# Patient Record
Sex: Male | Born: 1960
Health system: Southern US, Community
[De-identification: ages and names within clinical notes are randomized; demographics above are authoritative.]

## PROBLEM LIST (undated history)

## (undated) DIAGNOSIS — C801 Malignant (primary) neoplasm, unspecified: Secondary | ICD-10-CM

## (undated) DIAGNOSIS — I82409 Acute embolism and thrombosis of unspecified deep veins of unspecified lower extremity: Secondary | ICD-10-CM

## (undated) DIAGNOSIS — Z86718 Personal history of other venous thrombosis and embolism: Secondary | ICD-10-CM

## (undated) DIAGNOSIS — I10 Essential (primary) hypertension: Secondary | ICD-10-CM

## (undated) DIAGNOSIS — K921 Melena: Secondary | ICD-10-CM

## (undated) DIAGNOSIS — K259 Gastric ulcer, unspecified as acute or chronic, without hemorrhage or perforation: Secondary | ICD-10-CM

## (undated) DIAGNOSIS — K922 Gastrointestinal hemorrhage, unspecified: Secondary | ICD-10-CM

## (undated) HISTORY — DX: Personal history of other venous thrombosis and embolism: Z86.718

## (undated) HISTORY — DX: Gastrointestinal hemorrhage, unspecified: K92.2

---

## 1999-05-15 DIAGNOSIS — K922 Gastrointestinal hemorrhage, unspecified: Secondary | ICD-10-CM

## 1999-05-15 HISTORY — DX: Gastrointestinal hemorrhage, unspecified: K92.2

## 2002-08-08 ENCOUNTER — Encounter: Payer: Self-pay | Admitting: Emergency Medicine

## 2002-08-09 ENCOUNTER — Inpatient Hospital Stay (HOSPITAL_COMMUNITY): Admission: EM | Admit: 2002-08-09 | Discharge: 2002-08-12 | Payer: Self-pay | Admitting: Emergency Medicine

## 2002-08-10 ENCOUNTER — Encounter: Payer: Self-pay | Admitting: Internal Medicine

## 2002-08-11 ENCOUNTER — Encounter: Payer: Self-pay | Admitting: Family Medicine

## 2002-08-12 ENCOUNTER — Encounter: Payer: Self-pay | Admitting: Family Medicine

## 2002-08-27 ENCOUNTER — Encounter: Admission: RE | Admit: 2002-08-27 | Discharge: 2002-08-27 | Payer: Self-pay | Admitting: Neurology

## 2002-08-27 ENCOUNTER — Encounter: Payer: Self-pay | Admitting: Neurology

## 2009-05-14 DIAGNOSIS — I82409 Acute embolism and thrombosis of unspecified deep veins of unspecified lower extremity: Secondary | ICD-10-CM

## 2009-05-14 HISTORY — DX: Acute embolism and thrombosis of unspecified deep veins of unspecified lower extremity: I82.409

## 2009-11-18 ENCOUNTER — Ambulatory Visit: Payer: Self-pay | Admitting: Surgery

## 2009-11-18 ENCOUNTER — Ambulatory Visit: Payer: Self-pay | Admitting: Family Medicine

## 2009-11-18 ENCOUNTER — Inpatient Hospital Stay (HOSPITAL_COMMUNITY): Admission: EM | Admit: 2009-11-18 | Discharge: 2009-11-19 | Payer: Self-pay | Admitting: Emergency Medicine

## 2009-11-18 ENCOUNTER — Encounter (INDEPENDENT_AMBULATORY_CARE_PROVIDER_SITE_OTHER): Payer: Self-pay | Admitting: Family Medicine

## 2009-11-25 ENCOUNTER — Ambulatory Visit: Payer: Self-pay | Admitting: Family Medicine

## 2009-11-28 ENCOUNTER — Ambulatory Visit: Payer: Self-pay | Admitting: Family Medicine

## 2009-12-01 ENCOUNTER — Ambulatory Visit: Payer: Self-pay | Admitting: Family Medicine

## 2009-12-05 ENCOUNTER — Ambulatory Visit: Payer: Self-pay | Admitting: Family Medicine

## 2009-12-08 ENCOUNTER — Ambulatory Visit: Payer: Self-pay | Admitting: Internal Medicine

## 2009-12-12 ENCOUNTER — Ambulatory Visit: Payer: Self-pay | Admitting: Family Medicine

## 2009-12-19 ENCOUNTER — Ambulatory Visit: Payer: Self-pay | Admitting: Family Medicine

## 2009-12-26 ENCOUNTER — Ambulatory Visit: Payer: Self-pay | Admitting: Family Medicine

## 2010-01-05 ENCOUNTER — Ambulatory Visit: Payer: Self-pay | Admitting: Family Medicine

## 2010-01-19 ENCOUNTER — Ambulatory Visit: Payer: Self-pay | Admitting: Family Medicine

## 2010-01-19 LAB — CONVERTED CEMR LAB
INR: 2.49 — ABNORMAL HIGH (ref <1.50)
Prothrombin Time: 27 s — ABNORMAL HIGH (ref 11.6–15.2)

## 2010-02-09 ENCOUNTER — Encounter (INDEPENDENT_AMBULATORY_CARE_PROVIDER_SITE_OTHER): Payer: Self-pay | Admitting: Family Medicine

## 2010-02-09 LAB — CONVERTED CEMR LAB
INR: 2.81 — ABNORMAL HIGH (ref <1.50)
Prothrombin Time: 29.7 s — ABNORMAL HIGH (ref 11.6–15.2)

## 2010-03-09 ENCOUNTER — Encounter (INDEPENDENT_AMBULATORY_CARE_PROVIDER_SITE_OTHER): Payer: Self-pay | Admitting: Family Medicine

## 2010-03-09 LAB — CONVERTED CEMR LAB
INR: 2.57 — ABNORMAL HIGH (ref <1.50)
Prothrombin Time: 27.7 s — ABNORMAL HIGH (ref 11.6–15.2)

## 2010-04-12 ENCOUNTER — Inpatient Hospital Stay (HOSPITAL_COMMUNITY)
Admission: EM | Admit: 2010-04-12 | Discharge: 2010-04-14 | Payer: Self-pay | Source: Home / Self Care | Admitting: Emergency Medicine

## 2010-04-12 ENCOUNTER — Emergency Department (HOSPITAL_COMMUNITY)
Admission: EM | Admit: 2010-04-12 | Discharge: 2010-04-12 | Disposition: A | Discharge status: Other Acute Inpatient Hospital | Payer: Self-pay | Source: Home / Self Care | Admitting: Family Medicine

## 2010-07-11 ENCOUNTER — Encounter (INDEPENDENT_AMBULATORY_CARE_PROVIDER_SITE_OTHER): Payer: Self-pay | Admitting: Family Medicine

## 2010-07-11 LAB — CONVERTED CEMR LAB
ALT: 11 units/L (ref 0–53)
Albumin: 4.2 g/dL (ref 3.5–5.2)
Basophils Absolute: 0 10*3/uL (ref 0.0–0.1)
CO2: 26 meq/L (ref 19–32)
Chloride: 100 meq/L (ref 96–112)
Cholesterol: 207 mg/dL — ABNORMAL HIGH (ref 0–200)
Eosinophils Relative: 2 % (ref 0–5)
Lymphocytes Relative: 34 % (ref 12–46)
Lymphs Abs: 3.4 10*3/uL (ref 0.7–4.0)
Neutro Abs: 5.8 10*3/uL (ref 1.7–7.7)
Neutrophils Relative %: 57 % (ref 43–77)
Platelets: 383 10*3/uL (ref 150–400)
Potassium: 4.8 meq/L (ref 3.5–5.3)
RBC: 6.04 M/uL — ABNORMAL HIGH (ref 4.22–5.81)
RDW: 17.9 % — ABNORMAL HIGH (ref 11.5–15.5)
Sodium: 138 meq/L (ref 135–145)
Total Bilirubin: 0.4 mg/dL (ref 0.3–1.2)
Total Protein: 7.7 g/dL (ref 6.0–8.3)
VLDL: 17 mg/dL (ref 0–40)
WBC: 10.1 10*3/uL (ref 4.0–10.5)

## 2010-07-24 LAB — COMPREHENSIVE METABOLIC PANEL
ALT: 34 U/L (ref 0–53)
AST: 68 U/L — ABNORMAL HIGH (ref 0–37)
Albumin: 3.7 g/dL (ref 3.5–5.2)
CO2: 26 mEq/L (ref 19–32)
Calcium: 9.3 mg/dL (ref 8.4–10.5)
Chloride: 101 mEq/L (ref 96–112)
GFR calc Af Amer: >60 mL/min (ref >60)
GFR calc non Af Amer: >60 mL/min (ref >60)
Sodium: 137 mEq/L (ref 135–145)

## 2010-07-24 LAB — DIFFERENTIAL
Basophils Relative: 0 % (ref 0–1)
Eosinophils Absolute: 0.1 10*3/uL (ref 0.0–0.7)
Eosinophils Relative: 2 % (ref 0–5)
Lymphocytes Relative: 40 % (ref 12–46)
Neutro Abs: 3.8 10*3/uL (ref 1.7–7.7)

## 2010-07-24 LAB — CBC
Hemoglobin: 9.4 g/dL — ABNORMAL LOW (ref 13.0–17.0)
MCHC: 35.6 g/dL (ref 30.0–36.0)
Platelets: 140 10*3/uL — ABNORMAL LOW (ref 150–400)
RBC: 2.91 MIL/uL — ABNORMAL LOW (ref 4.22–5.81)

## 2010-07-24 LAB — PROTIME-INR
INR: 2.01 — ABNORMAL HIGH (ref 0.00–1.49)
Prothrombin Time: 22.9 seconds — ABNORMAL HIGH (ref 11.6–15.2)

## 2010-07-24 LAB — PREPARE RBC (CROSSMATCH)

## 2010-07-24 LAB — TSH: TSH: 1.348 u[IU]/mL (ref 0.350–4.500)

## 2010-07-25 LAB — DIFFERENTIAL
Basophils Absolute: 0 10*3/uL (ref 0.0–0.1)
Basophils Relative: 0 % (ref 0–1)
Eosinophils Absolute: 0.1 10*3/uL (ref 0.0–0.7)
Lymphocytes Relative: 46 % (ref 12–46)
Monocytes Relative: 3 % (ref 3–12)
Neutro Abs: 3.4 10*3/uL (ref 1.7–7.7)
Neutrophils Relative %: 50 % (ref 43–77)

## 2010-07-25 LAB — TYPE AND SCREEN
Antibody Screen: NEGATIVE
Unit division: 0

## 2010-07-25 LAB — BASIC METABOLIC PANEL
Chloride: 106 mEq/L (ref 96–112)
GFR calc Af Amer: >60 mL/min (ref >60)
GFR calc non Af Amer: >60 mL/min (ref >60)
Potassium: 3.8 mEq/L (ref 3.5–5.1)
Sodium: 135 mEq/L (ref 135–145)

## 2010-07-25 LAB — FOLATE: Folate: 18.6 ng/mL

## 2010-07-25 LAB — CBC
HCT: 15.8 % — ABNORMAL LOW (ref 39.0–52.0)
MCV: 102.6 fL — ABNORMAL HIGH (ref 78.0–100.0)
RBC: 1.54 MIL/uL — ABNORMAL LOW (ref 4.22–5.81)
RDW: 23.2 % — ABNORMAL HIGH (ref 11.5–15.5)
WBC: 6.8 10*3/uL (ref 4.0–10.5)

## 2010-07-25 LAB — RETICULOCYTES
RBC.: 1.54 MIL/uL — ABNORMAL LOW (ref 4.22–5.81)
Retic Count, Absolute: 4.6 10*3/uL — ABNORMAL LOW (ref 19.0–186.0)

## 2010-07-25 LAB — POCT I-STAT, CHEM 8
BUN: 10 mg/dL (ref 6–23)
Calcium, Ion: 1.19 mmol/L (ref 1.12–1.32)
Chloride: 105 mEq/L (ref 96–112)
HCT: 23 % — ABNORMAL LOW (ref 39.0–52.0)
Potassium: 4.4 mEq/L (ref 3.5–5.1)
Sodium: 139 mEq/L (ref 135–145)

## 2010-07-25 LAB — IRON AND TIBC
Saturation Ratios: 53 % (ref 20–55)
TIBC: 173 ug/dL — ABNORMAL LOW (ref 215–435)
UIBC: 81 ug/dL

## 2010-07-25 LAB — PROTIME-INR: Prothrombin Time: 29.7 seconds — ABNORMAL HIGH (ref 11.6–15.2)

## 2010-07-25 LAB — POCT CARDIAC MARKERS: Troponin i, poc: <0.05 ng/mL (ref 0.00–0.09)

## 2010-07-25 LAB — HEMOCCULT GUIAC POC 1CARD (OFFICE): Fecal Occult Bld: NEGATIVE

## 2010-07-25 LAB — VITAMIN B12: Vitamin B-12: 51 pg/mL — ABNORMAL LOW (ref 211–911)

## 2010-07-30 LAB — DIFFERENTIAL
Basophils Absolute: 0 10*3/uL (ref 0.0–0.1)
Basophils Absolute: 0 10*3/uL (ref 0.0–0.1)
Basophils Relative: 0 % (ref 0–1)
Basophils Relative: 0 % (ref 0–1)
Eosinophils Absolute: 0.4 10*3/uL (ref 0.0–0.7)
Eosinophils Relative: 5 % (ref 0–5)
Lymphocytes Relative: 37 % (ref 12–46)
Lymphocytes Relative: 39 % (ref 12–46)
Monocytes Absolute: 0.2 10*3/uL (ref 0.1–1.0)
Neutrophils Relative %: 56 % (ref 43–77)

## 2010-07-30 LAB — CBC
HCT: 30.3 % — ABNORMAL LOW (ref 39.0–52.0)
Hemoglobin: 10 g/dL — ABNORMAL LOW (ref 13.0–17.0)
Platelets: 207 10*3/uL (ref 150–400)
RBC: 2.42 MIL/uL — ABNORMAL LOW (ref 4.22–5.81)
RBC: 2.42 MIL/uL — ABNORMAL LOW (ref 4.22–5.81)
RDW: 20.3 % — ABNORMAL HIGH (ref 11.5–15.5)
WBC: 9.1 10*3/uL (ref 4.0–10.5)
WBC: 9.1 10*3/uL (ref 4.0–10.5)

## 2010-07-30 LAB — BASIC METABOLIC PANEL
BUN: 5 mg/dL — ABNORMAL LOW (ref 6–23)
Chloride: 106 mEq/L (ref 96–112)
GFR calc Af Amer: >60 mL/min (ref >60)
GFR calc non Af Amer: >60 mL/min (ref >60)
Potassium: 3.8 mEq/L (ref 3.5–5.1)
Sodium: 141 mEq/L (ref 135–145)

## 2010-07-30 LAB — BETA-2-GLYCOPROTEIN I ABS, IGG/M/A
Beta-2 Glyco I IgG: 0 G Units (ref <20)
Beta-2-Glycoprotein I IgM: 13 M Units (ref <20)

## 2010-07-30 LAB — COMPREHENSIVE METABOLIC PANEL
AST: 19 U/L (ref 0–37)
Albumin: 3.7 g/dL (ref 3.5–5.2)
Alkaline Phosphatase: 50 U/L (ref 39–117)
Chloride: 101 mEq/L (ref 96–112)
GFR calc Af Amer: >60 mL/min (ref >60)
Potassium: 3.7 mEq/L (ref 3.5–5.1)
Total Bilirubin: 1.4 mg/dL — ABNORMAL HIGH (ref 0.3–1.2)

## 2010-07-30 LAB — LUPUS ANTICOAGULANT PANEL
DRVVT: 42.1 secs (ref 36.2–44.3)
PTT Lupus Anticoagulant: 45.1 secs (ref 30.0–45.6)

## 2010-07-30 LAB — PROTIME-INR
INR: 1.22 (ref 0.00–1.49)
Prothrombin Time: 15.1 seconds (ref 11.6–15.2)
Prothrombin Time: 15.3 seconds — ABNORMAL HIGH (ref 11.6–15.2)

## 2010-07-30 LAB — MAGNESIUM: Magnesium: 2.2 mg/dL (ref 1.5–2.5)

## 2010-07-30 LAB — THROMBIN TIME: Thrombin Time: 20 (ref 16–23)

## 2010-07-30 LAB — APTT: aPTT: 36 seconds (ref 24–37)

## 2010-07-30 LAB — PROTEIN C ACTIVITY: Protein C Activity: 61 % — ABNORMAL LOW (ref 75–133)

## 2010-09-29 NOTE — Consult Note (Signed)
Ryan Collins, Ryan Collins                           ACCOUNT NO.:  [id number]   MEDICAL RECORD NO.:  [id number]                   PATIENT TYPE:  INP   LOCATION:  5504                                 FACILITY:  [facility]   PHYSICIAN:  Genene Churn. Cyndie Chime, M.D.          DATE OF BIRTH:  Sep 09, 1960   DATE OF CONSULTATION:  08/11/2002  DATE OF DISCHARGE:                                   CONSULTATION   This is a hematology consultation to evaluate this 50 year old man for  severe macrocytic anemia.   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  He has history of heavy alcohol use drinking about six 40 ounce bottles of  beer daily until very recently.  He presented with a three-week history of  abdominal pain and paresthesias of his extremities.  Apparently, he was  prescribed ibuprofen 800 mg t.i.d. as an outpatient.  He then presented to  the emergency department with weakness and lightheadedness and was found to  have a severe anemia with hemoglobin of 4 with associated macrocytic indices  with MCV of 119.  White count normal at 6300 with 40% neutrophils, 58%  lymphocytes and platelet count moderately decreased at 97% done on 08/09/02.  He was admitted for further evaluation.   Additional testing done during this admission included upper endoscopy by  Dr. Yancey Flemings done 08/10/02 which was normal.  No evidence for atrophic  gastritis.   He was transfused up to a stable hemoglobin.  Additional laboratory testing  was done and revealed B12 deficiency with a B12 level of 91 (211/911) with a  normal folic acid of 95.6 (normal greater than 5.4).  Iron studies with  serum iron of 147, ferratin of 368.  Urinalysis showed no blood or bilirubin  and no protein.  Blood type is A RH positive.  Antibody screen was negative.  Hepatitis B and C were not detected.  Blood chemistry profile remarkable for  mild elevation of bilirubin at 1.7 with normal transaminases, SGOT 35,  SGPT  16 and oddly decreased alkaline phosphatase at 21 (this is usually up with  pernicious anemia).   An abdominal ultrasound was done which shows a normal size liver and spleen.   Review of the peripheral blood film reveals a normochromic macrocytic  anemia.  There are occasional hypersegmented neutrophils.  There is mild  thrombocytopenia.   PAST MEDICAL HISTORY:  Essentially unremarkable.  He has had previous  surgery on his left knee, tonsillectomy as a child.  No other medical or  surgical illness.  No chronic medications.   FAMILY HISTORY:  He has three brothers and three sisters and both his  parents who are all healthy.  No blood disorders in the family as far as he  is aware.   SOCIAL HISTORY:  He is not married.  Alcohol use as noted above.  He does  not smoke.  He  works in his Control and instrumentation engineer.  No exposure to  toxic chemicals.   PHYSICAL EXAMINATION:  GENERAL:  Pleasant African-American man in no  distress.  SKIN:  Hair and nails are normal.  HEENT:  Pupils equal, reactive to light. Pharynx without erythema or  exudate.  LUNGS:  Clear. Resonant to percussion.  CARDIAC:  Regular cardiac rhythm without murmur.  NECK:  No lymphadenopathy.  ABDOMEN:  Soft.  Nontender.  No mass.  No organomegaly.  EXTREMITIES:  No edema.  NEUROLOGIC:  Motor strength is 5/5.  I did not check coordination or  vibration.   IMPRESSION:  Pernicious anemia in a young man.   This is most likely related to poor dietary habits combined with significant  alcohol use.  No evidence of atrophic gastritis at time of endoscopy the  other day.   RECOMMENDATIONS:  B12 replacement.  I would give 1 mg IM daily x7 days,  weekly x1 month, then monthly on a chronic basis.   With better diet and decreased alcohol intake, he may be able to maintain  B12 levels with just oral vitamin supplementation. I will check for  antiparietal cell and anti-intrinsic factor antibodies to exclude an  immune  etiology for his pernicious anemia.   Thank you for this consultation.                                               Genene Churn. Cyndie Chime, M.D.    Lottie Rater  D:  08/11/2002  T:  08/11/2002  Job:  [id number]   cc:   Wilhemina Bonito. Marina Goodell, M.D. [facility]   Renaye Rakers, M.D.  [phone] [address], [address]  [place]  [place] [address]  Fax: [phone]

## 2010-09-29 NOTE — H&P (Signed)
NAMEJAMIEL, Ryan Collins                           ACCOUNT NO.:  [id number]   MEDICAL RECORD NO.:  [id number]                   PATIENT TYPE:  INP   LOCATION:  1824                                 FACILITY:  [facility]   PHYSICIAN:  Stanley C. Andrey Campanile, M.D.             DATE OF BIRTH:  1961-03-29   DATE OF ADMISSION:  08/08/2002  DATE OF DISCHARGE:                                HISTORY & PHYSICAL   PRESENTING HISTORY:  This 50 year old male was admitted through the  emergency room with a hemoglobin of 4.  He had already been evaluated and  started on his first unit of packed cells.  His history was that he had been  drinking two quarts or more of malt liquor per day up until the end of  January, at which time he stopped.  He had not been eating appropriately and  began having mid-back sharp pains for which he was taking [employer]ayer Back Pain.  When the pain persisted, he, three weeks ago, saw Dr. Bruna Potter because of  lightheadedness and some paresthesias in his hands.  He was given 800 mg of  ibuprofen three times a day, along with Meclizine.  Because of persistent  symptoms, he ended up, after going to an [facility], being sent to the  emergency room.  His BMET was normal.  His CK was normal.  Chest x-ray  showed some mild bronchitic changes and an EKG was unremarkable.   ALLERGIES:  None.   MEDICATIONS:  As above.   FAMILY HISTORY:  Noncontributory.   PAST MEDICAL HISTORY:  <HOSPITALIZATIONS AND OPERATIONS/>  Left lower leg fracture with surgery in 1990.   SOCIAL HISTORY:  No tobacco.  He does concrete work.  He is married and the  wife did confirm that he was using alcohol heavily until two months ago.   REVIEW OF SYSTEMS:  ENT:  No complaints.  CARDIORESPIRATORY:  No shortness  of breath or anterior chest pain.  GI:  He denies dark stools, melena or  blood in stools.  He has no focal GI symptoms, although apparently had been  having epigastric gas and decreased appetite for some time.   NEUROLOGIC:  No  focal neurologic history or headache.   PHYSICAL EXAMINATION:  VITAL SIGNS:  Temperature 100, pulse 120,  respirations 18, blood pressure 148/66.  GENERAL:  In general, he is alert, conversant and does not appear in acute  distress.  ENT:  Throat normal.  Pale conjunctivae.  NECK:  No neck masses, bruits or nodes.  CARDIORESPIRATORY:  There is a grade 1/6 right second intercostal space  systolic murmur which is non-transmitted.  LUNGS:  Lungs are clear.  No rales or rhonchi were noted.  ABDOMEN:  Abdomen soft and nontender.  Bowel sounds are normal.  No  organomegaly or masses.  RECTAL:  There is no stool in the vault.  Prostate is normal.  NEUROLOGIC:  No  focal abnormality.   IMPRESSION ON ADMISSION:  Profound anemia probably secondary to aspirin,  nonsteroidal anti-inflammatory use and alcohol.   PLAN:  Admit to telemetry, although he will be held in the emergency room  for his first two units of blood, which are packed cells.  He was somewhat  macrocytic, so we added MVI to his vitamin and will draw a B12 and folate.  He will require appropriate workup in the hospital.                                                Duffy Rhody C. Andrey Campanile, M.D.    SCW/MEDQ  D:  08/09/2002  T:  08/10/2002  Job:  [id number]

## 2010-09-29 NOTE — Discharge Summary (Signed)
Ryan Collins, Ryan Collins                           ACCOUNT NO.:  [id number]   MEDICAL RECORD NO.:  [id number]                   PATIENT TYPE:  INP   LOCATION:  5504                                 FACILITY:  [facility]   PHYSICIAN:  Stanley C. Andrey Campanile, M.D.             DATE OF BIRTH:  10-30-1960   DATE OF ADMISSION:  08/08/2002  DATE OF DISCHARGE:  08/12/2002                                 DISCHARGE SUMMARY   PRESENTING HISTORY:  The patient is a 50 year old male admitted to the  emergency room with an interesting history.  He had apparently been drinking  heavily for several months up until the end of January when he stopped using  alcohol.  He was drinking at least 2 quarts of malt liquor per day.  He had  terribly poor nutrition according to his wife and family and because of some  mid-upper thoracic T3-T5 back pain and dizziness saw Dr. Bruna Potter.  He was  placed on ibuprofen 800 mg three times daily and meclizine.  When the  symptoms persisted and with dizziness and fatigue, he presented to [facility] and was admitted in the middle of the night as an unassigned patient  when he was found to have a hemoglobin of 4.  He was admitted and transfused  with 4 units of packed cells over a 24-hour period.   PAST MEDICAL HISTORY:   ALLERGIES:  None.   MEDICATIONS:  Had been on ibuprofen 800 every 8 hours and meclizine.   FAMILY HISTORY:  Mother has high blood pressure and history of breast  cancer.  He has three brothers and three sisters who are alive and well.   PAST HOSPITALIZATIONS:  Left lower leg fracture with surgery approximately  10 years prior.   SOCIAL HISTORY:  No tobacco.  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  Alcohol history is detailed above.   REVIEW OF SYSTEMS PERTINENT POSITIVES:  Really nothing remarkable except the  fact that he denied tarry stools, melena, emesis, blood in stool or focal GI  symptoms.   PHYSICAL EXAMINATION ON ADMISSION:  VITAL SIGNS:  Blood  pressure 148/86,  pulse 120, respirations 18, temperature 100.  GENERAL:  He was alert and oriented in no acute distress, lying on the  stretcher.  ENT:  Muddy conjunctivae without other abnormalities.  CARDIORESPIRATORY:  Grade 1/6 right second intercostal systolic murmur  without radiation; no rales or rhonchi; PMI was normal.  ABDOMEN:  Soft without organomegaly or masses; no bruits.  RECTAL:  Guaiac-negative empty stool vault.  Prostate normal.  NEUROLOGIC:  Within normal limits.   IMPRESSION ON ADMISSION:  Profound anemia of undetermined etiology.   HOSPITAL COURSE:  The patient was admitted, appropriate diagnostics were  performed and GI consultation was arranged, an upper endoscopy was normal.   It was felt with elliptocytes on his peripheral smear and macrocytic changes  that this could be  a cause.  His folate level was normal but B12 was 91  which was low, his retic count was low, the remainder of his electrolytes  and studies were normal.  His hemoglobin came up to 8.9.  He was feeling  well, his pulse rate came down to normal, blood pressure was normal and  thoracic MRI was done for his back pain which showed some marrow changes but  nothing focal; no compression fracture or evidence of tumor.  A chest CT was  performed which showed minimal bibasilar atelectasis or effusion which was  probably related to bed rest and the fact that he had received four  transfusions.  His EKG and chest x-ray did not show acute abnormality and it  was felt appropriate to discontinue his IV.  He was up and around eating  well and having no symptomatology.  He had no shortness of breath or other  problem.   The patient reassured me that Dr. Bruna Potter could administer his B12.  He had  been seen in consultation by Dr. Darnelle Catalan who felt the combination of  alcohol and nutrition most likely were the cause but this would need to be  monitored to determine whether he needs the B12 long-term or not.   He  recommended daily B12 x1 week 1000 mcg IM then weekly per month then monthly  thereafter til time passed to find if he resumed alcohol and if his  nutrition improved.   All instructions were given verbally and written to the patient for  appropriate followup with his regular physician.  He knows and it was  emphasized the importance of regular B12 injections and followup with a  followup in his blood count.   DISCHARGE DIAGNOSES:  1. Profound anemia due to alcoholism and nutrition in a patient who has     stopped drinking within the past 8 weeks.  2. Mild midthoracic back pain of undetermined etiology.   DISCHARGE MEDICATIONS:  The only medication the patient requires is 1000 mcg  of vitamin B12 daily initially then weekly for a month then monthly  thereafter and appropriate medical followup.   CONDITION ON DISCHARGE:  Improved.   COMPLICATIONS:  None.                                               Stanley C. Andrey Campanile, M.D.    SCW/MEDQ  D:  08/12/2002  T:  08/13/2002  Job:  [id number]   cc:   Dr. Bruna Potter

## 2017-05-14 DIAGNOSIS — K921 Melena: Secondary | ICD-10-CM

## 2017-05-14 HISTORY — DX: Melena: K92.1

## 2017-05-15 ENCOUNTER — Encounter (HOSPITAL_COMMUNITY): Payer: Self-pay | Admitting: Emergency Medicine

## 2017-05-15 ENCOUNTER — Ambulatory Visit (HOSPITAL_COMMUNITY)
Admission: EM | Admit: 2017-05-15 | Discharge: 2017-05-15 | Disposition: A | Discharge status: Home / Self Care | Payer: BLUE CROSS/BLUE SHIELD | Attending: Family Medicine | Admitting: Family Medicine

## 2017-05-15 ENCOUNTER — Other Ambulatory Visit: Payer: Self-pay

## 2017-05-15 DIAGNOSIS — R1013 Epigastric pain: Secondary | ICD-10-CM

## 2017-05-15 DIAGNOSIS — R109 Unspecified abdominal pain: Secondary | ICD-10-CM

## 2017-05-15 MED ORDER — RANITIDINE HCL 150 MG PO TABS: 150.0000 mg | ORAL_TABLET | Freq: Two times a day (BID) | ORAL | 0 refills | Status: DC

## 2017-05-15 NOTE — ED Triage Notes (Signed)
Abdominal pain for a month, intermittent.  Notices pain when he eats.  no vomiting and no diarrhea

## 2017-05-15 NOTE — ED Provider Notes (Signed)
  [name]   [id number] 05/15/17 Arrival Time: 1017  ASSESSMENT & PLAN:  1. Abdominal discomfort   2. Dyspepsia     Meds ordered this encounter  Medications  . ranitidine (ZANTAC) 150 MG tablet    Sig: Take 1 tablet (150 mg total) by mouth 2 (two) times daily.    Dispense:  60 tablet    Refill:  0   Smaller, more frequent meals. Will f/u in one week if not seeing improvement, sooner if needed.  Encouraged him to est care with a PCP; information given.  Reviewed expectations re: course of current medical issues. Questions answered. Outlined signs and symptoms indicating need for more acute intervention. Patient verbalized understanding. After Visit Summary given.   SUBJECTIVE:  Ryan Collins is a 57 y.o. male who presents with complaint of intermittent central abdominal discomfort on and off over the past few months. Describes as a sharp and dull pain. Transient and worse almost immediately after he eats a meal. Usu lasts 30-40 minutes with gradual resolution. No self treatment. Belching more. No n/v/d. Pain does not radiate. No specific alleviating factors when pain is present. Weight stable. Occasional heartburn symptoms. No CP/SOB.  Social History   Tobacco Use  Smoking Status Never Smoker   Occasional alcohol use.  History reviewed. No pertinent surgical history.  ROS: As per HPI.  OBJECTIVE:  Vitals:   05/15/17 1051  BP: (!) 151/85  Pulse: 86  Resp: 20  Temp: 98 F (36.7 C)  SpO2: 100%    General appearance: alert; no distress Lungs: clear to auscultation bilaterally Heart: regular rate and rhythm Abdomen: soft; non-distended; no tenderness; bowel sounds present; no masses or organomegaly; no guarding or rebound tenderness Back: no CVA tenderness Extremities: no edema; symmetrical with no gross deformities Skin: warm and dry Neurologic: normal gait Psychological: alert and cooperative; normal mood and affect   No Known Allergies                                       Social History   Socioeconomic History  . Marital status: Legally Separated    Spouse name: Not on file  . Number of children: Not on file  . Years of education: Not on file  . Highest education level: Not on file  Social Needs  . Financial resource strain: Not on file  . Food insecurity - worry: Not on file  . Food insecurity - inability: Not on file  . Transportation needs - medical: Not on file  . Transportation needs - non-medical: Not on file  Occupational History  . Not on file  Tobacco Use  . Smoking status: Never Smoker  Substance and Sexual Activity  . Alcohol use: Yes  . Drug use: Yes    Types: Marijuana  . Sexual activity: Not on file  Other Topics Concern  . Not on file  Social History Narrative  . Not on file   Family History  Problem Relation Age of Onset  . Hypertension Mother      Vanessa Kick, MD 05/15/17 1145

## 2017-06-03 ENCOUNTER — Inpatient Hospital Stay (HOSPITAL_COMMUNITY)
Admission: EM | Admit: 2017-06-03 | Discharge: 2017-06-06 | DRG: 375 | Disposition: A | Discharge status: Home Health | Payer: BLUE CROSS/BLUE SHIELD | Attending: Internal Medicine | Admitting: Internal Medicine

## 2017-06-03 ENCOUNTER — Encounter (HOSPITAL_COMMUNITY): Payer: Self-pay | Admitting: Emergency Medicine

## 2017-06-03 ENCOUNTER — Other Ambulatory Visit: Payer: Self-pay

## 2017-06-03 ENCOUNTER — Emergency Department (HOSPITAL_BASED_OUTPATIENT_CLINIC_OR_DEPARTMENT_OTHER)
Admit: 2017-06-03 | Discharge: 2017-06-03 | Disposition: A | Discharge status: Home / Self Care | Payer: BLUE CROSS/BLUE SHIELD | Attending: Emergency Medicine | Admitting: Emergency Medicine

## 2017-06-03 DIAGNOSIS — Z86718 Personal history of other venous thrombosis and embolism: Secondary | ICD-10-CM

## 2017-06-03 DIAGNOSIS — C161 Malignant neoplasm of fundus of stomach: Secondary | ICD-10-CM | POA: Diagnosis present

## 2017-06-03 DIAGNOSIS — D509 Iron deficiency anemia, unspecified: Secondary | ICD-10-CM | POA: Diagnosis present

## 2017-06-03 DIAGNOSIS — E876 Hypokalemia: Secondary | ICD-10-CM | POA: Diagnosis present

## 2017-06-03 DIAGNOSIS — D649 Anemia, unspecified: Secondary | ICD-10-CM

## 2017-06-03 DIAGNOSIS — C160 Malignant neoplasm of cardia: Secondary | ICD-10-CM | POA: Diagnosis not present

## 2017-06-03 DIAGNOSIS — F1010 Alcohol abuse, uncomplicated: Secondary | ICD-10-CM

## 2017-06-03 DIAGNOSIS — Z6823 Body mass index (BMI) 23.0-23.9, adult: Secondary | ICD-10-CM

## 2017-06-03 DIAGNOSIS — K921 Melena: Secondary | ICD-10-CM | POA: Diagnosis present

## 2017-06-03 DIAGNOSIS — E538 Deficiency of other specified B group vitamins: Secondary | ICD-10-CM

## 2017-06-03 DIAGNOSIS — K922 Gastrointestinal hemorrhage, unspecified: Secondary | ICD-10-CM | POA: Diagnosis present

## 2017-06-03 DIAGNOSIS — D72829 Elevated white blood cell count, unspecified: Secondary | ICD-10-CM | POA: Diagnosis present

## 2017-06-03 DIAGNOSIS — R634 Abnormal weight loss: Secondary | ICD-10-CM | POA: Diagnosis present

## 2017-06-03 DIAGNOSIS — M7989 Other specified soft tissue disorders: Secondary | ICD-10-CM

## 2017-06-03 HISTORY — DX: Gastric ulcer, unspecified as acute or chronic, without hemorrhage or perforation: K25.9

## 2017-06-03 HISTORY — DX: Acute embolism and thrombosis of unspecified deep veins of unspecified lower extremity: I82.409

## 2017-06-03 HISTORY — DX: Melena: K92.1

## 2017-06-03 LAB — BASIC METABOLIC PANEL
ANION GAP: 10 (ref 5–15)
BUN: 9 mg/dL (ref 6–20)
CO2: 25 mmol/L (ref 22–32)
Calcium: 8.1 mg/dL — ABNORMAL LOW (ref 8.9–10.3)
Chloride: 102 mmol/L (ref 101–111)
Creatinine, Ser: 0.87 mg/dL (ref 0.61–1.24)
GFR calc Af Amer: >60 mL/min (ref >60)
Glucose, Bld: 103 mg/dL — ABNORMAL HIGH (ref 65–99)
POTASSIUM: 3.4 mmol/L — AB (ref 3.5–5.1)
SODIUM: 137 mmol/L (ref 135–145)

## 2017-06-03 LAB — RETICULOCYTES
RBC.: 2.48 MIL/uL — ABNORMAL LOW (ref 4.22–5.81)
RETIC COUNT ABSOLUTE: 57 10*3/uL (ref 19.0–186.0)
Retic Ct Pct: 2.3 % (ref 0.4–3.1)

## 2017-06-03 LAB — CBC
HEMATOCRIT: 15.9 % — AB (ref 39.0–52.0)
HEMOGLOBIN: 5 g/dL — AB (ref 13.0–17.0)
MCH: 21.8 pg — ABNORMAL LOW (ref 26.0–34.0)
MCHC: 31.4 g/dL (ref 30.0–36.0)
MCV: 69.4 fL — AB (ref 78.0–100.0)
Platelets: 662 10*3/uL — ABNORMAL HIGH (ref 150–400)
RBC: 2.29 MIL/uL — AB (ref 4.22–5.81)
RDW: 16.6 % — ABNORMAL HIGH (ref 11.5–15.5)
WBC: 14.7 10*3/uL — AB (ref 4.0–10.5)

## 2017-06-03 LAB — VITAMIN B12: Vitamin B-12: 150 pg/mL — ABNORMAL LOW (ref 180–914)

## 2017-06-03 LAB — HEPATIC FUNCTION PANEL
ALBUMIN: 2.9 g/dL — AB (ref 3.5–5.0)
ALT: 8 U/L — AB (ref 17–63)
AST: 15 U/L (ref 15–41)
Alkaline Phosphatase: 49 U/L (ref 38–126)
Bilirubin, Direct: <0.1 mg/dL — ABNORMAL LOW (ref 0.1–0.5)
TOTAL PROTEIN: 5.8 g/dL — AB (ref 6.5–8.1)
Total Bilirubin: 0.3 mg/dL (ref 0.3–1.2)

## 2017-06-03 LAB — BRAIN NATRIURETIC PEPTIDE: B Natriuretic Peptide: 171.5 pg/mL — ABNORMAL HIGH (ref 0.0–100.0)

## 2017-06-03 LAB — POC OCCULT BLOOD, ED: Fecal Occult Bld: POSITIVE — AB

## 2017-06-03 LAB — PROTIME-INR
INR: 1.2
PROTHROMBIN TIME: 15.1 s (ref 11.4–15.2)

## 2017-06-03 LAB — IRON AND TIBC
IRON: 6 ug/dL — AB (ref 45–182)
Saturation Ratios: 2 % — ABNORMAL LOW (ref 17.9–39.5)
TIBC: 266 ug/dL (ref 250–450)
UIBC: 260 ug/dL

## 2017-06-03 LAB — FOLATE: FOLATE: 11.6 ng/mL (ref >5.9)

## 2017-06-03 LAB — LIPASE, BLOOD: Lipase: 22 U/L (ref 11–51)

## 2017-06-03 LAB — PREPARE RBC (CROSSMATCH)

## 2017-06-03 LAB — FERRITIN: Ferritin: 6 ng/mL — ABNORMAL LOW (ref 24–336)

## 2017-06-03 LAB — APTT: aPTT: 32 seconds (ref 24–36)

## 2017-06-03 MED ORDER — ONDANSETRON HCL 4 MG/2ML IJ SOLN: 4.0000 mg | Freq: Four times a day (QID) | INTRAMUSCULAR | Status: DC | PRN

## 2017-06-03 MED ORDER — THIAMINE HCL 100 MG/ML IJ SOLN
100.0000 mg | Freq: Every day | INTRAMUSCULAR | Status: DC
  Administered 2017-06-04 – 2017-06-06 (×4): 100 mg via INTRAVENOUS
  Filled 2017-06-03 (×4): qty 2

## 2017-06-03 MED ORDER — CYANOCOBALAMIN 1000 MCG/ML IJ SOLN
1000.0000 ug | Freq: Once | INTRAMUSCULAR | Status: AC
  Administered 2017-06-04: 1000 ug via INTRAMUSCULAR
  Filled 2017-06-03 (×2): qty 1

## 2017-06-03 MED ORDER — PANTOPRAZOLE SODIUM 40 MG IV SOLR
40.0000 mg | Freq: Once | INTRAVENOUS | Status: AC
  Administered 2017-06-03: 40 mg via INTRAVENOUS
  Filled 2017-06-03: qty 40

## 2017-06-03 MED ORDER — SODIUM CHLORIDE 0.9 % IV SOLN
510.0000 mg | Freq: Once | INTRAVENOUS | Status: AC
  Administered 2017-06-03: 510 mg via INTRAVENOUS
  Filled 2017-06-03: qty 17

## 2017-06-03 MED ORDER — POTASSIUM CHLORIDE 10 MEQ/100ML IV SOLN
10.0000 meq | INTRAVENOUS | Status: AC
  Administered 2017-06-03 – 2017-06-04 (×4): 10 meq via INTRAVENOUS
  Filled 2017-06-03 (×3): qty 100

## 2017-06-03 MED ORDER — ACETAMINOPHEN 650 MG RE SUPP: 650.0000 mg | Freq: Four times a day (QID) | RECTAL | Status: DC | PRN

## 2017-06-03 MED ORDER — PANTOPRAZOLE SODIUM 40 MG IV SOLR: 40.0000 mg | Freq: Two times a day (BID) | INTRAVENOUS | Status: DC

## 2017-06-03 MED ORDER — SODIUM CHLORIDE 0.9 % IV SOLN
8.0000 mg/h | INTRAVENOUS | Status: DC
  Administered 2017-06-03 – 2017-06-06 (×6): 8 mg/h via INTRAVENOUS
  Filled 2017-06-03 (×11): qty 80

## 2017-06-03 MED ORDER — SODIUM CHLORIDE 0.9 % IV SOLN
Freq: Once | INTRAVENOUS | Status: AC
  Administered 2017-06-03: 17:00:00 via INTRAVENOUS

## 2017-06-03 MED ORDER — ACETAMINOPHEN 325 MG PO TABS
650.0000 mg | ORAL_TABLET | Freq: Four times a day (QID) | ORAL | Status: DC | PRN
  Administered 2017-06-06: 650 mg via ORAL
  Filled 2017-06-03: qty 2

## 2017-06-03 MED ORDER — ONDANSETRON HCL 4 MG PO TABS: 4.0000 mg | ORAL_TABLET | Freq: Four times a day (QID) | ORAL | Status: DC | PRN

## 2017-06-03 MED ORDER — FOLIC ACID 5 MG/ML IJ SOLN
1.0000 mg | Freq: Every day | INTRAMUSCULAR | Status: DC
  Administered 2017-06-04 – 2017-06-06 (×3): 1 mg via INTRAVENOUS
  Filled 2017-06-03 (×5): qty 0.2

## 2017-06-03 NOTE — Progress Notes (Signed)
*  PRELIMINARY RESULTS* Vascular Ultrasound Bilateral lower extremity venous duplex has been completed.  Preliminary findings: Chronic appearing deep vein thrombosis noted in the right popliteal vein, other visualized veins appear negative for acute deep vein thrombosis. Negative for bakers cysts bilaterally.  Ryan Collins 06/03/2017, 5:18 PM

## 2017-06-03 NOTE — H&P (Signed)
History and Physical    Ernst Cumpston IFO:[id number] DOB: [birth year] DOA: 06/03/2017  PCP: Patient, No Pcp Per  Patient coming from: Home  Chief Complaint: fatigue, dark stools   HPI: Ryan Collins is a 57 y.o. male with medical history significant of previous DVT about 8 years ago and no longer on anticoagulation who presents to the emergency department due to fatigue and dark stools.  He has noticed dark/almost black stools for the past 4 months.  He also admits to fatigue especially with exertion.  He was evaluated at urgent care on 05/15/2017 for epigastric abdominal pain and was referred to be evaluated by GI as an outpatient at that time.  He also admits to drinking 2-3 40 ounces of beer daily but has cut back significantly in the past 4 months due to his epigastric abdominal pain.  He denies any NSAID use, but does admit to Alka-seltzer which contains aspirin.  He admits to some chills but no fevers, no chest pain, or shortness of breath.  Denies any nausea, vomiting or dysuria.  ED Course: Labs were obtained which revealed anemia with hemoglobin 5.0 with ferritin 6, iron 6.  Fecal occult blood was positive.  EDP spoke with Dr. Benson Norway for GI consultation.  Review of Systems: As per HPI otherwise 10 point review of systems negative.   Past Medical History:  Diagnosis Date  . DVT (deep venous thrombosis) ([facility]) 2011  . Stomach ulcer    Clinically suspected; No EGD confirmation as of 06/03/17    History reviewed. No pertinent surgical history.   reports that  has never smoked. He does not have any smokeless tobacco history on file. He reports that he drinks alcohol. He reports that he uses drugs. Drug: Marijuana.  No Known Allergies  Family History  Problem Relation Age of Onset  . Hypertension Mother     Prior to Admission medications   Medication Sig Start Date End Date Taking? Authorizing Provider  aspirin-sod bicarb-citric acid (ALKA-SELTZER) 325 MG TBEF tablet Take 325 mg by  mouth every 6 (six) hours as needed.    [provider]  ranitidine (ZANTAC) 150 MG tablet Take 1 tablet (150 mg total) by mouth 2 (two) times daily. 05/15/17   Vanessa Kick, MD    Physical Exam: Vitals:   06/03/17 1350 06/03/17 1351 06/03/17 1615 06/03/17 1723  BP: (!) 142/81  (!) 146/82   Pulse: 80   71  Resp: 18  15 17   Temp: 98.4 F (36.9 C)     TempSrc: Oral     SpO2: 100%     Weight:  79.4 kg (175 lb)    Height:  5\' 11"  (1.803 m)      Constitutional: NAD, calm, comfortable Eyes: PERRL, lids and conjunctivae normal ENMT: Mucous membranes are moist. Posterior pharynx clear of any exudate or lesions.Normal dentition.  Neck: normal, supple, no masses, no thyromegaly Respiratory: clear to auscultation bilaterally, no wheezing, no crackles. Normal respiratory effort. No accessory muscle use.  Cardiovascular: Regular rate and rhythm, no murmurs / rubs / gallops. +1 extremity edema.  Abdomen: TTP mid-abdomen, no masses palpated. No hepatosplenomegaly. Bowel sounds positive.  Musculoskeletal: no clubbing / cyanosis. No joint deformity upper and lower extremities. Good ROM, no contractures. Normal muscle tone.  Skin: no rashes, lesions, ulcers. No induration Neurologic: CN 2-12 grossly intact. Strength equal in all 4.  Psychiatric: Normal judgment and insight. Alert and oriented x 3. Normal mood.   Labs on Admission: I have personally reviewed following  labs and imaging studies  CBC: Recent Labs  Lab 06/03/17 1357  WBC 14.7*  HGB 5.0*  HCT 15.9*  MCV 69.4*  PLT 992*   Basic Metabolic Panel: Recent Labs  Lab 06/03/17 1357  NA 137  K 3.4*  CL 102  CO2 25  GLUCOSE 103*  BUN 9  CREATININE 0.87  CALCIUM 8.1*   GFR: Estimated Creatinine Clearance: 102.2 mL/min (by C-G formula based on SCr of 0.87 mg/dL). Liver Function Tests: Recent Labs  Lab 06/03/17 1524  AST 15  ALT 8*  ALKPHOS 49  BILITOT 0.3  PROT 5.8*  ALBUMIN 2.9*   Recent Labs  Lab  06/03/17 1524  LIPASE 22   No results for input(s): AMMONIA in the last 168 hours. Coagulation Profile: Recent Labs  Lab 06/03/17 1524  INR 1.20   Cardiac Enzymes: No results for input(s): CKTOTAL, CKMB, CKMBINDEX, TROPONINI in the last 168 hours. BNP (last 3 results) No results for input(s): PROBNP in the last 8760 hours. HbA1C: No results for input(s): HGBA1C in the last 72 hours. CBG: No results for input(s): GLUCAP in the last 168 hours. Lipid Profile: No results for input(s): CHOL, HDL, LDLCALC, TRIG, CHOLHDL, LDLDIRECT in the last 72 hours. Thyroid Function Tests: No results for input(s): TSH, T4TOTAL, FREET4, T3FREE, THYROIDAB in the last 72 hours. Anemia Panel: Recent Labs    06/03/17 1524  VITAMINB12 150*  FOLATE 11.6  FERRITIN 6*  TIBC 266  IRON 6*  RETICCTPCT 2.3   Urine analysis: No results found for: COLORURINE, APPEARANCEUR, LABSPEC, PHURINE, GLUCOSEU, HGBUR, BILIRUBINUR, KETONESUR, PROTEINUR, UROBILINOGEN, NITRITE, LEUKOCYTESUR Sepsis Labs: !!!!!!!!!!!!!!!!!!!!!!!!!!!!!!!!!!!!!!!!!!!! @LABRCNTIP (procalcitonin:4,lacticidven:4) )No results found for this or any previous visit (from the past 240 hour(s)).   Radiological Exams on Admission: No results found.  EKG: Independently reviewed. Normal sinus rhythm   Assessment/Plan Principal Problem:   Melena Active Problems:   Upper GI bleed   Symptomatic anemia   Iron deficiency anemia   History of DVT (deep vein thrombosis)   Vitamin B12 deficiency   Hypokalemia   Alcohol abuse   Melena with symptomatic anemia, iron deficiency anemia  -FOBT+ -Transfuse 3 units packed red blood cells, trend CBC -GI consulted -NPO -PPI IV  -Iron IV   Hx DVT -Vascular US shows chronic DVT on right popliteal vein, negative for acute DVT  Vit B12 deficiency -Replace  Hypokalemia -Replace, check Mg   Alcohol abuse -Admits to drinking 2-3 of 40oz beer daily until 4 months ago. Has cut back significantly  since starting abdominal pain. Monitor for withdrawal.  -Folate, B1   DVT prophylaxis: SCD Code Status: Full  Family Communication: No family at bedside Disposition Plan: Pending improvement and stabilization Consults called: GI, Dr. Benson Norway   Admission status: Observation. If patient's Hgb stabilizes, EGD completed by tomorrow, could possibly discharge 1/22     Dessa Phi, DO [facility] [email or web address] Password [facility] 06/03/2017, 6:17 PM

## 2017-06-03 NOTE — ED Notes (Signed)
Pt ambulated to room from waiting room, tolerated well. 

## 2017-06-03 NOTE — ED Notes (Signed)
ED Provider at bedside. 

## 2017-06-03 NOTE — H&P (View-Only) (Signed)
Reason for Consult: Melena, severe anemia, near syncope Referring Physician: [facility]  [name] HPI: This is a 57 year old male with a history of DVT with complaints of progressive fatigue, melena, and weight loss.  He states that he noticed dark stools intermittently over the past four months.  The patient was tolerant of his symptoms, but at home he had a near syncopal episode and he felt very weak.  In the ER he was found to have an HGB at 5.0 and a MCV of 69 and thrombocytosis.  There is an associated abdominal pain that seems to be variable in position from the epigastric region to the lower mid portion of his abdomen.  He denies any issues with nausea, vomiting, or hematemesis, but he feels as if he can taste blood in his mouth.  There is no history of NSAID use, but he has a significant ETOH history until the past four months.  Previously he reports weighing 225-230 lbs, but now he is down to 175 lbs.  He cannot recall the time period for his weight loss and he only eats once per day.  The patient denies any NSAID use.  There is no prior history of a colonoscopy and there is no known family history of colon cancer.  Past Medical History:  Diagnosis Date  . DVT (deep venous thrombosis) ([place]) 2011  . Stomach ulcer    Clinically suspected; No EGD confirmation as of 06/03/17    History reviewed. No pertinent surgical history.  Family History  Problem Relation Age of Onset  . Hypertension Mother     Social History:  reports that  has never smoked. He does not have any smokeless tobacco history on file. He reports that he drinks alcohol. He reports that he uses drugs. Drug: Marijuana.  Allergies: No Known Allergies  Medications: Scheduled: Continuous:  Results for orders placed or performed during the hospital encounter of 06/03/17 (from the past 24 hour(s))  Basic metabolic panel     Status: Abnormal   Collection Time: 06/03/17  1:57 PM  Result Value Ref Range   Sodium  137 135 - 145 mmol/L   Potassium 3.4 (L) 3.5 - 5.1 mmol/L   Chloride 102 101 - 111 mmol/L   CO2 25 22 - 32 mmol/L   Glucose, Bld 103 (H) 65 - 99 mg/dL   BUN 9 6 - 20 mg/dL   Creatinine, Ser 0.87 0.61 - 1.24 mg/dL   Calcium 8.1 (L) 8.9 - 10.3 mg/dL   GFR calc non Af Amer >60 >60 mL/min   GFR calc Af Amer >60 >60 mL/min   Anion gap 10 5 - 15  CBC     Status: Abnormal   Collection Time: 06/03/17  1:57 PM  Result Value Ref Range   WBC 14.7 (H) 4.0 - 10.5 K/uL   RBC 2.29 (L) 4.22 - 5.81 MIL/uL   Hemoglobin 5.0 (LL) 13.0 - 17.0 g/dL   HCT 15.9 (L) 39.0 - 52.0 %   MCV 69.4 (L) 78.0 - 100.0 fL   MCH 21.8 (L) 26.0 - 34.0 pg   MCHC 31.4 30.0 - 36.0 g/dL   RDW 16.6 (H) 11.5 - 15.5 %   Platelets 662 (H) 150 - 400 K/uL  Brain natriuretic peptide     Status: Abnormal   Collection Time: 06/03/17  1:58 PM  Result Value Ref Range   B Natriuretic Peptide 171.5 (H) 0.0 - 100.0 pg/mL  Type and screen [place]  Status: None (Preliminary result)   Collection Time: 06/03/17  3:13 PM  Result Value Ref Range   ABO/RH(D) A POS    Antibody Screen NEG    Sample Expiration 06/06/2017    Unit Number [id number]    Blood Component Type RED CELLS,LR    Unit division 00    Status of Unit ALLOCATED    Transfusion Status OK TO TRANSFUSE    Crossmatch Result Compatible    Unit Number [id number]    Blood Component Type RED CELLS,LR    Unit division 00    Status of Unit ALLOCATED    Transfusion Status OK TO TRANSFUSE    Crossmatch Result Compatible    Unit Number [id number]    Blood Component Type RED CELLS,LR    Unit division 00    Status of Unit ALLOCATED    Transfusion Status OK TO TRANSFUSE    Crossmatch Result Compatible   Vitamin B12     Status: Abnormal   Collection Time: 06/03/17  3:24 PM  Result Value Ref Range   Vitamin B-12 150 (L) 180 - 914 pg/mL  Folate     Status: None   Collection Time: 06/03/17  3:24 PM  Result Value Ref Range   Folate 11.6 >5.9  ng/mL  Iron and TIBC     Status: Abnormal   Collection Time: 06/03/17  3:24 PM  Result Value Ref Range   Iron 6 (L) 45 - 182 ug/dL   TIBC 266 250 - 450 ug/dL   Saturation Ratios 2 (L) 17.9 - 39.5 %   UIBC 260 ug/dL  Ferritin     Status: Abnormal   Collection Time: 06/03/17  3:24 PM  Result Value Ref Range   Ferritin 6 (L) 24 - 336 ng/mL  Reticulocytes     Status: Abnormal   Collection Time: 06/03/17  3:24 PM  Result Value Ref Range   Retic Ct Pct 2.3 0.4 - 3.1 %   RBC. 2.48 (L) 4.22 - 5.81 MIL/uL   Retic Count, Absolute 57.0 19.0 - 186.0 K/uL  Protime-INR     Status: None   Collection Time: 06/03/17  3:24 PM  Result Value Ref Range   Prothrombin Time 15.1 11.4 - 15.2 seconds   INR 1.20   APTT     Status: None   Collection Time: 06/03/17  3:24 PM  Result Value Ref Range   aPTT 32 24 - 36 seconds  Hepatic function panel     Status: Abnormal   Collection Time: 06/03/17  3:24 PM  Result Value Ref Range   Total Protein 5.8 (L) 6.5 - 8.1 g/dL   Albumin 2.9 (L) 3.5 - 5.0 g/dL   AST 15 15 - 41 U/L   ALT 8 (L) 17 - 63 U/L   Alkaline Phosphatase 49 38 - 126 U/L   Total Bilirubin 0.3 0.3 - 1.2 mg/dL   Bilirubin, Direct <0.1 (L) 0.1 - 0.5 mg/dL   Indirect Bilirubin NOT CALCULATED 0.3 - 0.9 mg/dL  Lipase, blood     Status: None   Collection Time: 06/03/17  3:24 PM  Result Value Ref Range   Lipase 22 11 - 51 U/L  Prepare RBC     Status: None   Collection Time: 06/03/17  3:50 PM  Result Value Ref Range   Order Confirmation ORDER PROCESSED BY BLOOD BANK   POC occult blood, ED Provider will collect     Status: Abnormal   Collection Time: 06/03/17  4:33 PM  Result  Value Ref Range   Fecal Occult Bld POSITIVE (A) NEGATIVE     No results found.  ROS:  As stated above in the HPI otherwise negative.  Blood pressure (!) 146/82, pulse 71, temperature 98.4 F (36.9 C), temperature source Oral, resp. rate 17, height 5\' 11"  (1.803 m), weight 79.4 kg (175 lb), SpO2 100 %.    PE: Gen:  NAD, Alert and Oriented HEENT:  [place]/AT, EOMI Neck: Supple, no LAD Lungs: CTA Bilaterally CV: RRR without M/G/R ABM: Soft, NTND - no significant abdominal pain with palpation, +BS Ext: No C/C/E  Assessment/Plan: 1) Severe anemia. 2) Near syncope. 3) ABM pain. 4) Melena/heme positive stool. 5) Chronic right popliteal DVT.   The patient is severely anemic.  It appears that this is a chronic issue that has progressively worsened with his thrombocytosis and the low MCV.  The optimal plan is to perform an EGD/Colonoscopy, but he feels very weak and unable to tolerate a prep.  He is concerned about moving to the restroom to have bowel movements as he has significant fatigue from his anemia.  There is some suggestion that his bleeding may be from an upper GI source.  Plan: 1) EGD tomorrow. 2) Transfuse.  Pablo Mathurin D 06/03/2017, 6:23 PM

## 2017-06-03 NOTE — ED Notes (Signed)
Pt ambulated to restroom located bedside room, tolerated well.

## 2017-06-03 NOTE — Consult Note (Signed)
Reason for Consult: Melena, severe anemia, near syncope Referring Physician: [facility]  [name] HPI: This is a 57 year old male with a history of DVT with complaints of progressive fatigue, melena, and weight loss.  He states that he noticed dark stools intermittently over the past four months.  The patient was tolerant of his symptoms, but at home he had a near syncopal episode and he felt very weak.  In the ER he was found to have an HGB at 5.0 and a MCV of 69 and thrombocytosis.  There is an associated abdominal pain that seems to be variable in position from the epigastric region to the lower mid portion of his abdomen.  He denies any issues with nausea, vomiting, or hematemesis, but he feels as if he can taste blood in his mouth.  There is no history of NSAID use, but he has a significant ETOH history until the past four months.  Previously he reports weighing 225-230 lbs, but now he is down to 175 lbs.  He cannot recall the time period for his weight loss and he only eats once per day.  The patient denies any NSAID use.  There is no prior history of a colonoscopy and there is no known family history of colon cancer.  Past Medical History:  Diagnosis Date  . DVT (deep venous thrombosis) ([place]) 2011  . Stomach ulcer    Clinically suspected; No EGD confirmation as of 06/03/17    History reviewed. No pertinent surgical history.  Family History  Problem Relation Age of Onset  . Hypertension Mother     Social History:  reports that  has never smoked. He does not have any smokeless tobacco history on file. He reports that he drinks alcohol. He reports that he uses drugs. Drug: Marijuana.  Allergies: No Known Allergies  Medications: Scheduled: Continuous:  Results for orders placed or performed during the hospital encounter of 06/03/17 (from the past 24 hour(s))  Basic metabolic panel     Status: Abnormal   Collection Time: 06/03/17  1:57 PM  Result Value Ref Range   Sodium  137 135 - 145 mmol/L   Potassium 3.4 (L) 3.5 - 5.1 mmol/L   Chloride 102 101 - 111 mmol/L   CO2 25 22 - 32 mmol/L   Glucose, Bld 103 (H) 65 - 99 mg/dL   BUN 9 6 - 20 mg/dL   Creatinine, Ser 0.87 0.61 - 1.24 mg/dL   Calcium 8.1 (L) 8.9 - 10.3 mg/dL   GFR calc non Af Amer >60 >60 mL/min   GFR calc Af Amer >60 >60 mL/min   Anion gap 10 5 - 15  CBC     Status: Abnormal   Collection Time: 06/03/17  1:57 PM  Result Value Ref Range   WBC 14.7 (H) 4.0 - 10.5 K/uL   RBC 2.29 (L) 4.22 - 5.81 MIL/uL   Hemoglobin 5.0 (LL) 13.0 - 17.0 g/dL   HCT 15.9 (L) 39.0 - 52.0 %   MCV 69.4 (L) 78.0 - 100.0 fL   MCH 21.8 (L) 26.0 - 34.0 pg   MCHC 31.4 30.0 - 36.0 g/dL   RDW 16.6 (H) 11.5 - 15.5 %   Platelets 662 (H) 150 - 400 K/uL  Brain natriuretic peptide     Status: Abnormal   Collection Time: 06/03/17  1:58 PM  Result Value Ref Range   B Natriuretic Peptide 171.5 (H) 0.0 - 100.0 pg/mL  Type and screen [place]  Status: None (Preliminary result)   Collection Time: 06/03/17  3:13 PM  Result Value Ref Range   ABO/RH(D) A POS    Antibody Screen NEG    Sample Expiration 06/06/2017    Unit Number [id number]    Blood Component Type RED CELLS,LR    Unit division 00    Status of Unit ALLOCATED    Transfusion Status OK TO TRANSFUSE    Crossmatch Result Compatible    Unit Number [id number]    Blood Component Type RED CELLS,LR    Unit division 00    Status of Unit ALLOCATED    Transfusion Status OK TO TRANSFUSE    Crossmatch Result Compatible    Unit Number [id number]    Blood Component Type RED CELLS,LR    Unit division 00    Status of Unit ALLOCATED    Transfusion Status OK TO TRANSFUSE    Crossmatch Result Compatible   Vitamin B12     Status: Abnormal   Collection Time: 06/03/17  3:24 PM  Result Value Ref Range   Vitamin B-12 150 (L) 180 - 914 pg/mL  Folate     Status: None   Collection Time: 06/03/17  3:24 PM  Result Value Ref Range   Folate 11.6 >5.9  ng/mL  Iron and TIBC     Status: Abnormal   Collection Time: 06/03/17  3:24 PM  Result Value Ref Range   Iron 6 (L) 45 - 182 ug/dL   TIBC 266 250 - 450 ug/dL   Saturation Ratios 2 (L) 17.9 - 39.5 %   UIBC 260 ug/dL  Ferritin     Status: Abnormal   Collection Time: 06/03/17  3:24 PM  Result Value Ref Range   Ferritin 6 (L) 24 - 336 ng/mL  Reticulocytes     Status: Abnormal   Collection Time: 06/03/17  3:24 PM  Result Value Ref Range   Retic Ct Pct 2.3 0.4 - 3.1 %   RBC. 2.48 (L) 4.22 - 5.81 MIL/uL   Retic Count, Absolute 57.0 19.0 - 186.0 K/uL  Protime-INR     Status: None   Collection Time: 06/03/17  3:24 PM  Result Value Ref Range   Prothrombin Time 15.1 11.4 - 15.2 seconds   INR 1.20   APTT     Status: None   Collection Time: 06/03/17  3:24 PM  Result Value Ref Range   aPTT 32 24 - 36 seconds  Hepatic function panel     Status: Abnormal   Collection Time: 06/03/17  3:24 PM  Result Value Ref Range   Total Protein 5.8 (L) 6.5 - 8.1 g/dL   Albumin 2.9 (L) 3.5 - 5.0 g/dL   AST 15 15 - 41 U/L   ALT 8 (L) 17 - 63 U/L   Alkaline Phosphatase 49 38 - 126 U/L   Total Bilirubin 0.3 0.3 - 1.2 mg/dL   Bilirubin, Direct <0.1 (L) 0.1 - 0.5 mg/dL   Indirect Bilirubin NOT CALCULATED 0.3 - 0.9 mg/dL  Lipase, blood     Status: None   Collection Time: 06/03/17  3:24 PM  Result Value Ref Range   Lipase 22 11 - 51 U/L  Prepare RBC     Status: None   Collection Time: 06/03/17  3:50 PM  Result Value Ref Range   Order Confirmation ORDER PROCESSED BY BLOOD BANK   POC occult blood, ED Provider will collect     Status: Abnormal   Collection Time: 06/03/17  4:33 PM  Result  Value Ref Range   Fecal Occult Bld POSITIVE (A) NEGATIVE     No results found.  ROS:  As stated above in the HPI otherwise negative.  Blood pressure (!) 146/82, pulse 71, temperature 98.4 F (36.9 C), temperature source Oral, resp. rate 17, height 5\' 11"  (1.803 m), weight 79.4 kg (175 lb), SpO2 100 %.    PE: Gen:  NAD, Alert and Oriented HEENT:  [place]/AT, EOMI Neck: Supple, no LAD Lungs: CTA Bilaterally CV: RRR without M/G/R ABM: Soft, NTND - no significant abdominal pain with palpation, +BS Ext: No C/C/E  Assessment/Plan: 1) Severe anemia. 2) Near syncope. 3) ABM pain. 4) Melena/heme positive stool. 5) Chronic right popliteal DVT.   The patient is severely anemic.  It appears that this is a chronic issue that has progressively worsened with his thrombocytosis and the low MCV.  The optimal plan is to perform an EGD/Colonoscopy, but he feels very weak and unable to tolerate a prep.  He is concerned about moving to the restroom to have bowel movements as he has significant fatigue from his anemia.  There is some suggestion that his bleeding may be from an upper GI source.  Plan: 1) EGD tomorrow. 2) Transfuse.  Makiah Foye D 06/03/2017, 6:23 PM

## 2017-06-03 NOTE — ED Notes (Signed)
Notified Dr. Dayna Barker patient has hgb 5.0.  No new orders, moving patient to a room.

## 2017-06-03 NOTE — ED Triage Notes (Addendum)
Pt states syncopal episode LAST week. States he "had to handle something" which is why he waited to get checked out. Denies chest pain, denies dizziness, upon assessment his ankles have pitting edema. States no hx besides a stomach ulcer. Denies any pain anywhere. States " I did not fall when I passed out last week, I was already lying down." no acute distress.

## 2017-06-03 NOTE — ED Provider Notes (Signed)
[facility] Provider Note   CSN: [id number] Arrival date & time: 06/03/17  1315     History   Chief Complaint Chief Complaint  Patient presents with  . Loss of Consciousness  . Leg Swelling    HPI Ryan Collins is a 57 y.o. male.  HPI   Ryan Collins is a 57 y.o. male, with a history of previous blood transfusion, presenting to the ED with fatigue and dark stools.  Patient has noted dark, "almost black," stools for the past several months.  He has had worsening fatigue, especially with exertion over the past several weeks. He was seen at urgent care on May 15, 2017 due to intermittent epigastric pain and vomiting after eating.  They suspected an ulcer.  He was prescribed Zantac, however, he has not been compliant with this. Two previous tranfusions, but doesn't know why his "blood was low."  He states he used to drink three to four 40oz beers a day, but states he stopped drinking alcohol about 4 months ago. Denies use of NSAIDS. He does endorse some right lower extremity edema for the past several months.  Denies pain, increased warmth, or redness.  He does state, "last time I had swelling in my leg it was a blood clot." Denies hematochezia, shortness of breath, chest pain, nausea, diarrhea, fever/chills, current abdominal pain, hematemesis, or any other complaints.  Last food intake was yesterday afternoon.  Past Medical History:  Diagnosis Date  . DVT (deep venous thrombosis) ([place]) 2011  . Stomach ulcer    Clinically suspected; No EGD confirmation as of 06/03/17    There are no active problems to display for this patient.   History reviewed. No pertinent surgical history.     Home Medications    Prior to Admission medications   Medication Sig Start Date End Date Taking? Authorizing Provider  aspirin-sod bicarb-citric acid (ALKA-SELTZER) 325 MG TBEF tablet Take 325 mg by mouth every 6 (six) hours as needed.    [provider]  ranitidine (ZANTAC) 150 MG tablet Take 1 tablet (150 mg total) by mouth 2 (two) times daily. 05/15/17   Vanessa Kick, MD    Family History Family History  Problem Relation Age of Onset  . Hypertension Mother     Social History Social History   Tobacco Use  . Smoking status: Never Smoker  Substance Use Topics  . Alcohol use: Yes  . Drug use: Yes    Types: Marijuana     Allergies   Patient has no known allergies.   Review of Systems Review of Systems  Constitutional: Positive for fatigue. Negative for chills, diaphoresis and fever.  Respiratory: Negative for shortness of breath.   Cardiovascular: Positive for leg swelling. Negative for chest pain.  Gastrointestinal: Positive for abdominal pain and vomiting.       Dark stools  All other systems reviewed and are negative.    Physical Exam Updated Vital Signs BP (!) 142/81 (BP Location: Right Arm)   Pulse 80   Temp 98.4 F (36.9 C) (Oral)   Resp 18   Ht 5\' 11"  (1.803 m)   Wt 79.4 kg (175 lb)   SpO2 100%   BMI 24.41 kg/m   Physical Exam  Constitutional: He appears well-developed and well-nourished. No distress.  HENT:  Head: Normocephalic and atraumatic.  Eyes: Conjunctivae are normal.  Neck: Neck supple.  Cardiovascular: Normal rate, regular rhythm, normal heart sounds and intact distal pulses.  Pulmonary/Chest: Effort normal and  breath sounds normal. No respiratory distress.  Abdominal: Soft. There is no tenderness. There is no guarding.  Genitourinary: Rectal exam shows guaiac positive stool.  Genitourinary Comments: No external hemorrhoids, fissures, or lesions noted. No gross blood or stool burden. Stool does appear dark. No rectal tenderness. PA student, Lilia Pro, served as chaperone during the rectal exam.  Musculoskeletal: He exhibits edema. He exhibits no tenderness.  Some pitting lower extremity edema, noted mostly on the right.  No noted tenderness, increased warmth, or erythema.    Lymphadenopathy:    He has no cervical adenopathy.  Neurological: He is alert.  Skin: Skin is warm and dry. He is not diaphoretic.  Psychiatric: He has a normal mood and affect. His behavior is normal.  Nursing note and vitals reviewed.    ED Treatments / Results  Labs (all labs ordered are listed, but only abnormal results are displayed) Labs Reviewed  BASIC METABOLIC PANEL - Abnormal; Notable for the following components:      Result Value   Potassium 3.4 (*)    Glucose, Bld 103 (*)    Calcium 8.1 (*)    All other components within normal limits  CBC - Abnormal; Notable for the following components:   WBC 14.7 (*)    RBC 2.29 (*)    Hemoglobin 5.0 (*)    HCT 15.9 (*)    MCV 69.4 (*)    MCH 21.8 (*)    RDW 16.6 (*)    Platelets 662 (*)    All other components within normal limits  BRAIN NATRIURETIC PEPTIDE - Abnormal; Notable for the following components:   B Natriuretic Peptide 171.5 (*)    All other components within normal limits  VITAMIN B12 - Abnormal; Notable for the following components:   Vitamin B-12 150 (*)    All other components within normal limits  IRON AND TIBC - Abnormal; Notable for the following components:   Iron 6 (*)    Saturation Ratios 2 (*)    All other components within normal limits  FERRITIN - Abnormal; Notable for the following components:   Ferritin 6 (*)    All other components within normal limits  RETICULOCYTES - Abnormal; Notable for the following components:   RBC. 2.48 (*)    All other components within normal limits  HEPATIC FUNCTION PANEL - Abnormal; Notable for the following components:   Total Protein 5.8 (*)    Albumin 2.9 (*)    ALT 8 (*)    Bilirubin, Direct <0.1 (*)    All other components within normal limits  POC OCCULT BLOOD, ED - Abnormal; Notable for the following components:   Fecal Occult Bld POSITIVE (*)    All other components within normal limits  FOLATE  PROTIME-INR  APTT  LIPASE, BLOOD  TYPE AND SCREEN   PREPARE RBC (CROSSMATCH)    EKG  EKG Interpretation  Date/Time:  Monday June 03 2017 16:35:35 EST Ventricular Rate:  63 PR Interval:  150 QRS Duration: 82 QT Interval:  446 QTC Calculation: 456 R Axis:   68 Text Interpretation:  Normal sinus rhythm Normal ECG Confirmed by Milton Ferguson [phone]) on 06/03/2017 4:40:58 PM       Radiology No results found.  Procedures .Critical Care Performed by: Lorayne Bender, PA-C Authorized by: Lorayne Bender, PA-C   Critical care provider statement:    Critical care time (minutes):  35   Critical care was necessary to treat or prevent imminent or life-threatening deterioration of the following  conditions: Symptomatic anemia with transfusion.   Critical care was time spent personally by me on the following activities:  Development of treatment plan with patient or surrogate, discussions with consultants, examination of patient, obtaining history from patient or surrogate, review of old charts, ordering and review of radiographic studies, ordering and review of laboratory studies, ordering and performing treatments and interventions, pulse oximetry and re-evaluation of patient's condition   I assumed direction of critical care for this patient from another provider in my specialty: no      (including critical care time)  Medications Ordered in ED Medications  0.9 %  sodium chloride infusion ( Intravenous New Bag/Given 06/03/17 1725)  pantoprazole (PROTONIX) injection 40 mg (40 mg Intravenous Given 06/03/17 1728)     Initial Impression / Assessment and Plan / ED Course  I have reviewed the triage vital signs and the nursing notes.  Pertinent labs & imaging results that were available during my care of the patient were reviewed by me and considered in my medical decision making (see chart for details).  Clinical Course as of Jun 03 1754  Mon Jun 03, 2017  1727 Spoke with Dr. Benson Norway, gastroenterologist.  States he will come evaluate the  patient.  [SJ]  1731 Spoke with Dr. Maylene Roes, hospitalist. Agrees to admit the patient.  [SJ]    Clinical Course User Index [SJ] Joy, Shawn C, PA-C    Patient presents with fatigue and dark stools. Evidence of anemia, symptomatic. Transfusion orders placed. Admitted for suspected upper GI bleed with somatic anemia.  Findings and plan of care discussed with Milton Ferguson, MD. Dr. Roderic Palau personally evaluated and examined this patient.  Vitals:   06/03/17 1350 06/03/17 1351 06/03/17 1615 06/03/17 1723  BP: (!) 142/81  (!) 146/82   Pulse: 80   71  Resp: 18  15 17   Temp: 98.4 F (36.9 C)     TempSrc: Oral     SpO2: 100%     Weight:  79.4 kg (175 lb)    Height:  5\' 11"  (1.803 m)       Final Clinical Impressions(s) / ED Diagnoses   Final diagnoses:  Symptomatic anemia  Gastrointestinal hemorrhage, unspecified gastrointestinal hemorrhage type    ED Discharge Orders    None       Layla Maw 06/03/17 1756    Milton Ferguson, MD 06/03/17 2350

## 2017-06-03 NOTE — ED Notes (Signed)
US in room for procedure  

## 2017-06-04 ENCOUNTER — Observation Stay (HOSPITAL_COMMUNITY): Payer: BLUE CROSS/BLUE SHIELD | Admitting: Anesthesiology

## 2017-06-04 ENCOUNTER — Other Ambulatory Visit (HOSPITAL_COMMUNITY)
Admission: RE | Admit: 2017-06-04 | Discharge: 2017-06-04 | Disposition: A | Discharge status: Home / Self Care | Payer: BLUE CROSS/BLUE SHIELD | Source: Ambulatory Visit | Attending: Hematology | Admitting: Hematology

## 2017-06-04 ENCOUNTER — Encounter (HOSPITAL_COMMUNITY)
Admission: EM | Disposition: A | Discharge status: Home Health | Payer: Self-pay | Source: Home / Self Care | Attending: Internal Medicine

## 2017-06-04 ENCOUNTER — Encounter (HOSPITAL_COMMUNITY): Payer: Self-pay | Admitting: Anesthesiology

## 2017-06-04 DIAGNOSIS — C169 Malignant neoplasm of stomach, unspecified: Secondary | ICD-10-CM | POA: Insufficient documentation

## 2017-06-04 DIAGNOSIS — D649 Anemia, unspecified: Secondary | ICD-10-CM | POA: Diagnosis present

## 2017-06-04 DIAGNOSIS — K922 Gastrointestinal hemorrhage, unspecified: Secondary | ICD-10-CM | POA: Diagnosis not present

## 2017-06-04 DIAGNOSIS — Z86718 Personal history of other venous thrombosis and embolism: Secondary | ICD-10-CM | POA: Diagnosis not present

## 2017-06-04 DIAGNOSIS — R1013 Epigastric pain: Secondary | ICD-10-CM | POA: Diagnosis not present

## 2017-06-04 DIAGNOSIS — D72829 Elevated white blood cell count, unspecified: Secondary | ICD-10-CM | POA: Diagnosis present

## 2017-06-04 DIAGNOSIS — Z6823 Body mass index (BMI) 23.0-23.9, adult: Secondary | ICD-10-CM | POA: Diagnosis not present

## 2017-06-04 DIAGNOSIS — C161 Malignant neoplasm of fundus of stomach: Secondary | ICD-10-CM | POA: Diagnosis present

## 2017-06-04 DIAGNOSIS — E876 Hypokalemia: Secondary | ICD-10-CM | POA: Diagnosis present

## 2017-06-04 DIAGNOSIS — D509 Iron deficiency anemia, unspecified: Secondary | ICD-10-CM | POA: Diagnosis present

## 2017-06-04 DIAGNOSIS — C160 Malignant neoplasm of cardia: Secondary | ICD-10-CM | POA: Diagnosis present

## 2017-06-04 DIAGNOSIS — K921 Melena: Secondary | ICD-10-CM | POA: Diagnosis present

## 2017-06-04 DIAGNOSIS — D500 Iron deficiency anemia secondary to blood loss (chronic): Secondary | ICD-10-CM | POA: Diagnosis not present

## 2017-06-04 DIAGNOSIS — E538 Deficiency of other specified B group vitamins: Secondary | ICD-10-CM | POA: Diagnosis present

## 2017-06-04 DIAGNOSIS — R634 Abnormal weight loss: Secondary | ICD-10-CM | POA: Diagnosis present

## 2017-06-04 DIAGNOSIS — F1010 Alcohol abuse, uncomplicated: Secondary | ICD-10-CM | POA: Diagnosis not present

## 2017-06-04 HISTORY — PX: ESOPHAGOGASTRODUODENOSCOPY: SHX1529

## 2017-06-04 HISTORY — PX: ESOPHAGOGASTRODUODENOSCOPY: SHX5428

## 2017-06-04 LAB — BASIC METABOLIC PANEL
Anion gap: 9 (ref 5–15)
BUN: 9 mg/dL (ref 6–20)
CHLORIDE: 106 mmol/L (ref 101–111)
CO2: 21 mmol/L — AB (ref 22–32)
CREATININE: 0.86 mg/dL (ref 0.61–1.24)
Calcium: 8.2 mg/dL — ABNORMAL LOW (ref 8.9–10.3)
GFR calc Af Amer: >60 mL/min (ref >60)
GFR calc non Af Amer: >60 mL/min (ref >60)
Glucose, Bld: 92 mg/dL (ref 65–99)
Potassium: 4.1 mmol/L (ref 3.5–5.1)
Sodium: 136 mmol/L (ref 135–145)

## 2017-06-04 LAB — CBC
HEMATOCRIT: 24 % — AB (ref 39.0–52.0)
HEMOGLOBIN: 8 g/dL — AB (ref 13.0–17.0)
MCH: 25 pg — AB (ref 26.0–34.0)
MCHC: 33.3 g/dL (ref 30.0–36.0)
MCV: 75 fL — ABNORMAL LOW (ref 78.0–100.0)
Platelets: 589 10*3/uL — ABNORMAL HIGH (ref 150–400)
RBC: 3.2 MIL/uL — ABNORMAL LOW (ref 4.22–5.81)
RDW: 20.3 % — ABNORMAL HIGH (ref 11.5–15.5)
WBC: 19.8 10*3/uL — ABNORMAL HIGH (ref 4.0–10.5)

## 2017-06-04 LAB — MAGNESIUM: Magnesium: 2.2 mg/dL (ref 1.7–2.4)

## 2017-06-04 SURGERY — EGD (ESOPHAGOGASTRODUODENOSCOPY)
Anesthesia: General

## 2017-06-04 MED ORDER — IOPAMIDOL (ISOVUE-300) INJECTION 61%
INTRAVENOUS | Status: AC
  Filled 2017-06-04: qty 30

## 2017-06-04 MED ORDER — SODIUM CHLORIDE 0.9 % IV SOLN
INTRAVENOUS | Status: DC
  Administered 2017-06-04: 13:00:00 via INTRAVENOUS

## 2017-06-04 MED ORDER — FENTANYL CITRATE (PF) 100 MCG/2ML IJ SOLN
25.0000 ug | INTRAMUSCULAR | Status: DC | PRN
  Administered 2017-06-05: 50 ug via INTRAVENOUS
  Administered 2017-06-05: 25 ug via INTRAVENOUS
  Filled 2017-06-04 (×2): qty 2

## 2017-06-04 MED ORDER — DEXAMETHASONE SODIUM PHOSPHATE 10 MG/ML IJ SOLN
INTRAMUSCULAR | Status: DC | PRN
  Administered 2017-06-04: 5 mg via INTRAVENOUS

## 2017-06-04 MED ORDER — ONDANSETRON HCL 4 MG/2ML IJ SOLN
INTRAMUSCULAR | Status: AC
  Filled 2017-06-04: qty 2

## 2017-06-04 MED ORDER — ONDANSETRON HCL 4 MG/2ML IJ SOLN: 4.0000 mg | Freq: Once | INTRAMUSCULAR | Status: DC | PRN

## 2017-06-04 MED ORDER — ONDANSETRON HCL 4 MG/2ML IJ SOLN
INTRAMUSCULAR | Status: DC | PRN
  Administered 2017-06-04: 4 mg via INTRAVENOUS

## 2017-06-04 MED ORDER — POTASSIUM CHLORIDE 10 MEQ/100ML IV SOLN
INTRAVENOUS | Status: AC
  Filled 2017-06-04: qty 100

## 2017-06-04 MED ORDER — PROPOFOL 500 MG/50ML IV EMUL
INTRAVENOUS | Status: DC | PRN
  Administered 2017-06-04: 100 ug/kg/min via INTRAVENOUS

## 2017-06-04 NOTE — Anesthesia Procedure Notes (Signed)
Procedure Name: MAC Date/Time: 06/04/2017 2:12 PM Performed by: Kyung Rudd, CRNA Pre-anesthesia Checklist: Patient identified, Emergency Drugs available, Suction available and Patient being monitored Patient Re-evaluated:Patient Re-evaluated prior to induction Oxygen Delivery Method: Nasal cannula Induction Type: IV induction Placement Confirmation: positive ETCO2 Dental Injury: Teeth and Oropharynx as per pre-operative assessment

## 2017-06-04 NOTE — Interval H&P Note (Signed)
History and Physical Interval Note:  06/04/2017 2:00 PM  Ryan Collins  has presented today for surgery, with the diagnosis of Severe anemia  The various methods of treatment have been discussed with the patient and family. After consideration of risks, benefits and other options for treatment, the patient has consented to  Procedure(s): ESOPHAGOGASTRODUODENOSCOPY (EGD) (N/A) as a surgical intervention .  The patient's history has been reviewed, patient examined, no change in status, stable for surgery.  I have reviewed the patient's chart and labs.  Questions were answered to the patient's satisfaction.     [name]

## 2017-06-04 NOTE — Transfer of Care (Signed)
Immediate Anesthesia Transfer of Care Note  Patient: Ryan Collins  Procedure(s) Performed: ESOPHAGOGASTRODUODENOSCOPY (EGD) (N/A )  Patient Location: Endoscopy Unit  Anesthesia Type:General  Level of Consciousness: awake, alert  and oriented  Airway & Oxygen Therapy: Patient Spontanous Breathing and Patient connected to nasal cannula oxygen  Post-op Assessment: Report given to RN, Post -op Vital signs reviewed and stable and Patient moving all extremities  Post vital signs: Reviewed and stable  Last Vitals:  Vitals:   06/04/17 1340 06/04/17 1438  BP:  (!) 145/71  Pulse:  63  Resp:  20  Temp:  37.2 C  SpO2: 100% 100%    Last Pain:  Vitals:   06/04/17 1438  TempSrc: Oral  PainSc:          Complications: No apparent anesthesia complications

## 2017-06-04 NOTE — Anesthesia Preprocedure Evaluation (Addendum)
Anesthesia Evaluation  Patient identified by MRN, date of birth, ID band Patient awake    Reviewed: Allergy & Precautions, NPO status , Patient's Chart, lab work & pertinent test results  Airway Mallampati: II  TM Distance: >3 FB Neck ROM: Full    Dental  (+) Teeth Intact, Poor Dentition, Dental Advisory Given,    Pulmonary    breath sounds clear to auscultation       Cardiovascular  Rhythm:Regular Rate:Normal     Neuro/Psych    GI/Hepatic   Endo/Other    Renal/GU      Musculoskeletal   Abdominal   Peds  Hematology   Anesthesia Other Findings   Reproductive/Obstetrics                            Anesthesia Physical Anesthesia Plan  ASA: III  Anesthesia Plan: General   Post-op Pain Management:    Induction: Intravenous  PONV Risk Score and Plan: 1 and Ondansetron and Dexamethasone  Airway Management Planned: Nasal Cannula and Natural Airway  Additional Equipment:   Intra-op Plan:   Post-operative Plan:   Informed Consent: I have reviewed the patients History and Physical, chart, labs and discussed the procedure including the risks, benefits and alternatives for the proposed anesthesia with the patient or authorized representative who has indicated his/her understanding and acceptance.   Dental advisory given  Plan Discussed with: CRNA and Anesthesiologist  Anesthesia Plan Comments:         Anesthesia Quick Evaluation

## 2017-06-04 NOTE — Progress Notes (Signed)
Pt transport to Endoscopy via wheelchair at 1330. Pt put something in his belonging bag prior to floor departure. Pt had no personal belongings on him when he was brought to endoscopy. Pt had his IV pole infusing with protonix drip, NS saline infusion, and telemetry wires attached.

## 2017-06-04 NOTE — Progress Notes (Signed)
PROGRESS NOTE    Ryan Collins  WUJ:[id number] DOB: 1961/01/29 DOA: 06/03/2017 PCP: Patient, No Pcp Per     Brief Narrative:  Ryan Collins is a 57 y.o. male with medical history significant of previous DVT about 8 years ago and no longer on anticoagulation who presents to the emergency department due to fatigue and dark stools.  He has noticed dark/almost black stools for the past 4 months.  He also admits to fatigue especially with exertion.  He was evaluated at urgent care on 05/15/2017 for epigastric abdominal pain and was referred to be evaluated by GI as an outpatient at that time.  He also admits to drinking 2-3 40 ounces of beer daily but has cut back significantly in the past 4 months due to his epigastric abdominal pain.  He denies any NSAID use, but does admit to Alka-seltzer which contains aspirin.  He was admitted due to anemia Hgb 5 and GI was consulted.  Assessment & Plan:   Principal Problem:   Melena Active Problems:   Upper GI bleed   Symptomatic anemia   Iron deficiency anemia   History of DVT (deep vein thrombosis)   Vitamin B12 deficiency   Hypokalemia   Alcohol abuse   Melena with symptomatic anemia, iron deficiency anemia  -FOBT+ -Transfused 3 units pRBC 1/21, monitor CBC  -GI consulted -NPO -PPI IV  -Feraheme given 1/21  -EGD planned 1/22   Hx DVT -Vascular US shows chronic DVT on right popliteal vein, negative for acute DVT  Vit B12 deficiency -Replace  Alcohol abuse -Admits to drinking 2-3 of 40oz beer daily until 4 months ago. Has cut back significantly since starting abdominal pain. Monitor for withdrawal.  -Folate, B1  Leukocytosis -?Reactive -No fevers or other signs/complaints for infectious etiology -Monitor    DVT prophylaxis: SCD Code Status: Full Family Communication: No family at bedside Disposition Plan: Pending improvement and stabilization   Consultants:   GI  Procedures:   EGD planned 1/22  Antimicrobials:    Anti-infectives (From admission, onward)   None        Subjective: Patient complains of epigastric abdominal pain. Had BM this morning which was dark.   Objective: Vitals:   06/04/17 1100 06/04/17 1130 06/04/17 1145 06/04/17 1200  BP: 131/65 (!) 120/56  (!) 142/54  Pulse: (!) 51  (!) 48 (!) 56  Resp: 12  11 (!) 23  Temp:      TempSrc:      SpO2: 100% 100% 100% 100%  Weight:      Height:        Intake/Output Summary (Last 24 hours) at 06/04/2017 1224 Last data filed at 06/04/2017 1212 Gross per 24 hour  Intake 5952.83 ml  Output -  Net 5952.83 ml   Filed Weights   06/03/17 1351  Weight: 79.4 kg (175 lb)    Examination:  General exam: Appears calm and comfortable  Respiratory system: Clear to auscultation. Respiratory effort normal. Cardiovascular system: S1 & S2 heard, RRR. No JVD, murmurs, rubs, gallops or clicks. No pedal edema. Gastrointestinal system: Abdomen is nondistended, soft and TTP epigastric. No organomegaly or masses felt. Normal bowel sounds heard. Central nervous system: Alert and oriented. No focal neurological deficits. Extremities: Symmetric 5 x 5 power. Skin: No rashes, lesions or ulcers Psychiatry: Judgement and insight appear normal. Mood & affect appropriate.   Data Reviewed: I have personally reviewed following labs and imaging studies  CBC: Recent Labs  Lab 06/03/17 1357 06/04/17 0415  WBC 14.7* 19.8*  HGB 5.0* 8.0*  HCT 15.9* 24.0*  MCV 69.4* 75.0*  PLT 662* 536*   Basic Metabolic Panel: Recent Labs  Lab 06/03/17 1357 06/04/17 0415  NA 137 136  K 3.4* 4.1  CL 102 106  CO2 25 21*  GLUCOSE 103* 92  BUN 9 9  CREATININE 0.87 0.86  CALCIUM 8.1* 8.2*  MG  --  2.2   GFR: Estimated Creatinine Clearance: 103.4 mL/min (by C-G formula based on SCr of 0.86 mg/dL). Liver Function Tests: Recent Labs  Lab 06/03/17 1524  AST 15  ALT 8*  ALKPHOS 49  BILITOT 0.3  PROT 5.8*  ALBUMIN 2.9*   Recent Labs  Lab 06/03/17 1524   LIPASE 22   No results for input(s): AMMONIA in the last 168 hours. Coagulation Profile: Recent Labs  Lab 06/03/17 1524  INR 1.20   Cardiac Enzymes: No results for input(s): CKTOTAL, CKMB, CKMBINDEX, TROPONINI in the last 168 hours. BNP (last 3 results) No results for input(s): PROBNP in the last 8760 hours. HbA1C: No results for input(s): HGBA1C in the last 72 hours. CBG: No results for input(s): GLUCAP in the last 168 hours. Lipid Profile: No results for input(s): CHOL, HDL, LDLCALC, TRIG, CHOLHDL, LDLDIRECT in the last 72 hours. Thyroid Function Tests: No results for input(s): TSH, T4TOTAL, FREET4, T3FREE, THYROIDAB in the last 72 hours. Anemia Panel: Recent Labs    06/03/17 1524  VITAMINB12 150*  FOLATE 11.6  FERRITIN 6*  TIBC 266  IRON 6*  RETICCTPCT 2.3   Sepsis Labs: No results for input(s): PROCALCITON, LATICACIDVEN in the last 168 hours.  No results found for this or any previous visit (from the past 240 hour(s)).     Radiology Studies: No results found.    Scheduled Meds: . folic acid  1 mg Intravenous Daily  . [START ON 06/07/2017] pantoprazole  40 mg Intravenous Q12H  . thiamine  100 mg Intravenous Daily   Continuous Infusions: . pantoprozole (PROTONIX) infusion 8 mg/hr (06/04/17 1020)     LOS: 0 days    Time spent: 30 minutes   Dessa Phi, DO [facility] [email or web address] Password [facility] 06/04/2017, 12:24 PM

## 2017-06-04 NOTE — Anesthesia Postprocedure Evaluation (Signed)
Anesthesia Post Note  Patient: Jemell Town  Procedure(s) Performed: ESOPHAGOGASTRODUODENOSCOPY (EGD) (N/A )     Patient location during evaluation: Endoscopy Anesthesia Type: General Level of consciousness: awake and alert Pain management: pain level controlled Vital Signs Assessment: post-procedure vital signs reviewed and stable Respiratory status: spontaneous breathing, nonlabored ventilation, respiratory function stable and patient connected to nasal cannula oxygen Cardiovascular status: stable and blood pressure returned to baseline Postop Assessment: no apparent nausea or vomiting Anesthetic complications: no    Last Vitals:  Vitals:   06/04/17 1449 06/04/17 1500  BP: (!) 120/54 (!) 123/51  Pulse: (!) 59 (!) 54  Resp: 15 (!) 9  Temp:    SpO2: 100% 100%    Last Pain:  Vitals:   06/04/17 1438  TempSrc: Oral  PainSc:                  Rissie Sculley COKER

## 2017-06-04 NOTE — Op Note (Signed)
[facility] Patient Name: Ryan Collins Procedure Date : 06/04/2017 MRN: [id number] Attending MD: Carol Ada , MD Date of Birth: [birth year] CSN: [id number] Age: 57 Admit Type: Inpatient Procedure:                Upper GI endoscopy Indications:              Iron deficiency anemia, Melena, Weight loss Providers:                Carol Ada, MD, Angus Seller, Cherylynn Ridges,                            Technician Referring MD:              Medicines:                Propofol per Anesthesia Complications:            No immediate complications. Estimated Blood Loss:     Estimated blood loss was minimal. Procedure:                Pre-Anesthesia Assessment:                           - Prior to the procedure, a History and Physical                            was performed, and patient medications and                            allergies were reviewed. The patient's tolerance of                            previous anesthesia was also reviewed. The risks                            and benefits of the procedure and the sedation                            options and risks were discussed with the patient.                            All questions were answered, and informed consent                            was obtained. Prior Anticoagulants: The patient has                            taken no previous anticoagulant or antiplatelet                            agents. ASA Grade Assessment: III - A patient with                            severe systemic disease. After reviewing the risks  and benefits, the patient was deemed in                            satisfactory condition to undergo the procedure.                           - Sedation was administered by an anesthesia                            professional. Deep sedation was attained.                           After obtaining informed consent, the endoscope was                            passed under  direct vision. Throughout the                            procedure, the patient's blood pressure, pulse, and                            oxygen saturations were monitored continuously. The                            EG-[id number] ([id number]) scope was introduced through the                            mouth, and advanced to the second part of duodenum.                            The upper GI endoscopy was accomplished without                            difficulty. The patient tolerated the procedure                            well. Scope In: Scope Out: Findings:      The esophagus was normal.      A large, fungating and ulcerated, non-circumferential mass with no       bleeding and no stigmata of recent bleeding was found in the cardia and       in the gastric fundus. Biopsies were taken with a cold forceps for       histology.      The examined duodenum was normal.      A large friable fungating gastric mass involving the cardia and the       fundus was identified. The mass extended approximately 5 cm. It was       difficult to measure the width of the lesion. There was a large necrotic       and deep ulcer in the mid portion of the mass. It is estimated that it       encompasses 25% of the fundic circumfirence. Multiple cold biopsies were       obtained. Impression:               - Normal esophagus.                           -  Malignant gastric tumor in the cardia and in the                            gastric fundus. Biopsied.                           - Normal examined duodenum. Moderate Sedation:      None Recommendation:           - Return patient to hospital ward for ongoing care.                           - NPO.                           - Continue present medications.                           - Await pathology results.                           - Perform CT scan (computed tomography) of the                            chest/ABM/pelvis with contrast today.                           -  Pending the CT scan and biopsy results, consult                            Oncology and possibly Surgical Oncology. Procedure Code(s):        --- Professional ---                           [phone], Esophagogastroduodenoscopy, flexible,                            transoral; with biopsy, single or multiple Diagnosis Code(s):        --- Professional ---                           C16.0, Malignant neoplasm of cardia                           C16.1, Malignant neoplasm of fundus of stomach                           D50.9, Iron deficiency anemia, unspecified                           K92.1, Melena (includes Hematochezia)                           R63.4, Abnormal weight loss CPT copyright 2016 [employer]. All rights reserved. The codes documented in this report are preliminary and upon coder review may  be revised to meet current compliance requirements. Carol Ada, MD Carol Ada, MD 06/04/2017 2:40:57 PM This report  has been signed electronically. Number of Addenda: 0

## 2017-06-05 ENCOUNTER — Encounter (HOSPITAL_COMMUNITY): Payer: Self-pay | Admitting: General Practice

## 2017-06-05 ENCOUNTER — Inpatient Hospital Stay (HOSPITAL_COMMUNITY): Payer: BLUE CROSS/BLUE SHIELD

## 2017-06-05 DIAGNOSIS — Z86718 Personal history of other venous thrombosis and embolism: Secondary | ICD-10-CM

## 2017-06-05 DIAGNOSIS — E538 Deficiency of other specified B group vitamins: Secondary | ICD-10-CM

## 2017-06-05 DIAGNOSIS — D649 Anemia, unspecified: Secondary | ICD-10-CM

## 2017-06-05 DIAGNOSIS — E876 Hypokalemia: Secondary | ICD-10-CM

## 2017-06-05 DIAGNOSIS — C160 Malignant neoplasm of cardia: Principal | ICD-10-CM

## 2017-06-05 DIAGNOSIS — R1013 Epigastric pain: Secondary | ICD-10-CM

## 2017-06-05 DIAGNOSIS — F1010 Alcohol abuse, uncomplicated: Secondary | ICD-10-CM

## 2017-06-05 DIAGNOSIS — D500 Iron deficiency anemia secondary to blood loss (chronic): Secondary | ICD-10-CM

## 2017-06-05 DIAGNOSIS — K922 Gastrointestinal hemorrhage, unspecified: Secondary | ICD-10-CM

## 2017-06-05 DIAGNOSIS — K921 Melena: Secondary | ICD-10-CM

## 2017-06-05 LAB — CBC
HCT: 23.3 % — ABNORMAL LOW (ref 39.0–52.0)
Hemoglobin: 7.5 g/dL — ABNORMAL LOW (ref 13.0–17.0)
MCH: 24.1 pg — ABNORMAL LOW (ref 26.0–34.0)
MCHC: 32.2 g/dL (ref 30.0–36.0)
MCV: 74.9 fL — ABNORMAL LOW (ref 78.0–100.0)
PLATELETS: 570 10*3/uL — AB (ref 150–400)
RBC: 3.11 MIL/uL — AB (ref 4.22–5.81)
RDW: 20.6 % — AB (ref 11.5–15.5)
WBC: 20 10*3/uL — ABNORMAL HIGH (ref 4.0–10.5)

## 2017-06-05 LAB — BASIC METABOLIC PANEL
Anion gap: 8 (ref 5–15)
BUN: 11 mg/dL (ref 6–20)
CALCIUM: 8.2 mg/dL — AB (ref 8.9–10.3)
CO2: 21 mmol/L — ABNORMAL LOW (ref 22–32)
CREATININE: 0.97 mg/dL (ref 0.61–1.24)
Chloride: 104 mmol/L (ref 101–111)
GFR calc Af Amer: >60 mL/min (ref >60)
GLUCOSE: 92 mg/dL (ref 65–99)
Potassium: 3.8 mmol/L (ref 3.5–5.1)
SODIUM: 133 mmol/L — AB (ref 135–145)

## 2017-06-05 LAB — GLUCOSE, CAPILLARY: GLUCOSE-CAPILLARY: 89 mg/dL (ref 65–99)

## 2017-06-05 LAB — PREPARE RBC (CROSSMATCH)

## 2017-06-05 MED ORDER — SODIUM CHLORIDE 0.9 % IV SOLN: Freq: Once | INTRAVENOUS | Status: DC

## 2017-06-05 MED ORDER — ENOXAPARIN SODIUM 40 MG/0.4ML ~~LOC~~ SOLN
40.0000 mg | SUBCUTANEOUS | Status: DC
  Administered 2017-06-05: 40 mg via SUBCUTANEOUS
  Filled 2017-06-05: qty 0.4

## 2017-06-05 MED ORDER — IOPAMIDOL (ISOVUE-300) INJECTION 61%
INTRAVENOUS | Status: AC
  Administered 2017-06-05: 100 mL
  Filled 2017-06-05: qty 100

## 2017-06-05 MED ORDER — SENNOSIDES-DOCUSATE SODIUM 8.6-50 MG PO TABS
1.0000 | ORAL_TABLET | Freq: Two times a day (BID) | ORAL | Status: DC
  Administered 2017-06-05 – 2017-06-06 (×2): 1 via ORAL
  Filled 2017-06-05 (×2): qty 1

## 2017-06-05 MED ORDER — HYDROMORPHONE HCL 1 MG/ML IJ SOLN
1.0000 mg | INTRAMUSCULAR | Status: DC | PRN
  Administered 2017-06-05 – 2017-06-06 (×3): 1 mg via INTRAVENOUS
  Filled 2017-06-05 (×3): qty 1

## 2017-06-05 NOTE — Progress Notes (Addendum)
PROGRESS NOTE    Ryan Collins  ZSW:[id number] DOB: 02-06-1961 DOA: 06/03/2017 PCP: Patient, No Pcp Per     Brief Narrative:  Ryan Collins is a 57 y.o. male with medical history significant of previous DVT about 8 years ago and no longer on anticoagulation who presented to the emergency department due to fatigue, epigastric pain, and dark stools for the past 4 months. He also admits to regular alcohol use 2-3 40 ounces of beer daily but cut back significantly in the past 4 months due to his epigastric abdominal pain. Reports 20 lbs unintentional weight loss in the last 4 months. On presentation Hgb 5 and positive FOBT. GI consulted EGD done on 06/04/17 revealed malignant gastric tumor in the cardia and in the gastric fundus which were biopsied.  06/05/17: Patient seen and examined with no family member at his bedside. Reports epigastric abd pain which he reports is positional. Improved when he lays on his sides. Denies nausea or vomiting. Admits to 2 dark stools this morning.  UPDATE: 06/05/17- Spoke with Dr. Julien Nordmann, medical oncologist, on call. His office will arrange for follow up in the outpatient setting and will inform the patient of appointment. Biopsies positive for adenocarcinoma 06/05/17. CT abd/pelvis w contrast 06/05/17 please refer to radiology report.  Assessment & Plan:   Principal Problem:   Melena Active Problems:   Upper GI bleed   Symptomatic anemia   Iron deficiency anemia   History of DVT (deep vein thrombosis)   Vitamin B12 deficiency   Hypokalemia   Alcohol abuse   Melena/epigastric pain with symptomatic anemia, iron deficiency anemia, persistent in the setting of newly diagnosed malignant gastric tumor -FOBT+ -Transfused 3 units pRBC 1/21  -GI following -EGD done on 06/04/17 revealed malignant gastric tumor in the cardia and in the gastric fundus which were biopsied. Positive for adenocarcinoma 06/05/17. -continue PPI IV  -Fera heme given 1/21  -hg 7.5 from  8.0 -persistent melena; reported 2 dark stools 06/05/17 am -Transfuse 2U PRBCs -pain management with IV dilaudid prn; IV zofran for nausea; sennokot for bowel regimen. -CBC am  Newly diagnosed malignant gastric tumor/Gastric adenocarcinoma -EGD done on 06/04/17 revealed malignant gastric tumor in the cardia and in the gastric fundus which were biopsied (5 cm). -pathology positive for adenocarcinoma 06/05/17 -CT abd pelvis w contrast 06/05/17: see radiology report -Oncology contacted Dr. Julien Nordmann aware. He will schedule appointment for outpatient follow up.  Hx DVT -Vascular US shows chronic DVT on right popliteal vein, negative for acute DVT -hold oral anticoagulation due to suspected active GI bleed -high risk for DVT due to malignancy -started chemical DVT ppx sq lovenox 40 mg daily  Vit B12 deficiency -Replace as indicated  Alcohol abuse -Admits to drinking 2-3 of 40oz beer daily until 4 months ago. Has cut back significantly since starting abdominal pain. Monitor for withdrawal.  -conitnue Folate, B1  Leukocytosis -suspect Reactive -No fevers or other signs/complaints for infectious etiology -continue to Monitor    DVT prophylaxis: lovenox sq 40 mg daily Code Status: Full Family Communication: No family at bedside Disposition Plan: will stay another midnight to obtain blood transfusion in the setting of suspected GI bleed/ newly diagnosed malignant gastric tumor.  Severity: Moderate to severe due to suspected GI blood loss in the setting of newly diagnosed malignant gastric tumor.   Consultants:   GI  Medical oncology  Procedures:   EGD 1/22 with biopsies  Antimicrobials:  Anti-infectives (From admission, onward)   None  Objective: Vitals:   06/04/17 1500 06/04/17 1532 06/04/17 2126 06/05/17 0555  BP: (!) 123/51 136/68 140/78 140/62  Pulse: (!) 54 (!) 53 (!) 55 75  Resp: (!) 9 18 16 18   Temp:  98.4 F (36.9 C) 98.8 F (37.1 C) 98.7 F (37.1 C)   TempSrc:  Oral Oral Oral  SpO2: 100% 100% 100% 100%  Weight:      Height:        Intake/Output Summary (Last 24 hours) at 06/05/2017 0819 Last data filed at 06/04/2017 1439 Gross per 24 hour  Intake 887.83 ml  Output -  Net 887.83 ml   Filed Weights   06/03/17 1351 06/04/17 1339  Weight: 79.4 kg (175 lb) 77.1 kg (170 lb)    Examination: seen and examined 06/05/17. Physical exam is unchanged. General exam: Appears calm and comfortable  Respiratory system: Clear to auscultation. Respiratory effort normal. Cardiovascular system: S1 & S2 heard, RRR. No JVD, murmurs, rubs, gallops or clicks. No pedal edema. Gastrointestinal system: Abdomen is nondistended, soft and TTP epigastric. No organomegaly or masses felt. Normal bowel sounds heard. Central nervous system: Alert and oriented. No focal neurological deficits. Extremities: Symmetric 5 x 5 power. Skin: No rashes, lesions or ulcers Psychiatry: Judgement and insight appear normal. Mood & affect appropriate.   Data Reviewed: I have personally reviewed following labs and imaging studies  CBC: Recent Labs  Lab 06/03/17 1357 06/04/17 0415 06/05/17 0303  WBC 14.7* 19.8* 20.0*  HGB 5.0* 8.0* 7.5*  HCT 15.9* 24.0* 23.3*  MCV 69.4* 75.0* 74.9*  PLT 662* 589* 850*   Basic Metabolic Panel: Recent Labs  Lab 06/03/17 1357 06/04/17 0415 06/05/17 0303  NA 137 136 133*  K 3.4* 4.1 3.8  CL 102 106 104  CO2 25 21* 21*  GLUCOSE 103* 92 92  BUN 9 9 11   CREATININE 0.87 0.86 0.97  CALCIUM 8.1* 8.2* 8.2*  MG  --  2.2  --    GFR: Estimated Creatinine Clearance: 92.4 mL/min (by C-G formula based on SCr of 0.97 mg/dL). Liver Function Tests: Recent Labs  Lab 06/03/17 1524  AST 15  ALT 8*  ALKPHOS 49  BILITOT 0.3  PROT 5.8*  ALBUMIN 2.9*   Recent Labs  Lab 06/03/17 1524  LIPASE 22   No results for input(s): AMMONIA in the last 168 hours. Coagulation Profile: Recent Labs  Lab 06/03/17 1524  INR 1.20   Cardiac  Enzymes: No results for input(s): CKTOTAL, CKMB, CKMBINDEX, TROPONINI in the last 168 hours. BNP (last 3 results) No results for input(s): PROBNP in the last 8760 hours. HbA1C: No results for input(s): HGBA1C in the last 72 hours. CBG: Recent Labs  Lab 06/04/17 2128  GLUCAP 89   Lipid Profile: No results for input(s): CHOL, HDL, LDLCALC, TRIG, CHOLHDL, LDLDIRECT in the last 72 hours. Thyroid Function Tests: No results for input(s): TSH, T4TOTAL, FREET4, T3FREE, THYROIDAB in the last 72 hours. Anemia Panel: Recent Labs    06/03/17 1524  VITAMINB12 150*  FOLATE 11.6  FERRITIN 6*  TIBC 266  IRON 6*  RETICCTPCT 2.3   Sepsis Labs: No results for input(s): PROCALCITON, LATICACIDVEN in the last 168 hours.  No results found for this or any previous visit (from the past 240 hour(s)).     Radiology Studies: No results found.    Scheduled Meds: . folic acid  1 mg Intravenous Daily  . iopamidol      . [START ON 06/07/2017] pantoprazole  40 mg Intravenous Q12H  .  thiamine  100 mg Intravenous Daily   Continuous Infusions: . pantoprozole (PROTONIX) infusion 8 mg/hr (06/04/17 2250)     LOS: 1 day    Time spent: 30 minutes   Kayleen Memos, DO [facility] [email or web address] Password [facility] 06/05/2017, 8:19 AM

## 2017-06-05 NOTE — Progress Notes (Addendum)
UNASSIGNED PATIENT Subjective: Patient continues to complain of epigastric pain and mild nausea. He is aware that the biopsies of the gastric mass revealed adenocarcinoma. He continues to have some dark stools as well. He has had 2 dark bowel movements today.  Objective: Vital signs in last 24 hours: Temp:  [98.4 F (36.9 C)-99.1 F (37.3 C)] 99.1 F (37.3 C) (01/23 1326) Pulse Rate:  [51-75] 51 (01/23 1326) Resp:  [9-20] 12 (01/23 1326) BP: (120-145)/(51-78) 136/77 (01/23 1326) SpO2:  [100 %] 100 % (01/23 1326) Last BM Date: 06/05/17  Intake/Output from previous day: 01/22 0701 - 01/23 0700 In: 887.8 [I.V.:887.8] Out: -  Intake/Output this shift: No intake/output data recorded.  General appearance: cooperative, appears older than stated age, fatigued, mild distress and pale Resp: clear to auscultation bilaterally Cardio: regular rate and rhythm, S1, S2 normal, no murmur, click, rub or gallop GI: soft, with epigastric tendernes on palpation with normal bowel soundsl; no masses,  no organomegaly Extremities: extremities normal, atraumatic, no cyanosis or edema  Lab Results: Recent Labs    06/03/17 1357 06/04/17 0415 06/05/17 0303  WBC 14.7* 19.8* 20.0*  HGB 5.0* 8.0* 7.5*  HCT 15.9* 24.0* 23.3*  PLT 662* 589* 570*   BMET Recent Labs    06/03/17 1357 06/04/17 0415 06/05/17 0303  NA 137 136 133*  K 3.4* 4.1 3.8  CL 102 106 104  CO2 25 21* 21*  GLUCOSE 103* 92 92  BUN 9 9 11   CREATININE 0.87 0.86 0.97  CALCIUM 8.1* 8.2* 8.2*   LFT Recent Labs    06/03/17 1524  PROT 5.8*  ALBUMIN 2.9*  AST 15  ALT 8*  ALKPHOS 49  BILITOT 0.3  BILIDIR <0.1*  IBILI NOT CALCULATED   PT/INR Recent Labs    06/03/17 1524  LABPROT 15.1  INR 1.20   Studies/Results: Ct Chest W Contrast  Result Date: 06/05/2017 CLINICAL DATA:  Cough, mid abdominal pain, progressive fatigue, melena, weight loss. Gastric cancer, initial workup. EXAM: CT CHEST, ABDOMEN, AND PELVIS WITH  CONTRAST TECHNIQUE: Multidetector CT imaging of the chest, abdomen and pelvis was performed following the standard protocol during bolus administration of intravenous contrast. CONTRAST:  149mL ISOVUE-300 IOPAMIDOL (ISOVUE-300) INJECTION 61% COMPARISON:  None. FINDINGS: CT CHEST FINDINGS Cardiovascular: The heart is normal in size. No pericardial effusion. No evidence of thoracic aortic aneurysm. Mediastinum/Nodes: No suspicious mediastinal lymphadenopathy. Visualized thyroid is unremarkable. Lungs/Pleura: Mild centrilobular emphysematous changes, upper lobe predominant. No suspicious pulmonary nodules. Small bilateral pleural effusions, left greater than right. Mild dependent atelectasis in the bilateral lower lobes. No pneumothorax. Musculoskeletal: Degenerative changes of the lower thoracic spine. CT ABDOMEN PELVIS FINDINGS Hepatobiliary: Mildly heterogeneous perfusion of the liver. Gallbladder is unremarkable. No intrahepatic or extrahepatic ductal dilatation. Pancreas: Within normal limits. Spleen: 1.8 cm hypoenhancing lesion along the inferior spleen (series 3/image 69), indeterminate. Adrenals/Urinary Tract: Adrenal glands are within normal limits. 2.2 cm mildly complex right lower pole renal cyst with a rim calcification and possible thin septation, measuring just higher than simple fluid density, but without convincing enhancement (series 3/image 93), benign (Bosniak II). Left kidney is within normal limits. No hydronephrosis. Bladder is underdistended but unremarkable. Stomach/Bowel: Large fungating mass in the gastric cardia/posterior gastric fundus, measuring approximately 5.3 x 9.1 x 5.5 cm (series 3/image 60), likely corresponding to the patient's newly diagnosed gastric cancer. No evidence of bowel obstruction. Jejunojejunal intussusception in the right mid abdomen (series 3/image 103), likely transient/inconsequential. Appendix is not discretely visualized. Vascular/Lymphatic: No evidence of  abdominal  aortic aneurysm. No suspicious abdominopelvic lymphadenopathy. Reproductive: Prostate is grossly unremarkable. Other: Small volume pelvic ascites. Abnormal soft tissue in the left upper abdomen (series 3/image 66), reflecting extra gastric extension of tumor, including a dominant 3.5 x 4.2 cm peritoneal implant. Musculoskeletal: Mild degenerative changes of the lower lumbar spine. IMPRESSION: 9.1 cm fungating mass in the gastric cardia/posterior gastric fundus, corresponding to the patient's newly diagnosed gastric cancer. Associated extra gastric extension with peritoneal disease in the left upper abdomen, including a dominant 4.2 cm peritoneal implant. Small volume pelvic ascites. 1.8 cm hypoenhancing lesion along the inferior spleen, indeterminate. Electronically Signed   By: Julian Hy M.D.   On: 06/05/2017 09:02   Ct Abdomen Pelvis W Contrast  Result Date: 06/05/2017 CLINICAL DATA:  Cough, mid abdominal pain, progressive fatigue, melena, weight loss. Gastric cancer, initial workup. EXAM: CT CHEST, ABDOMEN, AND PELVIS WITH CONTRAST TECHNIQUE: Multidetector CT imaging of the chest, abdomen and pelvis was performed following the standard protocol during bolus administration of intravenous contrast. CONTRAST:  177mL ISOVUE-300 IOPAMIDOL (ISOVUE-300) INJECTION 61% COMPARISON:  None. FINDINGS: CT CHEST FINDINGS Cardiovascular: The heart is normal in size. No pericardial effusion. No evidence of thoracic aortic aneurysm. Mediastinum/Nodes: No suspicious mediastinal lymphadenopathy. Visualized thyroid is unremarkable. Lungs/Pleura: Mild centrilobular emphysematous changes, upper lobe predominant. No suspicious pulmonary nodules. Small bilateral pleural effusions, left greater than right. Mild dependent atelectasis in the bilateral lower lobes. No pneumothorax. Musculoskeletal: Degenerative changes of the lower thoracic spine. CT ABDOMEN PELVIS FINDINGS Hepatobiliary: Mildly heterogeneous perfusion  of the liver. Gallbladder is unremarkable. No intrahepatic or extrahepatic ductal dilatation. Pancreas: Within normal limits. Spleen: 1.8 cm hypoenhancing lesion along the inferior spleen (series 3/image 69), indeterminate. Adrenals/Urinary Tract: Adrenal glands are within normal limits. 2.2 cm mildly complex right lower pole renal cyst with a rim calcification and possible thin septation, measuring just higher than simple fluid density, but without convincing enhancement (series 3/image 93), benign (Bosniak II). Left kidney is within normal limits. No hydronephrosis. Bladder is underdistended but unremarkable. Stomach/Bowel: Large fungating mass in the gastric cardia/posterior gastric fundus, measuring approximately 5.3 x 9.1 x 5.5 cm (series 3/image 60), likely corresponding to the patient's newly diagnosed gastric cancer. No evidence of bowel obstruction. Jejunojejunal intussusception in the right mid abdomen (series 3/image 103), likely transient/inconsequential. Appendix is not discretely visualized. Vascular/Lymphatic: No evidence of abdominal aortic aneurysm. No suspicious abdominopelvic lymphadenopathy. Reproductive: Prostate is grossly unremarkable. Other: Small volume pelvic ascites. Abnormal soft tissue in the left upper abdomen (series 3/image 66), reflecting extra gastric extension of tumor, including a dominant 3.5 x 4.2 cm peritoneal implant. Musculoskeletal: Mild degenerative changes of the lower lumbar spine. IMPRESSION: 9.1 cm fungating mass in the gastric cardia/posterior gastric fundus, corresponding to the patient's newly diagnosed gastric cancer. Associated extra gastric extension with peritoneal disease in the left upper abdomen, including a dominant 4.2 cm peritoneal implant. Small volume pelvic ascites. 1.8 cm hypoenhancing lesion along the inferior spleen, indeterminate. Electronically Signed   By: Julian Hy M.D.   On: 06/05/2017 09:02   Medications: I have reviewed the patient's  current medications.  Assessment/Plan: Epigastric pain with symptomatic anemia and blood in stool and abnormal weight loss- Gastric adenocarcinoma-9 cm mass in the gastric cardia and fundus with a large 4.2 cm peritoneal implant in the LUQ indicating extragastric extension of the mass and small small of ascites. These findings indicate a very poor prognosis. Would be prudent to get oncology involved at this point to formulate further treatment plan.   LOS: 1  day   Zain Bingman 06/05/2017, 2:20 PM

## 2017-06-06 ENCOUNTER — Encounter: Payer: Self-pay | Admitting: Hematology

## 2017-06-06 ENCOUNTER — Telehealth: Payer: Self-pay | Admitting: Hematology

## 2017-06-06 DIAGNOSIS — C169 Malignant neoplasm of stomach, unspecified: Secondary | ICD-10-CM

## 2017-06-06 LAB — COMPREHENSIVE METABOLIC PANEL
ALK PHOS: 49 U/L (ref 38–126)
ALT: 9 U/L — ABNORMAL LOW (ref 17–63)
ANION GAP: 7 (ref 5–15)
AST: 14 U/L — ABNORMAL LOW (ref 15–41)
Albumin: 2.4 g/dL — ABNORMAL LOW (ref 3.5–5.0)
BILIRUBIN TOTAL: 1.6 mg/dL — AB (ref 0.3–1.2)
BUN: 9 mg/dL (ref 6–20)
CALCIUM: 8.2 mg/dL — AB (ref 8.9–10.3)
CO2: 22 mmol/L (ref 22–32)
Chloride: 104 mmol/L (ref 101–111)
Creatinine, Ser: 0.88 mg/dL (ref 0.61–1.24)
GLUCOSE: 80 mg/dL (ref 65–99)
Potassium: 3.8 mmol/L (ref 3.5–5.1)
Sodium: 133 mmol/L — ABNORMAL LOW (ref 135–145)
TOTAL PROTEIN: 5 g/dL — AB (ref 6.5–8.1)

## 2017-06-06 LAB — CBC
HEMATOCRIT: 28.1 % — AB (ref 39.0–52.0)
Hemoglobin: 9.1 g/dL — ABNORMAL LOW (ref 13.0–17.0)
MCH: 25.2 pg — AB (ref 26.0–34.0)
MCHC: 32.4 g/dL (ref 30.0–36.0)
MCV: 77.8 fL — AB (ref 78.0–100.0)
Platelets: 501 10*3/uL — ABNORMAL HIGH (ref 150–400)
RBC: 3.61 MIL/uL — ABNORMAL LOW (ref 4.22–5.81)
RDW: 21.3 % — AB (ref 11.5–15.5)
WBC: 16.6 10*3/uL — ABNORMAL HIGH (ref 4.0–10.5)

## 2017-06-06 LAB — HIV ANTIBODY (ROUTINE TESTING W REFLEX): HIV Screen 4th Generation wRfx: NONREACTIVE

## 2017-06-06 MED ORDER — PANTOPRAZOLE SODIUM 40 MG PO TBEC
40.0000 mg | DELAYED_RELEASE_TABLET | Freq: Two times a day (BID) | ORAL | Status: DC
  Administered 2017-06-06: 40 mg via ORAL
  Filled 2017-06-06: qty 1

## 2017-06-06 MED ORDER — PANTOPRAZOLE SODIUM 40 MG PO TBEC: 40.0000 mg | DELAYED_RELEASE_TABLET | Freq: Two times a day (BID) | ORAL | 0 refills | Status: DC

## 2017-06-06 MED ORDER — VITAMIN B-1 100 MG PO TABS: 100.0000 mg | ORAL_TABLET | Freq: Every day | ORAL | Status: DC

## 2017-06-06 MED ORDER — THIAMINE HCL 100 MG PO TABS: 100.0000 mg | ORAL_TABLET | Freq: Every day | ORAL | 0 refills | Status: DC

## 2017-06-06 MED ORDER — FOLIC ACID 1 MG PO TABS: 1.0000 mg | ORAL_TABLET | Freq: Every day | ORAL | Status: DC

## 2017-06-06 MED ORDER — HYDROCODONE-ACETAMINOPHEN 7.5-325 MG PO TABS
1.0000 | ORAL_TABLET | Freq: Four times a day (QID) | ORAL | Status: DC | PRN
  Filled 2017-06-06: qty 1

## 2017-06-06 NOTE — Progress Notes (Signed)
Palliative Medicine consult noted. Due to high referral volume, there may be a delay seeing this patient. Please call the Palliative Medicine Team office at [phone] if recommendations are needed in the interim.  Thank you for inviting [place] to see this patient.  Marjie Skiff Taleia Sadowski, RN, BSN, [facility] Palliative Medicine Team 06/06/2017 1:30 PM Office [phone]

## 2017-06-06 NOTE — Care Management Note (Signed)
Case Management Note  Patient Details  Name: Hamza Empson MRN: [id number] Date of Birth: [birth year]  Subjective/Objective:      Admitted with GI Bleed.                      Newly diagnosed malignant gastric tumor/Gastric adenocarcinoma    Action/Plan: Transition to home today. Post hospital f/u scheduled for 06/12/2017 @ 8am with Cammie Sickle NP. Referral for outpatient palliative follow up (for symptoms management) should be made @ appointment time. Home health services (RN,SW) to follow once hospital f/u appointment completed.  Father to provide transportation to home.   Expected Discharge Date:  06/06/17               Expected Discharge Plan:  [place]  In-House Referral:     Discharge planning Services  CM Consult, Follow-up appt scheduled  Post Acute Care Choice:  Home Health Choice offered to:  Patient  DME Arranged:    DME Agency:     HH Arranged:  Social Work, Engineer, materials Agency:  [place]  Status of Service:  Completed, signed off  If discussed at [employer], dates discussed:    Additional Comments:  Sharin Mons, RN 06/06/2017, 4:44 PM

## 2017-06-06 NOTE — Discharge Summary (Signed)
Discharge Summary  Ryan Collins LHT:[id number] DOB: 10-Dec-1960  PCP: Patient, No Pcp Per  Admit date: 06/03/2017 Discharge date: 06/06/2017  Time spent: 25 minutes  Recommendations for Outpatient Follow-up:  1. Follow up with your PCP post hospitalization 2. Follow up with oncology post hospitalization Dr. Burr Medico on 07/09/17 3. Take your medications as prescribed 4. Please avoid NSAIDS to prevent acute GI bleed 5. Use tylenol as needed for pain less than 2 grams per day. 6. Follow up with outpatient palliative care team for symptoms management  Discharge Diagnoses:  Active Hospital Problems   Diagnosis Date Noted  . Melena 06/03/2017  . Upper GI bleed 06/03/2017  . Symptomatic anemia 06/03/2017  . Iron deficiency anemia 06/03/2017  . History of DVT (deep vein thrombosis) 06/03/2017  . Vitamin B12 deficiency 06/03/2017  . Hypokalemia 06/03/2017  . Alcohol abuse 06/03/2017    Resolved Hospital Problems  No resolved problems to display.    Discharge Condition: stable   Diet recommendation: resume previous diet   Vitals:   06/06/17 0600 06/06/17 1300  BP: (!) 147/79 (!) 153/80  Pulse: (!) 58 (!) 56  Resp: 18 18  Temp: 98.3 F (36.8 C) 98.2 F (36.8 C)  SpO2: 100% 100%    History of present illness:  Ryan Collins a 57 y.o.malewith medical history significant ofprevious DVT about 8 years ago and no longer on anticoagulation whopresented to the emergency department due to fatigue, epigastric pain, and dark stools for the past 4 months. He also admits to regular alcohol use 2-3 40ounces of beer daily but cut back significantly in the past 4 months due to his epigastric abdominal pain. Reports 20 lbs unintentional weight loss in the last 4 months. On presentation Hgb 5 and positive FOBT. GI consulted EGD done on 06/04/17 revealed malignant gastric tumor in the cardia and in the gastric fundus which were biopsied.  06/05/17: Patient seen and examined with no family  member at his bedside. Reports epigastric abd pain which he reports is positional. Improved when he lays on his sides. Denies nausea or vomiting.   06/05/17- Spoke with Dr. Julien Nordmann, medical oncologist, on call. His office will arrange for follow up in the outpatient setting and will inform the patient of appointment. Biopsies positive for adenocarcinoma 06/05/17. CT abd/pelvis w contrast 06/05/17 please refer to radiology report. Appointment made with Dr Burr Medico on 07/09/17.  06/06/17: Palliative care team consulted due to suspected poor prognosis in the setting of newly diagnosed gastric adenocarcinoma for symptoms management.  On the day of discharge the patient was hemodynamically stable. PT evaluated with no immediate needs for assistive device or home PT. Patient will need to keep his appointment with oncology. Will need to follow up with outpatient palliative care team/PCP for symptoms management.   Hospital Course:  Principal Problem:   Melena Active Problems:   Upper GI bleed   Symptomatic anemia   Iron deficiency anemia   History of DVT (deep vein thrombosis)   Vitamin B12 deficiency   Hypokalemia   Alcohol abuse  Melena/epigastric pain with symptomatic anemia, iron deficiency anemia in the setting of newly diagnosed malignant gastric tumor -FOBT+ -Transfused3units pRBC 1/21  -Transfused 2 units pRBC 06/05/17 -GI followed -Oncology consulted Dr. Burr Medico will see on 07/09/17 -EGD done on 06/04/17 revealed malignant gastric tumor in the cardia and in the gastric fundus which were biopsied. Positive for adenocarcinoma 06/05/17. -continue po PPI BID -Fera heme given 1/21  -hg 9.1 from 7.5 post 2U prbc (06/05/17) from 8.0 -  persistent melena; reported 2 dark stools 06/05/17 am  Newly diagnosed malignant gastric tumor/Gastric adenocarcinoma -EGD done on 06/04/17 revealed malignant gastric tumor in the cardia and in the gastric fundus which were biopsied (5 cm). -pathology positive for  adenocarcinoma 06/05/17 -CT abd pelvis w contrast 06/05/17: see radiology report -Oncology contacted Dr. Julien Nordmann aware. He will schedule appointment for outpatient follow up.\ -Appointment with Dr Burr Medico 07/09/17  Hx DVT -Vascular USshows chronic DVT on right popliteal vein, negative for acute DVT -hold oral anticoagulation due to suspected active GI bleed -high risk for DVT due to malignancy -started chemical DVT ppx sq lovenox 40 mg daily -Follow up with PCP  Vit B12 deficiency -Replace as indicated  Alcohol abuse -Admits to drinking 2-3 of 40oz beer daily until 4 months ago. Has cut back significantly since starting abdominal pain. Monitor for withdrawal.  -continue Folate, B1  Leukocytosis, improving -suspect Reactive from malignancy -No fevers or other signs/complaints for infectious etiology -follow up with PCP/outpatient palliative care team within a week   Procedures:  EGD 06/04/17 with biopsies  Consultations:  GI  Medical oncology  Discharge Exam: BP (!) 153/80 (BP Location: Right Arm)   Pulse (!) 56   Temp 98.2 F (36.8 C) (Oral)   Resp 18   Ht 5' 11.25" (1.81 m)   Wt 77.1 kg (170 lb)   SpO2 100%   BMI 23.54 kg/m   General: 57 yo AAM WD WN NAD A&O x3  Cardiovascular: RRR no rubs or gallops  Respiratory: CTA no rales or wheezes Abdomen: Mild epigastric tenderness on palpation  Discharge Instructions You were cared for by a hospitalist during your hospital stay. If you have any questions about your discharge medications or the care you received while you were in the hospital after you are discharged, you can call the unit and asked to speak with the hospitalist on call if the hospitalist that took care of you is not available. Once you are discharged, your primary care physician will handle any further medical issues. Please note that NO REFILLS for any discharge medications will be authorized once you are discharged, as it is imperative that you return  to your primary care physician (or establish a relationship with a primary care physician if you do not have one) for your aftercare needs so that they can reassess your need for medications and monitor your lab values.   Allergies as of 06/06/2017   No Known Allergies     Medication List    STOP taking these medications   OVER THE COUNTER MEDICATION   ranitidine 150 MG tablet Commonly known as:  ZANTAC     TAKE these medications   pantoprazole 40 MG tablet Commonly known as:  PROTONIX Take 1 tablet (40 mg total) by mouth 2 (two) times daily.   thiamine 100 MG tablet Take 1 tablet (100 mg total) by mouth daily. Start taking on:  06/07/2017      No Known Allergies Follow-up Information    [facility] Follow up.   Specialty:  PALLIATIVE CARE Contact information: [address] [id number] mc [place]       [name], MD Follow up.   Specialties:  Hematology, Oncology Contact information: [place] [address] [phone]            The results of significant diagnostics from this hospitalization (including imaging, microbiology, ancillary and laboratory) are listed below for reference.    Significant Diagnostic  Studies: Ct Chest W Contrast  Result Date: 06/05/2017 CLINICAL DATA:  Cough, mid abdominal pain, progressive fatigue, melena, weight loss. Gastric cancer, initial workup. EXAM: CT CHEST, ABDOMEN, AND PELVIS WITH CONTRAST TECHNIQUE: Multidetector CT imaging of the chest, abdomen and pelvis was performed following the standard protocol during bolus administration of intravenous contrast. CONTRAST:  190mL ISOVUE-300 IOPAMIDOL (ISOVUE-300) INJECTION 61% COMPARISON:  None. FINDINGS: CT CHEST FINDINGS Cardiovascular: The heart is normal in size. No pericardial effusion. No evidence of thoracic aortic aneurysm. Mediastinum/Nodes: No suspicious mediastinal lymphadenopathy.  Visualized thyroid is unremarkable. Lungs/Pleura: Mild centrilobular emphysematous changes, upper lobe predominant. No suspicious pulmonary nodules. Small bilateral pleural effusions, left greater than right. Mild dependent atelectasis in the bilateral lower lobes. No pneumothorax. Musculoskeletal: Degenerative changes of the lower thoracic spine. CT ABDOMEN PELVIS FINDINGS Hepatobiliary: Mildly heterogeneous perfusion of the liver. Gallbladder is unremarkable. No intrahepatic or extrahepatic ductal dilatation. Pancreas: Within normal limits. Spleen: 1.8 cm hypoenhancing lesion along the inferior spleen (series 3/image 69), indeterminate. Adrenals/Urinary Tract: Adrenal glands are within normal limits. 2.2 cm mildly complex right lower pole renal cyst with a rim calcification and possible thin septation, measuring just higher than simple fluid density, but without convincing enhancement (series 3/image 93), benign (Bosniak II). Left kidney is within normal limits. No hydronephrosis. Bladder is underdistended but unremarkable. Stomach/Bowel: Large fungating mass in the gastric cardia/posterior gastric fundus, measuring approximately 5.3 x 9.1 x 5.5 cm (series 3/image 60), likely corresponding to the patient's newly diagnosed gastric cancer. No evidence of bowel obstruction. Jejunojejunal intussusception in the right mid abdomen (series 3/image 103), likely transient/inconsequential. Appendix is not discretely visualized. Vascular/Lymphatic: No evidence of abdominal aortic aneurysm. No suspicious abdominopelvic lymphadenopathy. Reproductive: Prostate is grossly unremarkable. Other: Small volume pelvic ascites. Abnormal soft tissue in the left upper abdomen (series 3/image 66), reflecting extra gastric extension of tumor, including a dominant 3.5 x 4.2 cm peritoneal implant. Musculoskeletal: Mild degenerative changes of the lower lumbar spine. IMPRESSION: 9.1 cm fungating mass in the gastric cardia/posterior gastric  fundus, corresponding to the patient's newly diagnosed gastric cancer. Associated extra gastric extension with peritoneal disease in the left upper abdomen, including a dominant 4.2 cm peritoneal implant. Small volume pelvic ascites. 1.8 cm hypoenhancing lesion along the inferior spleen, indeterminate. Electronically Signed   By: Julian Hy M.D.   On: 06/05/2017 09:02   Ct Abdomen Pelvis W Contrast  Result Date: 06/05/2017 CLINICAL DATA:  Cough, mid abdominal pain, progressive fatigue, melena, weight loss. Gastric cancer, initial workup. EXAM: CT CHEST, ABDOMEN, AND PELVIS WITH CONTRAST TECHNIQUE: Multidetector CT imaging of the chest, abdomen and pelvis was performed following the standard protocol during bolus administration of intravenous contrast. CONTRAST:  133mL ISOVUE-300 IOPAMIDOL (ISOVUE-300) INJECTION 61% COMPARISON:  None. FINDINGS: CT CHEST FINDINGS Cardiovascular: The heart is normal in size. No pericardial effusion. No evidence of thoracic aortic aneurysm. Mediastinum/Nodes: No suspicious mediastinal lymphadenopathy. Visualized thyroid is unremarkable. Lungs/Pleura: Mild centrilobular emphysematous changes, upper lobe predominant. No suspicious pulmonary nodules. Small bilateral pleural effusions, left greater than right. Mild dependent atelectasis in the bilateral lower lobes. No pneumothorax. Musculoskeletal: Degenerative changes of the lower thoracic spine. CT ABDOMEN PELVIS FINDINGS Hepatobiliary: Mildly heterogeneous perfusion of the liver. Gallbladder is unremarkable. No intrahepatic or extrahepatic ductal dilatation. Pancreas: Within normal limits. Spleen: 1.8 cm hypoenhancing lesion along the inferior spleen (series 3/image 69), indeterminate. Adrenals/Urinary Tract: Adrenal glands are within normal limits. 2.2 cm mildly complex right lower pole renal cyst with a rim calcification and possible thin septation, measuring just higher  than simple fluid density, but without convincing  enhancement (series 3/image 93), benign (Bosniak II). Left kidney is within normal limits. No hydronephrosis. Bladder is underdistended but unremarkable. Stomach/Bowel: Large fungating mass in the gastric cardia/posterior gastric fundus, measuring approximately 5.3 x 9.1 x 5.5 cm (series 3/image 60), likely corresponding to the patient's newly diagnosed gastric cancer. No evidence of bowel obstruction. Jejunojejunal intussusception in the right mid abdomen (series 3/image 103), likely transient/inconsequential. Appendix is not discretely visualized. Vascular/Lymphatic: No evidence of abdominal aortic aneurysm. No suspicious abdominopelvic lymphadenopathy. Reproductive: Prostate is grossly unremarkable. Other: Small volume pelvic ascites. Abnormal soft tissue in the left upper abdomen (series 3/image 66), reflecting extra gastric extension of tumor, including a dominant 3.5 x 4.2 cm peritoneal implant. Musculoskeletal: Mild degenerative changes of the lower lumbar spine. IMPRESSION: 9.1 cm fungating mass in the gastric cardia/posterior gastric fundus, corresponding to the patient's newly diagnosed gastric cancer. Associated extra gastric extension with peritoneal disease in the left upper abdomen, including a dominant 4.2 cm peritoneal implant. Small volume pelvic ascites. 1.8 cm hypoenhancing lesion along the inferior spleen, indeterminate. Electronically Signed   By: Julian Hy M.D.   On: 06/05/2017 09:02    Microbiology: No results found for this or any previous visit (from the past 240 hour(s)).   Labs: Basic Metabolic Panel: Recent Labs  Lab 06/03/17 1357 06/04/17 0415 06/05/17 0303 06/06/17 0520  NA 137 136 133* 133*  K 3.4* 4.1 3.8 3.8  CL 102 106 104 104  CO2 25 21* 21* 22  GLUCOSE 103* 92 92 80  BUN 9 9 11 9   CREATININE 0.87 0.86 0.97 0.88  CALCIUM 8.1* 8.2* 8.2* 8.2*  MG  --  2.2  --   --    Liver Function Tests: Recent Labs  Lab 06/03/17 1524 06/06/17 0520  AST 15 14*    ALT 8* 9*  ALKPHOS 49 49  BILITOT 0.3 1.6*  PROT 5.8* 5.0*  ALBUMIN 2.9* 2.4*   Recent Labs  Lab 06/03/17 1524  LIPASE 22   No results for input(s): AMMONIA in the last 168 hours. CBC: Recent Labs  Lab 06/03/17 1357 06/04/17 0415 06/05/17 0303 06/06/17 0520  WBC 14.7* 19.8* 20.0* 16.6*  HGB 5.0* 8.0* 7.5* 9.1*  HCT 15.9* 24.0* 23.3* 28.1*  MCV 69.4* 75.0* 74.9* 77.8*  PLT 662* 589* 570* 501*   Cardiac Enzymes: No results for input(s): CKTOTAL, CKMB, CKMBINDEX, TROPONINI in the last 168 hours. BNP: BNP (last 3 results) Recent Labs    06/03/17 1358  BNP 171.5*    ProBNP (last 3 results) No results for input(s): PROBNP in the last 8760 hours.  CBG: Recent Labs  Lab 06/04/17 2128  GLUCAP 89       Signed:  Kayleen Memos, MD [facility] 06/06/2017, 1:57 PM

## 2017-06-06 NOTE — Progress Notes (Signed)
Responded to [facility] to support patient. Patient shared his story and is currently coping well.  Prayed with patient per his request.  Provided emotional and spiritual support, empathetic listening and presence. Will follow as needed.  Jaclynn Major, [place], [facility] [address], Pager [phone]

## 2017-06-06 NOTE — Telephone Encounter (Signed)
Appt has been scheduled for the pt to see Dr. Burr Medico on 2/26 per staff message. Letter mailed to the pt since he's currently in the hospital.

## 2017-06-06 NOTE — Progress Notes (Signed)
[name] to be D/C'd Home per MD order.  Discussed with the patient and all questions fully answered.  VSS, Skin clean, dry and intact without evidence of skin break down, no evidence of skin tears noted. IV catheter discontinued intact. Site without signs and symptoms of complications. Dressing and pressure applied.  An After Visit Summary was printed and given to the patient. Patient received prescription.  D/c education completed with patient/family including follow up instructions, medication list, d/c activities limitations if indicated, with other d/c instructions as indicated by MD - patient able to verbalize understanding, all questions fully answered.   Patient instructed to return to ED, call 911, or call MD for any changes in condition.   Patient waiting on brother to arrive to take him home. Will continue to assess.  Ryan Collins  06/06/2017 6:00 PM

## 2017-06-06 NOTE — Discharge Instructions (Signed)
Gastrointestinal Bleeding °Gastrointestinal bleeding is bleeding somewhere along the path food travels through the body (digestive tract). This path is anywhere between the mouth and the opening of the butt (anus). You may have blood in your poop (stools) or have black poop. If you throw up (vomit), there may be blood in it. °This condition can be mild, serious, or even life-threatening. If you have a lot of bleeding, you may need to stay in the hospital. °Follow these instructions at home: °· Take over-the-counter and prescription medicines only as told by your doctor. °· Eat foods that have a lot of fiber in them. These foods include whole grains, fruits, and vegetables. You can also try eating 1-3 prunes each day. °· Drink enough fluid to keep your pee (urine) clear or pale yellow. °· Keep all follow-up visits as told by your doctor. This is important. °Contact a doctor if: °· Your symptoms do not get better. °Get help right away if: °· Your bleeding gets worse. °· You feel dizzy or you pass out (faint). °· You feel weak. °· You have very bad cramps in your back or belly (abdomen). °· You pass large clumps of blood (clots) in your poop. °· Your symptoms are getting worse. °This information is not intended to replace advice given to you by your health care provider. Make sure you discuss any questions you have with your health care provider. °Document Released: 02/07/2008 Document Revised: 10/06/2015 Document Reviewed: 10/18/2014 °Elsevier Interactive Patient Education © 2018 [employer] ° °

## 2017-06-06 NOTE — Evaluation (Signed)
Physical Therapy Evaluation Patient Details Name: Ryan Collins Scholz MRN: [id number] DOB: 05/09/1961 Today's Date: 06/06/2017   History of Present Illness   Nathan Moctezuma is a 57 y.o. male with medical history significant of previous DVT about 8 years ago and no longer on anticoagulation who presents to the emergency department due to fatigue and dark stools.   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  Clinical Impression  Pt is at or close to baseline functioning and should be safe at home with brother as back up assist, though pt is Independent. There are no further acute PT needs.  Will sign off at this time.     Follow Up Recommendations No PT follow up    Equipment Recommendations  None recommended by PT    Recommendations for Other Services       Precautions / Restrictions Precautions Precautions: None      Mobility  Bed Mobility Overal bed mobility: Independent                Transfers Overall transfer level: Independent                  Ambulation/Gait Ambulation/Gait assistance: Independent Ambulation Distance (Feet): 250 Feet Assistive device: None Gait Pattern/deviations: Step-through pattern     General Gait Details: generally steady with small deviations due to hiccups causing abdominal pain while ambulation.  Stairs Stairs: Yes Stairs assistance: Modified independent (Device/Increase time) Stair Management: One rail Right;Alternating pattern;Forwards Number of Stairs: 5 General stair comments: safe with rail  Wheelchair Mobility    Modified Rankin (Stroke Patients Only)       Balance Overall balance assessment: Independent                                           Pertinent Vitals/Pain Pain Assessment: Faces Pain Score: 4  Faces Pain Scale: Hurts little more Pain Location: stomach Pain Descriptors / Indicators: Discomfort;Grimacing Pain Intervention(s): Monitored during session     Home Living Family/patient expects to be discharged to:: Private residence Living Arrangements: Other relatives;Other (Comment)(brother) Available Help at Discharge: Family;Available PRN/intermittently Type of Home: House Home Access: Stairs to enter Entrance Stairs-Rails: Psychiatric nurse of Steps: several Home Layout: One level Home Equipment: None      Prior Function Level of Independence: Independent               Hand Dominance        Extremity/Trunk Assessment   Upper Extremity Assessment Upper Extremity Assessment: Overall WFL for tasks assessed    Lower Extremity Assessment Lower Extremity Assessment: Overall WFL for tasks assessed       Communication   Communication: No difficulties  Cognition Arousal/Alertness: Awake/alert Behavior During Therapy: WFL for tasks assessed/performed Overall Cognitive Status: Within Functional Limits for tasks assessed                                        General Comments      Exercises     Assessment/Plan    PT Assessment Patent does not need any further PT services  PT Problem List         PT Treatment Interventions      PT Goals (Current goals can be found in the Care Plan section)  Acute Rehab PT Goals PT Goal Formulation: All assessment and education complete, DC therapy    Frequency     Barriers to discharge        Co-evaluation               AM-PAC PT "6 Clicks" Daily Activity  Outcome Measure Difficulty turning over in bed (including adjusting bedclothes, sheets and blankets)?: None Difficulty moving from lying on back to sitting on the side of the bed? : None Difficulty sitting down on and standing up from a chair with arms (e.g., wheelchair, bedside commode, etc,.)?: None Help needed moving to and from a bed to chair (including a wheelchair)?: None Help needed walking in hospital room?: None Help needed climbing 3-5 steps with a railing? :  None 6 Click Score: 24    End of Session   Activity Tolerance: Patient tolerated treatment well Patient left: in bed;with call bell/phone within reach Nurse Communication: Mobility status      Time: 7505-1833 PT Time Calculation (min) (ACUTE ONLY): 13 min   Charges:   PT Evaluation $PT Eval Low Complexity: 1 Low     PT G Codes:        07-05-17  Donnella Sham, PT [phone]  (pager)  Tessie Fass Deshanda Molitor 07-05-2017, 11:56 AM

## 2017-06-07 ENCOUNTER — Telehealth: Payer: Self-pay | Admitting: Hematology

## 2017-06-07 DIAGNOSIS — C169 Malignant neoplasm of stomach, unspecified: Secondary | ICD-10-CM

## 2017-06-07 LAB — BPAM RBC
BLOOD PRODUCT EXPIRATION DATE: 201902072359
BLOOD PRODUCT EXPIRATION DATE: 201902072359
Blood Product Expiration Date: 201902072359
Blood Product Expiration Date: 201902072359
Blood Product Expiration Date: 201902072359
ISSUE DATE / TIME: 201901232134
ISSUE DATE / TIME: 201901211819
ISSUE DATE / TIME: 201901212115
ISSUE DATE / TIME: 201901220016
ISSUE DATE / TIME: 201901240045
UNIT TYPE AND RH: 6200
UNIT TYPE AND RH: 6200
Unit Type and Rh: 6200
Unit Type and Rh: 6200
Unit Type and Rh: 6200

## 2017-06-07 LAB — TYPE AND SCREEN
ABO/RH(D): A POS
ANTIBODY SCREEN: NEGATIVE
UNIT DIVISION: 0
UNIT DIVISION: 0
Unit division: 0
Unit division: 0
Unit division: 0

## 2017-06-07 NOTE — Telephone Encounter (Signed)
Pt's appt has been moved from 2/26 to 1/29 at 11am with Dr. Burr Medico. Pt agreed to the appt date and time. Was told to disregard the letter that was mailed for appt date of 2/26.

## 2017-06-07 NOTE — Progress Notes (Signed)
  Oncology Nurse Navigator Documentation  Navigator Location: [facility] (06/07/17 1282) Referral date to RadOnc/MedOnc: 06/06/17 (06/07/17 0935) )Navigator Encounter Type: Introductory phone call (06/07/17 0935)                             Interventions: Referrals;Coordination of Care (06/07/17 0935) Referrals: Nutrition/dietician (06/07/17 0935) Coordination of Care: Appts (06/07/17 0935)   called patient to confirm appointment for the 06/11/17. Patient aware of time , date and location. Referral for nutrition placed.     Acuity: Level 2 (06/07/17 0935)   Acuity Level 2: Initial guidance, education and coordination as needed;Assistance expediting appointments (06/07/17 0935)     Time Spent with Patient: 15 (06/07/17 0935)

## 2017-06-10 DIAGNOSIS — C169 Malignant neoplasm of stomach, unspecified: Secondary | ICD-10-CM | POA: Insufficient documentation

## 2017-06-10 NOTE — Progress Notes (Signed)
[place]  [phone]) [phone] Fax:([phone]) [place] Note   Patient Care Team: Patient, No Pcp Per as PCP - General (General Practice) 06/11/2017  Referral physician: Hospital discharge   CHIEF COMPLAINTS/PURPOSE OF CONSULTATION:  Gastric Cancer  Oncology History   Cancer Staging Gastric cancer [facility]) Staging form: Stomach, AJCC 8th Edition - Clinical stage from 06/04/2017: Stage IVB (cT4, cN0, cM1) - Signed by [name], MD on 06/10/2017       Gastric cancer ([place])   06/03/2017 - 06/06/2017 Hospital Admission    Admitted to the hospital on 06/03/17 with complaints of fatigue and melena. During his stay he underwent an endoscopy that revealed a malignant gastric tumor in the cardia and in the gastric fundus. Biopsy results revealed adenocarcinoma. Pt was discharged on 06/06/17.      06/04/2017 Initial Biopsy    Diagnosis 06/04/17 Stomach, biopsy, Fundus - ADENOCARCINOMA - SEE COMMENT Microscopic Comment The stomach biopsy is of an adenocarcinoma arising in a background of intestinal metaplasia. A Warthin-Starry stain is performed to determine the possibility of the presence of Helicobacter pylori. The Warthin-Starry stain is negative for organisms morphologically consistent with Helicobacter pylori.      06/04/2017 Procedure    Esophagogastroduodenoscopy 06/04/17 by Dr. Benson Norway Impression: Normal esophagus. Malignant gastric tumor in the cardia and in the  gastric fundus. Biopsied. Normal examined duodenum.      06/05/2017 Imaging    CT CAP W Contrast 06/05/17 IMPRESSION: 9.1 cm fungating mass in the gastric cardia/posterior gastric fundus, corresponding to the patient's newly diagnosed gastric Cancer. Associated extra gastric extension with peritoneal disease in the left upper abdomen, including a dominant 4.2 cm peritoneal implant. Small volume pelvic ascites. 1.8 cm hypoenhancing lesion along the inferior spleen, indeterminate.      06/10/2017 Initial Diagnosis    Gastric cancer (HCC)        HISTORY OF PRESENTING ILLNESS:  Ryan Collins 57 y.o. male is a here because of newly diagnosed gastric cancer. The patient was referred by hospitalist after her recent hospital discharge.  The patient presents to the clinic today by himself.   Pt went to urgent care on 05/15/17 with complaints of worsening fatigue, central abdominal pain, black stool for 4 months, and 50 lbs weight loss in the past 6 months. discomfort over the past few months. He described the pain as sharp and dull that is worse after he eats. The pain would last 30-40 minutes after eating. Pt was then seen in the ED and admitted to the hospital on 06/03/17 with complaints of fatigue and melena. During his stay he underwent an endoscopy that revealed a malignant gastric tumor in the cardia and in the gastric fundus. Biopsy of the mass revealed adenocarcinoma.  Patient received blood transfusion and 1 dose of IV Feraheme, tolerated well.  His fatigue has much improved.  Pt was dischared on 06/06/17.    Patient is single, no children, lives with his brother in a trailer, he has multiple brothers and sisters and some of them live in the town.  Her mother also lives in [place].  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  He is concerned about his finances if he is not able to continue working.     MEDICAL HISTORY:  Past Medical History:  Diagnosis Date  . DVT (deep venous thrombosis) ([place]) 2011  . GI bleeding 2001  . History of DVT of lower extremity   . Melena 05/2017  . Stomach  ulcer    Clinically suspected; No EGD confirmation as of 06/03/17    SURGICAL HISTORY: Past Surgical History:  Procedure Laterality Date  . ESOPHAGOGASTRODUODENOSCOPY  06/04/2017  . ESOPHAGOGASTRODUODENOSCOPY N/A 06/04/2017   Procedure: ESOPHAGOGASTRODUODENOSCOPY (EGD);  Surgeon: Carol Ada, MD;  Location: [facility];  Service: Endoscopy;  Laterality: N/A;     SOCIAL HISTORY: Social History   Socioeconomic History  . Marital status: Legally Separated    Spouse name: Not on file  . Number of children: Not on file  . Years of education: Not on file  . Highest education level: Not on file  Social Needs  . Financial resource strain: Not on file  . Food insecurity - worry: Not on file  . Food insecurity - inability: Not on file  . Transportation needs - medical: Not on file  . Transportation needs - non-medical: Not on file  Occupational History  . Not on file  Tobacco Use  . Smoking status: Never Smoker  . Smokeless tobacco: Never Used  Substance and Sexual Activity  . Alcohol use: Yes    Alcohol/week: 3.0 oz    Types: 5 Cans of beer per week    Comment: 05/2017 i QUIT DRINKING 4 MONTHS AGO "  . Drug use: Yes    Types: Marijuana  . Sexual activity: Not on file  Other Topics Concern  . Not on file  Social History Narrative  . Not on file    FAMILY HISTORY: Family History  Problem Relation Age of Onset  . Hypertension Mother   . Cancer Sister        unknown type     ALLERGIES:  has No Known Allergies.  MEDICATIONS:  Current Outpatient Medications  Medication Sig Dispense Refill  . pantoprazole (PROTONIX) 40 MG tablet Take 1 tablet (40 mg total) by mouth 2 (two) times daily. 60 tablet 0  . thiamine 100 MG tablet Take 1 tablet (100 mg total) by mouth daily. 30 tablet 0   No current facility-administered medications for this visit.     REVIEW OF SYSTEMS:   Constitutional: Denies fevers, chills or abnormal night sweats, (+) over 50 pound weight loss in the past 6 months, (+) fatigue has improved after recent blood transfusion and IV iron Eyes: Denies blurriness of vision, double vision or watery eyes Ears, nose, mouth, throat, and face: Denies mucositis or sore throat Respiratory: Denies cough, dyspnea or wheezes Cardiovascular: Denies palpitation, chest discomfort or lower extremity swelling Gastrointestinal:  Denies  nausea, heartburN, (+) intermittent epigastric pain, and melena Skin: Denies abnormal skin rashes Lymphatics: Denies new lymphadenopathy or easy bruising Neurological:Denies numbness, tingling or new weaknesses Behavioral/Psych: Mood is stable, no new changes  All other systems were reviewed with the patient and are negative.  PHYSICAL EXAMINATION: ECOG PERFORMANCE STATUS: 1 - Symptomatic but completely ambulatory  Vitals:   06/11/17 1106  BP: 138/74  Pulse: 68  Resp: 18  Temp: 98.5 F (36.9 C)  SpO2: 100%   Filed Weights   06/11/17 1106  Weight: 167 lb 4.8 oz (75.9 kg)    GENERAL:alert, no distress and comfortable SKIN: skin color, texture, turgor are normal, no rashes or significant lesions EYES: normal, conjunctiva are pink and non-injected, sclera clear OROPHARYNX:no exudate, no erythema and lips, buccal mucosa, and tongue normal  NECK: supple, thyroid normal size, non-tender, without nodularity LYMPH:  no palpable lymphadenopathy in the cervical, axillary or inguinal LUNGS: clear to auscultation and percussion with normal breathing effort HEART: regular rate & rhythm and  no murmurs and no lower extremity edema ABDOMEN:abdomen soft, non-tender and normal bowel sounds Musculoskeletal:no cyanosis of digits and no clubbing  PSYCH: alert & oriented x 3 with fluent speech NEURO: no focal motor/sensory deficits  LABORATORY DATA:  I have reviewed the data as listed CBC Latest Ref Rng & Units 06/06/2017 06/05/2017 06/04/2017  WBC 4.0 - 10.5 K/uL 16.6(H) 20.0(H) 19.8(H)  Hemoglobin 13.0 - 17.0 g/dL 9.1(L) 7.5(L) 8.0(L)  Hematocrit 39.0 - 52.0 % 28.1(L) 23.3(L) 24.0(L)  Platelets 150 - 400 K/uL 501(H) 570(H) 589(H)    CMP Latest Ref Rng & Units 06/06/2017 06/05/2017 06/04/2017  Glucose 65 - 99 mg/dL 80 92 92  BUN 6 - 20 mg/dL 9 11 9   Creatinine 0.61 - 1.24 mg/dL 0.88 0.97 0.86  Sodium 135 - 145 mmol/L 133(L) 133(L) 136  Potassium 3.5 - 5.1 mmol/L 3.8 3.8 4.1  Chloride 101 -  111 mmol/L 104 104 106  CO2 22 - 32 mmol/L 22 21(L) 21(L)  Calcium 8.9 - 10.3 mg/dL 8.2(L) 8.2(L) 8.2(L)  Total Protein 6.5 - 8.1 g/dL 5.0(L) - -  Total Bilirubin 0.3 - 1.2 mg/dL 1.6(H) - -  Alkaline Phos 38 - 126 U/L 49 - -  AST 15 - 41 U/L 14(L) - -  ALT 17 - 63 U/L 9(L) - -   PATHOLOGY  Diagnosis 06/04/17 Stomach, biopsy, Fundus - ADENOCARCINOMA - SEE COMMENT Microscopic Comment The stomach biopsy is of an adenocarcinoma arising in a background of intestinal metaplasia. A Warthin-Starry stain is performed to determine the possibility of the presence of Helicobacter pylori. The Warthin-Starry stain is negative for organisms morphologically consistent with Helicobacter pylori.  RADIOGRAPHIC STUDIES: I have personally reviewed the radiological images as listed and agreed with the findings in the report. Ct Chest W Contrast  Result Date: 06/05/2017 CLINICAL DATA:  Cough, mid abdominal pain, progressive fatigue, melena, weight loss. Gastric cancer, initial workup. EXAM: CT CHEST, ABDOMEN, AND PELVIS WITH CONTRAST TECHNIQUE: Multidetector CT imaging of the chest, abdomen and pelvis was performed following the standard protocol during bolus administration of intravenous contrast. CONTRAST:  155m ISOVUE-300 IOPAMIDOL (ISOVUE-300) INJECTION 61% COMPARISON:  None. FINDINGS: CT CHEST FINDINGS Cardiovascular: The heart is normal in size. No pericardial effusion. No evidence of thoracic aortic aneurysm. Mediastinum/Nodes: No suspicious mediastinal lymphadenopathy. Visualized thyroid is unremarkable. Lungs/Pleura: Mild centrilobular emphysematous changes, upper lobe predominant. No suspicious pulmonary nodules. Small bilateral pleural effusions, left greater than right. Mild dependent atelectasis in the bilateral lower lobes. No pneumothorax. Musculoskeletal: Degenerative changes of the lower thoracic spine. CT ABDOMEN PELVIS FINDINGS Hepatobiliary: Mildly heterogeneous perfusion of the liver.  Gallbladder is unremarkable. No intrahepatic or extrahepatic ductal dilatation. Pancreas: Within normal limits. Spleen: 1.8 cm hypoenhancing lesion along the inferior spleen (series 3/image 69), indeterminate. Adrenals/Urinary Tract: Adrenal glands are within normal limits. 2.2 cm mildly complex right lower pole renal cyst with a rim calcification and possible thin septation, measuring just higher than simple fluid density, but without convincing enhancement (series 3/image 93), benign (Bosniak II). Left kidney is within normal limits. No hydronephrosis. Bladder is underdistended but unremarkable. Stomach/Bowel: Large fungating mass in the gastric cardia/posterior gastric fundus, measuring approximately 5.3 x 9.1 x 5.5 cm (series 3/image 60), likely corresponding to the patient's newly diagnosed gastric cancer. No evidence of bowel obstruction. Jejunojejunal intussusception in the right mid abdomen (series 3/image 103), likely transient/inconsequential. Appendix is not discretely visualized. Vascular/Lymphatic: No evidence of abdominal aortic aneurysm. No suspicious abdominopelvic lymphadenopathy. Reproductive: Prostate is grossly unremarkable. Other: Small volume pelvic ascites.  Abnormal soft tissue in the left upper abdomen (series 3/image 66), reflecting extra gastric extension of tumor, including a dominant 3.5 x 4.2 cm peritoneal implant. Musculoskeletal: Mild degenerative changes of the lower lumbar spine. IMPRESSION: 9.1 cm fungating mass in the gastric cardia/posterior gastric fundus, corresponding to the patient's newly diagnosed gastric cancer. Associated extra gastric extension with peritoneal disease in the left upper abdomen, including a dominant 4.2 cm peritoneal implant. Small volume pelvic ascites. 1.8 cm hypoenhancing lesion along the inferior spleen, indeterminate. Electronically Signed   By: Julian Hy M.D.   On: 06/05/2017 09:02   Ct Abdomen Pelvis W Contrast  Result Date:  06/05/2017 CLINICAL DATA:  Cough, mid abdominal pain, progressive fatigue, melena, weight loss. Gastric cancer, initial workup. EXAM: CT CHEST, ABDOMEN, AND PELVIS WITH CONTRAST TECHNIQUE: Multidetector CT imaging of the chest, abdomen and pelvis was performed following the standard protocol during bolus administration of intravenous contrast. CONTRAST:  165m ISOVUE-300 IOPAMIDOL (ISOVUE-300) INJECTION 61% COMPARISON:  None. FINDINGS: CT CHEST FINDINGS Cardiovascular: The heart is normal in size. No pericardial effusion. No evidence of thoracic aortic aneurysm. Mediastinum/Nodes: No suspicious mediastinal lymphadenopathy. Visualized thyroid is unremarkable. Lungs/Pleura: Mild centrilobular emphysematous changes, upper lobe predominant. No suspicious pulmonary nodules. Small bilateral pleural effusions, left greater than right. Mild dependent atelectasis in the bilateral lower lobes. No pneumothorax. Musculoskeletal: Degenerative changes of the lower thoracic spine. CT ABDOMEN PELVIS FINDINGS Hepatobiliary: Mildly heterogeneous perfusion of the liver. Gallbladder is unremarkable. No intrahepatic or extrahepatic ductal dilatation. Pancreas: Within normal limits. Spleen: 1.8 cm hypoenhancing lesion along the inferior spleen (series 3/image 69), indeterminate. Adrenals/Urinary Tract: Adrenal glands are within normal limits. 2.2 cm mildly complex right lower pole renal cyst with a rim calcification and possible thin septation, measuring just higher than simple fluid density, but without convincing enhancement (series 3/image 93), benign (Bosniak II). Left kidney is within normal limits. No hydronephrosis. Bladder is underdistended but unremarkable. Stomach/Bowel: Large fungating mass in the gastric cardia/posterior gastric fundus, measuring approximately 5.3 x 9.1 x 5.5 cm (series 3/image 60), likely corresponding to the patient's newly diagnosed gastric cancer. No evidence of bowel obstruction. Jejunojejunal  intussusception in the right mid abdomen (series 3/image 103), likely transient/inconsequential. Appendix is not discretely visualized. Vascular/Lymphatic: No evidence of abdominal aortic aneurysm. No suspicious abdominopelvic lymphadenopathy. Reproductive: Prostate is grossly unremarkable. Other: Small volume pelvic ascites. Abnormal soft tissue in the left upper abdomen (series 3/image 66), reflecting extra gastric extension of tumor, including a dominant 3.5 x 4.2 cm peritoneal implant. Musculoskeletal: Mild degenerative changes of the lower lumbar spine. IMPRESSION: 9.1 cm fungating mass in the gastric cardia/posterior gastric fundus, corresponding to the patient's newly diagnosed gastric cancer. Associated extra gastric extension with peritoneal disease in the left upper abdomen, including a dominant 4.2 cm peritoneal implant. Small volume pelvic ascites. 1.8 cm hypoenhancing lesion along the inferior spleen, indeterminate. Electronically Signed   By: SJulian HyM.D.   On: 06/05/2017 09:02   CT CAP W Contrast 06/05/17 IMPRESSION: 9.1 cm fungating mass in the gastric cardia/posterior gastric fundus, corresponding to the patient's newly diagnosed gastric Cancer. Associated extra gastric extension with peritoneal disease in the left upper abdomen, including a dominant 4.2 cm peritoneal implant. Small volume pelvic ascites. 1.8 cm hypoenhancing lesion along the inferior spleen, indeterminate.  PROCEDURE  Esophagogastroduodenoscopy 06/04/17 by Dr. HBenson NorwayFindings: The esophagus was normal. A large, fungating and ulcerated, non-circumferential mass with no bleeding and no stigmata of recent bleeding was found in the cardia and in the gastric fundus. Biopsies  were taken with a cold forceps for histology. The examined duodenum was normal. A large friable fungating gastric mass involving the cardia and the fundus was identified. The mass extended approximately 5 cm. It was difficult to measure the width  of the lesion. There was a large necrotic and deep ulcer in the mid portion of the mass. It is estimated that it encompasses 25% of the fundic circumfirence. Multiple cold biopsies were obtained. Impression: Normal esophagus. Malignant gastric tumor in the cardia and in the  gastric fundus. Biopsied. Normal examined duodenum.  ASSESSMENT & PLAN:  Ryan Collins 57 y.o. male, without significant past medical history, but also does not  see doctors routinely, presented with worsening fatigue, intermittent epigastric pain and melena for 3-4 months, and 50 pounds weight loss over the past 6 months.  1.  Gastric cancer, T4bNxM1 with peritoneal metastasis, adenocarcinoma, MSI-pending  -I have reviewed his CT scan findings, endoscopy and biopsy results of the gastric mass with patient in details. He has a large mass in the cardia and in the gastric fundus, with evidence of extra gastric extension, and the peritoneum metastasis on CT scan.  No other distant metastasis. -Due to the peritoneal metastasis, unfortunately he is cancer is not curable at this stage. We discussed the role of HIPEC if he has excellent response to chemotherapy, and no evidence of other metastasis, I may consider refer him to Dr. Lennie Odor. Although due to the aggressive nature of gastric cancer, HIPEC is usually not offered.  -I will present his case in our GI tumor board, to get a surgical input. -I recommend him to consider systemic chemotherapy, to control his disease, and extend his life.  We discussed the benefit of chemo is palliative, to prolong his life and improve his quality of life.  Also he has had significant weight loss, intermittent abdominal pain, he is young and otherwise healthy, should be able to tolerate chemo well.  -I recommend FOLFOX or CAPOX as first line chemo.  Due to his significant gastric discomfort, and tolerance issue, I recommend him to do FOLFOX. --Chemotherapy consent: Side effects including but does not not  limited to, fatigue, nausea, vomiting, diarrhea, hair loss, neuropathy, fluid retention, renal and kidney dysfunction, neutropenic fever, needed for blood transfusion, bleeding, coronary artery spasm and heart attack, congestive heart failure, were discussed with patient in great detail. He agrees to proceed. -Goal of therapy is palliative. -His tumor to be tested for MSI, and PDL 1, HER-2, to see if he is a candidate for immunotherapy and anti-HER2 therapy. -I am concerned about his limited social support, and financial burden from his treatment.  I will refer him to our social worker, to discuss Medicaid and social benefit application -He is scheduled to see our dietitian today.  2.  Anemia of iron deficiency and tumor bleeding, B12 deficiency -He has clinical GI bleeding with melena, iron study also showed iron deficiency. He has received blood transfusion, and 1 dose of IV Feraheme- -I will set up a second dose of IV Feraheme within the next week -Monitor his CBC and iron studies closely -He will continue Oral-B complex   3. History of DVT, ?  Protein C deficiency -he was on coumadin for his DVT before, and previous labs showed a protein C deficiency, which increases his risk of thrombosis. -We discussed the high risk of thrombosis secondary to his underlying malignancy. -To his chronic GI bleeding from his tumor, I would not do prophylactic anticoagulation.  4. History of  alcohol and marijuana abuse -He has quit since his diagnosis of cancer  5. Goal of care discussion  -We again discussed the incurable nature of his cancer, and the overall poor prognosis, especially if he does not have good response to chemotherapy or progress on chemo -The patient understands the goal of care is palliative. -he is full code now     Orders Placed This Encounter  Procedures  . Ambulatory referral to Social Work    Referral Priority:   Routine    Referral Type:   Consultation    Referral Reason:    Specialty Services Required    Number of Visits Requested:   1     PLAN: -see dietician today -SW referral  -Chemo class, Lab and iv feraheme within next 7 days  -IR port placement next week -Lab, flush, f/u and chemo FOLFOX in 2 and 4 weeks  All questions were answered. The patient knows to call the clinic with any problems, questions or concerns. I spent 50 minutes counseling the patient face to face. The total time spent in the appointment was 65 minutes and more than 50% was on counseling.     [name], MD 06/11/2017 6:33 PM

## 2017-06-11 ENCOUNTER — Inpatient Hospital Stay: Payer: BLUE CROSS/BLUE SHIELD | Attending: Hematology | Admitting: Hematology

## 2017-06-11 ENCOUNTER — Telehealth: Payer: Self-pay | Admitting: Hematology

## 2017-06-11 ENCOUNTER — Encounter: Payer: Self-pay | Admitting: Hematology

## 2017-06-11 ENCOUNTER — Ambulatory Visit: Payer: BLUE CROSS/BLUE SHIELD | Admitting: Nutrition

## 2017-06-11 VITALS — BP 138/74 | HR 68 | Temp 98.5°F | Resp 18 | Ht 71.25 in | Wt 167.3 lb

## 2017-06-11 DIAGNOSIS — Z86718 Personal history of other venous thrombosis and embolism: Secondary | ICD-10-CM | POA: Insufficient documentation

## 2017-06-11 DIAGNOSIS — C186 Malignant neoplasm of descending colon: Secondary | ICD-10-CM

## 2017-06-11 DIAGNOSIS — C168 Malignant neoplasm of overlapping sites of stomach: Secondary | ICD-10-CM | POA: Insufficient documentation

## 2017-06-11 DIAGNOSIS — C786 Secondary malignant neoplasm of retroperitoneum and peritoneum: Secondary | ICD-10-CM | POA: Insufficient documentation

## 2017-06-11 DIAGNOSIS — D500 Iron deficiency anemia secondary to blood loss (chronic): Secondary | ICD-10-CM

## 2017-06-11 DIAGNOSIS — C161 Malignant neoplasm of fundus of stomach: Secondary | ICD-10-CM

## 2017-06-11 DIAGNOSIS — F1010 Alcohol abuse, uncomplicated: Secondary | ICD-10-CM

## 2017-06-11 DIAGNOSIS — Z7189 Other specified counseling: Secondary | ICD-10-CM | POA: Insufficient documentation

## 2017-06-11 DIAGNOSIS — E538 Deficiency of other specified B group vitamins: Secondary | ICD-10-CM

## 2017-06-11 MED ORDER — ONDANSETRON HCL 8 MG PO TABS: 8.0000 mg | ORAL_TABLET | Freq: Two times a day (BID) | ORAL | 1 refills | Status: DC | PRN

## 2017-06-11 MED ORDER — LIDOCAINE-PRILOCAINE 2.5-2.5 % EX CREA: TOPICAL_CREAM | CUTANEOUS | 3 refills | Status: DC

## 2017-06-11 MED ORDER — PROCHLORPERAZINE MALEATE 10 MG PO TABS: 10.0000 mg | ORAL_TABLET | Freq: Four times a day (QID) | ORAL | 1 refills | Status: DC | PRN

## 2017-06-11 NOTE — Telephone Encounter (Signed)
Gave avs and calendar for march however he will get feraheme at patient care center

## 2017-06-11 NOTE — Progress Notes (Signed)
  Oncology Nurse Navigator Documentation  Navigator Location: [facility] (06/11/17 1229)   )Navigator Encounter Type: Initial MedOnc (06/11/17 1229)   Abnormal Finding Date: 06/03/17 (06/11/17 1229) Confirmed Diagnosis Date: 06/04/17 (06/11/17 1229)               Patient Visit Type: MedOnc;Initial (06/11/17 1229)   Barriers/Navigation Needs: Financial (06/11/17 1229)   Interventions: Referrals;Psycho-social support (06/11/17 1229)  Met with patient briefly at end of initial med/onc appointment and walked patient to Nutrition appointment.  I introduced my role as GI Navigator and encouraged patient that our team would be working on helping him with barriers to treatment. Dr. Burr Medico placed a social work referral and I will follow-up Encouragement and support provided. Patient has my contact information and verbalized understanding that he can call me with questions or concerns.     Time Spent with Patient: 15 (06/11/17 1229)

## 2017-06-11 NOTE — Progress Notes (Signed)
START ON PATHWAY REGIMEN - Gastroesophageal     A cycle is every 14 days:     Oxaliplatin      Leucovorin      5-Fluorouracil      5-Fluorouracil   **Always confirm dose/schedule in your pharmacy ordering system**    Patient Characteristics: Distant Metastases (cM1/pM1) / Locally Recurrent Disease, Adenocarcinoma - Esophageal, [place], and Gastric, First Line, HER2 Negative / Unknown Histology: Adenocarcinoma Disease Classification: Gastric Therapeutic Status: Distant Metastases (No Additional Staging) Would you be surprised if this patient died  in the next year<= I would NOT be surprised if this patient died in the next year Line of Therapy: First Line HER2 Status: Awaiting Test Results Intent of Therapy: Non-Curative / Palliative Intent, Discussed with Patient 

## 2017-06-11 NOTE — Progress Notes (Signed)
57 year old male diagnosed with gastric cancer.  He is a patient of Dr. Burr Medico.  Past medical history includes DVT.   Patient has history of drinking 2-3 40 ounce beers daily.  Medications include Protonix and thiamine.  Labs include sodium 133, albumin 2.4.  Height: 5 feet 11-1/4 inch. Weight: 170 pounds. Usual body weight: 220 pounds per patient. BMI: 23.54.  Patient reports he has lost approximately 50 pounds over one year.  This was unintentional.   Reports he has early satiety and is not able to eat large portions. Patient reports he enjoys oral nutrition supplements. He feels overwhelmed with diagnosis and treatment plan. Therefore, physical exam deferred.  Nutrition diagnosis:  Unintended weight loss related to gastric cancer as evidenced by 50 pound weight loss over one year.  Intervention: Patient was educated to consume smaller more frequent meals and snacks, utilizing high-calorie, high-protein foods. Recommended patient drink oral nutrition supplements 3 times a day between meals as his snack. Provided oral nutrition supplement samples and coupons. Provided fact sheets on increasing calories and protein, and protein list of high protein foods. Questions were answered.  Teach back method used.  Monitoring, evaluation, goals: Patient will tolerate increased calories and protein to promote weight maintenance.  Next visit:Wednesday, February 13 during infusion.  **Disclaimer: This note was dictated with voice recognition software. Similar sounding words can inadvertently be transcribed and this note may contain transcription errors which may not have been corrected upon publication of note.**

## 2017-06-12 ENCOUNTER — Encounter: Payer: Self-pay | Admitting: Family Medicine

## 2017-06-12 ENCOUNTER — Ambulatory Visit (INDEPENDENT_AMBULATORY_CARE_PROVIDER_SITE_OTHER): Payer: BLUE CROSS/BLUE SHIELD | Admitting: Family Medicine

## 2017-06-12 ENCOUNTER — Other Ambulatory Visit: Payer: Self-pay | Admitting: *Deleted

## 2017-06-12 ENCOUNTER — Ambulatory Visit (HOSPITAL_COMMUNITY)
Admission: RE | Admit: 2017-06-12 | Discharge: 2017-06-12 | Disposition: A | Discharge status: Home / Self Care | Payer: BLUE CROSS/BLUE SHIELD | Source: Ambulatory Visit | Attending: Hematology | Admitting: Hematology

## 2017-06-12 ENCOUNTER — Encounter: Payer: Self-pay | Admitting: General Practice

## 2017-06-12 VITALS — BP 140/75 | HR 59 | Temp 98.0°F | Resp 16 | Ht 71.5 in | Wt 169.0 lb

## 2017-06-12 DIAGNOSIS — I10 Essential (primary) hypertension: Secondary | ICD-10-CM | POA: Insufficient documentation

## 2017-06-12 DIAGNOSIS — D500 Iron deficiency anemia secondary to blood loss (chronic): Secondary | ICD-10-CM

## 2017-06-12 DIAGNOSIS — Z1159 Encounter for screening for other viral diseases: Secondary | ICD-10-CM | POA: Diagnosis not present

## 2017-06-12 DIAGNOSIS — D72829 Elevated white blood cell count, unspecified: Secondary | ICD-10-CM

## 2017-06-12 DIAGNOSIS — E538 Deficiency of other specified B group vitamins: Secondary | ICD-10-CM | POA: Diagnosis not present

## 2017-06-12 DIAGNOSIS — D509 Iron deficiency anemia, unspecified: Secondary | ICD-10-CM | POA: Insufficient documentation

## 2017-06-12 DIAGNOSIS — C161 Malignant neoplasm of fundus of stomach: Secondary | ICD-10-CM

## 2017-06-12 DIAGNOSIS — Z23 Encounter for immunization: Secondary | ICD-10-CM | POA: Diagnosis not present

## 2017-06-12 MED ORDER — SODIUM CHLORIDE 0.9 % IV SOLN: 510.0000 mg | Freq: Once | INTRAVENOUS | Status: DC

## 2017-06-12 MED ORDER — AMLODIPINE BESYLATE 5 MG PO TABS: 5.0000 mg | ORAL_TABLET | Freq: Every day | ORAL | 0 refills | Status: DC

## 2017-06-12 MED ORDER — SODIUM CHLORIDE 0.9 % IV SOLN
510.0000 mg | Freq: Once | INTRAVENOUS | Status: AC
  Administered 2017-06-12: 510 mg via INTRAVENOUS
  Filled 2017-06-12: qty 17

## 2017-06-12 NOTE — Discharge Instructions (Signed)

## 2017-06-12 NOTE — Patient Instructions (Signed)
Thanks for establishing care today.  Your blood pressure has been consistently elevated, will start antihypertensive medication on today.  You will start amlodipine 5 mg daily with breakfast.  Monitor sodium intake.  Will provide information on DASH diet. We will transition to the day infusion center for your second iron infusion per Dr. Burr Medico, oncologist.  Dennis Bast are scheduled for labs prior to starting chemotherapy next week. We will follow-up in 1 week for blood pressure check.  Return to clinic in 1 month for hypertension and a prostate exam.  He received 2 vaccinations on today: Flu vaccination and pneumococcal 23. I also sent a referral to ophthalmology for routine eye exam Recommend that you schedule appointment for yearly dental exam. DASH Eating Plan DASH stands for "Dietary Approaches to Stop Hypertension." The DASH eating plan is a healthy eating plan that has been shown to reduce high blood pressure (hypertension). It may also reduce your risk for type 2 diabetes, heart disease, and stroke. The DASH eating plan may also help with weight loss. What are tips for following this plan? General guidelines  Avoid eating more than 2,300 mg (milligrams) of salt (sodium) a day. If you have hypertension, you may need to reduce your sodium intake to 1,500 mg a day.  Limit alcohol intake to no more than 1 drink a day for nonpregnant women and 2 drinks a day for men. One drink equals 12 oz of beer, 5 oz of wine, or 1 oz of hard liquor.  Work with your health care provider to maintain a healthy body weight or to lose weight. Ask what an ideal weight is for you.  Get at least 30 minutes of exercise that causes your heart to beat faster (aerobic exercise) most days of the week. Activities may include walking, swimming, or biking.  Work with your health care provider or diet and nutrition specialist (dietitian) to adjust your eating plan to your individual calorie needs. Reading food labels  Check food  labels for the amount of sodium per serving. Choose foods with less than 5 percent of the Daily Value of sodium. Generally, foods with less than 300 mg of sodium per serving fit into this eating plan.  To find whole grains, look for the word "whole" as the first word in the ingredient list. Shopping  Buy products labeled as "low-sodium" or "no salt added."  Buy fresh foods. Avoid canned foods and premade or frozen meals. Cooking  Avoid adding salt when cooking. Use salt-free seasonings or herbs instead of table salt or sea salt. Check with your health care provider or pharmacist before using salt substitutes.  Do not fry foods. Cook foods using healthy methods such as baking, boiling, grilling, and broiling instead.  Cook with heart-healthy oils, such as olive, canola, soybean, or sunflower oil. Meal planning   Eat a balanced diet that includes: ? 5 or more servings of fruits and vegetables each day. At each meal, try to fill half of your plate with fruits and vegetables. ? Up to 6-8 servings of whole grains each day. ? Less than 6 oz of lean meat, poultry, or fish each day. A 3-oz serving of meat is about the same size as a deck of cards. One egg equals 1 oz. ? 2 servings of low-fat dairy each day. ? A serving of nuts, seeds, or beans 5 times each week. ? Heart-healthy fats. Healthy fats called Omega-3 fatty acids are found in foods such as flaxseeds and coldwater fish, like sardines, salmon,  and mackerel.  Limit how much you eat of the following: ? Canned or prepackaged foods. ? Food that is high in trans fat, such as fried foods. ? Food that is high in saturated fat, such as fatty meat. ? Sweets, desserts, sugary drinks, and other foods with added sugar. ? Full-fat dairy products.  Do not salt foods before eating.  Try to eat at least 2 vegetarian meals each week.  Eat more home-cooked food and less restaurant, buffet, and fast food.  When eating at a restaurant, ask that  your food be prepared with less salt or no salt, if possible. What foods are recommended? The items listed may not be a complete list. Talk with your dietitian about what dietary choices are best for you. Grains Whole-grain or whole-wheat bread. Whole-grain or whole-wheat pasta. Brown rice. Modena Morrow. Bulgur. Whole-grain and low-sodium cereals. Pita bread. Low-fat, low-sodium crackers. Whole-wheat flour tortillas. Vegetables Fresh or frozen vegetables (raw, steamed, roasted, or grilled). Low-sodium or reduced-sodium tomato and vegetable juice. Low-sodium or reduced-sodium tomato sauce and tomato paste. Low-sodium or reduced-sodium canned vegetables. Fruits All fresh, dried, or frozen fruit. Canned fruit in natural juice (without added sugar). Meat and other protein foods Skinless chicken or [place]. Ground chicken or [place]. Pork with fat trimmed off. Fish and seafood. Egg whites. Dried beans, peas, or lentils. Unsalted nuts, nut butters, and seeds. Unsalted canned beans. Lean cuts of beef with fat trimmed off. Low-sodium, lean deli meat. Dairy Low-fat (1%) or fat-free (skim) milk. Fat-free, low-fat, or reduced-fat cheeses. Nonfat, low-sodium ricotta or cottage cheese. Low-fat or nonfat yogurt. Low-fat, low-sodium cheese. Fats and oils Soft margarine without trans fats. Vegetable oil. Low-fat, reduced-fat, or light mayonnaise and salad dressings (reduced-sodium). Canola, safflower, olive, soybean, and sunflower oils. Avocado. Seasoning and other foods Herbs. Spices. Seasoning mixes without salt. Unsalted popcorn and pretzels. Fat-free sweets. What foods are not recommended? The items listed may not be a complete list. Talk with your dietitian about what dietary choices are best for you. Grains Baked goods made with fat, such as croissants, muffins, or some breads. Dry pasta or rice meal packs. Vegetables Creamed or fried vegetables. Vegetables in a cheese sauce. Regular canned vegetables  (not low-sodium or reduced-sodium). Regular canned tomato sauce and paste (not low-sodium or reduced-sodium). Regular tomato and vegetable juice (not low-sodium or reduced-sodium). Angie Fava. Olives. Fruits Canned fruit in a light or heavy syrup. Fried fruit. Fruit in cream or butter sauce. Meat and other protein foods Fatty cuts of meat. Ribs. Fried meat. Berniece Salines. Sausage. Bologna and other processed lunch meats. Salami. Fatback. Hotdogs. Bratwurst. Salted nuts and seeds. Canned beans with added salt. Canned or smoked fish. Whole eggs or egg yolks. Chicken or [place] with skin. Dairy Whole or 2% milk, cream, and half-and-half. Whole or full-fat cream cheese. Whole-fat or sweetened yogurt. Full-fat cheese. Nondairy creamers. Whipped toppings. Processed cheese and cheese spreads. Fats and oils Butter. Stick margarine. Lard. Shortening. Ghee. Bacon fat. Tropical oils, such as coconut, palm kernel, or palm oil. Seasoning and other foods Salted popcorn and pretzels. Onion salt, garlic salt, seasoned salt, table salt, and sea salt. Worcestershire sauce. Tartar sauce. Barbecue sauce. Teriyaki sauce. Soy sauce, including reduced-sodium. Steak sauce. Canned and packaged gravies. Fish sauce. Oyster sauce. Cocktail sauce. Horseradish that you find on the shelf. Ketchup. Mustard. Meat flavorings and tenderizers. Bouillon cubes. Hot sauce and Tabasco sauce. Premade or packaged marinades. Premade or packaged taco seasonings. Relishes. Regular salad dressings. Where to find more information:  National Heart,  Lung, and Blood Institute: https://wilson-eaton.com/  American Heart Association: www.heart.org Summary  The DASH eating plan is a healthy eating plan that has been shown to reduce high blood pressure (hypertension). It may also reduce your risk for type 2 diabetes, heart disease, and stroke.  With the DASH eating plan, you should limit salt (sodium) intake to 2,300 mg a day. If you have hypertension, you may need to  reduce your sodium intake to 1,500 mg a day.  When on the DASH eating plan, aim to eat more fresh fruits and vegetables, whole grains, lean proteins, low-fat dairy, and heart-healthy fats.  Work with your health care provider or diet and nutrition specialist (dietitian) to adjust your eating plan to your individual calorie needs. This information is not intended to replace advice given to you by your health care provider. Make sure you discuss any questions you have with your health care provider. Document Released: 04/19/2011 Document Revised: 04/23/2016 Document Reviewed: 04/23/2016 Elsevier Interactive Patient Education  [employer].

## 2017-06-12 NOTE — Progress Notes (Signed)
Patient here today for FeraHeme infusion.   9:58 am - Infusion complete and patient tolerated well without complications.  Will await 30 minutes and check vitals.  Burna Forts, BSN, RN-BC (MSN Graduate Student - [facility]

## 2017-06-12 NOTE — Progress Notes (Signed)
[place] CSW Progress Note  Referral received for social work consult to address financial and adjustment to diagnosis concerns.  CSW attempted to contact patient however VM was full and unable to accept messages.  CSW will contact chemo ed RN to see if patient can stop by [place] after chemo ed in order to begin to assess and address needs.  Edwyna Shell, LCSW Clinical Social Worker Phone:  [phone]

## 2017-06-12 NOTE — Progress Notes (Signed)
Pt arrived for infusion of IV feraheme per physician order; infusion completed with no complications noted; pt observed for 30 minutes post infusion with no complications noted; discharge instructions explained, given, and signed; pt alert and oriented upon discharge  Ordering Provider: Dr. Ky Barban, MD  Associated Diagnosis: Iron deficiency anemia due to chronic blood loss

## 2017-06-12 NOTE — Progress Notes (Signed)
Subjective:    Patient ID: Ryan Collins, male    DOB: [birth year], 57 y.o.   MRN: [id number]  HPI Ryan Collins, a 57 year old male without significant medical history presents to establish care.  Patient has not had a primary provider and mostly use urgent care for all primary needs.  He was recently evaluated in the emergency department for worsening fatigue, epigastric pain and melena for 3 months.  Patient also endorses a 50 pound weight loss over the past 4-6 months.  While under the care of inpatient services, patient was found to have a malignant gastric tumor in the cardia and in the gastric fundus.  Patient has establish care with Dr. Truitt Collins, oncologist for further workup and evaluation of malignant gastric tumor.  Palliative care was also consulted due to suspected poor prognosis of newly diagnosed gastric adenocarcinoma. Patient also has a history of symptomatic anemia.  He states that overall fatigue is improved since previous blood transfusions.  He was last transfused 2 units of packed red blood cells on 06/05/2017.  Patient was also infused Feraheme on 06/03/2017.  Hemoglobin increased to 9.1 from 7.5 after receiving packed red blood cells.  Patient is scheduled to receive additional unit of Feraheme on today.  Patient continues to have dark stools which are suspected to be related to gastric tumor. Patient currently resides with his brother, who is his primary support.  He has multiple brothers and sisters that make up his support system.  He was previously employed in [employer], patient has not work since diagnosis.  IMPRESSION: 9.1 cm fungating mass in the gastric cardia/posterior gastric fundus, corresponding to the patient's newly diagnosed gastric cancer.  Associated extra gastric extension with peritoneal disease in the left upper abdomen, including a dominant 4.2 cm peritoneal implant.  Small volume pelvic ascites.  1.8 cm hypoenhancing lesion along the inferior  spleen, indeterminate.  Social History   Socioeconomic History  . Marital status: Legally Separated    Spouse name: Not on file  . Number of children: Not on file  . Years of education: Not on file  . Highest education level: Not on file  Social Needs  . Financial resource strain: Not on file  . Food insecurity - worry: Not on file  . Food insecurity - inability: Not on file  . Transportation needs - medical: Not on file  . Transportation needs - non-medical: Not on file  Occupational History  . Not on file  Tobacco Use  . Smoking status: Never Smoker  . Smokeless tobacco: Never Used  Substance and Sexual Activity  . Alcohol use: Yes    Alcohol/week: 3.0 oz    Types: 5 Cans of beer per week    Comment: 05/2017 i QUIT DRINKING 4 MONTHS AGO "  . Drug use: Yes    Types: Marijuana    Comment: occ  . Sexual activity: Not on file  Other Topics Concern  . Not on file  Social History Narrative  . Not on file   There is no immunization history on file for this patient.  Review of Systems  HENT: Negative.   Respiratory: Negative.   Cardiovascular: Negative.   Gastrointestinal: Negative.   Endocrine: Negative for polydipsia, polyphagia and polyuria.  Musculoskeletal: Negative.   Allergic/Immunologic: Positive for immunocompromised state (gastric cancer).  Neurological: Negative.   Hematological: Negative.   Psychiatric/Behavioral: Negative.        Objective:   Physical Exam  Constitutional: He is oriented to person, place, and time.  HENT:  Head: Normocephalic and atraumatic.  Right Ear: External ear normal.  Nose: Nose normal.  Mouth/Throat: Oropharynx is clear and moist.  Pulmonary/Chest: Effort normal and breath sounds normal.  Abdominal: Soft. Bowel sounds are normal. There is generalized tenderness.  Neurological: He is oriented to person, place, and time. He has normal reflexes.  Skin: Skin is dry.  Psychiatric: He has a normal mood and affect. His behavior  is normal. Judgment and thought content normal.       BP 140/75 (BP Location: Left Arm, Patient Position: Sitting, Cuff Size: Normal)   Pulse (!) 59   Temp 98 F (36.7 C) (Oral)   Resp 16   Ht 5' 11.5" (1.816 m)   Wt 169 lb (76.7 kg)   SpO2 100%   BMI 23.24 kg/m  Assessment & Plan:   1. Essential hypertension Reviewed previous flow sheets, blood pressure is consistently elevated.  Will start a calcium channel blocker on today.  Patient will return in 1 week for blood pressure check.  Discussed potential side effects at length.  Patient states that he typically uses a great deal of salt.  Recommend a DASH diet.  Given written information. - amLODipine (NORVASC) 5 MG tablet; Take 1 tablet (5 mg total) by mouth daily.  Dispense: 30 tablet; Refill: 0 - Ambulatory referral to Ophthalmology  2. Malignant neoplasm of fundus of stomach [facility]) Patient is followed by Dr. Truitt Collins, oncologist.  He is scheduled to start chemotherapy in 1 week including oxalic platinum, leucovorin and 5-fluorouracil.  Cycle will be every 14 days.  3. Leukocytosis, unspecified type Patient has a lab schedule and oncology in 1 week.  4. Influenza vaccine needed - Flu Vaccine QUAD 36+ mos IM (Fluarix & Fluzone Quad PF  5. Need for hepatitis C screening test - Hepatitis C Antibody  6. Immunization due - Pneumococcal polysaccharide vaccine 23-valent greater than or equal to 2yo subcutaneous/IM  7. Iron deficiency anemia due to chronic blood loss He has clinical GI bleeding with melena, iron study also showed iron deficiency. He has previously received blood transfusion, and 1 dose of IV Feraheme Will receive IV Feraheme on today in clinic per Dr. Burr Collins Will monitor his CBC and iron studies closely   RTC: Patient will return to clinic in 1 week for blood pressure check, will return in 1 month for hypertension and prostate exam.  Discussed the importance of following up with oncologist consistently   Ryan Pounds  MSN, [facility] [address] [place], [place] [address] [phone]

## 2017-06-13 ENCOUNTER — Inpatient Hospital Stay: Payer: BLUE CROSS/BLUE SHIELD

## 2017-06-13 ENCOUNTER — Encounter: Payer: Self-pay | Admitting: *Deleted

## 2017-06-13 ENCOUNTER — Other Ambulatory Visit: Payer: Self-pay | Admitting: Hematology

## 2017-06-13 ENCOUNTER — Encounter: Payer: Self-pay | Admitting: General Practice

## 2017-06-13 DIAGNOSIS — C161 Malignant neoplasm of fundus of stomach: Secondary | ICD-10-CM

## 2017-06-13 DIAGNOSIS — C168 Malignant neoplasm of overlapping sites of stomach: Secondary | ICD-10-CM | POA: Diagnosis not present

## 2017-06-13 LAB — CEA (IN HOUSE-CHCC): CEA (CHCC-IN HOUSE): 53.01 ng/mL — AB (ref 0.00–5.00)

## 2017-06-13 LAB — CBC WITH DIFFERENTIAL (CANCER CENTER ONLY)
BASOS ABS: 0 10*3/uL (ref 0.0–0.1)
Basophils Relative: 0 %
Eosinophils Absolute: 0 10*3/uL (ref 0.0–0.5)
Eosinophils Relative: 0 %
HEMATOCRIT: 31.6 % — AB (ref 38.4–49.9)
HEMOGLOBIN: 10.1 g/dL — AB (ref 13.0–17.1)
LYMPHS ABS: 1.4 10*3/uL (ref 0.9–3.3)
LYMPHS PCT: 10 %
MCH: 25.2 pg — AB (ref 27.2–33.4)
MCHC: 32 g/dL (ref 32.0–36.0)
MCV: 78.8 fL — AB (ref 79.3–98.0)
Monocytes Absolute: 1.3 10*3/uL — ABNORMAL HIGH (ref 0.1–0.9)
Monocytes Relative: 10 %
NEUTROS ABS: 10.6 10*3/uL — AB (ref 1.5–6.5)
NEUTROS PCT: 80 %
Platelet Count: 404 10*3/uL — ABNORMAL HIGH (ref 140–400)
RBC: 4.01 MIL/uL — AB (ref 4.20–5.82)
RDW: 24 % — ABNORMAL HIGH (ref 11.0–14.6)
WBC: 13.3 10*3/uL — AB (ref 4.0–10.3)

## 2017-06-13 LAB — CMP (CANCER CENTER ONLY)
ALK PHOS: 52 U/L (ref 40–150)
ALT: 4 U/L (ref 0–55)
AST: 12 U/L (ref 5–34)
Albumin: 2.8 g/dL — ABNORMAL LOW (ref 3.5–5.0)
Anion gap: 5 (ref 3–11)
BUN: 13 mg/dL (ref 7–26)
CO2: 26 mmol/L (ref 22–29)
CREATININE: 0.83 mg/dL (ref 0.70–1.30)
Calcium: 8.3 mg/dL — ABNORMAL LOW (ref 8.4–10.4)
Chloride: 105 mmol/L (ref 98–109)
Glucose, Bld: 86 mg/dL (ref 70–140)
Potassium: 3.9 mmol/L (ref 3.5–5.1)
Sodium: 136 mmol/L (ref 136–145)
Total Bilirubin: 0.3 mg/dL (ref 0.2–1.2)
Total Protein: 6 g/dL — ABNORMAL LOW (ref 6.4–8.3)

## 2017-06-13 LAB — HEPATITIS C ANTIBODY: Hep C Virus Ab: <0.1 s/co ratio (ref 0.0–0.9)

## 2017-06-13 NOTE — Progress Notes (Signed)
[place] CSW Progress Note  CSW met w patient in office to assess psychosocial needs. Patient lives w brother in trailer, does not anticipate any concerns w housing or utilities.  Parents live nearby and are able to assist w transportation if he is unable to drive.  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  Completed application for disability assistance and submitted to [facility]. Patient reports food insecurity, plans to apply for Food Stamps.  Gave food bag as well as resources for food pantrys/give aways in local area.  Patient may benefit from [facility], sent message to [employer] asking them to schedule assessment w him.  Of note, his sister was a [facility] patient and died of metastatic breast cancer; patient is aware of her course of treatment and fears similar outcome for his case.  Patient has cell phone but cannot set up VM.  Registration completed DPR w patient - patient agreeable to have messages re appointments and information left w mother who lives nearby and can contact him.  CSW will continue to follow him when he returns for upcoming visits.  Given additional information on [place], encouraged to attend GI Support Group.  Edwyna Shell, LCSW Clinical Social Worker Phone:  [phone]

## 2017-06-19 ENCOUNTER — Ambulatory Visit (INDEPENDENT_AMBULATORY_CARE_PROVIDER_SITE_OTHER): Payer: BLUE CROSS/BLUE SHIELD | Admitting: Family Medicine

## 2017-06-19 ENCOUNTER — Other Ambulatory Visit: Payer: Self-pay | Admitting: Radiology

## 2017-06-19 VITALS — BP 134/84 | HR 82

## 2017-06-19 DIAGNOSIS — I10 Essential (primary) hypertension: Secondary | ICD-10-CM | POA: Diagnosis not present

## 2017-06-19 NOTE — Progress Notes (Signed)
Provider was aware of blood pressure and advise patient to stay on current medication dosage.

## 2017-06-20 ENCOUNTER — Ambulatory Visit (HOSPITAL_COMMUNITY)
Admission: RE | Admit: 2017-06-20 | Discharge: 2017-06-20 | Disposition: A | Discharge status: Home / Self Care | Payer: BLUE CROSS/BLUE SHIELD | Source: Ambulatory Visit | Attending: Hematology | Admitting: Hematology

## 2017-06-20 ENCOUNTER — Encounter (HOSPITAL_COMMUNITY): Payer: Self-pay

## 2017-06-20 ENCOUNTER — Other Ambulatory Visit: Payer: Self-pay | Admitting: Hematology

## 2017-06-20 DIAGNOSIS — Z86718 Personal history of other venous thrombosis and embolism: Secondary | ICD-10-CM | POA: Diagnosis not present

## 2017-06-20 DIAGNOSIS — C169 Malignant neoplasm of stomach, unspecified: Secondary | ICD-10-CM | POA: Insufficient documentation

## 2017-06-20 DIAGNOSIS — C161 Malignant neoplasm of fundus of stomach: Secondary | ICD-10-CM

## 2017-06-20 HISTORY — PX: IR FLUORO GUIDE PORT INSERTION RIGHT: IMG5741

## 2017-06-20 HISTORY — PX: IR US GUIDE VASC ACCESS RIGHT: IMG2390

## 2017-06-20 LAB — CBC WITH DIFFERENTIAL/PLATELET
Basophils Absolute: 0 10*3/uL (ref 0.0–0.1)
Basophils Relative: 0 %
EOS PCT: 0 %
Eosinophils Absolute: 0 10*3/uL (ref 0.0–0.7)
HCT: 34.7 % — ABNORMAL LOW (ref 39.0–52.0)
Hemoglobin: 11 g/dL — ABNORMAL LOW (ref 13.0–17.0)
Lymphocytes Relative: 19 %
Lymphs Abs: 2 10*3/uL (ref 0.7–4.0)
MCH: 24.8 pg — ABNORMAL LOW (ref 26.0–34.0)
MCHC: 31.7 g/dL (ref 30.0–36.0)
MCV: 78.3 fL (ref 78.0–100.0)
MONO ABS: 1 10*3/uL (ref 0.1–1.0)
MONOS PCT: 9 %
NEUTROS PCT: 72 %
Neutro Abs: 7.6 10*3/uL (ref 1.7–7.7)
PLATELETS: 651 10*3/uL — AB (ref 150–400)
RBC: 4.43 MIL/uL (ref 4.22–5.81)
RDW: 24 % — ABNORMAL HIGH (ref 11.5–15.5)
WBC: 10.6 10*3/uL — AB (ref 4.0–10.5)

## 2017-06-20 MED ORDER — FENTANYL CITRATE (PF) 100 MCG/2ML IJ SOLN
INTRAMUSCULAR | Status: AC
  Filled 2017-06-20: qty 4

## 2017-06-20 MED ORDER — HEPARIN SOD (PORK) LOCK FLUSH 100 UNIT/ML IV SOLN
INTRAVENOUS | Status: AC | PRN
  Administered 2017-06-20: 500 [IU] via INTRAVENOUS

## 2017-06-20 MED ORDER — LIDOCAINE-EPINEPHRINE (PF) 2 %-1:200000 IJ SOLN
INTRAMUSCULAR | Status: AC | PRN
  Administered 2017-06-20: 20 mL

## 2017-06-20 MED ORDER — FENTANYL CITRATE (PF) 100 MCG/2ML IJ SOLN
INTRAMUSCULAR | Status: AC | PRN
  Administered 2017-06-20 (×2): 50 ug via INTRAVENOUS

## 2017-06-20 MED ORDER — LIDOCAINE-EPINEPHRINE (PF) 2 %-1:200000 IJ SOLN
INTRAMUSCULAR | Status: AC
  Filled 2017-06-20: qty 20

## 2017-06-20 MED ORDER — SODIUM CHLORIDE 0.9 % IV SOLN
INTRAVENOUS | Status: DC
  Administered 2017-06-20: 08:00:00 via INTRAVENOUS

## 2017-06-20 MED ORDER — HEPARIN SOD (PORK) LOCK FLUSH 100 UNIT/ML IV SOLN
INTRAVENOUS | Status: AC
  Filled 2017-06-20: qty 5

## 2017-06-20 MED ORDER — CEFAZOLIN SODIUM-DEXTROSE 2-4 GM/100ML-% IV SOLN
2.0000 g | INTRAVENOUS | Status: AC
  Administered 2017-06-20: 2 g via INTRAVENOUS

## 2017-06-20 MED ORDER — CEFAZOLIN SODIUM-DEXTROSE 2-4 GM/100ML-% IV SOLN
INTRAVENOUS | Status: AC
  Filled 2017-06-20: qty 100

## 2017-06-20 MED ORDER — MIDAZOLAM HCL 2 MG/2ML IJ SOLN
INTRAMUSCULAR | Status: AC
Start: 2017-06-20 — End: 2017-06-20
  Filled 2017-06-20: qty 4

## 2017-06-20 MED ORDER — MIDAZOLAM HCL 2 MG/2ML IJ SOLN
INTRAMUSCULAR | Status: AC | PRN
  Administered 2017-06-20 (×2): 1 mg via INTRAVENOUS

## 2017-06-20 NOTE — Sedation Documentation (Signed)
Patient is resting comfortably. 

## 2017-06-20 NOTE — Procedures (Signed)
  Procedure: R IJ PowerPort placed   EBL:   minimal Complications:  none immediate  See full dictation in [facility] PACS.  D. Zykeria Laguardia MD Main # [phone] Pager  [phone]    

## 2017-06-20 NOTE — Discharge Instructions (Addendum)
Moderate Conscious Sedation, Adult, Care After These instructions provide you with information about caring for yourself after your procedure. Your health care provider may also give you more specific instructions. Your treatment has been planned according to current medical practices, but problems sometimes occur. Call your health care provider if you have any problems or questions after your procedure. What can I expect after the procedure? After your procedure, it is common:  To feel sleepy for several hours.  To feel clumsy and have poor balance for several hours.  To have poor judgment for several hours.  To vomit if you eat too soon.  Follow these instructions at home: For at least 24 hours after the procedure:   Do not: ? Participate in activities where you could fall or become injured. ? Drive. ? Use heavy machinery. ? Drink alcohol. ? Take sleeping pills or medicines that cause drowsiness. ? Make important decisions or sign legal documents. ? Take care of children on your own.  Rest. Eating and drinking  Follow the diet recommended by your health care provider.  If you vomit: ? Drink water, juice, or soup when you can drink without vomiting. ? Make sure you have little or no nausea before eating solid foods. General instructions  Have a responsible adult stay with you until you are awake and alert.  Take over-the-counter and prescription medicines only as told by your health care provider.  If you smoke, do not smoke without supervision.  Keep all follow-up visits as told by your health care provider. This is important. Contact a health care provider if:  You keep feeling nauseous or you keep vomiting.  You feel light-headed.  You develop a rash.  You have a fever. Get help right away if:  You have trouble breathing. This information is not intended to replace advice given to you by your health care provider. Make sure you discuss any questions you  have with your health care provider. Document Released: 02/18/2013 Document Revised: 10/03/2015 Document Reviewed: 08/20/2015 Elsevier Interactive Patient Education  2018 [place] Insertion, Care After This sheet gives you information about how to care for yourself after your procedure. Your health care provider may also give you more specific instructions. If you have problems or questions, contact your health care provider. What can I expect after the procedure? After your procedure, it is common to have:  Discomfort at the port insertion site.  Bruising on the skin over the port. This should improve over 3-4 days.  Follow these instructions at home: [facility] care  After your port is placed, you will get a manufacturer's information card. The card has information about your port. Keep this card with you at all times.  Take care of the port as told by your health care provider. Ask your health care provider if you or a family member can get training for taking care of the port at home. A home health care nurse may also take care of the port.  Make sure to remember what type of port you have. Incision care  Follow instructions from your health care provider about how to take care of your port insertion site. Make sure you: ? Wash your hands with soap and water before you change your bandage (dressing). If soap and water are not available, use hand sanitizer. ? Change your dressing as told by your health care provider.  You may remove your dressing tomorrow. ? Leave skin glue in place. These skin closures  may need to stay in place for 2 weeks or longer. If adhesive strip edges start to loosen and curl up, you may trim the loose edges. Do not remove adhesive strips completely unless your health care provider tells you to do that.  DO NOT use EMLA cream for 2 weeks after port placement as this will remove surgical glue on your incision.  Check your port insertion site  every day for signs of infection. Check for: ? More redness, swelling, or pain. ? More fluid or blood. ? Warmth. ? Pus or a bad smell. General instructions  Do not take baths, swim, or use a hot tub until your health care provider approves.  You may shower tomorrow.  Do not lift anything that is heavier than 10 lb (4.5 kg) for a week, or as told by your health care provider.  Ask your health care provider when it is okay to: ? Return to work or school. ? Resume usual physical activities or sports.  Do not drive for 24 hours if you were given a medicine to help you relax (sedative).  Take over-the-counter and prescription medicines only as told by your health care provider.  Wear a medical alert bracelet in case of an emergency. This will tell any health care providers that you have a port.  Keep all follow-up visits as told by your health care provider. This is important. Contact a health care provider if:  You have a fever or chills.  You have more redness, swelling, or pain around your port insertion site.  You have more fluid or blood coming from your port insertion site.  Your port insertion site feels warm to the touch.  You have pus or a bad smell coming from the port insertion site. Get help right away if:  You have chest pain or shortness of breath.  You have bleeding from your port that you cannot control. Summary  Take care of the port as told by your health care provider.  Change your dressing as told by your health care provider.  Keep all follow-up visits as told by your health care provider. This information is not intended to replace advice given to you by your health care provider. Make sure you discuss any questions you have with your health care provider. Document Released: 02/18/2013 Document Revised: 03/21/2016 Document Reviewed: 03/21/2016 Elsevier Interactive Patient Education  2017 [employer].

## 2017-06-20 NOTE — Consult Note (Signed)
Chief Complaint: Patient was seen in consultation today for Port-A-Cath placement  Referring Physician(s): Feng,Yan  Supervising Physician: Arne Cleveland  Patient Status: [place]  History of Present Illness: Ryan Collins is a 57 y.o. male with history of newly diagnosed gastric carcinoma who presents today for Port-A-Cath placement for palliative chemotherapy.  Past Medical History:  Diagnosis Date  . DVT (deep venous thrombosis) ([place]) 2011  . GI bleeding 2001  . History of DVT of lower extremity   . Melena 05/2017  . Stomach ulcer    Clinically suspected; No EGD confirmation as of 06/03/17    Past Surgical History:  Procedure Laterality Date  . ESOPHAGOGASTRODUODENOSCOPY  06/04/2017  . ESOPHAGOGASTRODUODENOSCOPY N/A 06/04/2017   Procedure: ESOPHAGOGASTRODUODENOSCOPY (EGD);  Surgeon: Carol Ada, MD;  Location: [facility];  Service: Endoscopy;  Laterality: N/A;    Allergies: Patient has no known allergies.  Medications: Prior to Admission medications   Medication Sig Start Date End Date Taking? Authorizing Provider  amLODipine (NORVASC) 5 MG tablet Take 1 tablet (5 mg total) by mouth daily. 06/12/17  Yes [name], FNP  thiamine 100 MG tablet Take 1 tablet (100 mg total) by mouth daily. 06/07/17  Yes Kayleen Memos, DO  lidocaine-prilocaine (EMLA) cream Apply to affected area once 06/11/17   [name], MD  ondansetron (ZOFRAN) 8 MG tablet Take 1 tablet (8 mg total) by mouth 2 (two) times daily as needed for refractory nausea / vomiting. Start on day 3 after chemotherapy. Patient not taking: Reported on 06/12/2017 06/11/17   [name], MD  pantoprazole (PROTONIX) 40 MG tablet Take 1 tablet (40 mg total) by mouth 2 (two) times daily. 06/06/17 07/06/17  Kayleen Memos, DO  prochlorperazine (COMPAZINE) 10 MG tablet Take 1 tablet (10 mg total) by mouth every 6 (six) hours as needed (Nausea or vomiting). Patient not taking: Reported on 06/12/2017 06/11/17   [name], MD     Family History  Problem Relation Age of Onset  . Hypertension Mother   . Cancer Sister        unknown type     Social History   Socioeconomic History  . Marital status: Legally Separated    Spouse name: None  . Number of children: None  . Years of education: None  . Highest education level: None  Social Needs  . Financial resource strain: None  . Food insecurity - worry: None  . Food insecurity - inability: None  . Transportation needs - medical: None  . Transportation needs - non-medical: None  Occupational History  . None  Tobacco Use  . Smoking status: Never Smoker  . Smokeless tobacco: Never Used  Substance and Sexual Activity  . Alcohol use: Yes    Alcohol/week: 3.0 oz    Types: 5 Cans of beer per week    Comment: 05/2017 i QUIT DRINKING 4 MONTHS AGO "  . Drug use: Yes    Types: Marijuana    Comment: occ  . Sexual activity: None  Other Topics Concern  . None  Social History Narrative  . None     Review of Systems currently denies fever, headache, chest pain, dyspnea, cough, back pain, nausea, vomiting or bleeding.  He does have intermittent abdominal pain  Vital Signs: BP 131/88 (BP Location: Right Arm)   Pulse 78   Temp 98.4 F (36.9 C) (Oral)   Resp 18   SpO2 100%   Physical Exam awake, alert.  Chest clear to auscultation bilaterally.  Heart with regular rate and rhythm.  Abdomen soft, positive bowel sounds, mild diffuse tenderness to palpation.  No lower extremity edema.  Imaging:   Labs:  CBC: Recent Labs    06/03/17 1357 06/04/17 0415 06/05/17 0303 06/06/17 0520 06/13/17 1146  WBC 14.7* 19.8* 20.0* 16.6* 13.3*  HGB 5.0* 8.0* 7.5* 9.1*  --   HCT 15.9* 24.0* 23.3* 28.1* 31.6*  PLT 662* 589* 570* 501* 404*    COAGS: Recent Labs    06/03/17 1524  INR 1.20  APTT 32    BMP: Recent Labs    06/04/17 0415 06/05/17 0303 06/06/17 0520 06/13/17 1146  NA 136 133* 133* 136  K 4.1 3.8 3.8 3.9  CL 106 104 104 105  CO2  21* 21* 22 26  GLUCOSE 92 92 80 86  BUN 9 11 9 13   CALCIUM 8.2* 8.2* 8.2* 8.3*  CREATININE 0.86 0.97 0.88 0.83  GFRNONAA >60 >60 >60 >60  GFRAA >60 >60 >60 >60    LIVER FUNCTION TESTS: Recent Labs    06/03/17 1524 06/06/17 0520 06/13/17 1146  BILITOT 0.3 1.6* 0.3  AST 15 14* 12  ALT 8* 9* 4  ALKPHOS 49 49 52  PROT 5.8* 5.0* 6.0*  ALBUMIN 2.9* 2.4* 2.8*    TUMOR MARKERS: No results for input(s): AFPTM, CEA, CA199, CHROMGRNA in the last 8760 hours.  Assessment and Plan: 57 y.o. male with history of newly diagnosed gastric carcinoma who presents today for Port-A-Cath placement for palliative chemotherapy.Risks and benefits discussed with the patient including, but not limited to bleeding, infection, pneumothorax, or fibrin sheath development and need for additional procedures.  All of the patient's questions were answered, patient is agreeable to proceed. Consent signed and in chart.Labs pend.     Thank you for this interesting consult.  I greatly enjoyed meeting Ryan Collins and look forward to participating in their care.  A copy of this report was sent to the requesting provider on this date.  Electronically Signed: D. Rowe Robert, PA-C 06/20/2017, 8:45 AM   I spent a total of 20 minutes  in face to face in clinical consultation, greater than 50% of which was counseling/coordinating care for Port-A-Cath placement

## 2017-06-24 ENCOUNTER — Encounter: Payer: Self-pay | Admitting: Pharmacist

## 2017-06-25 ENCOUNTER — Encounter: Payer: Self-pay | Admitting: General Practice

## 2017-06-25 NOTE — Progress Notes (Signed)
[facility] CSW Progress Note  [place] has completed disability application w patient, filing date is 06/24/17.  Application is considered a TERI case per [facility].  Edwyna Shell, LCSW Clinical Social Worker Phone:  [phone]

## 2017-06-26 ENCOUNTER — Inpatient Hospital Stay: Payer: BLUE CROSS/BLUE SHIELD | Attending: Hematology

## 2017-06-26 ENCOUNTER — Inpatient Hospital Stay: Payer: BLUE CROSS/BLUE SHIELD

## 2017-06-26 ENCOUNTER — Encounter: Payer: Self-pay | Admitting: Nurse Practitioner

## 2017-06-26 ENCOUNTER — Inpatient Hospital Stay (HOSPITAL_BASED_OUTPATIENT_CLINIC_OR_DEPARTMENT_OTHER): Payer: BLUE CROSS/BLUE SHIELD | Admitting: Nurse Practitioner

## 2017-06-26 ENCOUNTER — Ambulatory Visit: Payer: BLUE CROSS/BLUE SHIELD | Admitting: Nutrition

## 2017-06-26 ENCOUNTER — Telehealth: Payer: Self-pay

## 2017-06-26 VITALS — BP 123/81 | HR 82 | Resp 20

## 2017-06-26 VITALS — BP 102/81 | HR 78 | Temp 97.7°F | Resp 24 | Ht 71.5 in | Wt 153.2 lb

## 2017-06-26 VITALS — BP 103/66 | HR 70 | Temp 97.5°F | Resp 16

## 2017-06-26 DIAGNOSIS — Z86718 Personal history of other venous thrombosis and embolism: Secondary | ICD-10-CM | POA: Diagnosis not present

## 2017-06-26 DIAGNOSIS — C168 Malignant neoplasm of overlapping sites of stomach: Secondary | ICD-10-CM

## 2017-06-26 DIAGNOSIS — D500 Iron deficiency anemia secondary to blood loss (chronic): Secondary | ICD-10-CM

## 2017-06-26 DIAGNOSIS — C786 Secondary malignant neoplasm of retroperitoneum and peritoneum: Secondary | ICD-10-CM

## 2017-06-26 DIAGNOSIS — C161 Malignant neoplasm of fundus of stomach: Secondary | ICD-10-CM

## 2017-06-26 DIAGNOSIS — I959 Hypotension, unspecified: Secondary | ICD-10-CM | POA: Insufficient documentation

## 2017-06-26 DIAGNOSIS — D508 Other iron deficiency anemias: Secondary | ICD-10-CM | POA: Diagnosis not present

## 2017-06-26 DIAGNOSIS — E538 Deficiency of other specified B group vitamins: Secondary | ICD-10-CM | POA: Diagnosis not present

## 2017-06-26 DIAGNOSIS — Z5111 Encounter for antineoplastic chemotherapy: Secondary | ICD-10-CM | POA: Diagnosis present

## 2017-06-26 DIAGNOSIS — D6859 Other primary thrombophilia: Secondary | ICD-10-CM | POA: Diagnosis not present

## 2017-06-26 DIAGNOSIS — R634 Abnormal weight loss: Secondary | ICD-10-CM | POA: Insufficient documentation

## 2017-06-26 DIAGNOSIS — R1013 Epigastric pain: Secondary | ICD-10-CM | POA: Insufficient documentation

## 2017-06-26 DIAGNOSIS — K922 Gastrointestinal hemorrhage, unspecified: Secondary | ICD-10-CM

## 2017-06-26 LAB — CMP (CANCER CENTER ONLY)
AST: 11 U/L (ref 5–34)
Albumin: 3.4 g/dL — ABNORMAL LOW (ref 3.5–5.0)
Alkaline Phosphatase: 55 U/L (ref 40–150)
Anion gap: 9 (ref 3–11)
BUN: 14 mg/dL (ref 7–26)
CALCIUM: 9.3 mg/dL (ref 8.4–10.4)
CHLORIDE: 102 mmol/L (ref 98–109)
CO2: 24 mmol/L (ref 22–29)
CREATININE: 0.83 mg/dL (ref 0.70–1.30)
Glucose, Bld: 95 mg/dL (ref 70–140)
Potassium: 3.6 mmol/L (ref 3.5–5.1)
SODIUM: 135 mmol/L — AB (ref 136–145)
TOTAL PROTEIN: 7.1 g/dL (ref 6.4–8.3)
Total Bilirubin: 0.4 mg/dL (ref 0.2–1.2)

## 2017-06-26 LAB — CBC WITH DIFFERENTIAL (CANCER CENTER ONLY)
BASOS ABS: 0 10*3/uL (ref 0.0–0.1)
BASOS PCT: 0 %
Eosinophils Absolute: 0 10*3/uL (ref 0.0–0.5)
Eosinophils Relative: 0 %
HCT: 34.2 % — ABNORMAL LOW (ref 38.4–49.9)
HEMOGLOBIN: 11.1 g/dL — AB (ref 13.0–17.1)
LYMPHS ABS: 2.1 10*3/uL (ref 0.9–3.3)
Lymphocytes Relative: 19 %
MCH: 25.1 pg — ABNORMAL LOW (ref 27.2–33.4)
MCHC: 32.5 g/dL (ref 32.0–36.0)
MCV: 77.2 fL — ABNORMAL LOW (ref 79.3–98.0)
Monocytes Absolute: 0.9 10*3/uL (ref 0.1–0.9)
Monocytes Relative: 8 %
NEUTROS PCT: 73 %
Neutro Abs: 8.2 10*3/uL — ABNORMAL HIGH (ref 1.5–6.5)
Platelet Count: 545 10*3/uL — ABNORMAL HIGH (ref 140–400)
RBC: 4.43 MIL/uL (ref 4.20–5.82)
RDW: 24.7 % — ABNORMAL HIGH (ref 11.0–14.6)
WBC: 11.3 10*3/uL — AB (ref 4.0–10.3)

## 2017-06-26 MED ORDER — LEUCOVORIN CALCIUM INJECTION 350 MG
400.0000 mg/m2 | Freq: Once | INTRAVENOUS | Status: AC
  Administered 2017-06-26: 780 mg via INTRAVENOUS
  Filled 2017-06-26: qty 39

## 2017-06-26 MED ORDER — OXALIPLATIN CHEMO INJECTION 100 MG/20ML
85.0000 mg/m2 | Freq: Once | INTRAVENOUS | Status: AC
  Administered 2017-06-26: 165 mg via INTRAVENOUS
  Filled 2017-06-26: qty 33

## 2017-06-26 MED ORDER — DEXAMETHASONE SODIUM PHOSPHATE 10 MG/ML IJ SOLN
INTRAMUSCULAR | Status: AC
  Filled 2017-06-26: qty 1

## 2017-06-26 MED ORDER — DEXAMETHASONE SODIUM PHOSPHATE 10 MG/ML IJ SOLN
10.0000 mg | Freq: Once | INTRAMUSCULAR | Status: AC
  Administered 2017-06-26: 10 mg via INTRAVENOUS

## 2017-06-26 MED ORDER — PALONOSETRON HCL INJECTION 0.25 MG/5ML
INTRAVENOUS | Status: AC
  Filled 2017-06-26: qty 5

## 2017-06-26 MED ORDER — FLUOROURACIL CHEMO INJECTION 2.5 GM/50ML
400.0000 mg/m2 | Freq: Once | INTRAVENOUS | Status: DC
  Filled 2017-06-26: qty 16

## 2017-06-26 MED ORDER — SODIUM CHLORIDE 0.9% FLUSH
10.0000 mL | INTRAVENOUS | Status: DC | PRN
  Administered 2017-06-26: 10 mL
  Filled 2017-06-26: qty 10

## 2017-06-26 MED ORDER — DEXTROSE 5 % IV SOLN
Freq: Once | INTRAVENOUS | Status: AC
  Administered 2017-06-26: 11:00:00 via INTRAVENOUS

## 2017-06-26 MED ORDER — PALONOSETRON HCL INJECTION 0.25 MG/5ML
0.2500 mg | Freq: Once | INTRAVENOUS | Status: AC
  Administered 2017-06-26: 0.25 mg via INTRAVENOUS

## 2017-06-26 MED ORDER — TRAMADOL HCL 50 MG PO TABS: 50.0000 mg | ORAL_TABLET | Freq: Four times a day (QID) | ORAL | 0 refills | Status: DC | PRN

## 2017-06-26 MED ORDER — SODIUM CHLORIDE 0.9 % IV SOLN
2400.0000 mg/m2 | INTRAVENOUS | Status: DC
  Administered 2017-06-26: 4700 mg via INTRAVENOUS
  Filled 2017-06-26: qty 94

## 2017-06-26 NOTE — Telephone Encounter (Signed)
Infusion will print avs and calender for upcoming appointment. Per 2/13 los

## 2017-06-26 NOTE — Progress Notes (Signed)
  Oncology Nurse Navigator Documentation    Met with patient during his first Chemo tx to offer encouragement and support.  No barriers, questions or concerns identified. Patient encouraged to call with questions or concerns.

## 2017-06-26 NOTE — Progress Notes (Signed)
Discharge instructions reviewed and verified with the pt. Pt verbalized understanding.

## 2017-06-26 NOTE — Patient Instructions (Addendum)
[facility] Discharge Instructions for Patients Receiving Chemotherapy  Today you received the following chemotherapy agents  Oxaliplatin,leucovorin and adrucil  To help prevent nausea and vomiting after your treatment, we encourage you to take your nausea medication as directed   If you develop nausea and vomiting that is not controlled by your nausea medication, call the clinic.   BELOW ARE SYMPTOMS THAT SHOULD BE REPORTED IMMEDIATELY:  *FEVER GREATER THAN 100.5 F  *CHILLS WITH OR WITHOUT FEVER  NAUSEA AND VOMITING THAT IS NOT CONTROLLED WITH YOUR NAUSEA MEDICATION  *UNUSUAL SHORTNESS OF BREATH  *UNUSUAL BRUISING OR BLEEDING  TENDERNESS IN MOUTH AND THROAT WITH OR WITHOUT PRESENCE OF ULCERS  *URINARY PROBLEMS  *BOWEL PROBLEMS  UNUSUAL RASH Items with * indicate a potential emergency and should be followed up as soon as possible.  Feel free to call the clinic should you have any questions or concerns. The clinic phone number is [phone].  Please show the Ward at check-in to the Emergency Department and triage nurse.    Oxaliplatin Injection What is this medicine? OXALIPLATIN (ox AL i PLA tin) is a chemotherapy drug. It targets fast dividing cells, like cancer cells, and causes these cells to die. This medicine is used to treat cancers of the colon and rectum, and many other cancers. This medicine may be used for other purposes; ask your health care provider or pharmacist if you have questions. COMMON BRAND NAME(S): Eloxatin What should I tell my health care provider before I take this medicine? They need to know if you have any of these conditions: -kidney disease -an unusual or allergic reaction to oxaliplatin, other chemotherapy, other medicines, foods, dyes, or preservatives -pregnant or trying to get pregnant -breast-feeding How should I use this medicine? This drug is given as an infusion into a vein. It is administered in a  hospital or clinic by a specially trained health care professional. Talk to your pediatrician regarding the use of this medicine in children. Special care may be needed. Overdosage: If you think you have taken too much of this medicine contact a poison control center or emergency room at once. NOTE: This medicine is only for you. Do not share this medicine with others. What if I miss a dose? It is important not to miss a dose. Call your doctor or health care professional if you are unable to keep an appointment. What may interact with this medicine? -medicines to increase blood counts like filgrastim, pegfilgrastim, sargramostim -probenecid -some antibiotics like amikacin, gentamicin, neomycin, polymyxin B, streptomycin, tobramycin -zalcitabine Talk to your doctor or health care professional before taking any of these medicines: -acetaminophen -aspirin -ibuprofen -ketoprofen -naproxen This list may not describe all possible interactions. Give your health care provider a list of all the medicines, herbs, non-prescription drugs, or dietary supplements you use. Also tell them if you smoke, drink alcohol, or use illegal drugs. Some items may interact with your medicine. What should I watch for while using this medicine? Your condition will be monitored carefully while you are receiving this medicine. You will need important blood work done while you are taking this medicine. This medicine can make you more sensitive to cold. Do not drink cold drinks or use ice. Cover exposed skin before coming in contact with cold temperatures or cold objects. When out in cold weather wear warm clothing and cover your mouth and nose to warm the air that goes into your lungs. Tell your doctor if you get sensitive to  the cold. This drug may make you feel generally unwell. This is not uncommon, as chemotherapy can affect healthy cells as well as cancer cells. Report any side effects. Continue your course of treatment  even though you feel ill unless your doctor tells you to stop. In some cases, you may be given additional medicines to help with side effects. Follow all directions for their use. Call your doctor or health care professional for advice if you get a fever, chills or sore throat, or other symptoms of a cold or flu. Do not treat yourself. This drug decreases your body's ability to fight infections. Try to avoid being around people who are sick. This medicine may increase your risk to bruise or bleed. Call your doctor or health care professional if you notice any unusual bleeding. Be careful brushing and flossing your teeth or using a toothpick because you may get an infection or bleed more easily. If you have any dental work done, tell your dentist you are receiving this medicine. Avoid taking products that contain aspirin, acetaminophen, ibuprofen, naproxen, or ketoprofen unless instructed by your doctor. These medicines may hide a fever. Do not become pregnant while taking this medicine. Women should inform their doctor if they wish to become pregnant or think they might be pregnant. There is a potential for serious side effects to an unborn child. Talk to your health care professional or pharmacist for more information. Do not breast-feed an infant while taking this medicine. Call your doctor or health care professional if you get diarrhea. Do not treat yourself. What side effects may I notice from receiving this medicine? Side effects that you should report to your doctor or health care professional as soon as possible: -allergic reactions like skin rash, itching or hives, swelling of the face, lips, or tongue -low blood counts - This drug may decrease the number of white blood cells, red blood cells and platelets. You may be at increased risk for infections and bleeding. -signs of infection - fever or chills, cough, sore throat, pain or difficulty passing urine -signs of decreased platelets or  bleeding - bruising, pinpoint red spots on the skin, black, tarry stools, nosebleeds -signs of decreased red blood cells - unusually weak or tired, fainting spells, lightheadedness -breathing problems -chest pain, pressure -cough -diarrhea -jaw tightness -mouth sores -nausea and vomiting -pain, swelling, redness or irritation at the injection site -pain, tingling, numbness in the hands or feet -problems with balance, talking, walking -redness, blistering, peeling or loosening of the skin, including inside the mouth -trouble passing urine or change in the amount of urine Side effects that usually do not require medical attention (report to your doctor or health care professional if they continue or are bothersome): -changes in vision -constipation -hair loss -loss of appetite -metallic taste in the mouth or changes in taste -stomach pain This list may not describe all possible side effects. Call your doctor for medical advice about side effects. You may report side effects to FDA at 1-800-FDA-1088. Where should I keep my medicine? This drug is given in a hospital or clinic and will not be stored at home. NOTE: This sheet is a summary. It may not cover all possible information. If you have questions about this medicine, talk to your doctor, pharmacist, or health care provider.  2018 Elsevier/Gold Standard (2007-11-25 17:22:47)   Leucovorin injection What is this medicine? LEUCOVORIN (loo koe VOR in) is used to prevent or treat the harmful effects of some medicines. This medicine is  used to treat anemia caused by a low amount of folic acid in the body. It is also used with 5-fluorouracil (5-FU) to treat colon cancer. This medicine may be used for other purposes; ask your health care provider or pharmacist if you have questions. What should I tell my health care provider before I take this medicine? They need to know if you have any of these conditions: -anemia from low levels of  vitamin B-12 in the blood -an unusual or allergic reaction to leucovorin, folic acid, other medicines, foods, dyes, or preservatives -pregnant or trying to get pregnant -breast-feeding How should I use this medicine? This medicine is for injection into a muscle or into a vein. It is given by a health care professional in a hospital or clinic setting. Talk to your pediatrician regarding the use of this medicine in children. Special care may be needed. Overdosage: If you think you have taken too much of this medicine contact a poison control center or emergency room at once. NOTE: This medicine is only for you. Do not share this medicine with others. What if I miss a dose? This does not apply. What may interact with this medicine? -capecitabine -fluorouracil -phenobarbital -phenytoin -primidone -trimethoprim-sulfamethoxazole This list may not describe all possible interactions. Give your health care provider a list of all the medicines, herbs, non-prescription drugs, or dietary supplements you use. Also tell them if you smoke, drink alcohol, or use illegal drugs. Some items may interact with your medicine. What should I watch for while using this medicine? Your condition will be monitored carefully while you are receiving this medicine. This medicine may increase the side effects of 5-fluorouracil, 5-FU. Tell your doctor or health care professional if you have diarrhea or mouth sores that do not get better or that get worse. What side effects may I notice from receiving this medicine? Side effects that you should report to your doctor or health care professional as soon as possible: -allergic reactions like skin rash, itching or hives, swelling of the face, lips, or tongue -breathing problems -fever, infection -mouth sores -unusual bleeding or bruising -unusually weak or tired Side effects that usually do not require medical attention (report to your doctor or health care professional if  they continue or are bothersome): -constipation or diarrhea -loss of appetite -nausea, vomiting This list may not describe all possible side effects. Call your doctor for medical advice about side effects. You may report side effects to FDA at 1-800-FDA-1088. Where should I keep my medicine? This drug is given in a hospital or clinic and will not be stored at home. NOTE: This sheet is a summary. It may not cover all possible information. If you have questions about this medicine, talk to your doctor, pharmacist, or health care provider.  2018 Elsevier/Gold Standard (2007-11-04 16:50:29)   Fluorouracil, 5-FU injection What is this medicine? FLUOROURACIL, 5-FU (flure oh YOOR a sil) is a chemotherapy drug. It slows the growth of cancer cells. This medicine is used to treat many types of cancer like breast cancer, colon or rectal cancer, pancreatic cancer, and stomach cancer. This medicine may be used for other purposes; ask your health care provider or pharmacist if you have questions. COMMON BRAND NAME(S): Adrucil What should I tell my health care provider before I take this medicine? They need to know if you have any of these conditions: -blood disorders -dihydropyrimidine dehydrogenase (DPD) deficiency -infection (especially a virus infection such as chickenpox, cold sores, or herpes) -kidney disease -liver disease -malnourished,  recent or ongoing radiation therapy -an unusual or allergic reaction to fluorouracil, other chemotherapy, other medicines, foods, dyes, or preservatives -pregnant or trying to get pregnant -breast-feeding How should I use this medicine? This drug is given as an infusion or injection into a vein. It is administered in a hospital or clinic by a specially trained health care professional. Talk to your pediatrician regarding the use of this medicine in children. Special care may be needed. Overdosage: If you think you have taken too much of this medicine  contact a poison control center or emergency room at once. NOTE: This medicine is only for you. Do not share this medicine with others. What if I miss a dose? It is important not to miss your dose. Call your doctor or health care professional if you are unable to keep an appointment. What may interact with this medicine? -allopurinol -cimetidine -dapsone -digoxin -hydroxyurea -leucovorin -levamisole -medicines for seizures like ethotoin, fosphenytoin, phenytoin -medicines to increase blood counts like filgrastim, pegfilgrastim, sargramostim -medicines that treat or prevent blood clots like warfarin, enoxaparin, and dalteparin -methotrexate -metronidazole -pyrimethamine -some other chemotherapy drugs like busulfan, cisplatin, estramustine, vinblastine -trimethoprim -trimetrexate -vaccines Talk to your doctor or health care professional before taking any of these medicines: -acetaminophen -aspirin -ibuprofen -ketoprofen -naproxen This list may not describe all possible interactions. Give your health care provider a list of all the medicines, herbs, non-prescription drugs, or dietary supplements you use. Also tell them if you smoke, drink alcohol, or use illegal drugs. Some items may interact with your medicine. What should I watch for while using this medicine? Visit your doctor for checks on your progress. This drug may make you feel generally unwell. This is not uncommon, as chemotherapy can affect healthy cells as well as cancer cells. Report any side effects. Continue your course of treatment even though you feel ill unless your doctor tells you to stop. In some cases, you may be given additional medicines to help with side effects. Follow all directions for their use. Call your doctor or health care professional for advice if you get a fever, chills or sore throat, or other symptoms of a cold or flu. Do not treat yourself. This drug decreases your body's ability to fight  infections. Try to avoid being around people who are sick. This medicine may increase your risk to bruise or bleed. Call your doctor or health care professional if you notice any unusual bleeding. Be careful brushing and flossing your teeth or using a toothpick because you may get an infection or bleed more easily. If you have any dental work done, tell your dentist you are receiving this medicine. Avoid taking products that contain aspirin, acetaminophen, ibuprofen, naproxen, or ketoprofen unless instructed by your doctor. These medicines may hide a fever. Do not become pregnant while taking this medicine. Women should inform their doctor if they wish to become pregnant or think they might be pregnant. There is a potential for serious side effects to an unborn child. Talk to your health care professional or pharmacist for more information. Do not breast-feed an infant while taking this medicine. Men should inform their doctor if they wish to father a child. This medicine may lower sperm counts. Do not treat diarrhea with over the counter products. Contact your doctor if you have diarrhea that lasts more than 2 days or if it is severe and watery. This medicine can make you more sensitive to the sun. Keep out of the sun. If you cannot avoid being in   the sun, wear protective clothing and use sunscreen. Do not use sun lamps or tanning beds/booths. What side effects may I notice from receiving this medicine? Side effects that you should report to your doctor or health care professional as soon as possible: -allergic reactions like skin rash, itching or hives, swelling of the face, lips, or tongue -low blood counts - this medicine may decrease the number of white blood cells, red blood cells and platelets. You may be at increased risk for infections and bleeding. -signs of infection - fever or chills, cough, sore throat, pain or difficulty passing urine -signs of decreased platelets or bleeding - bruising,  pinpoint red spots on the skin, black, tarry stools, blood in the urine -signs of decreased red blood cells - unusually weak or tired, fainting spells, lightheadedness -breathing problems -changes in vision -chest pain -mouth sores -nausea and vomiting -pain, swelling, redness at site where injected -pain, tingling, numbness in the hands or feet -redness, swelling, or sores on hands or feet -stomach pain -unusual bleeding Side effects that usually do not require medical attention (report to your doctor or health care professional if they continue or are bothersome): -changes in finger or toe nails -diarrhea -dry or itchy skin -hair loss -headache -loss of appetite -sensitivity of eyes to the light -stomach upset -unusually teary eyes This list may not describe all possible side effects. Call your doctor for medical advice about side effects. You may report side effects to FDA at 1-800-FDA-1088. Where should I keep my medicine? This drug is given in a hospital or clinic and will not be stored at home. NOTE: This sheet is a summary. It may not cover all possible information. If you have questions about this medicine, talk to your doctor, pharmacist, or health care provider.  2018 Elsevier/Gold Standard (2007-09-03 13:53:16)  

## 2017-06-26 NOTE — Progress Notes (Signed)
Nutrition follow-up completed with patient receiving chemotherapy for gastric cancer. Weight decreased and documented as 153.2 pounds February 13 decreased from 170 pounds January 22. This is a 10% weight loss over 3 weeks which is significant. Noted labs on January 31 of albumin of 2.8. Patient reports early satiety continues. He is tolerating Ensure Plus and drinks 1-2 bottles daily. Dietary recall reveals patient oral intake is inadequate.  Patient consumed approximately 800 cal yesterday.  Reports this is a typical day. Suspect severe malnutrition in the context of acute illness secondary to greater than 5% weight loss over 1 month and less than 50% energy intake for greater than 5 days.  Unable to complete nutrition focused physical exam.  Estimated nutrition needs: 2000-2200 cal, 90-110 g protein, 2.2 L fluid.  Nutrition diagnosis: Unintended weight loss continues  Intervention: Educated patient to increase Ensure Plus to a minimum of 3 bottles daily between meals. Reviewed high-calorie high-protein foods to choose at mealtimes. Questions were answered.  Teach back method used.  Monitoring evaluation goals: Patient will work to increase calories and protein to minimize further weight loss  Next visit: Wednesday, February 27 during infusion.  **Disclaimer: This note was dictated with voice recognition software. Similar sounding words can inadvertently be transcribed and this note may contain transcription errors which may not have been corrected upon publication of note.**

## 2017-06-26 NOTE — Progress Notes (Signed)
[facility]  Telephone:([phone]) [phone] [phone]  Clinic Follow up Note   Patient Care Team: [name], FNP as PCP - General (Family Medicine) 06/26/2017  CHIEF COMPLAINT: Follow up gastric cancer   SUMMARY OF ONCOLOGIC HISTORY: Oncology History   Cancer Staging Gastric cancer [facility]) Staging form: Stomach, AJCC 8th Edition - Clinical stage from 06/04/2017: Stage IVB (cT4, cN0, cM1) - Signed by [name], MD on 06/10/2017       Gastric cancer ([facility])   06/03/2017 - 06/06/2017 Hospital Admission    Admitted to the hospital on 06/03/17 with complaints of fatigue and melena. During his stay he underwent an endoscopy that revealed a malignant gastric tumor in the cardia and in the gastric fundus. Biopsy results revealed adenocarcinoma. Pt was discharged on 06/06/17.      06/04/2017 Initial Biopsy    Diagnosis 06/04/17 Stomach, biopsy, Fundus - ADENOCARCINOMA - SEE COMMENT Microscopic Comment The stomach biopsy is of an adenocarcinoma arising in a background of intestinal metaplasia. A Warthin-Starry stain is performed to determine the possibility of the presence of Helicobacter pylori. The Warthin-Starry stain is negative for organisms morphologically consistent with Helicobacter pylori.      06/04/2017 Procedure    Esophagogastroduodenoscopy 06/04/17 by Dr. Benson Norway Impression: Normal esophagus. Malignant gastric tumor in the cardia and in the  gastric fundus. Biopsied. Normal examined duodenum.      06/05/2017 Imaging    CT CAP W Contrast 06/05/17 IMPRESSION: 9.1 cm fungating mass in the gastric cardia/posterior gastric fundus, corresponding to the patient's newly diagnosed gastric Cancer. Associated extra gastric extension with peritoneal disease in the left upper abdomen, including a dominant 4.2 cm peritoneal implant. Small volume pelvic ascites. 1.8 cm hypoenhancing lesion along the inferior spleen, indeterminate.      06/10/2017 Initial Diagnosis   Gastric cancer ([place])     CURRENT THERAPY: first line FOLFOX to begin 06/26/17  INTERVAL HISTORY: Ryan Collins returns for follow up today as scheduled prior to beginning FOLFOX chemotherapy. He has attended chemo class and had PAC placed. Today in lab he became dizzy and diaphoretic after the needle was inserted, reports he did not lose consciousness. Symptoms resolved and he ambulated independently to exam room without dizziness. He took his BP med this morning and had small meal prior to arrival. Usually eats twice per day with bloating after meals. Intermittent epigastric pain ranges 7-8/10. Takes 2 extra strength tylenol every 4-6 hours per day with moderate relief. Sleep is often interrupted with pain. Occasionally feels constipated, last BM 1 day ago; denies nausea, vomiting, or diarrhea.   REVIEW OF SYSTEMS:   Constitutional: Denies fevers, chills (+) abnormal weight loss (+) eats twice per day Eyes: Denies blurriness of vision Ears, nose, mouth, throat, and face: Denies mucositis or sore throat Respiratory: Denies cough, dyspnea or wheezes Cardiovascular: Denies palpitation, chest discomfort or lower extremity swelling Gastrointestinal:  Denies nausea, vomiting, constipation, diarrhea, (+)heartburn (+) bloating after meal (+) intermittent epigastric pain ranging 7-8/10, tylenol with moderate relief (+) occasional constipation, last BM 1 day ago Skin: Denies abnormal skin rashes Lymphatics: Denies new lymphadenopathy or easy bruising Neurological:Denies numbness, tingling or new weaknesses Behavioral/Psych: Mood is stable, no new changes  All other systems were reviewed with the patient and are negative.  MEDICAL HISTORY:  Past Medical History:  Diagnosis Date  . DVT (deep venous thrombosis) ([place]) 2011  . GI bleeding 2001  . History of DVT of lower extremity   . Melena 05/2017  . Stomach ulcer  Clinically suspected; No EGD confirmation as of 06/03/17    SURGICAL HISTORY: Past  Surgical History:  Procedure Laterality Date  . ESOPHAGOGASTRODUODENOSCOPY  06/04/2017  . ESOPHAGOGASTRODUODENOSCOPY N/A 06/04/2017   Procedure: ESOPHAGOGASTRODUODENOSCOPY (EGD);  Surgeon: Carol Ada, MD;  Location: [facility];  Service: Endoscopy;  Laterality: N/A;  . IR FLUORO GUIDE PORT INSERTION RIGHT  06/20/2017  . IR US GUIDE VASC ACCESS RIGHT  06/20/2017    I have reviewed the social history and family history with the patient and they are unchanged from previous note.  ALLERGIES:  has No Known Allergies.  MEDICATIONS:  Current Outpatient Medications  Medication Sig Dispense Refill  . amLODipine (NORVASC) 5 MG tablet Take 1 tablet (5 mg total) by mouth daily. 30 tablet 0  . lidocaine-prilocaine (EMLA) cream Apply to affected area once 30 g 3  . ondansetron (ZOFRAN) 8 MG tablet Take 1 tablet (8 mg total) by mouth 2 (two) times daily as needed for refractory nausea / vomiting. Start on day 3 after chemotherapy. 30 tablet 1  . pantoprazole (PROTONIX) 40 MG tablet Take 1 tablet (40 mg total) by mouth 2 (two) times daily. 60 tablet 0  . prochlorperazine (COMPAZINE) 10 MG tablet Take 1 tablet (10 mg total) by mouth every 6 (six) hours as needed (Nausea or vomiting). 30 tablet 1  . thiamine 100 MG tablet Take 1 tablet (100 mg total) by mouth daily. 30 tablet 0  . traMADol (ULTRAM) 50 MG tablet Take 1 tablet (50 mg total) by mouth every 6 (six) hours as needed. 30 tablet 0   No current facility-administered medications for this visit.    Facility-Administered Medications Ordered in Other Visits  Medication Dose Route Frequency Provider Last Rate Last Dose  . fluorouracil (ADRUCIL) 4,700 mg in sodium chloride 0.9 % 56 mL chemo infusion  2,400 mg/m2 (Treatment Plan Recorded) Intravenous 1 day or 1 dose [name], MD   4,700 mg at 06/26/17 1450    PHYSICAL EXAMINATION: ECOG PERFORMANCE STATUS: 1 - Symptomatic but completely ambulatory  Vitals:   06/26/17 1001  BP: 102/81  Pulse: 78    Resp: (!) 24  Temp: 97.7 F (36.5 C)  SpO2: 100%   Filed Weights   06/26/17 1001  Weight: 153 lb 3.2 oz (69.5 kg)   Orthostatic BP readings Sitting 127/83 Standing 123/81   GENERAL:alert, no distress and comfortable SKIN: skin color, texture, turgor are normal, no rashes or significant lesions EYES: normal, Conjunctiva are pink and non-injected, sclera clear OROPHARYNX:no exudate, no erythema and lips, buccal mucosa, and tongue normal  NECK: supple, thyroid normal size, non-tender, without nodularity LYMPH:  no palpable cervical, supraclavicular, axillary, or inguinal lymphadenopathy  LUNGS: clear to auscultation bilaterally with normal breathing effort HEART: regular rate & rhythm and no murmurs and no lower extremity edema ABDOMEN:abdomen soft, non-tender and normal bowel sounds Musculoskeletal:no cyanosis of digits and no clubbing  NEURO: alert & oriented x 3 with fluent speech, no focal motor/sensory deficits PAC covered with dressing, surrounding area without erythema  LABORATORY DATA:  I have reviewed the data as listed CBC Latest Ref Rng & Units 06/26/2017 06/20/2017 06/13/2017  WBC 4.0 - 10.3 K/uL 11.3(H) 10.6(H) 13.3(H)  Hemoglobin 13.0 - 17.0 g/dL - 11.0(L) -  Hematocrit 38.4 - 49.9 % 34.2(L) 34.7(L) 31.6(L)  Platelets 140 - 400 K/uL 545(H) 651(H) 404(H)     CMP Latest Ref Rng & Units 06/26/2017 06/13/2017 06/06/2017  Glucose 70 - 140 mg/dL 95 86 80  BUN 7 - 26  mg/dL _0 Creatinine 0.70 - 1.30 mg/dL 0.83 0.83 0.88  Sodium 136 - 145 mmol/L 135(L) 136 133(L)  Potassium 3.5 - 5.1 mmol/L 3.6 3.9 3.8  Chloride 98 - 109 mmol/L 102 105 104  CO2 22 - 29 mmol/L _1 Calcium 8.4 - 10.4 mg/dL 9.3 8.3(L) 8.2(L)  Total Protein 6.4 - 8.3 g/dL 7.1 6.0(L) 5.0(L)  Total Bilirubin 0.2 - 1.2 mg/dL 0.4 0.3 1.6(H)  Alkaline Phos 40 - 150 U/L 55 52 49  AST 5 - 34 U/L 11 12 14(L)  ALT 0 - 55 U/L <6 4 9(L)    PATHOLOGY  Diagnosis 06/04/17 Stomach, biopsy, Fundus -  ADENOCARCINOMA - SEE COMMENT Microscopic Comment The stomach biopsy is of an adenocarcinoma arising in a background of intestinal metaplasia. A Warthin-Starry stain is performed to determine the possibility of the presence of Helicobacter pylori. The Warthin-Starry stain is negative for organisms morphologically consistent with Helicobacter pylori  ADDITIONAL INFORMATION: FLUORESCENCE IN-SITU HYBRIDIZATION Results: HER2 - NEGATIVE RATIO OF HER2/CEP17 SIGNALS 1.33 AVERAGE HER2 COPY NUMBER PER CELL 2.71  RADIOGRAPHIC STUDIES: I have personally reviewed the radiological images as listed and agreed with the findings in the report. No results found.   ASSESSMENT & PLAN: Ryan Collins 57 y.o. male, without significant past medical history, but also does not  see doctors routinely, presented with worsening fatigue, intermittent epigastric pain and melena for 3-4 months, and 50 pounds weight loss over the past 6 months.  1. Gastric cancer, [id number] with peritoneal metastasis, adenocarcinoma, MSI-pending  -Ryan Collins appears stable today. He has had significant weight loss in last 2 weeks and hypotensive event in the lab today. I encouraged him to drink more water and eat small frequent amounts throughout the day. I recommend he drink 2 nutritional supplement per day to avoid further weight loss. Dr. Burr Medico plans to hold 5FU bolus today.  -He continues to have epigastric pain and is taking a lot of tylenol, 4-6 g per day. I prescribed tramadol 1 tab q6 PRN for more effective pain relief. I reviewed possible side effects and management of constipation -He attended chemo class and had PAC placed, he has anti-emetics at home. I again briefly reviewed potential chemo side effects and symptom management.  -labs reviewed, adequate to proceed with cycle 1 FOLFOX without 5FU bolus today. If he tolerates, may add back with subsequent cycles.  -MSI and PD-L1 still pending. His tumor is HER2 negative; he is not a  candidate for anti-HER2 therapy. -Will return in 1 week for lab and toxicity check with possible IVF.   2. Anemia of iron deficiency and tumor bleeding, B12 deficiency  -S/p IV Feraheme on 06/03/17 and 1/30; will check iron studies periodically  3. History of DVT, protein C deficiency -not on anticoagulation due to tumor bleeding, will monitor clinically  4. History of alcohol and marijuana abuse 5. Goal of care discussion  6. Hypotension, diaphoresis, dizziness -He became dizzy and diaphoretic after PAC access, symptoms resolved and he ambulated to exam room independently; BP 102/81 in the exam room. He ate small meal before arrival and took amlodipine today. Orthostatic BP checked in infusion room, sitting 127/83, standing 123/81, no significant change. I suspect he had vasovagal response. He has recovered. Will monitor closely.  7. Weight loss He has lost approximately 13 lbs in 2 weeks; eats twice per day. He occasionally feels bloated after eating and has intermittent epigastric pain. I encouraged him to eat small frequent amounts, increase  water intake, and drink 2 nutritional supplements per day. He agrees. He will see dietician today while in infusion room.  PLAN: -Labs reviewed, proceed with cycle 1 FOLFOX, omit 5FU bolus today -F/u iron studies with next lab draw, ordered today -Increase po intake, add 2 nutritional supplement drinks per day; dietician to see him today in infusion  -Tramadol for pain PRN, prescribed today -F/u in 1 week for symptom check and 2 weeks with cycle 2   Orders Placed This Encounter  Procedures  . Ferritin    Standing Status:   Standing    Number of Occurrences:   50    Standing Expiration Date:   06/27/2023  . Iron and TIBC    Standing Status:   Standing    Number of Occurrences:   50    Standing Expiration Date:   06/27/2023   All questions were answered. The patient knows to call the clinic with any problems, questions or concerns. No barriers  to learning was detected. I spent 20 minutes counseling the patient face to face. The total time spent in the appointment was 25 minutes and more than 50% was on counseling and review of test results     Alla Feeling, NP 06/26/17

## 2017-06-27 ENCOUNTER — Telehealth: Payer: Self-pay | Admitting: *Deleted

## 2017-06-27 NOTE — Telephone Encounter (Signed)
-----   Message from [place], RN sent at 06/26/2017  3:37 PM EST ----- Regarding: Dr. Burr Medico chemo follow up Dr. Burr Medico pt first time oxaliplatin, leucovorin and adrucil. Pt tolerated the infusion well.

## 2017-06-27 NOTE — Telephone Encounter (Signed)
TCT patient to follow up after 1st oxaliplatin/ Leucovorin/5FU.  Spoke with patient. He states he feels fair-still c/o some abdominal pain. He has started taking Tramadol that was prescribed yesterday with minimal relief. He is drinking ensure and eating small amounts of food, though states his stomach feels full easily.  Reinforced small frequent meals.  Reviewed using stool softner while taking tramadol d/t constipation  Side effects.  He verbalized understanding.  He is not having any problems with 5FU pump and is aware of appt tomorrow to come in for pump d/c and port needle de-access.

## 2017-06-27 NOTE — Telephone Encounter (Signed)
TCT patient to follow

## 2017-06-28 ENCOUNTER — Inpatient Hospital Stay (HOSPITAL_BASED_OUTPATIENT_CLINIC_OR_DEPARTMENT_OTHER): Payer: BLUE CROSS/BLUE SHIELD

## 2017-06-28 ENCOUNTER — Telehealth: Payer: Self-pay

## 2017-06-28 ENCOUNTER — Telehealth: Payer: Self-pay | Admitting: Hematology

## 2017-06-28 VITALS — BP 134/86 | HR 96 | Temp 98.5°F | Resp 18

## 2017-06-28 DIAGNOSIS — C161 Malignant neoplasm of fundus of stomach: Secondary | ICD-10-CM

## 2017-06-28 DIAGNOSIS — C168 Malignant neoplasm of overlapping sites of stomach: Secondary | ICD-10-CM | POA: Diagnosis not present

## 2017-06-28 MED ORDER — HEPARIN SOD (PORK) LOCK FLUSH 100 UNIT/ML IV SOLN
500.0000 [IU] | Freq: Once | INTRAVENOUS | Status: AC | PRN
  Administered 2017-06-28: 500 [IU]
  Filled 2017-06-28: qty 5

## 2017-06-28 MED ORDER — SODIUM CHLORIDE 0.9% FLUSH
10.0000 mL | INTRAVENOUS | Status: DC | PRN
  Administered 2017-06-28: 10 mL
  Filled 2017-06-28: qty 10

## 2017-06-28 NOTE — Telephone Encounter (Signed)
Scheduled appt per 2/14 sch msg - spoke with patient and confirmed appt.

## 2017-06-28 NOTE — Telephone Encounter (Signed)
Printed avs and calender of upcoming appointment. Per 2/15 los 

## 2017-07-04 ENCOUNTER — Inpatient Hospital Stay (HOSPITAL_BASED_OUTPATIENT_CLINIC_OR_DEPARTMENT_OTHER): Payer: BLUE CROSS/BLUE SHIELD | Admitting: Hematology

## 2017-07-04 ENCOUNTER — Encounter: Payer: Self-pay | Admitting: Hematology

## 2017-07-04 ENCOUNTER — Inpatient Hospital Stay: Payer: BLUE CROSS/BLUE SHIELD

## 2017-07-04 VITALS — BP 147/97 | HR 78 | Temp 99.0°F | Resp 18 | Ht 71.5 in | Wt 152.8 lb

## 2017-07-04 DIAGNOSIS — C161 Malignant neoplasm of fundus of stomach: Secondary | ICD-10-CM

## 2017-07-04 DIAGNOSIS — C786 Secondary malignant neoplasm of retroperitoneum and peritoneum: Secondary | ICD-10-CM

## 2017-07-04 DIAGNOSIS — D508 Other iron deficiency anemias: Secondary | ICD-10-CM

## 2017-07-04 DIAGNOSIS — C168 Malignant neoplasm of overlapping sites of stomach: Secondary | ICD-10-CM

## 2017-07-04 DIAGNOSIS — Z86718 Personal history of other venous thrombosis and embolism: Secondary | ICD-10-CM | POA: Diagnosis not present

## 2017-07-04 DIAGNOSIS — E538 Deficiency of other specified B group vitamins: Secondary | ICD-10-CM

## 2017-07-04 DIAGNOSIS — D6859 Other primary thrombophilia: Secondary | ICD-10-CM

## 2017-07-04 DIAGNOSIS — K922 Gastrointestinal hemorrhage, unspecified: Secondary | ICD-10-CM

## 2017-07-04 DIAGNOSIS — I10 Essential (primary) hypertension: Secondary | ICD-10-CM

## 2017-07-04 DIAGNOSIS — D500 Iron deficiency anemia secondary to blood loss (chronic): Secondary | ICD-10-CM

## 2017-07-04 DIAGNOSIS — R634 Abnormal weight loss: Secondary | ICD-10-CM | POA: Diagnosis not present

## 2017-07-04 LAB — CBC WITH DIFFERENTIAL (CANCER CENTER ONLY)
BASOS PCT: 0 %
Basophils Absolute: 0 10*3/uL (ref 0.0–0.1)
EOS ABS: 0 10*3/uL (ref 0.0–0.5)
Eosinophils Relative: 0 %
HCT: 34.4 % — ABNORMAL LOW (ref 38.4–49.9)
HEMOGLOBIN: 11.1 g/dL — AB (ref 13.0–17.1)
LYMPHS ABS: 2.3 10*3/uL (ref 0.9–3.3)
Lymphocytes Relative: 23 %
MCH: 25.1 pg — AB (ref 27.2–33.4)
MCHC: 32.2 g/dL (ref 32.0–36.0)
MCV: 77.9 fL — ABNORMAL LOW (ref 79.3–98.0)
MONO ABS: 1.1 10*3/uL — AB (ref 0.1–0.9)
MONOS PCT: 11 %
Neutro Abs: 6.5 10*3/uL (ref 1.5–6.5)
Neutrophils Relative %: 66 %
Platelet Count: 450 10*3/uL — ABNORMAL HIGH (ref 140–400)
RBC: 4.41 MIL/uL (ref 4.20–5.82)
RDW: 27.6 % — AB (ref 11.0–14.6)
WBC Count: 10 10*3/uL (ref 4.0–10.3)

## 2017-07-04 LAB — IRON AND TIBC
Iron: 37 ug/dL — ABNORMAL LOW (ref 42–163)
Saturation Ratios: 20 % — ABNORMAL LOW (ref 42–163)
TIBC: 186 ug/dL — AB (ref 202–409)
UIBC: 149 ug/dL

## 2017-07-04 LAB — CMP (CANCER CENTER ONLY)
ALBUMIN: 3.3 g/dL — AB (ref 3.5–5.0)
ALT: 18 U/L (ref 0–55)
AST: 20 U/L (ref 5–34)
Alkaline Phosphatase: 59 U/L (ref 40–150)
Anion gap: 8 (ref 3–11)
BUN: 11 mg/dL (ref 7–26)
CALCIUM: 9.4 mg/dL (ref 8.4–10.4)
CHLORIDE: 100 mmol/L (ref 98–109)
CO2: 26 mmol/L (ref 22–29)
CREATININE: 0.82 mg/dL (ref 0.70–1.30)
GFR, Estimated: >60 mL/min (ref >60)
Glucose, Bld: 62 mg/dL — ABNORMAL LOW (ref 70–140)
Potassium: 4.5 mmol/L (ref 3.5–5.1)
SODIUM: 134 mmol/L — AB (ref 136–145)
Total Bilirubin: 0.2 mg/dL (ref 0.2–1.2)
Total Protein: 6.9 g/dL (ref 6.4–8.3)

## 2017-07-04 LAB — FERRITIN: FERRITIN: 317 ng/mL — AB (ref 22–316)

## 2017-07-04 NOTE — Progress Notes (Signed)
Note left for me to see patient.  Went to introduce myself to patient and ask him questions related to needs. Patient will bring last paystub on 07/10/17 to apply for grant. I will put a note in for him to see me on that day.  I will review treatment plan for any other available resources.  Patient has my card for any additional financial questions or concerns.  Called patient to also ask if he would be able to maintain his insurance coverage or if it was through his previous employer. Patient states he will be able to keep it through the marketplace.

## 2017-07-04 NOTE — Progress Notes (Signed)
[place]  Telephone:([phone]) [phone] Fax:(336) [phone]  Clinic Follow Up Note   Patient Care Team: [name], FNP as PCP - General (Family Medicine) 07/04/2017  CHIEF COMPLAINTS:  Follow up gastric Cancer  Oncology History   Cancer Staging Gastric cancer [facility]) Staging form: Stomach, AJCC 8th Edition - Clinical stage from 06/04/2017: Stage IVB (cT4, cN0, cM1) - Signed by [name], MD on 06/10/2017       Gastric cancer ([place])   06/03/2017 - 06/06/2017 Hospital Admission    Admitted to the hospital on 06/03/17 with complaints of fatigue and melena. During his stay he underwent an endoscopy that revealed a malignant gastric tumor in the cardia and in the gastric fundus. Biopsy results revealed adenocarcinoma. Pt was discharged on 06/06/17.      06/04/2017 Initial Biopsy    Diagnosis 06/04/17 Stomach, biopsy, Fundus - ADENOCARCINOMA - SEE COMMENT Microscopic Comment The stomach biopsy is of an adenocarcinoma arising in a background of intestinal metaplasia. A Warthin-Starry stain is performed to determine the possibility of the presence of Helicobacter pylori. The Warthin-Starry stain is negative for organisms morphologically consistent with Helicobacter pylori.      06/04/2017 Procedure    Esophagogastroduodenoscopy 06/04/17 by Dr. Benson Norway Impression: Normal esophagus. Malignant gastric tumor in the cardia and in the  gastric fundus. Biopsied. Normal examined duodenum.      06/05/2017 Imaging    CT CAP W Contrast 06/05/17 IMPRESSION: 9.1 cm fungating mass in the gastric cardia/posterior gastric fundus, corresponding to the patient's newly diagnosed gastric Cancer. Associated extra gastric extension with peritoneal disease in the left upper abdomen, including a dominant 4.2 cm peritoneal implant. Small volume pelvic ascites. 1.8 cm hypoenhancing lesion along the inferior spleen, indeterminate.      06/10/2017 Initial Diagnosis    Gastric cancer (HCC)         HISTORY OF PRESENTING ILLNESS:  Ryan Collins 57 y.o. male is a here because of newly diagnosed gastric cancer. The patient was referred by hospitalist after her recent hospital discharge.  The patient presents to the clinic today by himself.   Pt went to urgent care on 05/15/17 with complaints of worsening fatigue, central abdominal pain, black stool for 4 months, and 50 lbs weight loss in the past 6 months. discomfort over the past few months. He described the pain as sharp and dull that is worse after he eats. The pain would last 30-40 minutes after eating. Pt was then seen in the ED and admitted to the hospital on 06/03/17 with complaints of fatigue and melena. During his stay he underwent an endoscopy that revealed a malignant gastric tumor in the cardia and in the gastric fundus. Biopsy of the mass revealed adenocarcinoma.  Patient received blood transfusion and 1 dose of IV Feraheme, tolerated well.  His fatigue has much improved.  Pt was dischared on 06/06/17.    Patient is single, no children, lives with his brother in a trailer, he has multiple brothers and sisters and some of them live in the town.  Her mother also lives in [place].  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  He is concerned about his finances if he is not able to continue working.   CURRENT THERAPY: first line chemo [id number] every 2 weeks, started on 06/26/2017  INTERIM HISTORY: Ryan Collins returns for follow-up after first cycle chemotherapy.  He tolerated well overall, he developed hiccups after chemo, it lasted for several days, resolved a few days ago.  He had a mild fatigue, recovered well.  No other complaints.  He has been eating and drinking adequately.  But he has lost a total of 16 pounds in the past month, stable since last week.  He drinks Ensure twice a day.   MEDICAL HISTORY:  Past Medical History:  Diagnosis Date  . DVT (deep venous thrombosis) ([place]) 2011  . GI bleeding 2001  .  History of DVT of lower extremity   . Melena 05/2017  . Stomach ulcer    Clinically suspected; No EGD confirmation as of 06/03/17    SURGICAL HISTORY: Past Surgical History:  Procedure Laterality Date  . ESOPHAGOGASTRODUODENOSCOPY  06/04/2017  . ESOPHAGOGASTRODUODENOSCOPY N/A 06/04/2017   Procedure: ESOPHAGOGASTRODUODENOSCOPY (EGD);  Surgeon: Carol Ada, MD;  Location: [facility];  Service: Endoscopy;  Laterality: N/A;  . IR FLUORO GUIDE PORT INSERTION RIGHT  06/20/2017  . IR US GUIDE VASC ACCESS RIGHT  06/20/2017    SOCIAL HISTORY: Social History   Socioeconomic History  . Marital status: Legally Separated    Spouse name: Not on file  . Number of children: Not on file  . Years of education: Not on file  . Highest education level: Not on file  Social Needs  . Financial resource strain: Not on file  . Food insecurity - worry: Not on file  . Food insecurity - inability: Not on file  . Transportation needs - medical: Not on file  . Transportation needs - non-medical: Not on file  Occupational History  . Not on file  Tobacco Use  . Smoking status: Never Smoker  . Smokeless tobacco: Never Used  Substance and Sexual Activity  . Alcohol use: Yes    Alcohol/week: 3.0 oz    Types: 5 Cans of beer per week    Comment: 05/2017 i QUIT DRINKING 4 MONTHS AGO "  . Drug use: Yes    Types: Marijuana    Comment: occ  . Sexual activity: Not on file  Other Topics Concern  . Not on file  Social History Narrative  . Not on file    FAMILY HISTORY: Family History  Problem Relation Age of Onset  . Hypertension Mother   . Cancer Sister 34       breast    ALLERGIES:  has No Known Allergies.  MEDICATIONS:  Current Outpatient Medications  Medication Sig Dispense Refill  . amLODipine (NORVASC) 5 MG tablet Take 1 tablet (5 mg total) by mouth daily. 30 tablet 0  . lidocaine-prilocaine (EMLA) cream Apply to affected area once 30 g 3  . ondansetron (ZOFRAN) 8 MG tablet Take 1 tablet (8  mg total) by mouth 2 (two) times daily as needed for refractory nausea / vomiting. Start on day 3 after chemotherapy. 30 tablet 1  . pantoprazole (PROTONIX) 40 MG tablet Take 1 tablet (40 mg total) by mouth 2 (two) times daily. 60 tablet 0  . prochlorperazine (COMPAZINE) 10 MG tablet Take 1 tablet (10 mg total) by mouth every 6 (six) hours as needed (Nausea or vomiting). 30 tablet 1  . thiamine 100 MG tablet Take 1 tablet (100 mg total) by mouth daily. 30 tablet 0  . traMADol (ULTRAM) 50 MG tablet Take 1 tablet (50 mg total) by mouth every 6 (six) hours as needed. 30 tablet 0   No current facility-administered medications for this visit.     REVIEW OF SYSTEMS:   Constitutional: Denies fevers, chills or abnormal night sweats, (+) over 50 pound weight loss in the past  6 months, (+) fatigue has improved after recent blood transfusion and IV iron Eyes: Denies blurriness of vision, double vision or watery eyes Ears, nose, mouth, throat, and face: Denies mucositis or sore throat Respiratory: Denies cough, dyspnea or wheezes Cardiovascular: Denies palpitation, chest discomfort or lower extremity swelling Gastrointestinal:  Denies nausea, heartburN, (+) intermittent epigastric pain, and melena Skin: Denies abnormal skin rashes Lymphatics: Denies new lymphadenopathy or easy bruising Neurological:Denies numbness, tingling or new weaknesses Behavioral/Psych: Mood is stable, no new changes  All other systems were reviewed with the patient and are negative.  PHYSICAL EXAMINATION: ECOG PERFORMANCE STATUS: 1 - Symptomatic but completely ambulatory  Vitals:   07/04/17 1238  BP: (!) 147/97  Pulse: 78  Resp: 18  Temp: 99 F (37.2 C)  SpO2: 100%   Filed Weights   07/04/17 1238  Weight: 152 lb 12.8 oz (69.3 kg)    GENERAL:alert, no distress and comfortable SKIN: skin color, texture, turgor are normal, no rashes or significant lesions EYES: normal, conjunctiva are pink and non-injected, sclera  clear OROPHARYNX:no exudate, no erythema and lips, buccal mucosa, and tongue normal  NECK: supple, thyroid normal size, non-tender, without nodularity LYMPH:  no palpable lymphadenopathy in the cervical, axillary or inguinal LUNGS: clear to auscultation and percussion with normal breathing effort HEART: regular rate & rhythm and no murmurs and no lower extremity edema ABDOMEN:abdomen soft, non-tender and normal bowel sounds Musculoskeletal:no cyanosis of digits and no clubbing  PSYCH: alert & oriented x 3 with fluent speech NEURO: no focal motor/sensory deficits  LABORATORY DATA:  I have reviewed the data as listed CBC Latest Ref Rng & Units 07/04/2017 06/26/2017 06/20/2017  WBC 4.0 - 10.3 K/uL 10.0 11.3(H) 10.6(H)  Hemoglobin 13.0 - 17.0 g/dL - - 11.0(L)  Hematocrit 38.4 - 49.9 % 34.4(L) 34.2(L) 34.7(L)  Platelets 140 - 400 K/uL 450(H) 545(H) 651(H)    CMP Latest Ref Rng & Units 07/04/2017 06/26/2017 06/13/2017  Glucose 70 - 140 mg/dL 62(L) 95 86  BUN 7 - 26 mg/dL 11 14 13   Creatinine 0.70 - 1.30 mg/dL 0.82 0.83 0.83  Sodium 136 - 145 mmol/L 134(L) 135(L) 136  Potassium 3.5 - 5.1 mmol/L 4.5 3.6 3.9  Chloride 98 - 109 mmol/L 100 102 105  CO2 22 - 29 mmol/L 26 24 26   Calcium 8.4 - 10.4 mg/dL 9.4 9.3 8.3(L)  Total Protein 6.4 - 8.3 g/dL 6.9 7.1 6.0(L)  Total Bilirubin 0.2 - 1.2 mg/dL 0.2 0.4 0.3  Alkaline Phos 40 - 150 U/L 59 55 52  AST 5 - 34 U/L 20 11 12   ALT 0 - 55 U/L 18 <6 4   PATHOLOGY  Diagnosis 06/04/17 Stomach, biopsy, Fundus - ADENOCARCINOMA - SEE COMMENT Microscopic Comment The stomach biopsy is of an adenocarcinoma arising in a background of intestinal metaplasia. A Warthin-Starry stain is performed to determine the possibility of the presence of Helicobacter pylori. The Warthin-Starry stain is negative for organisms morphologically consistent with Helicobacter pylori.  RADIOGRAPHIC STUDIES: I have personally reviewed the radiological images as listed and agreed with  the findings in the report. Ct Chest W Contrast  Result Date: 06/05/2017 CLINICAL DATA:  Cough, mid abdominal pain, progressive fatigue, melena, weight loss. Gastric cancer, initial workup. EXAM: CT CHEST, ABDOMEN, AND PELVIS WITH CONTRAST TECHNIQUE: Multidetector CT imaging of the chest, abdomen and pelvis was performed following the standard protocol during bolus administration of intravenous contrast. CONTRAST:  160m ISOVUE-300 IOPAMIDOL (ISOVUE-300) INJECTION 61% COMPARISON:  None. FINDINGS: CT CHEST FINDINGS Cardiovascular: The  heart is normal in size. No pericardial effusion. No evidence of thoracic aortic aneurysm. Mediastinum/Nodes: No suspicious mediastinal lymphadenopathy. Visualized thyroid is unremarkable. Lungs/Pleura: Mild centrilobular emphysematous changes, upper lobe predominant. No suspicious pulmonary nodules. Small bilateral pleural effusions, left greater than right. Mild dependent atelectasis in the bilateral lower lobes. No pneumothorax. Musculoskeletal: Degenerative changes of the lower thoracic spine. CT ABDOMEN PELVIS FINDINGS Hepatobiliary: Mildly heterogeneous perfusion of the liver. Gallbladder is unremarkable. No intrahepatic or extrahepatic ductal dilatation. Pancreas: Within normal limits. Spleen: 1.8 cm hypoenhancing lesion along the inferior spleen (series 3/image 69), indeterminate. Adrenals/Urinary Tract: Adrenal glands are within normal limits. 2.2 cm mildly complex right lower pole renal cyst with a rim calcification and possible thin septation, measuring just higher than simple fluid density, but without convincing enhancement (series 3/image 93), benign (Bosniak II). Left kidney is within normal limits. No hydronephrosis. Bladder is underdistended but unremarkable. Stomach/Bowel: Large fungating mass in the gastric cardia/posterior gastric fundus, measuring approximately 5.3 x 9.1 x 5.5 cm (series 3/image 60), likely corresponding to the patient's newly diagnosed gastric  cancer. No evidence of bowel obstruction. Jejunojejunal intussusception in the right mid abdomen (series 3/image 103), likely transient/inconsequential. Appendix is not discretely visualized. Vascular/Lymphatic: No evidence of abdominal aortic aneurysm. No suspicious abdominopelvic lymphadenopathy. Reproductive: Prostate is grossly unremarkable. Other: Small volume pelvic ascites. Abnormal soft tissue in the left upper abdomen (series 3/image 66), reflecting extra gastric extension of tumor, including a dominant 3.5 x 4.2 cm peritoneal implant. Musculoskeletal: Mild degenerative changes of the lower lumbar spine. IMPRESSION: 9.1 cm fungating mass in the gastric cardia/posterior gastric fundus, corresponding to the patient's newly diagnosed gastric cancer. Associated extra gastric extension with peritoneal disease in the left upper abdomen, including a dominant 4.2 cm peritoneal implant. Small volume pelvic ascites. 1.8 cm hypoenhancing lesion along the inferior spleen, indeterminate. Electronically Signed   By: Julian Hy M.D.   On: 06/05/2017 09:02   Ct Abdomen Pelvis W Contrast  Result Date: 06/05/2017 CLINICAL DATA:  Cough, mid abdominal pain, progressive fatigue, melena, weight loss. Gastric cancer, initial workup. EXAM: CT CHEST, ABDOMEN, AND PELVIS WITH CONTRAST TECHNIQUE: Multidetector CT imaging of the chest, abdomen and pelvis was performed following the standard protocol during bolus administration of intravenous contrast. CONTRAST:  111m ISOVUE-300 IOPAMIDOL (ISOVUE-300) INJECTION 61% COMPARISON:  None. FINDINGS: CT CHEST FINDINGS Cardiovascular: The heart is normal in size. No pericardial effusion. No evidence of thoracic aortic aneurysm. Mediastinum/Nodes: No suspicious mediastinal lymphadenopathy. Visualized thyroid is unremarkable. Lungs/Pleura: Mild centrilobular emphysematous changes, upper lobe predominant. No suspicious pulmonary nodules. Small bilateral pleural effusions, left greater  than right. Mild dependent atelectasis in the bilateral lower lobes. No pneumothorax. Musculoskeletal: Degenerative changes of the lower thoracic spine. CT ABDOMEN PELVIS FINDINGS Hepatobiliary: Mildly heterogeneous perfusion of the liver. Gallbladder is unremarkable. No intrahepatic or extrahepatic ductal dilatation. Pancreas: Within normal limits. Spleen: 1.8 cm hypoenhancing lesion along the inferior spleen (series 3/image 69), indeterminate. Adrenals/Urinary Tract: Adrenal glands are within normal limits. 2.2 cm mildly complex right lower pole renal cyst with a rim calcification and possible thin septation, measuring just higher than simple fluid density, but without convincing enhancement (series 3/image 93), benign (Bosniak II). Left kidney is within normal limits. No hydronephrosis. Bladder is underdistended but unremarkable. Stomach/Bowel: Large fungating mass in the gastric cardia/posterior gastric fundus, measuring approximately 5.3 x 9.1 x 5.5 cm (series 3/image 60), likely corresponding to the patient's newly diagnosed gastric cancer. No evidence of bowel obstruction. Jejunojejunal intussusception in the right mid abdomen (series 3/image 103),  likely transient/inconsequential. Appendix is not discretely visualized. Vascular/Lymphatic: No evidence of abdominal aortic aneurysm. No suspicious abdominopelvic lymphadenopathy. Reproductive: Prostate is grossly unremarkable. Other: Small volume pelvic ascites. Abnormal soft tissue in the left upper abdomen (series 3/image 66), reflecting extra gastric extension of tumor, including a dominant 3.5 x 4.2 cm peritoneal implant. Musculoskeletal: Mild degenerative changes of the lower lumbar spine. IMPRESSION: 9.1 cm fungating mass in the gastric cardia/posterior gastric fundus, corresponding to the patient's newly diagnosed gastric cancer. Associated extra gastric extension with peritoneal disease in the left upper abdomen, including a dominant 4.2 cm peritoneal  implant. Small volume pelvic ascites. 1.8 cm hypoenhancing lesion along the inferior spleen, indeterminate. Electronically Signed   By: Julian Hy M.D.   On: 06/05/2017 09:02   Ir US Guide Vasc Access Right  Result Date: 06/20/2017 CLINICAL DATA:  Gastric carcinoma, needs durable venous access for chemotherapy EXAM: TUNNELED PORT CATHETER PLACEMENT WITH ULTRASOUND AND FLUOROSCOPIC GUIDANCE FLUOROSCOPY TIME:  0.1 minute; 11  uGym2 DAP ANESTHESIA/SEDATION: Intravenous Fentanyl and Versed were administered as conscious sedation during continuous monitoring of the patient's level of consciousness and physiological / cardiorespiratory status by the radiology RN, with a total moderate sedation time of 14 minutes. TECHNIQUE: The procedure, risks, benefits, and alternatives were explained to the patient. Questions regarding the procedure were encouraged and answered. The patient understands and consents to the procedure. As antibiotic prophylaxis, cefazolin 2 g was ordered pre-procedure and administered intravenously within one hour of incision. Patency of the right IJ vein was confirmed with ultrasound with image documentation. An appropriate skin site was determined. Skin site was marked. Region was prepped using maximum barrier technique including cap and mask, sterile gown, sterile gloves, large sterile sheet, and Chlorhexidine as cutaneous antisepsis. The region was infiltrated locally with 1% lidocaine. Under real-time ultrasound guidance, the right IJ vein was accessed with a 21 gauge micropuncture needle; the needle tip within the vein was confirmed with ultrasound image documentation. Needle was exchanged over a 018 guidewire for transitional dilator which allowed passage of the [facility] wire into the IVC. Over this, the transitional dilator was exchanged for a 5 [place] MPA catheter. A small incision was made on the right anterior chest wall and a subcutaneous pocket fashioned. The power-injectable port was  positioned and its catheter tunneled to the right IJ dermatotomy site. The MPA catheter was exchanged over an Amplatz wire for a peel-away sheath, through which the port catheter, which had been trimmed to the appropriate length, was advanced and positioned under fluoroscopy with its tip at the cavoatrial junction. Spot chest radiograph confirms good catheter position and no pneumothorax. The pocket was closed with deep interrupted and subcuticular continuous 3-0 Monocryl sutures. The port was flushed per protocol. The incisions were covered with Dermabond then covered with a sterile dressing. COMPLICATIONS: COMPLICATIONS None immediate IMPRESSION: Technically successful right IJ power-injectable port catheter placement. Ready for routine use. Electronically Signed   By: Lucrezia Europe M.D.   On: 06/20/2017 18:06   Ir Fluoro Guide Port Insertion Right  Result Date: 06/20/2017 CLINICAL DATA:  Gastric carcinoma, needs durable venous access for chemotherapy EXAM: TUNNELED PORT CATHETER PLACEMENT WITH ULTRASOUND AND FLUOROSCOPIC GUIDANCE FLUOROSCOPY TIME:  0.1 minute; 11  uGym2 DAP ANESTHESIA/SEDATION: Intravenous Fentanyl and Versed were administered as conscious sedation during continuous monitoring of the patient's level of consciousness and physiological / cardiorespiratory status by the radiology RN, with a total moderate sedation time of 14 minutes. TECHNIQUE: The procedure, risks, benefits, and alternatives were explained to  the patient. Questions regarding the procedure were encouraged and answered. The patient understands and consents to the procedure. As antibiotic prophylaxis, cefazolin 2 g was ordered pre-procedure and administered intravenously within one hour of incision. Patency of the right IJ vein was confirmed with ultrasound with image documentation. An appropriate skin site was determined. Skin site was marked. Region was prepped using maximum barrier technique including cap and mask, sterile gown,  sterile gloves, large sterile sheet, and Chlorhexidine as cutaneous antisepsis. The region was infiltrated locally with 1% lidocaine. Under real-time ultrasound guidance, the right IJ vein was accessed with a 21 gauge micropuncture needle; the needle tip within the vein was confirmed with ultrasound image documentation. Needle was exchanged over a 018 guidewire for transitional dilator which allowed passage of the [facility] wire into the IVC. Over this, the transitional dilator was exchanged for a 5 [place] MPA catheter. A small incision was made on the right anterior chest wall and a subcutaneous pocket fashioned. The power-injectable port was positioned and its catheter tunneled to the right IJ dermatotomy site. The MPA catheter was exchanged over an Amplatz wire for a peel-away sheath, through which the port catheter, which had been trimmed to the appropriate length, was advanced and positioned under fluoroscopy with its tip at the cavoatrial junction. Spot chest radiograph confirms good catheter position and no pneumothorax. The pocket was closed with deep interrupted and subcuticular continuous 3-0 Monocryl sutures. The port was flushed per protocol. The incisions were covered with Dermabond then covered with a sterile dressing. COMPLICATIONS: COMPLICATIONS None immediate IMPRESSION: Technically successful right IJ power-injectable port catheter placement. Ready for routine use. Electronically Signed   By: Lucrezia Europe M.D.   On: 06/20/2017 18:06   CT CAP W Contrast 06/05/17 IMPRESSION: 9.1 cm fungating mass in the gastric cardia/posterior gastric fundus, corresponding to the patient's newly diagnosed gastric Cancer. Associated extra gastric extension with peritoneal disease in the left upper abdomen, including a dominant 4.2 cm peritoneal implant. Small volume pelvic ascites. 1.8 cm hypoenhancing lesion along the inferior spleen, indeterminate.  PROCEDURE  Esophagogastroduodenoscopy 06/04/17 by Dr.  Benson Norway Findings: The esophagus was normal. A large, fungating and ulcerated, non-circumferential mass with no bleeding and no stigmata of recent bleeding was found in the cardia and in the gastric fundus. Biopsies were taken with a cold forceps for histology. The examined duodenum was normal. A large friable fungating gastric mass involving the cardia and the fundus was identified. The mass extended approximately 5 cm. It was difficult to measure the width of the lesion. There was a large necrotic and deep ulcer in the mid portion of the mass. It is estimated that it encompasses 25% of the fundic circumfirence. Multiple cold biopsies were obtained. Impression: Normal esophagus. Malignant gastric tumor in the cardia and in the  gastric fundus. Biopsied. Normal examined duodenum.  ASSESSMENT & PLAN:  Ryan Collins 57 y.o. male, without significant past medical history, but also does not  see doctors routinely, presented with worsening fatigue, intermittent epigastric pain and melena for 3-4 months, and 50 pounds weight loss over the past 6 months.  1.  Gastric cancer, [id number] with peritoneal metastasis, adenocarcinoma, HER2(-), MSI-pending, PD-L1 pending  -I previously reviewed his CT scan findings, endoscopy and biopsy results of the gastric mass with patient in details. He has a large mass in the cardia and in the gastric fundus, with evidence of extra gastric extension, and the peritoneum metastasis on CT scan.  No other distant metastasis. -Due to the peritoneal metastasis,  unfortunately he is cancer is not curable at this stage. We discussed the role of HIPEC if he has excellent response to chemotherapy, and no evidence of other metastasis, I may consider refer him to Dr. Lennie Odor in the future. Although due to the aggressive nature of gastric cancer, HIPEC is usually not offered.  -I will present his case in our GI tumor board, to get a surgical input. -I have started him on first-line chemotherapy with  FOLFOX, he has been tolerating well so far. -Goal of therapy is palliative. -His tumor is negative for HER-2, MSI and PDL wants to pending -Lab reviewed.  he will return next week for cycle 2 chemotherapy. -will repeat scan after cycle 4-5  2.  Anemia of iron deficiency and tumor bleeding, B12 deficiency -He has clinical GI bleeding with melena, iron study also showed iron deficiency. He has received blood transfusion, and 1 dose of IV Feraheme- -I will set up a second dose of IV Feraheme within the next week -Monitor his CBC and iron studies closely -He will continue Oral-B complex   3. History of DVT, ?  Protein C deficiency -he was on coumadin for his DVT before, and previous labs showed a protein C deficiency, which increases his risk of thrombosis. -We discussed the high risk of thrombosis secondary to his underlying malignancy. -To his chronic GI bleeding from his tumor, I would not do prophylactic anticoagulation.  4. History of alcohol and marijuana abuse -He has quit since his diagnosis of cancer  5. Weight loss and malnutrition  -He has lost significant weight since the diagnosis -Encouraged him to increase his nutrition supplement.  He has been seen by our dietitian, will follow-up next week.  6. Goal of care discussion  -We again discussed the incurable nature of his cancer, and the overall poor prognosis, especially if he does not have good response to chemotherapy or progress on chemo -The patient understands the goal of care is palliative. -he is full code now     No orders of the defined types were placed in this encounter.    PLAN: -lab reviewed, no need IV fluids today.  He will return next week for cycle 2 chemotherapy -He is scheduled to see dietitian next week  All questions were answered. The patient knows to call the clinic with any problems, questions or concerns. I spent 50 minutes counseling the patient face to face. The total time spent in the  appointment was 65 minutes and more than 50% was on counseling.     [name], MD 07/04/2017

## 2017-07-06 ENCOUNTER — Encounter: Payer: Self-pay | Admitting: Hematology

## 2017-07-08 NOTE — Progress Notes (Signed)
[place]  Telephone:(336) [phone] Fax:([phone]  Clinic Follow Up Note   Patient Care Team: [name], FNP as PCP - General (Family Medicine) 07/10/2017  CHIEF COMPLAINTS:  Follow up gastric Cancer  Oncology History   Cancer Staging Gastric cancer [facility]) Staging form: Stomach, AJCC 8th Edition - Clinical stage from 06/04/2017: Stage IVB (cT4, cN0, cM1) - Signed by [name], MD on 06/10/2017       Gastric cancer ([place])   06/03/2017 - 06/06/2017 Hospital Admission    Admitted to the hospital on 06/03/17 with complaints of fatigue and melena. During his stay he underwent an endoscopy that revealed a malignant gastric tumor in the cardia and in the gastric fundus. Biopsy results revealed adenocarcinoma. Pt was discharged on 06/06/17.      06/04/2017 Initial Biopsy    Diagnosis 06/04/17 Stomach, biopsy, Fundus - ADENOCARCINOMA - SEE COMMENT Microscopic Comment The stomach biopsy is of an adenocarcinoma arising in a background of intestinal metaplasia. A Warthin-Starry stain is performed to determine the possibility of the presence of Helicobacter pylori. The Warthin-Starry stain is negative for organisms morphologically consistent with Helicobacter pylori.      06/04/2017 Procedure    Esophagogastroduodenoscopy 06/04/17 by Dr. Benson Norway Impression: Normal esophagus. Malignant gastric tumor in the cardia and in the  gastric fundus. Biopsied. Normal examined duodenum.      06/05/2017 Imaging    CT CAP W Contrast 06/05/17 IMPRESSION: 9.1 cm fungating mass in the gastric cardia/posterior gastric fundus, corresponding to the patient's newly diagnosed gastric Cancer. Associated extra gastric extension with peritoneal disease in the left upper abdomen, including a dominant 4.2 cm peritoneal implant. Small volume pelvic ascites. 1.8 cm hypoenhancing lesion along the inferior spleen, indeterminate.      06/10/2017 Initial Diagnosis    Gastric cancer (HCC)         HISTORY OF PRESENTING ILLNESS:  Ryan Collins 57 y.o. male is a here because of newly diagnosed gastric cancer. The patient was referred by hospitalist after her recent hospital discharge.  The patient presents to the clinic today by himself.   Pt went to urgent care on 05/15/17 with complaints of worsening fatigue, central abdominal pain, black stool for 4 months, and 50 lbs weight loss in the past 6 months. discomfort over the past few months. He described the pain as sharp and dull that is worse after he eats. The pain would last 30-40 minutes after eating. Pt was then seen in the ED and admitted to the hospital on 06/03/17 with complaints of fatigue and melena. During his stay he underwent an endoscopy that revealed a malignant gastric tumor in the cardia and in the gastric fundus. Biopsy of the mass revealed adenocarcinoma.  Patient received blood transfusion and 1 dose of IV Feraheme, tolerated well.  His fatigue has much improved.  Pt was dischared on 06/06/17.    Patient is single, no children, lives with his brother in a trailer, he has multiple brothers and sisters and some of them live in the town.  Her mother also lives in [place].  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  He is concerned about his finances if he is not able to continue working.   CURRENT THERAPY: first line chemo FOLFOX every 2 weeks, started on 06/26/2017  INTERIM HISTORY:  Ryan Collins returns for follow-up and Cycle 2 FOLFOX. He presents to the clinic today by himself. He reports that he tolerated the 1st cycle of chemo well overall.  He has gained 8 lbs since last visit. He notes to have some pain that he is taking Tylenol for. He has tried Tramadol, but states it upsets his stomach.   On review of systems, pt denies decreased appetite,cough, chest discomfort or any other complaints at this time. Pertinent positives are listed and detailed within the above HPI.   MEDICAL HISTORY:  Past  Medical History:  Diagnosis Date  . DVT (deep venous thrombosis) ([place]) 2011  . GI bleeding 2001  . History of DVT of lower extremity   . Melena 05/2017  . Stomach ulcer    Clinically suspected; No EGD confirmation as of 06/03/17    SURGICAL HISTORY: Past Surgical History:  Procedure Laterality Date  . ESOPHAGOGASTRODUODENOSCOPY  06/04/2017  . ESOPHAGOGASTRODUODENOSCOPY N/A 06/04/2017   Procedure: ESOPHAGOGASTRODUODENOSCOPY (EGD);  Surgeon: Carol Ada, MD;  Location: [place];  Service: Endoscopy;  Laterality: N/A;  . IR FLUORO GUIDE PORT INSERTION RIGHT  06/20/2017  . IR US GUIDE VASC ACCESS RIGHT  06/20/2017    SOCIAL HISTORY: Social History   Socioeconomic History  . Marital status: Legally Separated    Spouse name: Not on file  . Number of children: Not on file  . Years of education: Not on file  . Highest education level: Not on file  Social Needs  . Financial resource strain: Not on file  . Food insecurity - worry: Not on file  . Food insecurity - inability: Not on file  . Transportation needs - medical: Not on file  . Transportation needs - non-medical: Not on file  Occupational History  . Not on file  Tobacco Use  . Smoking status: Never Smoker  . Smokeless tobacco: Never Used  Substance and Sexual Activity  . Alcohol use: Yes    Alcohol/week: 3.0 oz    Types: 5 Cans of beer per week    Comment: 05/2017 i QUIT DRINKING 4 MONTHS AGO "  . Drug use: Yes    Types: Marijuana    Comment: occ  . Sexual activity: Not on file  Other Topics Concern  . Not on file  Social History Narrative  . Not on file    FAMILY HISTORY: Family History  Problem Relation Age of Onset  . Hypertension Mother   . Cancer Sister 45       breast    ALLERGIES:  has No Known Allergies.  MEDICATIONS:  No current facility-administered medications for this visit.    Current Outpatient Medications  Medication Sig Dispense Refill  . acetaminophen (TYLENOL) 500 MG tablet Take 500  mg by mouth every 6 (six) hours as needed.    Marland Kitchen amLODipine (NORVASC) 5 MG tablet Take 1 tablet (5 mg total) by mouth daily. 30 tablet 0  . lidocaine-prilocaine (EMLA) cream Apply to affected area once 30 g 3  . ondansetron (ZOFRAN) 8 MG tablet Take 1 tablet (8 mg total) by mouth 2 (two) times daily as needed for refractory nausea / vomiting. Start on day 3 after chemotherapy. 30 tablet 1  . prochlorperazine (COMPAZINE) 10 MG tablet Take 1 tablet (10 mg total) by mouth every 6 (six) hours as needed (Nausea or vomiting). 30 tablet 1  . pantoprazole (PROTONIX) 40 MG tablet Take 1 tablet (40 mg total) by mouth 2 (two) times daily. 60 tablet 3  . thiamine 100 MG tablet Take 1 tablet (100 mg total) by mouth daily. 30 tablet 3   Facility-Administered Medications Ordered in Other Visits  Medication Dose Route Frequency Provider  Last Rate Last Dose  . fluorouracil (ADRUCIL) 4,700 mg in sodium chloride 0.9 % 56 mL chemo infusion  2,400 mg/m2 (Treatment Plan Recorded) Intravenous 1 day or 1 dose [name], MD      . fluorouracil (ADRUCIL) chemo injection 800 mg  400 mg/m2 (Treatment Plan Recorded) Intravenous Once [name], MD      . leucovorin 780 mg in dextrose 5 % 250 mL infusion  400 mg/m2 (Treatment Plan Recorded) Intravenous Once [name], MD      . oxaliplatin (ELOXATIN) 165 mg in dextrose 5 % 500 mL chemo infusion  85 mg/m2 (Treatment Plan Recorded) Intravenous Once [name], MD        REVIEW OF SYSTEMS:   Constitutional: Denies fevers, chills or abnormal night sweats, (+) over 50 pound weight loss in the past 6 months, improving, (+) fatigue has improved after recent blood transfusion and IV iron Eyes: Denies blurriness of vision, double vision or watery eyes Ears, nose, mouth, throat, and face: Denies mucositis or sore throat Respiratory: Denies cough, dyspnea or wheezes Cardiovascular: Denies palpitation, chest discomfort or lower extremity swelling Gastrointestinal:  Denies nausea,  heartburN, (+) intermittent epigastric pain, and melena Skin: Denies abnormal skin rashes Lymphatics: Denies new lymphadenopathy or easy bruising Neurological:Denies numbness, tingling or new weaknesses Behavioral/Psych: Mood is stable, no new changes  All other systems were reviewed with the patient and are negative.  PHYSICAL EXAMINATION: ECOG PERFORMANCE STATUS: 1 - Symptomatic but completely ambulatory  Vitals:   07/10/17 0843  BP: 116/70  Pulse: 88  Resp: 18  Temp: 98.4 F (36.9 C)  SpO2: 100%   Filed Weights   07/10/17 0843  Weight: 160 lb 11.2 oz (72.9 kg)    GENERAL:alert, no distress and comfortable SKIN: skin color, texture, turgor are normal, no rashes or significant lesions EYES: normal, conjunctiva are pink and non-injected, sclera clear OROPHARYNX:no exudate, no erythema and lips, buccal mucosa, and tongue normal  NECK: supple, thyroid normal size, non-tender, without nodularity LYMPH:  no palpable lymphadenopathy in the cervical, axillary or inguinal LUNGS: clear to auscultation and percussion with normal breathing effort HEART: regular rate & rhythm and no murmurs and no lower extremity edema ABDOMEN:abdomen soft, non-tender and normal bowel sounds Musculoskeletal:no cyanosis of digits and no clubbing  PSYCH: alert & oriented x 3 with fluent speech NEURO: no focal motor/sensory deficits  LABORATORY DATA:  I have reviewed the data as listed CBC Latest Ref Rng & Units 07/10/2017 07/04/2017 06/26/2017  WBC 4.0 - 10.3 K/uL 9.6 10.0 11.3(H)  Hemoglobin 13.0 - 17.0 g/dL - - -  Hematocrit 38.4 - 49.9 % 31.7(L) 34.4(L) 34.2(L)  Platelets 140 - 400 K/uL 327 450(H) 545(H)    CMP Latest Ref Rng & Units 07/10/2017 07/04/2017 06/26/2017  Glucose 70 - 140 mg/dL 89 62(L) 95  BUN 7 - 26 mg/dL 13 11 14   Creatinine 0.70 - 1.30 mg/dL 0.83 0.82 0.83  Sodium 136 - 145 mmol/L 137 134(L) 135(L)  Potassium 3.5 - 5.1 mmol/L 4.3 4.5 3.6  Chloride 98 - 109 mmol/L 104 100 102  CO2  22 - 29 mmol/L 25 26 24   Calcium 8.4 - 10.4 mg/dL 9.2 9.4 9.3  Total Protein 6.4 - 8.3 g/dL 6.6 6.9 7.1  Total Bilirubin 0.2 - 1.2 mg/dL 0.2 0.2 0.4  Alkaline Phos 40 - 150 U/L 49 59 55  AST 5 - 34 U/L 14 20 11   ALT 0 - 55 U/L 17 18 <6   PATHOLOGY  Diagnosis 06/04/17  Stomach, biopsy, Fundus - ADENOCARCINOMA - SEE COMMENT Microscopic Comment The stomach biopsy is of an adenocarcinoma arising in a background of intestinal metaplasia. A Warthin-Starry stain is performed to determine the possibility of the presence of Helicobacter pylori. The Warthin-Starry stain is negative for organisms morphologically consistent with Helicobacter pylori.  RADIOGRAPHIC STUDIES: I have personally reviewed the radiological images as listed and agreed with the findings in the report. Ir US Guide Vasc Access Right  Result Date: 06/20/2017 CLINICAL DATA:  Gastric carcinoma, needs durable venous access for chemotherapy EXAM: TUNNELED PORT CATHETER PLACEMENT WITH ULTRASOUND AND FLUOROSCOPIC GUIDANCE FLUOROSCOPY TIME:  0.1 minute; 11  uGym2 DAP ANESTHESIA/SEDATION: Intravenous Fentanyl and Versed were administered as conscious sedation during continuous monitoring of the patient's level of consciousness and physiological / cardiorespiratory status by the radiology RN, with a total moderate sedation time of 14 minutes. TECHNIQUE: The procedure, risks, benefits, and alternatives were explained to the patient. Questions regarding the procedure were encouraged and answered. The patient understands and consents to the procedure. As antibiotic prophylaxis, cefazolin 2 g was ordered pre-procedure and administered intravenously within one hour of incision. Patency of the right IJ vein was confirmed with ultrasound with image documentation. An appropriate skin site was determined. Skin site was marked. Region was prepped using maximum barrier technique including cap and mask, sterile gown, sterile gloves, large sterile sheet, and  Chlorhexidine as cutaneous antisepsis. The region was infiltrated locally with 1% lidocaine. Under real-time ultrasound guidance, the right IJ vein was accessed with a 21 gauge micropuncture needle; the needle tip within the vein was confirmed with ultrasound image documentation. Needle was exchanged over a 018 guidewire for transitional dilator which allowed passage of the [facility] wire into the IVC. Over this, the transitional dilator was exchanged for a 5 [place] MPA catheter. A small incision was made on the right anterior chest wall and a subcutaneous pocket fashioned. The power-injectable port was positioned and its catheter tunneled to the right IJ dermatotomy site. The MPA catheter was exchanged over an Amplatz wire for a peel-away sheath, through which the port catheter, which had been trimmed to the appropriate length, was advanced and positioned under fluoroscopy with its tip at the cavoatrial junction. Spot chest radiograph confirms good catheter position and no pneumothorax. The pocket was closed with deep interrupted and subcuticular continuous 3-0 Monocryl sutures. The port was flushed per protocol. The incisions were covered with Dermabond then covered with a sterile dressing. COMPLICATIONS: COMPLICATIONS None immediate IMPRESSION: Technically successful right IJ power-injectable port catheter placement. Ready for routine use. Electronically Signed   By: Lucrezia Europe M.D.   On: 06/20/2017 18:06   Ir Fluoro Guide Port Insertion Right  Result Date: 06/20/2017 CLINICAL DATA:  Gastric carcinoma, needs durable venous access for chemotherapy EXAM: TUNNELED PORT CATHETER PLACEMENT WITH ULTRASOUND AND FLUOROSCOPIC GUIDANCE FLUOROSCOPY TIME:  0.1 minute; 11  uGym2 DAP ANESTHESIA/SEDATION: Intravenous Fentanyl and Versed were administered as conscious sedation during continuous monitoring of the patient's level of consciousness and physiological / cardiorespiratory status by the radiology RN, with a total  moderate sedation time of 14 minutes. TECHNIQUE: The procedure, risks, benefits, and alternatives were explained to the patient. Questions regarding the procedure were encouraged and answered. The patient understands and consents to the procedure. As antibiotic prophylaxis, cefazolin 2 g was ordered pre-procedure and administered intravenously within one hour of incision. Patency of the right IJ vein was confirmed with ultrasound with image documentation. An appropriate skin site was determined. Skin site was marked. Region  was prepped using maximum barrier technique including cap and mask, sterile gown, sterile gloves, large sterile sheet, and Chlorhexidine as cutaneous antisepsis. The region was infiltrated locally with 1% lidocaine. Under real-time ultrasound guidance, the right IJ vein was accessed with a 21 gauge micropuncture needle; the needle tip within the vein was confirmed with ultrasound image documentation. Needle was exchanged over a 018 guidewire for transitional dilator which allowed passage of the [facility] wire into the IVC. Over this, the transitional dilator was exchanged for a 5 [place] MPA catheter. A small incision was made on the right anterior chest wall and a subcutaneous pocket fashioned. The power-injectable port was positioned and its catheter tunneled to the right IJ dermatotomy site. The MPA catheter was exchanged over an Amplatz wire for a peel-away sheath, through which the port catheter, which had been trimmed to the appropriate length, was advanced and positioned under fluoroscopy with its tip at the cavoatrial junction. Spot chest radiograph confirms good catheter position and no pneumothorax. The pocket was closed with deep interrupted and subcuticular continuous 3-0 Monocryl sutures. The port was flushed per protocol. The incisions were covered with Dermabond then covered with a sterile dressing. COMPLICATIONS: COMPLICATIONS None immediate IMPRESSION: Technically successful right IJ  power-injectable port catheter placement. Ready for routine use. Electronically Signed   By: Lucrezia Europe M.D.   On: 06/20/2017 18:06   CT CAP W Contrast 06/05/17 IMPRESSION: 9.1 cm fungating mass in the gastric cardia/posterior gastric fundus, corresponding to the patient's newly diagnosed gastric Cancer. Associated extra gastric extension with peritoneal disease in the left upper abdomen, including a dominant 4.2 cm peritoneal implant. Small volume pelvic ascites. 1.8 cm hypoenhancing lesion along the inferior spleen, indeterminate.  PROCEDURE  Esophagogastroduodenoscopy 06/04/17 by Dr. Benson Norway Findings: The esophagus was normal. A large, fungating and ulcerated, non-circumferential mass with no bleeding and no stigmata of recent bleeding was found in the cardia and in the gastric fundus. Biopsies were taken with a cold forceps for histology. The examined duodenum was normal. A large friable fungating gastric mass involving the cardia and the fundus was identified. The mass extended approximately 5 cm. It was difficult to measure the width of the lesion. There was a large necrotic and deep ulcer in the mid portion of the mass. It is estimated that it encompasses 25% of the fundic circumfirence. Multiple cold biopsies were obtained. Impression: Normal esophagus. Malignant gastric tumor in the cardia and in the  gastric fundus. Biopsied. Normal examined duodenum.  ASSESSMENT & PLAN:  Ryan Collins 57 y.o. male, without significant past medical history, but also does not  see doctors routinely, presented with worsening fatigue, intermittent epigastric pain and melena for 3-4 months, and 50 pounds weight loss over the past 6 months.  1.  Gastric cancer, [id number] with peritoneal metastasis, adenocarcinoma, HER2(-), MSI-pending, PD-L1 pending  -I previously reviewed his CT scan findings, endoscopy and biopsy results of the gastric mass with patient in details. He has a large mass in the cardia and in the  gastric fundus, with evidence of extra gastric extension, and the peritoneum metastasis on CT scan.  No other distant metastasis. -Due to the peritoneal metastasis, unfortunately his cancer is not curable at this stage. We discussed the role of HIPEC if he has excellent response to chemotherapy, and no evidence of other metastasis, I may consider refer him to Dr. Lennie Odor in the future. Although due to the aggressive nature of gastric cancer, HIPEC is usually not offered.  -I will present his case  in our GI tumor board, to get a surgical input. -I have started him on first-line chemotherapy with FOLFOX, he has been tolerating well so far. -Goal of therapy is palliative. -His tumor is negative for HER-2, MSI and PDL are still pending -Lab reviewed, his blood counts are mildly low other than ANC of 6.7. continue with Cycle 2 of FOLFOX today.  -Discontinued tramadol due too poor tolerance  -will repeat scan after cycle 4-5 -F/u in 2 weeks   2.  Anemia of iron deficiency and tumor bleeding, B12 deficiency -He has clinical GI bleeding with melena, iron study also showed iron deficiency. -He has received blood transfusion, and 2 dose of IV Feraheme, repeated iron level was adequate.   -We continue to monitor his CBC and iron studies closely -He will continue Oral-B complex   3. History of DVT, ?  Protein C deficiency -he was on coumadin for his DVT before, and previous labs showed a protein C deficiency, which increases his risk of thrombosis. -We previously discussed the high risk of thrombosis secondary to his underlying malignancy. -Due to his chronic GI bleeding from his tumor, I would not do prophylactic anticoagulation.  4. History of alcohol and marijuana abuse -He has quit drinking alcohol since his diagnosis of cancer -He does smoke marijuana occasionally  5. Weight loss and malnutrition  -He has lost significant weight since the diagnosis -Encouraged him to increase his nutrition  supplement.  He has been seen by our dietitian, will follow-up next week. -He is gaining weight now   6. Goal of care discussion  -We again discussed the incurable nature of his cancer, and the overall poor prognosis, especially if he does not have good response to chemotherapy or progress on chemo -The patient understands the goal of care is palliative. -he is full code now   No orders of the defined types were placed in this encounter.   PLAN: -lab reviewed, will proceed cycle 2 FOLFOX today -continue lab, flush and chemo every 2 weeks, see NP in 2 weeks, and me in 4 weeks  -MSI and PDL are still pending -GI tumor board next week  -He will meet with Finance office today  All questions were answered. The patient knows to call the clinic with any problems, questions or concerns. I spent 20 minutes counseling the patient face to face. The total time spent in the appointment was 25 minutes and more than 50% was on counseling.   This document serves as a record of services personally performed by [name], MD. It was created on her behalf by Theresia Bough, a trained medical scribe. The creation of this record is based on the scribe's personal observations and the provider's statements to them.   I have reviewed the above documentation for accuracy and completeness, and I agree with the above.    [name], MD 07/10/2017 11:13 AM   Addendum  Patient received IV iron in the infusion room, and developed severe epigastric abdominal pain, and became diaphoretic.  Rapid response team was called, his vital signs were stable, EKG was unremarkable.  He was sent to emergency room for further evaluation.  Due to his T4 gastric tumor, perforation needed to be ruled out.  [name]  07/10/2017 11:22 AM

## 2017-07-09 ENCOUNTER — Ambulatory Visit: Payer: BLUE CROSS/BLUE SHIELD | Admitting: Hematology

## 2017-07-10 ENCOUNTER — Emergency Department (HOSPITAL_COMMUNITY): Payer: BLUE CROSS/BLUE SHIELD

## 2017-07-10 ENCOUNTER — Inpatient Hospital Stay: Payer: BLUE CROSS/BLUE SHIELD

## 2017-07-10 ENCOUNTER — Inpatient Hospital Stay (HOSPITAL_BASED_OUTPATIENT_CLINIC_OR_DEPARTMENT_OTHER): Payer: BLUE CROSS/BLUE SHIELD | Admitting: Hematology

## 2017-07-10 ENCOUNTER — Encounter: Payer: Self-pay | Admitting: Hematology

## 2017-07-10 ENCOUNTER — Encounter (HOSPITAL_COMMUNITY): Payer: Self-pay | Admitting: Emergency Medicine

## 2017-07-10 ENCOUNTER — Other Ambulatory Visit: Payer: Self-pay

## 2017-07-10 ENCOUNTER — Emergency Department (HOSPITAL_COMMUNITY)
Admission: EM | Admit: 2017-07-10 | Discharge: 2017-07-10 | Disposition: A | Discharge status: Home / Self Care | Payer: BLUE CROSS/BLUE SHIELD | Attending: Emergency Medicine | Admitting: Emergency Medicine

## 2017-07-10 ENCOUNTER — Inpatient Hospital Stay: Payer: BLUE CROSS/BLUE SHIELD | Admitting: Nutrition

## 2017-07-10 ENCOUNTER — Ambulatory Visit (HOSPITAL_BASED_OUTPATIENT_CLINIC_OR_DEPARTMENT_OTHER): Payer: BLUE CROSS/BLUE SHIELD | Admitting: Medical

## 2017-07-10 VITALS — BP 139/86 | HR 113 | Temp 98.4°F | Resp 16

## 2017-07-10 VITALS — BP 116/70 | HR 88 | Temp 98.4°F | Resp 18 | Ht 71.5 in | Wt 160.7 lb

## 2017-07-10 DIAGNOSIS — C161 Malignant neoplasm of fundus of stomach: Secondary | ICD-10-CM

## 2017-07-10 DIAGNOSIS — R634 Abnormal weight loss: Secondary | ICD-10-CM | POA: Diagnosis not present

## 2017-07-10 DIAGNOSIS — C786 Secondary malignant neoplasm of retroperitoneum and peritoneum: Secondary | ICD-10-CM | POA: Diagnosis not present

## 2017-07-10 DIAGNOSIS — Z79899 Other long term (current) drug therapy: Secondary | ICD-10-CM | POA: Insufficient documentation

## 2017-07-10 DIAGNOSIS — I10 Essential (primary) hypertension: Secondary | ICD-10-CM | POA: Insufficient documentation

## 2017-07-10 DIAGNOSIS — E538 Deficiency of other specified B group vitamins: Secondary | ICD-10-CM | POA: Diagnosis not present

## 2017-07-10 DIAGNOSIS — Z86718 Personal history of other venous thrombosis and embolism: Secondary | ICD-10-CM | POA: Diagnosis not present

## 2017-07-10 DIAGNOSIS — R1084 Generalized abdominal pain: Secondary | ICD-10-CM | POA: Diagnosis present

## 2017-07-10 DIAGNOSIS — G893 Neoplasm related pain (acute) (chronic): Secondary | ICD-10-CM | POA: Diagnosis not present

## 2017-07-10 DIAGNOSIS — D500 Iron deficiency anemia secondary to blood loss (chronic): Secondary | ICD-10-CM

## 2017-07-10 DIAGNOSIS — C168 Malignant neoplasm of overlapping sites of stomach: Secondary | ICD-10-CM | POA: Diagnosis not present

## 2017-07-10 DIAGNOSIS — D002 Carcinoma in situ of stomach: Secondary | ICD-10-CM | POA: Insufficient documentation

## 2017-07-10 DIAGNOSIS — K922 Gastrointestinal hemorrhage, unspecified: Secondary | ICD-10-CM

## 2017-07-10 DIAGNOSIS — D6859 Other primary thrombophilia: Secondary | ICD-10-CM | POA: Diagnosis not present

## 2017-07-10 DIAGNOSIS — D508 Other iron deficiency anemias: Secondary | ICD-10-CM

## 2017-07-10 DIAGNOSIS — T7840XA Allergy, unspecified, initial encounter: Secondary | ICD-10-CM

## 2017-07-10 DIAGNOSIS — R1013 Epigastric pain: Secondary | ICD-10-CM

## 2017-07-10 DIAGNOSIS — R1012 Left upper quadrant pain: Secondary | ICD-10-CM

## 2017-07-10 DIAGNOSIS — R61 Generalized hyperhidrosis: Secondary | ICD-10-CM

## 2017-07-10 LAB — CMP (CANCER CENTER ONLY)
ALBUMIN: 3.4 g/dL — AB (ref 3.5–5.0)
ALT: 17 U/L (ref 0–55)
ANION GAP: 8 (ref 3–11)
AST: 14 U/L (ref 5–34)
Alkaline Phosphatase: 49 U/L (ref 40–150)
BILIRUBIN TOTAL: 0.2 mg/dL (ref 0.2–1.2)
BUN: 13 mg/dL (ref 7–26)
CHLORIDE: 104 mmol/L (ref 98–109)
CO2: 25 mmol/L (ref 22–29)
Calcium: 9.2 mg/dL (ref 8.4–10.4)
Creatinine: 0.83 mg/dL (ref 0.70–1.30)
GFR, Est AFR Am: >60 mL/min (ref >60)
GFR, Estimated: >60 mL/min (ref >60)
GLUCOSE: 89 mg/dL (ref 70–140)
POTASSIUM: 4.3 mmol/L (ref 3.5–5.1)
SODIUM: 137 mmol/L (ref 136–145)
TOTAL PROTEIN: 6.6 g/dL (ref 6.4–8.3)

## 2017-07-10 LAB — CBC WITH DIFFERENTIAL (CANCER CENTER ONLY)
BASOS ABS: 0 10*3/uL (ref 0.0–0.1)
Basophils Relative: 0 %
Eosinophils Absolute: 0 10*3/uL (ref 0.0–0.5)
Eosinophils Relative: 0 %
HEMATOCRIT: 31.7 % — AB (ref 38.4–49.9)
Hemoglobin: 10 g/dL — ABNORMAL LOW (ref 13.0–17.1)
LYMPHS ABS: 1.8 10*3/uL (ref 0.9–3.3)
LYMPHS PCT: 19 %
MCH: 25 pg — AB (ref 27.2–33.4)
MCHC: 31.5 g/dL — AB (ref 32.0–36.0)
MCV: 79.3 fL (ref 79.3–98.0)
MONO ABS: 1.1 10*3/uL — AB (ref 0.1–0.9)
MONOS PCT: 11 %
Neutro Abs: 6.7 10*3/uL — ABNORMAL HIGH (ref 1.5–6.5)
Neutrophils Relative %: 70 %
Platelet Count: 327 10*3/uL (ref 140–400)
RBC: 4 MIL/uL — ABNORMAL LOW (ref 4.20–5.82)
RDW: 27.1 % — AB (ref 11.0–14.6)
WBC Count: 9.6 10*3/uL (ref 4.0–10.3)

## 2017-07-10 LAB — CEA (IN HOUSE-CHCC): CEA (CHCC-In House): 106.44 ng/mL — ABNORMAL HIGH (ref 0.00–5.00)

## 2017-07-10 MED ORDER — PANTOPRAZOLE SODIUM 40 MG PO TBEC: 40.0000 mg | DELAYED_RELEASE_TABLET | Freq: Two times a day (BID) | ORAL | 3 refills | Status: DC

## 2017-07-10 MED ORDER — PALONOSETRON HCL INJECTION 0.25 MG/5ML
0.2500 mg | Freq: Once | INTRAVENOUS | Status: AC
  Administered 2017-07-10: 0.25 mg via INTRAVENOUS

## 2017-07-10 MED ORDER — DEXAMETHASONE SODIUM PHOSPHATE 10 MG/ML IJ SOLN
10.0000 mg | Freq: Once | INTRAMUSCULAR | Status: AC
  Administered 2017-07-10: 10 mg via INTRAVENOUS

## 2017-07-10 MED ORDER — DEXTROSE 5 % IV SOLN
Freq: Once | INTRAVENOUS | Status: AC
  Administered 2017-07-10: 15:00:00 via INTRAVENOUS

## 2017-07-10 MED ORDER — DEXAMETHASONE SODIUM PHOSPHATE 10 MG/ML IJ SOLN
INTRAMUSCULAR | Status: AC
  Filled 2017-07-10: qty 1

## 2017-07-10 MED ORDER — THIAMINE HCL 100 MG PO TABS: 100.0000 mg | ORAL_TABLET | Freq: Every day | ORAL | 3 refills | Status: DC

## 2017-07-10 MED ORDER — PALONOSETRON HCL INJECTION 0.25 MG/5ML
INTRAVENOUS | Status: AC
  Filled 2017-07-10: qty 10

## 2017-07-10 MED ORDER — OXALIPLATIN CHEMO INJECTION 100 MG/20ML
85.0000 mg/m2 | Freq: Once | INTRAVENOUS | Status: AC
  Administered 2017-07-10: 165 mg via INTRAVENOUS
  Filled 2017-07-10: qty 33

## 2017-07-10 MED ORDER — DEXTROSE 5 % IV SOLN
Freq: Once | INTRAVENOUS | Status: AC
  Administered 2017-07-10: 10:00:00 via INTRAVENOUS

## 2017-07-10 MED ORDER — SODIUM CHLORIDE 0.9% FLUSH
10.0000 mL | INTRAVENOUS | Status: DC | PRN
  Administered 2017-07-10: 10 mL
  Filled 2017-07-10: qty 10

## 2017-07-10 MED ORDER — LEUCOVORIN CALCIUM INJECTION 350 MG
400.0000 mg/m2 | Freq: Once | INTRAVENOUS | Status: AC
  Administered 2017-07-10: 780 mg via INTRAVENOUS
  Filled 2017-07-10: qty 39

## 2017-07-10 MED ORDER — FLUOROURACIL CHEMO INJECTION 2.5 GM/50ML
400.0000 mg/m2 | Freq: Once | INTRAVENOUS | Status: DC
  Filled 2017-07-10: qty 16

## 2017-07-10 MED ORDER — OXALIPLATIN CHEMO INJECTION 100 MG/20ML
85.0000 mg/m2 | Freq: Once | INTRAVENOUS | Status: DC
  Filled 2017-07-10: qty 33

## 2017-07-10 MED ORDER — SODIUM CHLORIDE 0.9 % IV SOLN
510.0000 mg | Freq: Once | INTRAVENOUS | Status: AC
  Administered 2017-07-10: 510 mg via INTRAVENOUS
  Filled 2017-07-10: qty 17

## 2017-07-10 MED ORDER — FLUOROURACIL CHEMO INJECTION 2.5 GM/50ML
400.0000 mg/m2 | Freq: Once | INTRAVENOUS | Status: AC
  Administered 2017-07-10: 800 mg via INTRAVENOUS
  Filled 2017-07-10: qty 16

## 2017-07-10 MED ORDER — SODIUM CHLORIDE 0.9 % IV SOLN
2400.0000 mg/m2 | INTRAVENOUS | Status: DC
  Filled 2017-07-10: qty 94

## 2017-07-10 MED ORDER — LEUCOVORIN CALCIUM INJECTION 350 MG
400.0000 mg/m2 | Freq: Once | INTRAVENOUS | Status: DC
  Filled 2017-07-10: qty 39

## 2017-07-10 MED ORDER — SODIUM CHLORIDE 0.9 % IV SOLN
2400.0000 mg/m2 | INTRAVENOUS | Status: AC
  Administered 2017-07-10: 4700 mg via INTRAVENOUS
  Filled 2017-07-10: qty 94

## 2017-07-10 NOTE — ED Notes (Signed)
Patient transported to X-ray 

## 2017-07-10 NOTE — Patient Instructions (Signed)
[facility] Discharge Instructions for Patients Receiving Chemotherapy  Today you received the following chemotherapy agents:  Oxaliplatin, Leucovorin, and 5FU.  To help prevent nausea and vomiting after your treatment, we encourage you to take your nausea medication as directed.   If you develop nausea and vomiting that is not controlled by your nausea medication, call the clinic.   BELOW ARE SYMPTOMS THAT SHOULD BE REPORTED IMMEDIATELY:  *FEVER GREATER THAN 100.5 F  *CHILLS WITH OR WITHOUT FEVER  NAUSEA AND VOMITING THAT IS NOT CONTROLLED WITH YOUR NAUSEA MEDICATION  *UNUSUAL SHORTNESS OF BREATH  *UNUSUAL BRUISING OR BLEEDING  TENDERNESS IN MOUTH AND THROAT WITH OR WITHOUT PRESENCE OF ULCERS  *URINARY PROBLEMS  *BOWEL PROBLEMS  UNUSUAL RASH Items with * indicate a potential emergency and should be followed up as soon as possible.  Feel free to call the clinic should you have any questions or concerns. The clinic phone number is [phone].  Please show the [facility] at check-in to the Emergency Department and triage nurse.  Ferumoxytol injection What is this medicine? FERUMOXYTOL is an iron complex. Iron is used to make healthy red blood cells, which carry oxygen and nutrients throughout the body. This medicine is used to treat iron deficiency anemia in people with chronic kidney disease. This medicine may be used for other purposes; ask your health care provider or pharmacist if you have questions. COMMON BRAND NAME(S): Feraheme What should I tell my health care provider before I take this medicine? They need to know if you have any of these conditions: -anemia not caused by low iron levels -high levels of iron in the blood -magnetic resonance imaging (MRI) test scheduled -an unusual or allergic reaction to iron, other medicines, foods, dyes, or preservatives -pregnant or trying to get pregnant -breast-feeding How should I use this  medicine? This medicine is for injection into a vein. It is given by a health care professional in a hospital or clinic setting. Talk to your pediatrician regarding the use of this medicine in children. Special care may be needed. Overdosage: If you think you have taken too much of this medicine contact a poison control center or emergency room at once. NOTE: This medicine is only for you. Do not share this medicine with others. What if I miss a dose? It is important not to miss your dose. Call your doctor or health care professional if you are unable to keep an appointment. What may interact with this medicine? This medicine may interact with the following medications: -other iron products This list may not describe all possible interactions. Give your health care provider a list of all the medicines, herbs, non-prescription drugs, or dietary supplements you use. Also tell them if you smoke, drink alcohol, or use illegal drugs. Some items may interact with your medicine. What should I watch for while using this medicine? Visit your doctor or healthcare professional regularly. Tell your doctor or healthcare professional if your symptoms do not start to get better or if they get worse. You may need blood work done while you are taking this medicine. You may need to follow a special diet. Talk to your doctor. Foods that contain iron include: whole grains/cereals, dried fruits, beans, or peas, leafy green vegetables, and organ meats (liver, kidney). What side effects may I notice from receiving this medicine? Side effects that you should report to your doctor or health care professional as soon as possible: -allergic reactions like skin rash, itching or hives,  swelling of the face, lips, or tongue -breathing problems -changes in blood pressure -feeling faint or lightheaded, falls -fever or chills -flushing, sweating, or hot feelings -swelling of the ankles or feet Side effects that usually do not  require medical attention (report to your doctor or health care professional if they continue or are bothersome): -diarrhea -headache -nausea, vomiting -stomach pain This list may not describe all possible side effects. Call your doctor for medical advice about side effects. You may report side effects to FDA at 1-800-FDA-1088. Where should I keep my medicine? This drug is given in a hospital or clinic and will not be stored at home. NOTE: This sheet is a summary. It may not cover all possible information. If you have questions about this medicine, talk to your doctor, pharmacist, or health care provider.  2018 Elsevier/Gold Standard (2015-06-02 12:41:49)

## 2017-07-10 NOTE — Progress Notes (Signed)
Nutrition follow up scheduled but could not be completed. Patient is being transferred to the hospital.

## 2017-07-10 NOTE — Progress Notes (Signed)
Patient experienced severe abdominal pain after completion of Feraheme.  C/O sharp, shooting pain, "all over my stomach."  Patient diaphoretic, tachycardic.  VS obtained per flowsheets.  Sandi Mealy, PA-C notified and assessed patient in infusion room. Rapid response notified.  Dr. Burr Medico assessed patient as well.  Patient was assisted to ED.

## 2017-07-10 NOTE — ED Notes (Signed)
Called cancer center and spoke with Jan to see if patient needs to return there for further chemo treatments or if he good to discharge home.

## 2017-07-10 NOTE — ED Notes (Signed)
ED Provider at bedside. 

## 2017-07-10 NOTE — ED Notes (Signed)
Cancer center called and spoke to [employer], who escorted patient back to cancer center. Patient left discharge paperwork here in ED.

## 2017-07-10 NOTE — Discharge Instructions (Signed)
All the results in the ER are normal. We are not sure what is causing your symptoms. The workup in the ER is not complete, and is limited to screening for life threatening and emergent conditions only, so please see a primary care doctor for further evaluation.  Return to the emergency room immediately if you start having severe abdominal pain, or if you start having any allergic reaction like symptoms such as wheezing, shortness of breath, difficulty in breathing, itching along with your abdominal pain.

## 2017-07-10 NOTE — ED Notes (Signed)
Bed: WA04 Expected date:  Expected time:  Means of arrival:  Comments: Cancer Center/chest pain

## 2017-07-10 NOTE — ED Triage Notes (Signed)
Pt come from Cancer center for severe onset of abd pains and diaphorisis to rule out perf due to cancer tumor location. Patient had his iron infusion before severe pains started.

## 2017-07-10 NOTE — Addendum Note (Signed)
Addended by: Zola Button on: 07/10/2017 03:19 PM   Modules accepted: Orders

## 2017-07-10 NOTE — Addendum Note (Signed)
Addended by: Neysa Hotter on: 07/10/2017 02:46 PM   Modules accepted: Orders

## 2017-07-10 NOTE — ED Provider Notes (Addendum)
[facility] DEPT Provider Note   CSN: [id number] Arrival date & time: 07/10/17  1112     History   Chief Complaint Chief Complaint  Patient presents with  . sent from Cancer center to rule out perf  . Abdominal Pain    HPI Foster Frericks is a 57 y.o. male.  HPI  57 year old male with history of gastric cancer comes in with chief complaint of abdominal pain.  Patient was getting his chemotherapy infusion, when he suddenly started having severe abdominal pain with associated nausea.  Patient states that his pain was 8 out of 10, but he felt a lot better during my evaluation.  Patient stated that his pain was about 2 out of 10.  The pain was severe, generalized, nonradiating.  Patient denies any associated nausea or vomiting.  Patient had similar pain once before in the past while he was getting chemo infusion.  Patient was getting chemo and drinking soda at the same time when the pain started.  Past Medical History:  Diagnosis Date  . DVT (deep venous thrombosis) ([place]) 2011  . GI bleeding 2001  . History of DVT of lower extremity   . Melena 05/2017  . Stomach ulcer    Clinically suspected; No EGD confirmation as of 06/03/17    Patient Active Problem List   Diagnosis Date Noted  . Essential hypertension 06/12/2017  . Goals of care, counseling/discussion 06/11/2017  . Gastric cancer ([place]) 06/10/2017  . Upper GI bleed 06/03/2017  . Melena 06/03/2017  . Symptomatic anemia 06/03/2017  . Iron deficiency anemia 06/03/2017  . History of DVT (deep vein thrombosis) 06/03/2017  . Vitamin B12 deficiency 06/03/2017  . Hypokalemia 06/03/2017  . Alcohol abuse 06/03/2017    Past Surgical History:  Procedure Laterality Date  . ESOPHAGOGASTRODUODENOSCOPY  06/04/2017  . ESOPHAGOGASTRODUODENOSCOPY N/A 06/04/2017   Procedure: ESOPHAGOGASTRODUODENOSCOPY (EGD);  Surgeon: Carol Ada, MD;  Location: [facility];  Service: Endoscopy;  Laterality: N/A;  . IR  FLUORO GUIDE PORT INSERTION RIGHT  06/20/2017  . IR US GUIDE VASC ACCESS RIGHT  06/20/2017       Home Medications    Prior to Admission medications   Medication Sig Start Date End Date Taking? Authorizing Provider  acetaminophen (TYLENOL) 500 MG tablet Take 1,000 mg by mouth every 6 (six) hours as needed for mild pain.    Yes [provider]  Cyanocobalamin (VITAMIN B-12 PO) Take 1 tablet by mouth daily.   Yes [provider]  lidocaine-prilocaine (EMLA) cream Apply 1 application topically as needed (for port).   Yes [provider]  ondansetron (ZOFRAN) 8 MG tablet Take 8 mg by mouth 2 (two) times daily as needed for refractory nausea / vomiting. Start on day 3 after chemo   Yes [provider]  pantoprazole (PROTONIX) 40 MG tablet Take 1 tablet (40 mg total) by mouth 2 (two) times daily. 07/10/17 08/09/17 Yes [name], MD  prochlorperazine (COMPAZINE) 10 MG tablet Take 10 mg by mouth every 6 (six) hours as needed for nausea or vomiting.   Yes [provider]  thiamine 100 MG tablet Take 1 tablet (100 mg total) by mouth daily. 07/10/17  Yes [name], MD  amLODipine (NORVASC) 5 MG tablet Take 1 tablet (5 mg total) by mouth daily. 07/11/17   [name], FNP  Blood Pressure Monitoring (BLOOD PRESSURE MONITOR/M CUFF) MISC 1 each by Does not apply route daily. 07/11/17   [name], FNP    Family  History Family History  Problem Relation Age of Onset  . Hypertension Mother   . Cancer Sister 31       breast    Social History Social History   Tobacco Use  . Smoking status: Never Smoker  . Smokeless tobacco: Never Used  Substance Use Topics  . Alcohol use: Yes    Alcohol/week: 3.0 oz    Types: 5 Cans of beer per week    Comment: 05/2017 i QUIT DRINKING 4 MONTHS AGO "  . Drug use: Yes    Types: Marijuana    Comment: occ     Allergies   Patient has no known allergies.   Review of Systems Review of Systems  Constitutional:  Positive for activity change.  Gastrointestinal: Positive for abdominal pain.  Allergic/Immunologic: Positive for immunocompromised state.  Hematological: Bruises/bleeds easily.  All other systems reviewed and are negative.    Physical Exam Updated Vital Signs BP (!) 144/92   Pulse 77   Temp 98.4 F (36.9 C) (Oral)   Resp 17   SpO2 100%   Physical Exam  Constitutional: He is oriented to person, place, and time. He appears well-developed.  HENT:  Head: Atraumatic.  Neck: Neck supple.  Cardiovascular: Normal rate.  Pulmonary/Chest: Effort normal.  Abdominal: Soft. There is no tenderness. There is no guarding.  Neurological: He is alert and oriented to person, place, and time.  Skin: Skin is warm.  Nursing note and vitals reviewed.    ED Treatments / Results  Labs (all labs ordered are listed, but only abnormal results are displayed) Labs Reviewed - No data to display  EKG  EKG Interpretation None       Date: 07/26/2017  Rate: 82  Rhythm: normal sinus rhythm, PAC  QRS Axis: normal  Intervals: normal  ST/T Wave abnormalities: normal  Conduction Disutrbances: none  Narrative Interpretation: no acute findings.      Radiology No results found.  Procedures Procedures (including critical care time)  Medications Ordered in ED Medications - No data to display   Initial Impression / Assessment and Plan / ED Course  I have reviewed the triage vital signs and the nursing notes.  Pertinent labs & imaging results that were available during my care of the patient were reviewed by me and considered in my medical decision making (see chart for details).  Clinical Course as of Jul 27 1035  Wed Jul 10, 2017  1353 Continues to be pain free. Abd exam reveals no tenderness at all. There is no nausea, emesis. Will d/c. No indication for CT scan. AAS results discussed.  [AN]    Clinical Course User Index [AN] Varney Biles, MD    DDx  includes: Pancreatitis Hepatobiliary pathology including cholecystitis Gastritis/PUD SBO ACS syndrome  57 year old male comes in with chief complaint of severe abdominal pain.  Patient has history of gastric carcinoma and he was getting chemotherapy.  Pain was sudden onset, there was no associated wheezing, shortness of breath, rash, itching.  Pain had resolved after a few minutes.  On my abdominal exam patient actually had no tenderness at all.  No indication for CT scan based on my evaluation.  Differential diagnosis includes perforated viscus, gastritis, cancer related pain, hypersensitivity/anaphylaxis.  We will get acute abdominal series to ensure that there is no free air. Repeat exam will be completed to see if we need any imaging.  Patient had basic labs done prior to ED arrival which were reassuring.  Final Clinical Impressions(s) / ED Diagnoses  Final diagnoses:  Cancer associated pain  Hypersensitivity, initial encounter    ED Discharge Orders    None       Varney Biles, MD 07/10/17 Athalia, Lakesia Dahle, MD 07/26/17 1037

## 2017-07-11 ENCOUNTER — Encounter: Payer: Self-pay | Admitting: Family Medicine

## 2017-07-11 ENCOUNTER — Encounter: Payer: Self-pay | Admitting: Hematology

## 2017-07-11 ENCOUNTER — Ambulatory Visit (INDEPENDENT_AMBULATORY_CARE_PROVIDER_SITE_OTHER): Payer: BLUE CROSS/BLUE SHIELD | Admitting: Family Medicine

## 2017-07-11 DIAGNOSIS — I10 Essential (primary) hypertension: Secondary | ICD-10-CM

## 2017-07-11 MED ORDER — AMLODIPINE BESYLATE 5 MG PO TABS: 5.0000 mg | ORAL_TABLET | Freq: Every day | ORAL | 5 refills | Status: DC

## 2017-07-11 MED ORDER — BLOOD PRESSURE MONITOR/M CUFF MISC: 1.0000 | Freq: Every day | 0 refills | Status: DC

## 2017-07-11 MED FILL — VITAMIN B-1 100 MG TABS: 100 | 100 days supply | Qty: 100 | Fill #0

## 2017-07-11 NOTE — Progress Notes (Signed)
[facility] Progress Note   Ryan Collins [id number] [birth year] 57 y.o.  Ryan Collins is managed by Dr. Rip Harbour presents for:  Chemotherapy:  yes        Current Therapy: Feraheme and FOLFOX  Ryan Collins was receiving Feraheme at the time of his reaction.  First dose of Feraheme: yes  Last Treated with FOLFOX:  06/26/2017  Assessment: Plan:    Diaphoresis  Left upper quadrant pain  Ryan Collins was seen in the infusion room for a suspected infusion reaction. He had completed receiving Feraheme at the time of his reaction.  He had not yet begun to receive cycle 2 of FOLFOX. His symptoms included: Acute onset of severe left upper quadrant pain with profound diaphoresis. Ryan Collins had an EKG completed after onset of his symptoms.  The EKG returned negative for acute cardiac issues. Rapid response was contacted with Ryan Collins taken to the emergency room for evaluation.  Please see After Visit Summary for patient specific instructions.  Future Appointments  Date Time Provider [place]  07/12/2017  4:00 PM [facility] FLUSH NURSE 2 [facility] None  07/24/2017  9:30 AM [facility] LAB 6 [facility] None  07/24/2017 10:00 AM [facility] FLUSH NURSE 2 [facility] None  07/24/2017 10:30 AM Alla Feeling, NP [facility] None  07/24/2017 11:30 AM [facility] C10 [facility] None  07/26/2017  2:30 PM [facility] INJ NURSE [facility] None  08/07/2017  8:00 AM [facility] LAB 6 [facility] None  08/07/2017  8:15 AM [facility] FLUSH NURSE 2 [facility] None  08/07/2017  9:00 AM [facility] D13 [facility] None  08/09/2017  1:00 PM [facility] FLUSH NURSE 2 [facility] None  08/21/2017  8:00 AM [facility] LAB 4 [facility] None  08/21/2017  8:15 AM [facility] INJ NURSE [facility] None  08/21/2017  9:00 AM [facility] D13 [facility] None  08/23/2017  1:00 PM [facility] FLUSH NURSE 2 [facility] None  09/04/2017  8:00 AM [facility] LAB 3  [facility] None  09/04/2017  8:15 AM [facility] FLUSH NURSE 2 [facility] None  09/04/2017  9:00 AM [facility] D13 [facility] None  09/06/2017  1:00 PM [facility] FLUSH NURSE 2 [facility] None  01/08/2018  8:30 AM [name], FNP [facility] None    No orders of the defined types were placed in this encounter.      Subjective:   Patient ID:  Ryan Collins is a 57 y.o. (DOB [birth year]) male.  Chief Complaint: No chief complaint on file.   HPI Ryan Collins was seen in the infusion room for a suspected infusion reaction. He had completed receiving Feraheme at the time of his reaction.  He had not yet begun to receive cycle 2 of FOLFOX. His symptoms included: Acute onset of severe left upper quadrant pain with profound diaphoresis. Ryan Collins had an EKG completed after onset of his symptoms.  The EKG returned negative for acute cardiac issues. Rapid response was contacted with Ryan Collins taken to the emergency room for evaluation.  Medications: I have reviewed the patient's current medications.  Allergies: No Known Allergies  Past Medical History:  Diagnosis Date  . DVT (deep venous thrombosis) ([place]) 2011  . GI bleeding 2001  . History of DVT of lower extremity   . Melena 05/2017  . Stomach ulcer    Clinically suspected; No EGD confirmation as of 06/03/17    Past Surgical History:  Procedure Laterality Date  . ESOPHAGOGASTRODUODENOSCOPY  06/04/2017  . ESOPHAGOGASTRODUODENOSCOPY N/A 06/04/2017   Procedure: ESOPHAGOGASTRODUODENOSCOPY (EGD);  Surgeon: Carol Ada, MD;  Location: [facility];  Service: Endoscopy;  Laterality: N/A;  . IR FLUORO GUIDE PORT INSERTION RIGHT  06/20/2017  . IR US GUIDE VASC ACCESS RIGHT  06/20/2017    Family History  Problem Relation Age of Onset  . Hypertension Mother   . Cancer Sister 28       breast    Social History   Socioeconomic History  . Marital status: Legally Separated    Spouse name: Not on file  . Number of children: Not on  file  . Years of education: Not on file  . Highest education level: Not on file  Social Needs  . Financial resource strain: Not on file  . Food insecurity - worry: Not on file  . Food insecurity - inability: Not on file  . Transportation needs - medical: Not on file  . Transportation needs - non-medical: Not on file  Occupational History  . Not on file  Tobacco Use  . Smoking status: Never Smoker  . Smokeless tobacco: Never Used  Substance and Sexual Activity  . Alcohol use: Yes    Alcohol/week: 3.0 oz    Types: 5 Cans of beer per week    Comment: 05/2017 i QUIT DRINKING 4 MONTHS AGO "  . Drug use: Yes    Types: Marijuana    Comment: occ  . Sexual activity: Not on file  Other Topics Concern  . Not on file  Social History Narrative  . Not on file    Past Medical History, Surgical history, Social history, and Family history were reviewed and updated as appropriate.   Please see review of systems for further details on the patient's review from today.   Review of Systems:  Review of Systems  Constitutional: Positive for diaphoresis.  Respiratory: Negative for chest tightness and shortness of breath.   Cardiovascular: Negative for chest pain and palpitations.  Gastrointestinal: Positive for abdominal pain. Negative for nausea and vomiting.    Objective:   Physical Exam:  There were no vitals taken for this visit.  Physical Exam  HENT:  Head: Normocephalic and atraumatic.  Cardiovascular: Normal rate, regular rhythm and normal heart sounds. Exam reveals no gallop and no friction rub.  No murmur heard. Pulmonary/Chest: Effort normal and breath sounds normal. No respiratory distress. He has no wheezes. He has no rales.  Neurological: He is alert.  Skin: He is diaphoretic.  The patient was profoundly diaphoretic.    Lab Review:     Component Value Date/Time   NA 137 07/10/2017 0751   K 4.3 07/10/2017 0751   CL 104 07/10/2017 0751   CO2 25 07/10/2017 0751    GLUCOSE 89 07/10/2017 0751   BUN 13 07/10/2017 0751   CREATININE 0.83 07/10/2017 0751   CALCIUM 9.2 07/10/2017 0751   PROT 6.6 07/10/2017 0751   ALBUMIN 3.4 (L) 07/10/2017 0751   AST 14 07/10/2017 0751   ALT 17 07/10/2017 0751   ALKPHOS 49 07/10/2017 0751   BILITOT 0.2 07/10/2017 0751   GFRNONAA >60 07/10/2017 0751   GFRAA >60 07/10/2017 0751       Component Value Date/Time   WBC 9.6 07/10/2017 0751   WBC 10.6 (H) 06/20/2017 0741   RBC 4.00 (L) 07/10/2017 0751   HGB 11.0 (L) 06/20/2017 0741   HCT 31.7 (L) 07/10/2017 0751   PLT 327 07/10/2017 0751   MCV 79.3 07/10/2017 0751   MCH 25.0 (L) 07/10/2017 0751   MCHC 31.5 (L) 07/10/2017 0751   RDW 27.1 (H) 07/10/2017  0751   LYMPHSABS 1.8 07/10/2017 0751   MONOABS 1.1 (H) 07/10/2017 0751   EOSABS 0.0 07/10/2017 0751   BASOSABS 0.0 07/10/2017 0751   This patient was seen with Dr. Burr Medico with my treatment plan reviewed with her. She expressed agreement with my medical management of this patient.

## 2017-07-11 NOTE — Progress Notes (Signed)
Subjective:    Patient ID: Ryan Collins, male    DOB: 1960-09-02, 57 y.o.   MRN: [id number]  HPI Ryan Collins, a 57 year old male without significant medical history presents for a follow up of hypertension.  Patient has a history of a malignant gastric tumor in the cardia and  gastric fundus.  Patient is followed by Dr. [name], oncologist. Patient recently had a RLQ colostomy bag placed, which is self managed. Patient is also undergoing chemotherapy.  Patient currently resides with his brother, who is his primary support.  He has multiple brothers and sisters that make up his support system.  He was previously employed in [employer], patient has not work since diagnosis. Patient is following up on hypertension. Patient was started on amlodipine 5 mg 1 month ago and has been taking medication consistently. He follows a balanced diet. He say that he recently met with a nutritionist. Patient has had a decreased activity level due to generalized weakness.  He currently denies headache, dizziness, blurred vision, abdominal pain, syncope, or bilateral lower extremity edema.  IMPRESSION: 9.1 cm fungating mass in the gastric cardia/posterior gastric fundus, corresponding to the patient's newly diagnosed gastric cancer.  Associated extra gastric extension with peritoneal disease in the left upper abdomen, including a dominant 4.2 cm peritoneal implant.  Small volume pelvic ascites.  1.8 cm hypoenhancing lesion along the inferior spleen, indeterminate.  Social History   Socioeconomic History  . Marital status: Legally Separated    Spouse name: Not on file  . Number of children: Not on file  . Years of education: Not on file  . Highest education level: Not on file  Social Needs  . Financial resource strain: Not on file  . Food insecurity - worry: Not on file  . Food insecurity - inability: Not on file  . Transportation needs - medical: Not on file  . Transportation needs -  non-medical: Not on file  Occupational History  . Not on file  Tobacco Use  . Smoking status: Never Smoker  . Smokeless tobacco: Never Used  Substance and Sexual Activity  . Alcohol use: Yes    Alcohol/week: 3.0 oz    Types: 5 Cans of beer per week    Comment: 05/2017 i QUIT DRINKING 4 MONTHS AGO "  . Drug use: Yes    Types: Marijuana    Comment: occ  . Sexual activity: Not on file  Other Topics Concern  . Not on file  Social History Narrative  . Not on file   Immunization History  Administered Date(s) Administered  . Influenza,inj,Quad PF,6+ Mos 06/12/2017  . Pneumococcal Polysaccharide-23 06/12/2017    Review of Systems  HENT: Negative.   Respiratory: Negative.   Cardiovascular: Negative.   Gastrointestinal: Negative.   Endocrine: Negative for polydipsia, polyphagia and polyuria.  Musculoskeletal: Negative.   Allergic/Immunologic: Positive for immunocompromised state (gastric cancer).  Neurological: Negative.   Hematological: Negative.   Psychiatric/Behavioral: Negative.        Objective:   Physical Exam  Constitutional: He is oriented to person, place, and time.  HENT:  Head: Normocephalic and atraumatic.  Right Ear: External ear normal.  Nose: Nose normal.  Mouth/Throat: Oropharynx is clear and moist.  Pulmonary/Chest: Effort normal and breath sounds normal.  Abdominal: Soft. Bowel sounds are normal. There is generalized tenderness.   RLQ colostomy, intact.    Neurological: He is oriented to person, place, and time. He has normal reflexes.  Skin: Skin is dry.  Psychiatric: He has a  normal mood and affect. His behavior is normal. Judgment and thought content normal.       BP 128/87 (BP Location: Left Arm, Patient Position: Sitting, Cuff Size: Normal)   Pulse 97   Temp 98.4 F (36.9 C) (Oral)   Resp 16   Ht 5' 11.5" (1.816 m)   Wt 160 lb (72.6 kg)   SpO2 100%   BMI 22.00 kg/m  Assessment & Plan:   Essential hypertension Continue medication,  monitor blood pressure at home. Continue DASH diet. Reminder to go to the ER if any CP, SOB, nausea, dizziness, severe HA, changes vision/speech, left arm numbness and tingling and jaw pain.  Reviewed renal functioning, wnl  - amLODipine (NORVASC) 5 MG tablet; Take 1 tablet (5 mg total) by mouth daily.  Dispense: 30 tablet; Refill: 5 - Blood Pressure Monitoring (BLOOD PRESSURE MONITOR/M CUFF) MISC; 1 each by Does not apply route daily.  Dispense: 1 each; Refill: 0   Malignant neoplasm of fundus of stomach ([place]) Patient is followed by Dr. [name], oncologist.  Continue chemotherapy as scheduled   RTC: Will follow up for hypertension in 6 months.   Discussed the importance of following up with oncologist consistently   Donia Pounds  MSN, FNP-C [facility] [address] [place], [place] [address] [phone]

## 2017-07-11 NOTE — Progress Notes (Signed)
Met with patient today whom brought proof of his income for the [place]. Patient approved based on the one paystub for this year he was able to work and then had to discontinue working.  Gave patient a copy of the approval letter as well as the expense sheet along with the outpatient pharmacy information. Patient received a gas card today from the grant. He has my card for any additional financial questions or concerns.

## 2017-07-11 NOTE — Patient Instructions (Addendum)

## 2017-07-12 ENCOUNTER — Inpatient Hospital Stay: Payer: BLUE CROSS/BLUE SHIELD | Attending: Hematology

## 2017-07-12 VITALS — BP 128/84 | HR 77 | Temp 98.2°F | Resp 18

## 2017-07-12 DIAGNOSIS — D509 Iron deficiency anemia, unspecified: Secondary | ICD-10-CM | POA: Diagnosis not present

## 2017-07-12 DIAGNOSIS — D6859 Other primary thrombophilia: Secondary | ICD-10-CM | POA: Diagnosis not present

## 2017-07-12 DIAGNOSIS — E538 Deficiency of other specified B group vitamins: Secondary | ICD-10-CM | POA: Diagnosis not present

## 2017-07-12 DIAGNOSIS — R1012 Left upper quadrant pain: Secondary | ICD-10-CM | POA: Insufficient documentation

## 2017-07-12 DIAGNOSIS — D500 Iron deficiency anemia secondary to blood loss (chronic): Secondary | ICD-10-CM

## 2017-07-12 DIAGNOSIS — C786 Secondary malignant neoplasm of retroperitoneum and peritoneum: Secondary | ICD-10-CM | POA: Insufficient documentation

## 2017-07-12 DIAGNOSIS — Z79899 Other long term (current) drug therapy: Secondary | ICD-10-CM | POA: Insufficient documentation

## 2017-07-12 DIAGNOSIS — Z9221 Personal history of antineoplastic chemotherapy: Secondary | ICD-10-CM | POA: Insufficient documentation

## 2017-07-12 DIAGNOSIS — Z5111 Encounter for antineoplastic chemotherapy: Secondary | ICD-10-CM | POA: Insufficient documentation

## 2017-07-12 DIAGNOSIS — E46 Unspecified protein-calorie malnutrition: Secondary | ICD-10-CM | POA: Diagnosis not present

## 2017-07-12 DIAGNOSIS — Z86718 Personal history of other venous thrombosis and embolism: Secondary | ICD-10-CM | POA: Diagnosis not present

## 2017-07-12 DIAGNOSIS — Z452 Encounter for adjustment and management of vascular access device: Secondary | ICD-10-CM | POA: Diagnosis not present

## 2017-07-12 DIAGNOSIS — C161 Malignant neoplasm of fundus of stomach: Secondary | ICD-10-CM | POA: Diagnosis not present

## 2017-07-12 MED ORDER — SODIUM CHLORIDE 0.9% FLUSH
3.0000 mL | Freq: Once | INTRAVENOUS | Status: AC | PRN
  Administered 2017-07-12: 3 mL via INTRAVENOUS
  Filled 2017-07-12: qty 10

## 2017-07-12 MED ORDER — HEPARIN SOD (PORK) LOCK FLUSH 100 UNIT/ML IV SOLN
250.0000 [IU] | Freq: Once | INTRAVENOUS | Status: AC | PRN
  Administered 2017-07-12: 250 [IU]
  Filled 2017-07-12: qty 5

## 2017-07-17 ENCOUNTER — Encounter (HOSPITAL_COMMUNITY): Payer: Self-pay

## 2017-07-22 ENCOUNTER — Encounter (HOSPITAL_COMMUNITY): Payer: Self-pay | Admitting: Hematology

## 2017-07-24 ENCOUNTER — Inpatient Hospital Stay: Payer: BLUE CROSS/BLUE SHIELD

## 2017-07-24 ENCOUNTER — Inpatient Hospital Stay (HOSPITAL_BASED_OUTPATIENT_CLINIC_OR_DEPARTMENT_OTHER): Payer: BLUE CROSS/BLUE SHIELD | Admitting: Nurse Practitioner

## 2017-07-24 ENCOUNTER — Encounter: Payer: Self-pay | Admitting: Nurse Practitioner

## 2017-07-24 ENCOUNTER — Telehealth: Payer: Self-pay | Admitting: Nurse Practitioner

## 2017-07-24 VITALS — BP 131/76 | HR 79 | Temp 98.6°F | Resp 18 | Ht 71.5 in | Wt 164.9 lb

## 2017-07-24 DIAGNOSIS — D508 Other iron deficiency anemias: Secondary | ICD-10-CM | POA: Diagnosis not present

## 2017-07-24 DIAGNOSIS — E538 Deficiency of other specified B group vitamins: Secondary | ICD-10-CM

## 2017-07-24 DIAGNOSIS — C786 Secondary malignant neoplasm of retroperitoneum and peritoneum: Secondary | ICD-10-CM | POA: Diagnosis not present

## 2017-07-24 DIAGNOSIS — C161 Malignant neoplasm of fundus of stomach: Secondary | ICD-10-CM

## 2017-07-24 DIAGNOSIS — Z95828 Presence of other vascular implants and grafts: Secondary | ICD-10-CM

## 2017-07-24 DIAGNOSIS — D500 Iron deficiency anemia secondary to blood loss (chronic): Secondary | ICD-10-CM

## 2017-07-24 LAB — CBC WITH DIFFERENTIAL (CANCER CENTER ONLY)
BASOS PCT: 1 %
Basophils Absolute: 0 10*3/uL (ref 0.0–0.1)
EOS ABS: 0 10*3/uL (ref 0.0–0.5)
EOS PCT: 1 %
HCT: 33.8 % — ABNORMAL LOW (ref 38.4–49.9)
Hemoglobin: 11 g/dL — ABNORMAL LOW (ref 13.0–17.1)
Lymphocytes Relative: 28 %
Lymphs Abs: 2.1 10*3/uL (ref 0.9–3.3)
MCH: 26 pg — AB (ref 27.2–33.4)
MCHC: 32.5 g/dL (ref 32.0–36.0)
MCV: 80.1 fL (ref 79.3–98.0)
MONO ABS: 0.6 10*3/uL (ref 0.1–0.9)
MONOS PCT: 7 %
Neutro Abs: 4.8 10*3/uL (ref 1.5–6.5)
Neutrophils Relative %: 63 %
Platelet Count: 304 10*3/uL (ref 140–400)
RBC: 4.21 MIL/uL (ref 4.20–5.82)
RDW: 31.4 % — AB (ref 11.0–14.6)
WBC Count: 7.5 10*3/uL (ref 4.0–10.3)

## 2017-07-24 LAB — CMP (CANCER CENTER ONLY)
ALBUMIN: 3.5 g/dL (ref 3.5–5.0)
ALK PHOS: 46 U/L (ref 40–150)
ALT: 13 U/L (ref 0–55)
AST: 15 U/L (ref 5–34)
Anion gap: 6 (ref 3–11)
BUN: 11 mg/dL (ref 7–26)
CO2: 26 mmol/L (ref 22–29)
CREATININE: 0.78 mg/dL (ref 0.70–1.30)
Calcium: 9.1 mg/dL (ref 8.4–10.4)
Chloride: 106 mmol/L (ref 98–109)
GFR, Estimated: >60 mL/min (ref >60)
GLUCOSE: 93 mg/dL (ref 70–140)
Potassium: 4.2 mmol/L (ref 3.5–5.1)
SODIUM: 138 mmol/L (ref 136–145)
Total Bilirubin: 0.4 mg/dL (ref 0.2–1.2)
Total Protein: 6.7 g/dL (ref 6.4–8.3)

## 2017-07-24 MED ORDER — SODIUM CHLORIDE 0.9 % IV SOLN
2400.0000 mg/m2 | INTRAVENOUS | Status: DC
  Administered 2017-07-24: 4700 mg via INTRAVENOUS
  Filled 2017-07-24: qty 94

## 2017-07-24 MED ORDER — DEXTROSE 5 % IV SOLN
Freq: Once | INTRAVENOUS | Status: AC
  Administered 2017-07-24: 12:00:00 via INTRAVENOUS

## 2017-07-24 MED ORDER — OXALIPLATIN CHEMO INJECTION 100 MG/20ML
85.0000 mg/m2 | Freq: Once | INTRAVENOUS | Status: AC
  Administered 2017-07-24: 165 mg via INTRAVENOUS
  Filled 2017-07-24: qty 33

## 2017-07-24 MED ORDER — SODIUM CHLORIDE 0.9% FLUSH
10.0000 mL | INTRAVENOUS | Status: DC | PRN
  Filled 2017-07-24: qty 10

## 2017-07-24 MED ORDER — DEXAMETHASONE SODIUM PHOSPHATE 10 MG/ML IJ SOLN
INTRAMUSCULAR | Status: AC
  Filled 2017-07-24: qty 1

## 2017-07-24 MED ORDER — LEUCOVORIN CALCIUM INJECTION 350 MG
400.0000 mg/m2 | Freq: Once | INTRAVENOUS | Status: AC
  Administered 2017-07-24: 780 mg via INTRAVENOUS
  Filled 2017-07-24: qty 39

## 2017-07-24 MED ORDER — SODIUM CHLORIDE 0.9% FLUSH
10.0000 mL | INTRAVENOUS | Status: DC | PRN
  Administered 2017-07-24: 10 mL via INTRAVENOUS
  Filled 2017-07-24: qty 10

## 2017-07-24 MED ORDER — ALTEPLASE 2 MG IJ SOLR
2.0000 mg | Freq: Once | INTRAMUSCULAR | Status: DC | PRN
  Filled 2017-07-24: qty 2

## 2017-07-24 MED ORDER — FLUOROURACIL CHEMO INJECTION 2.5 GM/50ML
400.0000 mg/m2 | Freq: Once | INTRAVENOUS | Status: AC
  Administered 2017-07-24: 800 mg via INTRAVENOUS
  Filled 2017-07-24: qty 16

## 2017-07-24 MED ORDER — DEXAMETHASONE SODIUM PHOSPHATE 10 MG/ML IJ SOLN
10.0000 mg | Freq: Once | INTRAMUSCULAR | Status: AC
  Administered 2017-07-24: 10 mg via INTRAVENOUS

## 2017-07-24 MED ORDER — PALONOSETRON HCL INJECTION 0.25 MG/5ML
INTRAVENOUS | Status: AC
  Filled 2017-07-24: qty 5

## 2017-07-24 MED ORDER — PALONOSETRON HCL INJECTION 0.25 MG/5ML
0.2500 mg | Freq: Once | INTRAVENOUS | Status: AC
  Administered 2017-07-24: 0.25 mg via INTRAVENOUS

## 2017-07-24 MED ORDER — HEPARIN SOD (PORK) LOCK FLUSH 100 UNIT/ML IV SOLN
500.0000 [IU] | Freq: Once | INTRAVENOUS | Status: DC
  Filled 2017-07-24: qty 5

## 2017-07-24 NOTE — Patient Instructions (Signed)
[facility] Discharge Instructions for Patients Receiving Chemotherapy  Today you received the following chemotherapy agents: Oxaliplatin, Leucovorin, and 5FU.  To help prevent nausea and vomiting after your treatment, we encourage you to take your nausea medication as directed.   If you develop nausea and vomiting that is not controlled by your nausea medication, call the clinic.   BELOW ARE SYMPTOMS THAT SHOULD BE REPORTED IMMEDIATELY:  *FEVER GREATER THAN 100.5 F  *CHILLS WITH OR WITHOUT FEVER  NAUSEA AND VOMITING THAT IS NOT CONTROLLED WITH YOUR NAUSEA MEDICATION  *UNUSUAL SHORTNESS OF BREATH  *UNUSUAL BRUISING OR BLEEDING  TENDERNESS IN MOUTH AND THROAT WITH OR WITHOUT PRESENCE OF ULCERS  *URINARY PROBLEMS  *BOWEL PROBLEMS  UNUSUAL RASH Items with * indicate a potential emergency and should be followed up as soon as possible.  Feel free to call the clinic should you have any questions or concerns. The clinic phone number is [phone].  Please show the CHEMO ALERT CARD at check-in to the Emergency Department and triage nurse.    

## 2017-07-24 NOTE — Telephone Encounter (Signed)
Scheduled appt per 3/13 los - Gave patient AVS and calender per los.  

## 2017-07-24 NOTE — Progress Notes (Signed)
[facility]  [phone] [phone]  Clinic Follow up Note   Patient Care Team: [name], FNP as PCP - General (Family Medicine) 07/24/2017  SUMMARY OF ONCOLOGIC HISTORY: Oncology History   Cancer Staging Gastric cancer [facility]) Staging form: Stomach, AJCC 8th Edition - Clinical stage from 06/04/2017: Stage IVB (cT4, cN0, cM1) - Signed by [name], MD on 06/10/2017       Gastric cancer ([place])   06/03/2017 - 06/06/2017 Hospital Admission    Admitted to the hospital on 06/03/17 with complaints of fatigue and melena. During his stay he underwent an endoscopy that revealed a malignant gastric tumor in the cardia and in the gastric fundus. Biopsy results revealed adenocarcinoma. Pt was discharged on 06/06/17.      06/04/2017 Initial Biopsy    Diagnosis 06/04/17 Stomach, biopsy, Fundus - ADENOCARCINOMA - SEE COMMENT Microscopic Comment The stomach biopsy is of an adenocarcinoma arising in a background of intestinal metaplasia. A Warthin-Starry stain is performed to determine the possibility of the presence of Helicobacter pylori. The Warthin-Starry stain is negative for organisms morphologically consistent with Helicobacter pylori.      06/04/2017 Procedure    Esophagogastroduodenoscopy 06/04/17 by Dr. Benson Norway Impression: Normal esophagus. Malignant gastric tumor in the cardia and in the  gastric fundus. Biopsied. Normal examined duodenum.      06/05/2017 Imaging    CT CAP W Contrast 06/05/17 IMPRESSION: 9.1 cm fungating mass in the gastric cardia/posterior gastric fundus, corresponding to the patient's newly diagnosed gastric Cancer. Associated extra gastric extension with peritoneal disease in the left upper abdomen, including a dominant 4.2 cm peritoneal implant. Small volume pelvic ascites. 1.8 cm hypoenhancing lesion along the inferior spleen, indeterminate.      06/10/2017 Initial Diagnosis    Gastric cancer ([place])     CURRENT THERAPY:  first line chemo FOLFOX every 2 weeks, started on 06/26/2017  INTERVAL HISTORY: [name] returns for follow-up as scheduled prior to cycle 3 FOLFOX.  Status post cycle 2 and Feraheme on 2/27.  He developed acute onset severe LUQ pain with diaphoresis after IV Feraheme that day, EKG was negative and was taken to the ER for evaluation.  He was later escorted back to the infusion room and was able to complete chemo which he tolerated well. Notes his LUQ pain is improving overall, needing less pain medication.  Takes extra strength Tylenol occasionally.  He denies fatigue; eating and drinking well.  No fever or chills.  Has rarely has nausea, resolves with Zofran. Has only required anti-emetics a few times while on chemo.  Denies constipation, diarrhea, or blood in stool.  No cold sensitivity or neuropathy.  He is sleeping well.  Denies fever, chills, cough, dyspnea or swelling.  REVIEW OF SYSTEMS:   Constitutional: Denies fatigue, fevers, chills or abnormal weight loss Eyes: Denies blurriness of vision Ears, nose, mouth, throat, and face: Denies mucositis or sore throat Respiratory: Denies cough, dyspnea or wheezes Cardiovascular: Denies palpitation, chest discomfort or lower extremity swelling Gastrointestinal:  Denies vomiting, constipation, diarrhea, hematochezia, heartburn or change in bowel habits (+) rare nausea, resolved with Zofran (+) LUQ pain, improving Skin: Denies abnormal skin rashes Lymphatics: Denies new lymphadenopathy or easy bruising Neurological:Denies numbness, tingling, cold sensitivity, or new weaknesses Behavioral/Psych: Mood is stable, no new changes  All other systems were reviewed with the patient and are negative.  MEDICAL HISTORY:  Past Medical History:  Diagnosis Date  . DVT (deep venous thrombosis) ([place]) 2011  . GI bleeding 2001  .  History of DVT of lower extremity   . Melena 05/2017  . Stomach ulcer    Clinically suspected; No EGD confirmation as of 06/03/17     SURGICAL HISTORY: Past Surgical History:  Procedure Laterality Date  . ESOPHAGOGASTRODUODENOSCOPY  06/04/2017  . ESOPHAGOGASTRODUODENOSCOPY N/A 06/04/2017   Procedure: ESOPHAGOGASTRODUODENOSCOPY (EGD);  Surgeon: Carol Ada, MD;  Location: [facility];  Service: Endoscopy;  Laterality: N/A;  . IR FLUORO GUIDE PORT INSERTION RIGHT  06/20/2017  . IR US GUIDE VASC ACCESS RIGHT  06/20/2017    I have reviewed the social history and family history with the patient and they are unchanged from previous note.  ALLERGIES:  has No Known Allergies.  MEDICATIONS:  Current Outpatient Medications  Medication Sig Dispense Refill  . acetaminophen (TYLENOL) 500 MG tablet Take 1,000 mg by mouth every 6 (six) hours as needed for mild pain.     Marland Kitchen amLODipine (NORVASC) 5 MG tablet Take 1 tablet (5 mg total) by mouth daily. 30 tablet 5  . Blood Pressure Monitoring (BLOOD PRESSURE MONITOR/M CUFF) MISC 1 each by Does not apply route daily. 1 each 0  . Cyanocobalamin (VITAMIN B-12 PO) Take 1 tablet by mouth daily.    Marland Kitchen lidocaine-prilocaine (EMLA) cream Apply 1 application topically as needed (for port).    . ondansetron (ZOFRAN) 8 MG tablet Take 8 mg by mouth 2 (two) times daily as needed for refractory nausea / vomiting. Start on day 3 after chemo    . prochlorperazine (COMPAZINE) 10 MG tablet Take 10 mg by mouth every 6 (six) hours as needed for nausea or vomiting.    . thiamine 100 MG tablet Take 1 tablet (100 mg total) by mouth daily. 30 tablet 3  . pantoprazole (PROTONIX) 40 MG tablet Take 1 tablet (40 mg total) by mouth 2 (two) times daily. 60 tablet 3   No current facility-administered medications for this visit.     PHYSICAL EXAMINATION: ECOG PERFORMANCE STATUS: 1 - Symptomatic but completely ambulatory  Vitals:   07/24/17 1026  BP: 131/76  Pulse: 79  Resp: 18  Temp: 98.6 F (37 C)  SpO2: 100%   Filed Weights   07/24/17 1026  Weight: 164 lb 14.4 oz (74.8 kg)    GENERAL:alert, no  distress and comfortable SKIN: skin color, texture, turgor are normal, no rashes or significant lesions EYES: normal, Conjunctiva are pink and non-injected, sclera clear OROPHARYNX:no exudate, no erythema and lips, buccal mucosa, and tongue normal  LYMPH:  no palpable cervical, supraclavicular, or axillary lymphadenopathy LUNGS: clear to auscultation bilaterally with normal breathing effort HEART: regular rate & rhythm and no murmurs and no lower extremity edema ABDOMEN:abdomen soft, non-tender and normal bowel sounds.  No hepatosplenomegaly Musculoskeletal:no cyanosis of digits and no clubbing  NEURO: alert & oriented x 3 with fluent speech, no focal motor/sensory deficits PAC without erythema   LABORATORY DATA:  I have reviewed the data as listed CBC Latest Ref Rng & Units 07/24/2017 07/10/2017 07/04/2017  WBC 4.0 - 10.3 K/uL 7.5 9.6 10.0  Hemoglobin 13.0 - 17.0 g/dL - - -  Hematocrit 38.4 - 49.9 % 33.8(L) 31.7(L) 34.4(L)  Platelets 140 - 400 K/uL 304 327 450(H)     CMP Latest Ref Rng & Units 07/24/2017 07/10/2017 07/04/2017  Glucose 70 - 140 mg/dL 93 89 62(L)  BUN 7 - 26 mg/dL _0 Creatinine 0.70 - 1.30 mg/dL 0.78 0.83 0.82  Sodium 136 - 145 mmol/L 138 137 134(L)  Potassium 3.5 - 5.1 mmol/L  4.2 4.3 4.5  Chloride 98 - 109 mmol/L 106 104 100  CO2 22 - 29 mmol/L _0 Calcium 8.4 - 10.4 mg/dL 9.1 9.2 9.4  Total Protein 6.4 - 8.3 g/dL 6.7 6.6 6.9  Total Bilirubin 0.2 - 1.2 mg/dL 0.4 0.2 0.2  Alkaline Phos 40 - 150 U/L 46 49 59  AST 5 - 34 U/L _1 ALT 0 - 55 U/L _2 PATHOLOGY  Diagnosis 06/04/17 Stomach, biopsy, Fundus - ADENOCARCINOMA - SEE COMMENT Microscopic Comment The stomach biopsy is of an adenocarcinoma arising in a background of intestinal metaplasia. A Warthin-Starry stain is performed to determine the possibility of the presence of Helicobacter pylori. The Warthin-Starry stain is negative for organisms morphologically consistent with  Helicobacter pylori.      RADIOGRAPHIC STUDIES: I have personally reviewed the radiological images as listed and agreed with the findings in the report. No results found.   ASSESSMENT & PLAN: Ryan Collins 57 y.o. male, without significant past medical history, but also does not  see doctors routinely, presented with worsening fatigue, intermittent epigastric pain and melena for 3-4 months, and 50 pounds weight loss over the past 6 months.  1.  Gastric cancer, [id number] with peritoneal metastasis, adenocarcinoma, HER2(-), MSI-stable, PD-L1 positive (5) 2.  Anemia of iron deficiency and tumor bleeding, B12 deficiency 3.  History of DVT, protein C deficiency 4.  History of alcohol and marijuana abuse 5.  Weight loss and malnutrition 6.  Goal of care discussion - full code  Mr. Shi appears stable today.  He completed 2 cycles first-line FOLFOX. He tolerated very well.  We reviewed foundation one results, tumor is MSI stable, weakly positive for PDL 1.  Reviewed the results with Dr. Burr Medico.  We will continue with current treatment plan FOLFOX; PDL 1 inhibitor likely to be offered in the future such as third-line therapy.  Vital signs and weight stable, labs reviewed.  CMP unremarkable.  Anemia is stable, Hgb 11.0.  Status post 3 doses IV Feraheme; iron studies improved. He developed severe LUQ pain and diaphoresis after IV Feraheme on 2/27, work up was negative; not likely infusion reaction. Labs adequate to proceed with cycle 3 FOLFOX today and continue every 2 weeks.  Plan to obtain restaging scan after 4-5 cycles.  Follow-up in 2 weeks with cycle 4.    Plan -Labs reviewed, proceed with cycle 3 FOLFOX, continue q2 weeks -F/u in 2 weeks with cycle 4, order restaging CT at next visit  All questions were answered. The patient knows to call the clinic with any problems, questions or concerns. No barriers to learning was detected. I spent 15 minutes counseling the patient face to face. The total  time spent in the appointment was 20 minutes and more than 50% was on counseling and review of test results.     Alla Feeling, NP 07/24/17

## 2017-07-26 ENCOUNTER — Inpatient Hospital Stay: Payer: BLUE CROSS/BLUE SHIELD

## 2017-07-26 DIAGNOSIS — C161 Malignant neoplasm of fundus of stomach: Secondary | ICD-10-CM | POA: Diagnosis not present

## 2017-07-26 MED ORDER — SODIUM CHLORIDE 0.9% FLUSH
10.0000 mL | INTRAVENOUS | Status: DC | PRN
  Administered 2017-07-26: 10 mL
  Filled 2017-07-26: qty 10

## 2017-07-26 MED ORDER — HEPARIN SOD (PORK) LOCK FLUSH 100 UNIT/ML IV SOLN
500.0000 [IU] | Freq: Once | INTRAVENOUS | Status: AC | PRN
  Administered 2017-07-26: 500 [IU]
  Filled 2017-07-26: qty 5

## 2017-07-26 NOTE — Patient Instructions (Signed)

## 2017-08-06 NOTE — Progress Notes (Signed)
Ryan Collins  [phone]) [phone] Fax:(336) [phone]  Clinic Follow up Note   Patient Care Team: [name], FNP as PCP - General (Family Medicine) 08/07/2017  SUMMARY OF ONCOLOGIC HISTORY: Oncology History   Cancer Staging Gastric cancer [facility]) Staging form: Stomach, AJCC 8th Edition - Clinical stage from 06/04/2017: Stage IVB (cT4, cN0, cM1) - Signed by [name], MD on 06/10/2017       Gastric cancer ([place])   06/03/2017 - 06/06/2017 Hospital Admission    Admitted to the hospital on 06/03/17 with complaints of fatigue and melena. During his stay he underwent an endoscopy that revealed a malignant gastric tumor in the cardia and in the gastric fundus. Biopsy results revealed adenocarcinoma. Pt was discharged on 06/06/17.      06/04/2017 Initial Biopsy    Diagnosis 06/04/17 Stomach, biopsy, Fundus - ADENOCARCINOMA - SEE COMMENT Microscopic Comment The stomach biopsy is of an adenocarcinoma arising in a background of intestinal metaplasia. A Warthin-Starry stain is performed to determine the possibility of the presence of Helicobacter pylori. The Warthin-Starry stain is negative for organisms morphologically consistent with Helicobacter pylori.      06/04/2017 Procedure    Esophagogastroduodenoscopy 06/04/17 by Dr. Benson Norway Impression: Normal esophagus. Malignant gastric tumor in the cardia and in the  gastric fundus. Biopsied. Normal examined duodenum.      06/05/2017 Imaging    CT CAP W Contrast 06/05/17 IMPRESSION: 9.1 cm fungating mass in the gastric cardia/posterior gastric fundus, corresponding to the patient's newly diagnosed gastric Cancer. Associated extra gastric extension with peritoneal disease in the left upper abdomen, including a dominant 4.2 cm peritoneal implant. Small volume pelvic ascites. 1.8 cm hypoenhancing lesion along the inferior spleen, indeterminate.      06/10/2017 Initial Diagnosis    Gastric cancer ([place])     CURRENT  THERAPY:first line chemo FOLFOX every 2 weeks, started on 06/26/2017  INTERVAL HISTORY: Ryan Collins is here today for evaluation prior to receiving chemotherapy with Folfox.  He is tolerating treatment well.  He is eating and drinking well.  His weight is stable.  He denies peripheral neuropathy.  He has occasional fatigue but is able to do things that he wants to do.  Otherwise he is doing well and is without questions or concerns.    REVIEW OF SYSTEMS:   Review of Systems  Constitutional: Negative for appetite change, chills, fatigue, fever and unexpected weight change.  HENT:   Negative for hearing loss, lump/mass, mouth sores and trouble swallowing.   Eyes: Negative for eye problems and icterus.  Respiratory: Negative for chest tightness, cough and shortness of breath.   Cardiovascular: Negative for chest pain, leg swelling and palpitations.  Gastrointestinal: Negative for abdominal distention, abdominal pain, constipation, diarrhea, nausea and vomiting.  Endocrine: Negative for hot flashes.  Genitourinary: Negative for difficulty urinating.   Skin: Negative for itching and rash.  Neurological: Negative for dizziness, extremity weakness, headaches and numbness.  Hematological: Negative for adenopathy. Does not bruise/bleed easily.  Psychiatric/Behavioral: Negative for depression. The patient is not nervous/anxious.      MEDICAL HISTORY:  Past Medical History:  Diagnosis Date  . DVT (deep venous thrombosis) ([place]) 2011  . GI bleeding 2001  . History of DVT of lower extremity   . Melena 05/2017  . Stomach ulcer    Clinically suspected; No EGD confirmation as of 06/03/17    SURGICAL HISTORY: Past Surgical History:  Procedure Laterality Date  . ESOPHAGOGASTRODUODENOSCOPY  06/04/2017  . ESOPHAGOGASTRODUODENOSCOPY N/A 06/04/2017   Procedure:  ESOPHAGOGASTRODUODENOSCOPY (EGD);  Surgeon: Carol Ada, MD;  Location: [facility];  Service: Endoscopy;  Laterality: N/A;  . IR FLUORO GUIDE  PORT INSERTION RIGHT  06/20/2017  . IR US GUIDE VASC ACCESS RIGHT  06/20/2017    I have reviewed the social history and family history with the patient and they are unchanged from previous note.  ALLERGIES:  has No Known Allergies.  MEDICATIONS:  Current Outpatient Medications  Medication Sig Dispense Refill  . acetaminophen (TYLENOL) 500 MG tablet Take 1,000 mg by mouth every 6 (six) hours as needed for mild pain.     Marland Kitchen amLODipine (NORVASC) 5 MG tablet Take 1 tablet (5 mg total) by mouth daily. 30 tablet 5  . Blood Pressure Monitoring (BLOOD PRESSURE MONITOR/M CUFF) MISC 1 each by Does not apply route daily. 1 each 0  . Cyanocobalamin (VITAMIN B-12 PO) Take 1 tablet by mouth daily.    Marland Kitchen lidocaine-prilocaine (EMLA) cream Apply 1 application topically as needed (for port).    . ondansetron (ZOFRAN) 8 MG tablet Take 8 mg by mouth 2 (two) times daily as needed for refractory nausea / vomiting. Start on day 3 after chemo    . pantoprazole (PROTONIX) 40 MG tablet Take 1 tablet (40 mg total) by mouth 2 (two) times daily. 60 tablet 3  . prochlorperazine (COMPAZINE) 10 MG tablet Take 10 mg by mouth every 6 (six) hours as needed for nausea or vomiting.    . thiamine 100 MG tablet Take 1 tablet (100 mg total) by mouth daily. 30 tablet 3   No current facility-administered medications for this visit.    Facility-Administered Medications Ordered in Other Visits  Medication Dose Route Frequency Provider Last Rate Last Dose  . Cold Pack 1 packet  1 packet Topical Once PRN [name], MD      . dexamethasone (DECADRON) injection 10 mg  10 mg Intravenous Once [name], MD      . dextrose 5 % solution   Intravenous Once [name], MD      . fluorouracil (ADRUCIL) 4,700 mg in sodium chloride 0.9 % 56 mL chemo infusion  2,400 mg/m2 (Treatment Plan Recorded) Intravenous 1 day or 1 dose [name], MD      . fluorouracil (ADRUCIL) chemo injection 800 mg  400 mg/m2 (Treatment Plan Recorded) Intravenous Once [name], MD      . Hot Pack 1 packet  1 packet Topical Once PRN [name], MD      . leucovorin 780 mg in dextrose 5 % 250 mL infusion  400 mg/m2 (Treatment Plan Recorded) Intravenous Once [name], MD      . oxaliplatin (ELOXATIN) 165 mg in dextrose 5 % 500 mL chemo infusion  85 mg/m2 (Treatment Plan Recorded) Intravenous Once [name], MD      . palonosetron (ALOXI) injection 0.25 mg  0.25 mg Intravenous Once [name], MD        PHYSICAL EXAMINATION: ECOG PERFORMANCE STATUS: 1 - Symptomatic but completely ambulatory  Vitals:   08/07/17 0842  BP: 132/85  Pulse: 73  Resp: 18  Temp: 98.2 F (36.8 C)  SpO2: 100%   Filed Weights   08/07/17 0842  Weight: 168 lb 8 oz (76.4 kg)  GENERAL: Patient is a well appearing male in no acute distress HEENT:  Sclerae anicteric.  Oropharynx clear and moist. No ulcerations or evidence of oropharyngeal candidiasis. Neck is supple.  NODES:  No cervical, supraclavicular, or axillary lymphadenopathy palpated.  LUNGS:  Clear  to auscultation bilaterally.  No wheezes or rhonchi. HEART:  Regular rate and rhythm. No murmur appreciated. ABDOMEN:  Soft, nontender.  Positive, normoactive bowel sounds. No organomegaly palpated. MSK:  No focal spinal tenderness to palpation. Full range of motion noted. EXTREMITIES:  No peripheral edema.   SKIN:  Clear with no obvious rashes or skin changes. No nail dyscrasia. NEURO:  Nonfocal. Well oriented.  Appropriate affect.    LABORATORY DATA:  I have reviewed the data as listed CBC Latest Ref Rng & Units 08/07/2017 07/24/2017 07/10/2017  WBC 4.0 - 10.3 K/uL 7.0 7.5 9.6  Hemoglobin 13.0 - 17.0 g/dL - - -  Hematocrit 38.4 - 49.9 % 33.9(L) 33.8(L) 31.7(L)  Platelets 140 - 400 K/uL 262 304 327     CMP Latest Ref Rng & Units 08/07/2017 07/24/2017 07/10/2017  Glucose 70 - 140 mg/dL 94 93 89  BUN 7 - 26 mg/dL 10 11 13   Creatinine 0.70 - 1.30 mg/dL 0.78 0.78 0.83  Sodium 136 - 145 mmol/L 140 138 137  Potassium 3.5 - 5.1  mmol/L 4.0 4.2 4.3  Chloride 98 - 109 mmol/L 110(H) 106 104  CO2 22 - 29 mmol/L 24 26 25   Calcium 8.4 - 10.4 mg/dL 9.2 9.1 9.2  Total Protein 6.4 - 8.3 g/dL 6.7 6.7 6.6  Total Bilirubin 0.2 - 1.2 mg/dL 0.4 0.4 0.2  Alkaline Phos 40 - 150 U/L 53 46 49  AST 5 - 34 U/L 12 15 14   ALT 0 - 55 U/L 10 13 17    PATHOLOGY  Diagnosis 06/04/17 Stomach, biopsy, Fundus - ADENOCARCINOMA - SEE COMMENT Microscopic Comment The stomach biopsy is of an adenocarcinoma arising in a background of intestinal metaplasia. A Warthin-Starry stain is performed to determine the possibility of the presence of Helicobacter pylori. The Warthin-Starry stain is negative for organisms morphologically consistent with Helicobacter pylori.         ASSESSMENT & PLAN:   Gastric cancer ([place]) Lianne Cure y.o.male, without significant past medical history, but also does not see doctors routinely, presented with worsening fatigue, intermittent epigastric pain and melena for 3-4 months, and 50 pounds weight loss over the past 6 months.  1. Gastric cancer, T4bNxM1 with peritoneal metastasis, adenocarcinoma, HER2(-), MSI-stable, PD-L1 positive (5): Erdem is tolerating chemotherapy well.  He will proceed with treatment today with FOLFOX.    2.  Anemia of iron deficiency and tumor bleeding, B12 deficiency: I reviewed his labs with him in detail.  His CBC is stable.  3.  History of DVT, protein C deficiency: Noted, no swelling today or sign of DVT.  4.  History of alcohol and marijuana abuse.  5.  Weight loss and malnutrition: His weight is stable today and he will see nutrition today while receiving treatment.    6.  Goal of care discussion: Full Code  Jamesmichael will return in 2 weeks for labs, f/u and his next cycle of FOLFOX.     All questions were answered. The patient knows to call the clinic with any problems, questions or concerns. No barriers to learning were detected.  A total of (20) minutes of  face-to-face time was spent with this patient with greater than 50% of that time in counseling and care-coordination.     Scot Dock, NP 08/07/17

## 2017-08-07 ENCOUNTER — Inpatient Hospital Stay (HOSPITAL_BASED_OUTPATIENT_CLINIC_OR_DEPARTMENT_OTHER): Payer: BLUE CROSS/BLUE SHIELD | Admitting: Adult Health

## 2017-08-07 ENCOUNTER — Inpatient Hospital Stay: Payer: BLUE CROSS/BLUE SHIELD

## 2017-08-07 ENCOUNTER — Encounter: Payer: Self-pay | Admitting: Adult Health

## 2017-08-07 ENCOUNTER — Inpatient Hospital Stay: Payer: BLUE CROSS/BLUE SHIELD | Admitting: Nutrition

## 2017-08-07 VITALS — BP 132/85 | HR 73 | Temp 98.2°F | Resp 18 | Ht 71.5 in | Wt 168.5 lb

## 2017-08-07 DIAGNOSIS — Z86718 Personal history of other venous thrombosis and embolism: Secondary | ICD-10-CM

## 2017-08-07 DIAGNOSIS — C161 Malignant neoplasm of fundus of stomach: Secondary | ICD-10-CM

## 2017-08-07 DIAGNOSIS — D500 Iron deficiency anemia secondary to blood loss (chronic): Secondary | ICD-10-CM

## 2017-08-07 DIAGNOSIS — E538 Deficiency of other specified B group vitamins: Secondary | ICD-10-CM

## 2017-08-07 DIAGNOSIS — Z79899 Other long term (current) drug therapy: Secondary | ICD-10-CM

## 2017-08-07 DIAGNOSIS — C786 Secondary malignant neoplasm of retroperitoneum and peritoneum: Secondary | ICD-10-CM

## 2017-08-07 DIAGNOSIS — D509 Iron deficiency anemia, unspecified: Secondary | ICD-10-CM | POA: Diagnosis not present

## 2017-08-07 DIAGNOSIS — D6859 Other primary thrombophilia: Secondary | ICD-10-CM

## 2017-08-07 DIAGNOSIS — R1012 Left upper quadrant pain: Secondary | ICD-10-CM | POA: Diagnosis not present

## 2017-08-07 LAB — CMP (CANCER CENTER ONLY)
ALBUMIN: 3.5 g/dL (ref 3.5–5.0)
ALT: 10 U/L (ref 0–55)
ANION GAP: 6 (ref 3–11)
AST: 12 U/L (ref 5–34)
Alkaline Phosphatase: 53 U/L (ref 40–150)
BUN: 10 mg/dL (ref 7–26)
CO2: 24 mmol/L (ref 22–29)
Calcium: 9.2 mg/dL (ref 8.4–10.4)
Chloride: 110 mmol/L — ABNORMAL HIGH (ref 98–109)
Creatinine: 0.78 mg/dL (ref 0.70–1.30)
GFR, Est AFR Am: >60 mL/min (ref >60)
GFR, Estimated: >60 mL/min (ref >60)
GLUCOSE: 94 mg/dL (ref 70–140)
POTASSIUM: 4 mmol/L (ref 3.5–5.1)
Sodium: 140 mmol/L (ref 136–145)
TOTAL PROTEIN: 6.7 g/dL (ref 6.4–8.3)
Total Bilirubin: 0.4 mg/dL (ref 0.2–1.2)

## 2017-08-07 LAB — CBC WITH DIFFERENTIAL (CANCER CENTER ONLY)
BASOS ABS: 0 10*3/uL (ref 0.0–0.1)
Basophils Relative: 0 %
Eosinophils Absolute: 0.1 10*3/uL (ref 0.0–0.5)
Eosinophils Relative: 1 %
HEMATOCRIT: 33.9 % — AB (ref 38.4–49.9)
Hemoglobin: 10.9 g/dL — ABNORMAL LOW (ref 13.0–17.1)
LYMPHS PCT: 24 %
Lymphs Abs: 1.7 10*3/uL (ref 0.9–3.3)
MCH: 26.4 pg — ABNORMAL LOW (ref 27.2–33.4)
MCHC: 32.3 g/dL (ref 32.0–36.0)
MCV: 81.7 fL (ref 79.3–98.0)
MONO ABS: 0.8 10*3/uL (ref 0.1–0.9)
MONOS PCT: 12 %
NEUTROS ABS: 4.4 10*3/uL (ref 1.5–6.5)
NEUTROS PCT: 63 %
Platelet Count: 262 10*3/uL (ref 140–400)
RBC: 4.15 MIL/uL — ABNORMAL LOW (ref 4.20–5.82)
RDW: 30.4 % — AB (ref 11.0–14.6)
WBC Count: 7 10*3/uL (ref 4.0–10.3)

## 2017-08-07 LAB — FERRITIN: Ferritin: 279 ng/mL (ref 22–316)

## 2017-08-07 LAB — IRON AND TIBC
IRON: 52 ug/dL (ref 42–163)
Saturation Ratios: 25 % — ABNORMAL LOW (ref 42–163)
TIBC: 213 ug/dL (ref 202–409)
UIBC: 160 ug/dL

## 2017-08-07 LAB — CEA (IN HOUSE-CHCC): CEA (CHCC-In House): 31.11 ng/mL — ABNORMAL HIGH (ref 0.00–5.00)

## 2017-08-07 MED ORDER — SODIUM CHLORIDE 0.9 % IV SOLN
2400.0000 mg/m2 | INTRAVENOUS | Status: DC
  Administered 2017-08-07: 4700 mg via INTRAVENOUS
  Filled 2017-08-07: qty 94

## 2017-08-07 MED ORDER — DEXTROSE 5 % IV SOLN
Freq: Once | INTRAVENOUS | Status: AC
  Administered 2017-08-07: 10:00:00 via INTRAVENOUS

## 2017-08-07 MED ORDER — HOT PACK MISC ONCOLOGY
1.0000 | Freq: Once | Status: DC | PRN
  Filled 2017-08-07: qty 1

## 2017-08-07 MED ORDER — DEXAMETHASONE SODIUM PHOSPHATE 10 MG/ML IJ SOLN
INTRAMUSCULAR | Status: AC
  Filled 2017-08-07: qty 1

## 2017-08-07 MED ORDER — PALONOSETRON HCL INJECTION 0.25 MG/5ML
0.2500 mg | Freq: Once | INTRAVENOUS | Status: AC
  Administered 2017-08-07: 0.25 mg via INTRAVENOUS

## 2017-08-07 MED ORDER — COLD PACK MISC ONCOLOGY
1.0000 | Freq: Once | Status: DC | PRN
  Filled 2017-08-07: qty 1

## 2017-08-07 MED ORDER — DEXTROSE 5 % IV SOLN
400.0000 mg/m2 | Freq: Once | INTRAVENOUS | Status: AC
  Administered 2017-08-07: 780 mg via INTRAVENOUS
  Filled 2017-08-07: qty 39

## 2017-08-07 MED ORDER — DEXAMETHASONE SODIUM PHOSPHATE 10 MG/ML IJ SOLN
10.0000 mg | Freq: Once | INTRAMUSCULAR | Status: AC
  Administered 2017-08-07: 10 mg via INTRAVENOUS

## 2017-08-07 MED ORDER — FLUOROURACIL CHEMO INJECTION 2.5 GM/50ML
400.0000 mg/m2 | Freq: Once | INTRAVENOUS | Status: AC
  Administered 2017-08-07: 800 mg via INTRAVENOUS
  Filled 2017-08-07: qty 16

## 2017-08-07 MED ORDER — PALONOSETRON HCL INJECTION 0.25 MG/5ML
INTRAVENOUS | Status: AC
Start: 2017-08-07 — End: 2017-08-07
  Filled 2017-08-07: qty 5

## 2017-08-07 MED ORDER — SODIUM CHLORIDE 0.9% FLUSH
3.0000 mL | Freq: Once | INTRAVENOUS | Status: AC | PRN
  Administered 2017-08-07: 3 mL via INTRAVENOUS
  Filled 2017-08-07: qty 10

## 2017-08-07 MED ORDER — OXALIPLATIN CHEMO INJECTION 100 MG/20ML
85.0000 mg/m2 | Freq: Once | INTRAVENOUS | Status: AC
  Administered 2017-08-07: 165 mg via INTRAVENOUS
  Filled 2017-08-07: qty 33

## 2017-08-07 NOTE — Assessment & Plan Note (Addendum)
Ryan Collins y.o.male, without significant past medical history, but also does not see doctors routinely, presented with worsening fatigue, intermittent epigastric pain and melena for 3-4 months, and 50 pounds weight loss over the past 6 months.  1. Gastric cancer, T4bNxM1 with peritoneal metastasis, adenocarcinoma, HER2(-), MSI-stable, PD-L1 positive (5): Ryan Collins is tolerating chemotherapy well.  He will proceed with treatment today with FOLFOX.    2.  Anemia of iron deficiency and tumor bleeding, B12 deficiency: I reviewed his labs with him in detail.  His CBC is stable.  3.  History of DVT, protein C deficiency: Noted, no swelling today or sign of DVT.  4.  History of alcohol and marijuana abuse.  5.  Weight loss and malnutrition: His weight is stable today and he will see nutrition today while receiving treatment.    6.  Goal of care discussion: Full Code  Ryan Collins will return in 2 weeks for labs, f/u and his next cycle of FOLFOX.

## 2017-08-07 NOTE — Patient Instructions (Signed)
Cancer Center Discharge Instructions for Patients Receiving Chemotherapy  Today you received the following chemotherapy agents Oxaliplatin,leucovorin, Adrucil To help prevent nausea and vomiting after your treatment, we encourage you to take your nausea medication as directed  If you develop nausea and vomiting that is not controlled by your nausea medication, call the clinic.   BELOW ARE SYMPTOMS THAT SHOULD BE REPORTED IMMEDIATELY:  *FEVER GREATER THAN 100.5 F  *CHILLS WITH OR WITHOUT FEVER  NAUSEA AND VOMITING THAT IS NOT CONTROLLED WITH YOUR NAUSEA MEDICATION  *UNUSUAL SHORTNESS OF BREATH  *UNUSUAL BRUISING OR BLEEDING  TENDERNESS IN MOUTH AND THROAT WITH OR WITHOUT PRESENCE OF ULCERS  *URINARY PROBLEMS  *BOWEL PROBLEMS  UNUSUAL RASH Items with * indicate a potential emergency and should be followed up as soon as possible.  Feel free to call the clinic should you have any questions or concerns. The clinic phone number is [phone].  Please show the CHEMO ALERT CARD at check-in to the Emergency Department and triage nurse.   

## 2017-08-07 NOTE — Progress Notes (Signed)
Brief nutrition follow up completed with patient during infusion for gastric cancer. Weight improved and documented as 168.5 lb, improved from 153.2 lb on February 13. Patient denies nutrition impact symptoms. States he is eating well and has a good appetite. He enjoys Ensure but hasn't been drinking it much.  Nutrition Diagnosis: Food and Nutrition related knowledge deficit improved.  Intervention: Educated patient to continue strategies for increased calories and protein to maintain weight. Provided coupons for Ensure. Questions answered and teach back method used.  Monitoring, Evaluation, Goals: Patient will continue to consume adequate calories and protein to maintain weight or gain weight.  Next Visit: To be scheduled as needed.

## 2017-08-09 ENCOUNTER — Inpatient Hospital Stay: Payer: BLUE CROSS/BLUE SHIELD

## 2017-08-09 VITALS — BP 138/75 | HR 68 | Temp 98.2°F | Resp 18

## 2017-08-09 DIAGNOSIS — C161 Malignant neoplasm of fundus of stomach: Secondary | ICD-10-CM

## 2017-08-09 MED ORDER — SODIUM CHLORIDE 0.9% FLUSH
10.0000 mL | INTRAVENOUS | Status: DC | PRN
  Administered 2017-08-09: 10 mL
  Filled 2017-08-09: qty 10

## 2017-08-09 MED ORDER — HEPARIN SOD (PORK) LOCK FLUSH 100 UNIT/ML IV SOLN
500.0000 [IU] | Freq: Once | INTRAVENOUS | Status: AC | PRN
  Administered 2017-08-09: 500 [IU]
  Filled 2017-08-09: qty 5

## 2017-08-21 ENCOUNTER — Encounter: Payer: Self-pay | Admitting: Nurse Practitioner

## 2017-08-21 ENCOUNTER — Inpatient Hospital Stay: Payer: BLUE CROSS/BLUE SHIELD | Attending: Hematology

## 2017-08-21 ENCOUNTER — Inpatient Hospital Stay (HOSPITAL_BASED_OUTPATIENT_CLINIC_OR_DEPARTMENT_OTHER): Payer: BLUE CROSS/BLUE SHIELD | Admitting: Nurse Practitioner

## 2017-08-21 ENCOUNTER — Telehealth: Payer: Self-pay | Admitting: Nurse Practitioner

## 2017-08-21 ENCOUNTER — Inpatient Hospital Stay: Payer: BLUE CROSS/BLUE SHIELD

## 2017-08-21 VITALS — BP 124/76 | HR 80 | Temp 98.3°F | Resp 18 | Ht 71.5 in | Wt 176.4 lb

## 2017-08-21 DIAGNOSIS — D500 Iron deficiency anemia secondary to blood loss (chronic): Secondary | ICD-10-CM

## 2017-08-21 DIAGNOSIS — R1013 Epigastric pain: Secondary | ICD-10-CM | POA: Diagnosis not present

## 2017-08-21 DIAGNOSIS — D6859 Other primary thrombophilia: Secondary | ICD-10-CM | POA: Diagnosis not present

## 2017-08-21 DIAGNOSIS — C161 Malignant neoplasm of fundus of stomach: Secondary | ICD-10-CM

## 2017-08-21 DIAGNOSIS — G893 Neoplasm related pain (acute) (chronic): Secondary | ICD-10-CM | POA: Diagnosis not present

## 2017-08-21 DIAGNOSIS — C786 Secondary malignant neoplasm of retroperitoneum and peritoneum: Secondary | ICD-10-CM | POA: Diagnosis not present

## 2017-08-21 DIAGNOSIS — R5383 Other fatigue: Secondary | ICD-10-CM | POA: Diagnosis not present

## 2017-08-21 DIAGNOSIS — R110 Nausea: Secondary | ICD-10-CM | POA: Diagnosis not present

## 2017-08-21 DIAGNOSIS — Z79899 Other long term (current) drug therapy: Secondary | ICD-10-CM | POA: Diagnosis not present

## 2017-08-21 DIAGNOSIS — Z86718 Personal history of other venous thrombosis and embolism: Secondary | ICD-10-CM | POA: Insufficient documentation

## 2017-08-21 DIAGNOSIS — E538 Deficiency of other specified B group vitamins: Secondary | ICD-10-CM | POA: Diagnosis not present

## 2017-08-21 DIAGNOSIS — E46 Unspecified protein-calorie malnutrition: Secondary | ICD-10-CM | POA: Insufficient documentation

## 2017-08-21 DIAGNOSIS — Z5111 Encounter for antineoplastic chemotherapy: Secondary | ICD-10-CM | POA: Insufficient documentation

## 2017-08-21 DIAGNOSIS — Z95828 Presence of other vascular implants and grafts: Secondary | ICD-10-CM

## 2017-08-21 LAB — CMP (CANCER CENTER ONLY)
ALK PHOS: 54 U/L (ref 40–150)
ALT: 13 U/L (ref 0–55)
ANION GAP: 6 (ref 3–11)
AST: 14 U/L (ref 5–34)
Albumin: 3.5 g/dL (ref 3.5–5.0)
BUN: 11 mg/dL (ref 7–26)
CALCIUM: 9.1 mg/dL (ref 8.4–10.4)
CO2: 24 mmol/L (ref 22–29)
CREATININE: 0.81 mg/dL (ref 0.70–1.30)
Chloride: 109 mmol/L (ref 98–109)
Glucose, Bld: 92 mg/dL (ref 70–140)
Potassium: 4.1 mmol/L (ref 3.5–5.1)
SODIUM: 139 mmol/L (ref 136–145)
TOTAL PROTEIN: 6.7 g/dL (ref 6.4–8.3)
Total Bilirubin: 0.3 mg/dL (ref 0.2–1.2)

## 2017-08-21 LAB — CBC WITH DIFFERENTIAL (CANCER CENTER ONLY)
BASOS PCT: 0 %
Basophils Absolute: 0 10*3/uL (ref 0.0–0.1)
EOS ABS: 0.2 10*3/uL (ref 0.0–0.5)
Eosinophils Relative: 3 %
HEMATOCRIT: 34.5 % — AB (ref 38.4–49.9)
HEMOGLOBIN: 11.2 g/dL — AB (ref 13.0–17.1)
Lymphocytes Relative: 24 %
Lymphs Abs: 2 10*3/uL (ref 0.9–3.3)
MCH: 27 pg — ABNORMAL LOW (ref 27.2–33.4)
MCHC: 32.5 g/dL (ref 32.0–36.0)
MCV: 83.1 fL (ref 79.3–98.0)
MONOS PCT: 13 %
Monocytes Absolute: 1.1 10*3/uL — ABNORMAL HIGH (ref 0.1–0.9)
NEUTROS ABS: 5.2 10*3/uL (ref 1.5–6.5)
NEUTROS PCT: 60 %
Platelet Count: 246 10*3/uL (ref 140–400)
RBC: 4.15 MIL/uL — AB (ref 4.20–5.82)
RDW: 26.2 % — ABNORMAL HIGH (ref 11.0–14.6)
WBC: 8.5 10*3/uL (ref 4.0–10.3)

## 2017-08-21 MED ORDER — VITAMIN B-12 1000 MCG PO TABS: 1000.0000 ug | ORAL_TABLET | Freq: Every day | ORAL | 0 refills | Status: DC

## 2017-08-21 MED ORDER — OXALIPLATIN CHEMO INJECTION 100 MG/20ML
85.0000 mg/m2 | Freq: Once | INTRAVENOUS | Status: AC
  Administered 2017-08-21: 165 mg via INTRAVENOUS
  Filled 2017-08-21: qty 33

## 2017-08-21 MED ORDER — SODIUM CHLORIDE 0.9% FLUSH
10.0000 mL | INTRAVENOUS | Status: DC | PRN
  Administered 2017-08-21: 10 mL via INTRAVENOUS
  Filled 2017-08-21: qty 10

## 2017-08-21 MED ORDER — PALONOSETRON HCL INJECTION 0.25 MG/5ML
0.2500 mg | Freq: Once | INTRAVENOUS | Status: AC
  Administered 2017-08-21: 0.25 mg via INTRAVENOUS

## 2017-08-21 MED ORDER — DEXAMETHASONE SODIUM PHOSPHATE 10 MG/ML IJ SOLN
INTRAMUSCULAR | Status: AC
  Filled 2017-08-21: qty 1

## 2017-08-21 MED ORDER — LEUCOVORIN CALCIUM INJECTION 350 MG
400.0000 mg/m2 | Freq: Once | INTRAVENOUS | Status: AC
  Administered 2017-08-21: 780 mg via INTRAVENOUS
  Filled 2017-08-21: qty 39

## 2017-08-21 MED ORDER — PALONOSETRON HCL INJECTION 0.25 MG/5ML
INTRAVENOUS | Status: AC
  Filled 2017-08-21: qty 5

## 2017-08-21 MED ORDER — FLUOROURACIL CHEMO INJECTION 2.5 GM/50ML
400.0000 mg/m2 | Freq: Once | INTRAVENOUS | Status: AC
  Administered 2017-08-21: 800 mg via INTRAVENOUS
  Filled 2017-08-21: qty 16

## 2017-08-21 MED ORDER — DEXTROSE 5 % IV SOLN
Freq: Once | INTRAVENOUS | Status: AC
  Administered 2017-08-21: 10:00:00 via INTRAVENOUS

## 2017-08-21 MED ORDER — SODIUM CHLORIDE 0.9 % IV SOLN
2400.0000 mg/m2 | INTRAVENOUS | Status: DC
  Administered 2017-08-21: 4700 mg via INTRAVENOUS
  Filled 2017-08-21: qty 94

## 2017-08-21 MED ORDER — DEXAMETHASONE SODIUM PHOSPHATE 10 MG/ML IJ SOLN
10.0000 mg | Freq: Once | INTRAMUSCULAR | Status: AC
  Administered 2017-08-21: 10 mg via INTRAVENOUS

## 2017-08-21 MED FILL — VITAMIN B-12 1,000 MCG TAB: 1000 | 100 days supply | Qty: 100 | Fill #0

## 2017-08-21 NOTE — Progress Notes (Signed)
[place]  Telephone:(336) [phone] Fax:(336) [phone]  Clinic Follow up Note   Patient Care Team: [name], FNP as PCP - General (Family Medicine) 08/21/2017  SUMMARY OF ONCOLOGIC HISTORY: Oncology History   Cancer Staging Gastric cancer [facility]) Staging form: Stomach, AJCC 8th Edition - Clinical stage from 06/04/2017: Stage IVB (cT4, cN0, cM1) - Signed by [name], MD on 06/10/2017       Gastric cancer ([place])   06/03/2017 - 06/06/2017 Hospital Admission    Admitted to the hospital on 06/03/17 with complaints of fatigue and melena. During his stay he underwent an endoscopy that revealed a malignant gastric tumor in the cardia and in the gastric fundus. Biopsy results revealed adenocarcinoma. Pt was discharged on 06/06/17.      06/04/2017 Initial Biopsy    Diagnosis 06/04/17 Stomach, biopsy, Fundus - ADENOCARCINOMA - SEE COMMENT Microscopic Comment The stomach biopsy is of an adenocarcinoma arising in a background of intestinal metaplasia. A Warthin-Starry stain is performed to determine the possibility of the presence of Helicobacter pylori. The Warthin-Starry stain is negative for organisms morphologically consistent with Helicobacter pylori.      06/04/2017 Procedure    Esophagogastroduodenoscopy 06/04/17 by Dr. Benson Norway Impression: Normal esophagus. Malignant gastric tumor in the cardia and in the  gastric fundus. Biopsied. Normal examined duodenum.      06/05/2017 Imaging    CT CAP W Contrast 06/05/17 IMPRESSION: 9.1 cm fungating mass in the gastric cardia/posterior gastric fundus, corresponding to the patient's newly diagnosed gastric Cancer. Associated extra gastric extension with peritoneal disease in the left upper abdomen, including a dominant 4.2 cm peritoneal implant. Small volume pelvic ascites. 1.8 cm hypoenhancing lesion along the inferior spleen, indeterminate.      06/10/2017 Initial Diagnosis    Gastric cancer ([place])     CURRENT  THERAPY:first line chemo FOLFOX every 2 weeks, started on 06/26/2017  INTERVAL HISTORY: Ryan Collins returns for follow-up as scheduled prior to cycle 5 FOLFOX.  He completed cycle 4 on 08/07/2017.  Cold sensitivity last 4-5 days, no numbness or tingling in hands or feet.  Eating and drinking well with good appetite, no mucositis.  Occasionally takes Compazine to control nausea, no diarrhea.  Intermittent abdominal pain has improved overall, requiring less Tylenol.  REVIEW OF SYSTEMS:   Constitutional: Denies fevers, chills or abnormal weight loss (+) weight gain (+) periodical mild fatigue  Eyes: Denies blurriness of vision Ears, nose, mouth, throat, and face: Denies mucositis or sore throat Respiratory: Denies cough, dyspnea or wheezes Cardiovascular: Denies palpitation, chest discomfort, lower extremity swelling, or calf pain  Gastrointestinal:  Denies nausea, vomiting, constipation, diarrhea, heartburn or change in bowel habits (+) intermittent mild abdominal pain, improving   Skin: Denies abnormal skin rashes Lymphatics: Denies new lymphadenopathy or easy bruising Neurological:Denies numbness, tingling or new weaknesses (+) cold sensitivity 4-5 days after FOLFOX  Behavioral/Psych: Mood is stable, no new changes  All other systems were reviewed with the patient and are negative.  MEDICAL HISTORY:  Past Medical History:  Diagnosis Date  . DVT (deep venous thrombosis) ([place]) 2011  . GI bleeding 2001  . History of DVT of lower extremity   . Melena 05/2017  . Stomach ulcer    Clinically suspected; No EGD confirmation as of 06/03/17    SURGICAL HISTORY: Past Surgical History:  Procedure Laterality Date  . ESOPHAGOGASTRODUODENOSCOPY  06/04/2017  . ESOPHAGOGASTRODUODENOSCOPY N/A 06/04/2017   Procedure: ESOPHAGOGASTRODUODENOSCOPY (EGD);  Surgeon: Carol Ada, MD;  Location: [facility];  Service: Endoscopy;  Laterality: N/A;  . IR FLUORO GUIDE PORT INSERTION RIGHT  06/20/2017  . IR US  GUIDE VASC ACCESS RIGHT  06/20/2017    I have reviewed the social history and family history with the patient and they are unchanged from previous note.  ALLERGIES:  has No Known Allergies.  MEDICATIONS:  Current Outpatient Medications  Medication Sig Dispense Refill  . acetaminophen (TYLENOL) 500 MG tablet Take 1,000 mg by mouth every 6 (six) hours as needed for mild pain.     Marland Kitchen amLODipine (NORVASC) 5 MG tablet Take 1 tablet (5 mg total) by mouth daily. 30 tablet 5  . Blood Pressure Monitoring (BLOOD PRESSURE MONITOR/M CUFF) MISC 1 each by Does not apply route daily. 1 each 0  . Cyanocobalamin (VITAMIN B-12 PO) Take 1 tablet by mouth daily.    Marland Kitchen lidocaine-prilocaine (EMLA) cream Apply 1 application topically as needed (for port).    . ondansetron (ZOFRAN) 8 MG tablet Take 8 mg by mouth 2 (two) times daily as needed for refractory nausea / vomiting. Start on day 3 after chemo    . prochlorperazine (COMPAZINE) 10 MG tablet Take 10 mg by mouth every 6 (six) hours as needed for nausea or vomiting.    . thiamine 100 MG tablet Take 1 tablet (100 mg total) by mouth daily. 30 tablet 3  . pantoprazole (PROTONIX) 40 MG tablet Take 1 tablet (40 mg total) by mouth 2 (two) times daily. 60 tablet 3   No current facility-administered medications for this visit.    Facility-Administered Medications Ordered in Other Visits  Medication Dose Route Frequency Provider Last Rate Last Dose  . fluorouracil (ADRUCIL) 4,700 mg in sodium chloride 0.9 % 56 mL chemo infusion  2,400 mg/m2 (Treatment Plan Recorded) Intravenous 1 day or 1 dose [name], MD      . fluorouracil (ADRUCIL) chemo injection 800 mg  400 mg/m2 (Treatment Plan Recorded) Intravenous Once [name], MD      . leucovorin 780 mg in dextrose 5 % 250 mL infusion  400 mg/m2 (Treatment Plan Recorded) Intravenous Once [name], MD      . oxaliplatin (ELOXATIN) 165 mg in dextrose 5 % 500 mL chemo infusion  85 mg/m2 (Treatment Plan Recorded) Intravenous  Once [name], MD        PHYSICAL EXAMINATION: ECOG PERFORMANCE STATUS: 1 - Symptomatic but completely ambulatory  Vitals:   08/21/17 0849  BP: 124/76  Pulse: 80  Resp: 18  Temp: 98.3 F (36.8 C)  SpO2: 100%   Filed Weights   08/21/17 0849  Weight: 176 lb 6.4 oz (80 kg)    GENERAL:alert, no distress and comfortable SKIN: skin color, texture, turgor are normal, no rashes or significant lesions EYES: normal, Conjunctiva are pink and non-injected, sclera clear OROPHARYNX:no exudate, no erythema and lips, buccal mucosa, and tongue normal  LYMPH:  no palpable cervical, supraclavicular, or axillary lymphadenopathy LUNGS: clear to auscultation with normal breathing effort HEART: regular rate & rhythm and no murmurs and no lower extremity edema ABDOMEN:abdomen soft, non-tender and normal bowel sounds. No hepatomegaly   Musculoskeletal:no cyanosis of digits and no clubbing  NEURO: alert & oriented x 3 with fluent speech, no focal motor/sensory deficits PAC without erythema    LABORATORY DATA:  I have reviewed the data as listed CBC Latest Ref Rng & Units 08/21/2017 08/07/2017 07/24/2017  WBC 4.0 - 10.3 K/uL 8.5 7.0 7.5  Hemoglobin 13.0 - 17.0 g/dL - - -  Hematocrit 38.4 - 49.9 % 34.5(L)  33.9(L) 33.8(L)  Platelets 140 - 400 K/uL 246 262 304     CMP Latest Ref Rng & Units 08/21/2017 08/07/2017 07/24/2017  Glucose 70 - 140 mg/dL 92 94 93  BUN 7 - 26 mg/dL 11 10 11   Creatinine 0.70 - 1.30 mg/dL 0.81 0.78 0.78  Sodium 136 - 145 mmol/L 139 140 138  Potassium 3.5 - 5.1 mmol/L 4.1 4.0 4.2  Chloride 98 - 109 mmol/L 109 110(H) 106  CO2 22 - 29 mmol/L 24 24 26   Calcium 8.4 - 10.4 mg/dL 9.1 9.2 9.1  Total Protein 6.4 - 8.3 g/dL 6.7 6.7 6.7  Total Bilirubin 0.2 - 1.2 mg/dL 0.3 0.4 0.4  Alkaline Phos 40 - 150 U/L 54 53 46  AST 5 - 34 U/L 14 12 15   ALT 0 - 55 U/L 13 10 13    PATHOLOGY  Diagnosis 06/04/17 Stomach, biopsy, Fundus - ADENOCARCINOMA - SEE COMMENT Microscopic Comment The  stomach biopsy is of an adenocarcinoma arising in a background of intestinal metaplasia. A Warthin-Starry stain is performed to determine the possibility of the presence of Helicobacter pylori. The Warthin-Starry stain is negative for organisms morphologically consistent with Helicobacter pylori.         RADIOGRAPHIC STUDIES: I have personally reviewed the radiological images as listed and agreed with the findings in the report. No results found.   ASSESSMENT & PLAN: Lianne Cure y.o.male, without significant past medical history, but also does not see doctors routinely, presented with worsening fatigue, intermittent epigastric pain and melena for 3-4 months, and 50 pounds weight loss over the past 6 months.  1. Gastric cancer, T4bNxM1 with peritoneal metastasis, adenocarcinoma, HER2(-), MSI-stable, PD-L1 positive (5) 2.  Anemia of iron deficiency and tumor bleeding, B12 deficiency 3.  History of DVT, protein C deficiency 4.  History of alcohol and marijuana abuse 5.  Weight loss and malnutrition 6.  Goal of care discussion - full code  Ryan Collins completed 4 cycles FOLFOX chemotherapy, he is tolerating treatment very well. VSS, weight is improved. Labs reviewed. Anemia is stable, Hgb 11.2; CMP unremarkable. Proceed with cycle 5 FOLFOX today at current doses. Restaging CT prior to cycle 6, ordered today. Return in 2 weeks for f/u with CT results and next cycle.   PLAN: -Labs reviewed, proceed with cycle 5 FOLFOX at current doses -Restaging CT in 2 weeks before cycle 6, ordered today; patient to pick up contrast today -F/u in 2 weeks with scan results and cycle 6   Orders Placed This Encounter  Procedures  . CT Abdomen Pelvis W Contrast    Standing Status:   Future    Standing Expiration Date:   08/21/2018    Order Specific Question:   If indicated for the ordered procedure, I authorize the administration of contrast media per Radiology protocol    Answer:   Yes     Order Specific Question:   Preferred imaging location?    Answer:   [facility]    Order Specific Question:   Radiology Contrast Protocol - do NOT remove file path    Answer:   [email or web address]    Order Specific Question:   Reason for Exam additional comments    Answer:   gastric cancer s/p 5 cycles chemotherapy, restaging  . CT Chest W Contrast    Standing Status:   Future    Standing Expiration Date:   08/21/2018    Order Specific Question:   If indicated for the ordered procedure, I authorize the administration  of contrast media per Radiology protocol    Answer:   Yes    Order Specific Question:   Preferred imaging location?    Answer:   [facility]    Order Specific Question:   Radiology Contrast Protocol - do NOT remove file path    Answer:   [email or web address]    Order Specific Question:   Reason for Exam additional comments    Answer:   gastric cancer s/p 5 cycles chemotherapy, restaging   All questions were answered. The patient knows to call the clinic with any problems, questions or concerns. No barriers to learning was detected.     Alla Feeling, NP 08/21/17

## 2017-08-21 NOTE — Patient Instructions (Signed)
[facility] Discharge Instructions for Patients Receiving Chemotherapy  Today you received the following chemotherapy agents Oxaliplatin,leucovorin, Adrucil To help prevent nausea and vomiting after your treatment, we encourage you to take your nausea medication as directed  If you develop nausea and vomiting that is not controlled by your nausea medication, call the clinic.   BELOW ARE SYMPTOMS THAT SHOULD BE REPORTED IMMEDIATELY:  *FEVER GREATER THAN 100.5 F  *CHILLS WITH OR WITHOUT FEVER  NAUSEA AND VOMITING THAT IS NOT CONTROLLED WITH YOUR NAUSEA MEDICATION  *UNUSUAL SHORTNESS OF BREATH  *UNUSUAL BRUISING OR BLEEDING  TENDERNESS IN MOUTH AND THROAT WITH OR WITHOUT PRESENCE OF ULCERS  *URINARY PROBLEMS  *BOWEL PROBLEMS  UNUSUAL RASH Items with * indicate a potential emergency and should be followed up as soon as possible.  Feel free to call the clinic should you have any questions or concerns. The clinic phone number is [phone].  Please show the CHEMO ALERT CARD at check-in to the Emergency Department and triage nurse.   

## 2017-08-21 NOTE — Telephone Encounter (Signed)
Scheduled appt per 4/10 los - Gave patient AVS and calender per los. Per Lacie okay to take 9 am slot on 4/24 due to limited availability on YF schedule- YF follow up on 5/8.

## 2017-08-21 NOTE — Addendum Note (Signed)
Addended by: Alla Feeling on: 08/21/2017 02:42 PM   Modules accepted: Orders

## 2017-08-23 ENCOUNTER — Inpatient Hospital Stay: Payer: BLUE CROSS/BLUE SHIELD

## 2017-08-23 VITALS — BP 128/72 | HR 80 | Temp 98.6°F | Resp 18

## 2017-08-23 DIAGNOSIS — C161 Malignant neoplasm of fundus of stomach: Secondary | ICD-10-CM

## 2017-08-23 MED ORDER — SODIUM CHLORIDE 0.9% FLUSH
10.0000 mL | INTRAVENOUS | Status: DC | PRN
  Administered 2017-08-23: 10 mL
  Filled 2017-08-23: qty 10

## 2017-08-23 MED ORDER — HEPARIN SOD (PORK) LOCK FLUSH 100 UNIT/ML IV SOLN
500.0000 [IU] | Freq: Once | INTRAVENOUS | Status: AC | PRN
  Administered 2017-08-23: 500 [IU]
  Filled 2017-08-23: qty 5

## 2017-08-29 ENCOUNTER — Ambulatory Visit (HOSPITAL_COMMUNITY)
Admission: RE | Admit: 2017-08-29 | Discharge: 2017-08-29 | Disposition: A | Discharge status: Home / Self Care | Payer: BLUE CROSS/BLUE SHIELD | Source: Ambulatory Visit | Attending: Nurse Practitioner | Admitting: Nurse Practitioner

## 2017-08-29 DIAGNOSIS — N281 Cyst of kidney, acquired: Secondary | ICD-10-CM | POA: Insufficient documentation

## 2017-08-29 DIAGNOSIS — K769 Liver disease, unspecified: Secondary | ICD-10-CM | POA: Diagnosis not present

## 2017-08-29 DIAGNOSIS — K669 Disorder of peritoneum, unspecified: Secondary | ICD-10-CM | POA: Insufficient documentation

## 2017-08-29 DIAGNOSIS — C161 Malignant neoplasm of fundus of stomach: Secondary | ICD-10-CM | POA: Insufficient documentation

## 2017-08-29 DIAGNOSIS — N400 Enlarged prostate without lower urinary tract symptoms: Secondary | ICD-10-CM | POA: Diagnosis not present

## 2017-08-29 DIAGNOSIS — I700 Atherosclerosis of aorta: Secondary | ICD-10-CM | POA: Diagnosis not present

## 2017-08-29 MED ORDER — IOHEXOL 300 MG/ML  SOLN
100.0000 mL | Freq: Once | INTRAMUSCULAR | Status: AC | PRN
  Administered 2017-08-29: 100 mL via INTRAVENOUS

## 2017-09-01 NOTE — Progress Notes (Addendum)
[place]  [phone] [phone]  Clinic Follow up Note   Patient Care Team: [name], FNP as PCP - General (Family Medicine) 09/04/2017  SUMMARY OF ONCOLOGIC HISTORY: Oncology History   Cancer Staging Gastric cancer [facility]) Staging form: Stomach, AJCC 8th Edition - Clinical stage from 06/04/2017: Stage IVB (cT4, cN0, cM1) - Signed by [name], MD on 06/10/2017       Gastric cancer ([place])   06/03/2017 - 06/06/2017 Hospital Admission    Admitted to the hospital on 06/03/17 with complaints of fatigue and melena. During his stay he underwent an endoscopy that revealed a malignant gastric tumor in the cardia and in the gastric fundus. Biopsy results revealed adenocarcinoma. Pt was discharged on 06/06/17.      06/04/2017 Initial Biopsy    Diagnosis 06/04/17 Stomach, biopsy, Fundus - ADENOCARCINOMA - SEE COMMENT Microscopic Comment The stomach biopsy is of an adenocarcinoma arising in a background of intestinal metaplasia. A Warthin-Starry stain is performed to determine the possibility of the presence of Helicobacter pylori. The Warthin-Starry stain is negative for organisms morphologically consistent with Helicobacter pylori.      06/04/2017 Procedure    Esophagogastroduodenoscopy 06/04/17 by Dr. Benson Norway Impression: Normal esophagus. Malignant gastric tumor in the cardia and in the  gastric fundus. Biopsied. Normal examined duodenum.      06/05/2017 Imaging    CT CAP W Contrast 06/05/17 IMPRESSION: 9.1 cm fungating mass in the gastric cardia/posterior gastric fundus, corresponding to the patient's newly diagnosed gastric Cancer. Associated extra gastric extension with peritoneal disease in the left upper abdomen, including a dominant 4.2 cm peritoneal implant. Small volume pelvic ascites. 1.8 cm hypoenhancing lesion along the inferior spleen, indeterminate.      06/10/2017 Initial Diagnosis    Gastric cancer ([place])      08/29/2017 Imaging      IMPRESSION: 1. Interval decrease in size of fungating ulcerative mass arising from the posterior gastric fundus. 2. Left peritoneal implant, compatible with peritoneal carcinomatosis, has decreased in size from the previous exam. 3. Right lobe of liver lesion present on previous exam is less conspicuous on today's study. A new lesion is identified within the left lobe of liver, suspicious for metastasis. Additional focus of low attenuation within segment 5 adjacent to the gallbladder fossa may represent focal fatty deposition. 4. Similar appearance of mildly complex cyst containing mural calcification and possible internal septation arising from the inferior pole of right kidney. More definitive characterization of this lesion with renal protocol MRI may be helpful. 5.  Mild prostate gland enlargement. 6.  Aortic Atherosclerosis (ICD10-I70.0).     CURRENT THERAPY:first line chemo FOLFOX every 2 weeks, started on 06/26/2017  INTERVAL HISTORY: Mr. Ahn returns for follow up as scheduled prior to cycle 6 FOLFOX. He completed cycle 5 on 08/21/17 without difficulty. His stomach pain continues to improve; has mild intermittent stomach pain well controlled with normal-strength tylenol 1-2 times per week. Denies fatigue, decreased appetite, mucositis, diarrhea, neuropathy, or cold sensitivity. He is curious about his scan results.   REVIEW OF SYSTEMS:   Constitutional: Denies fatigue, fevers, chills or abnormal weight loss Eyes: Denies blurriness of vision Ears, nose, mouth, throat, and face: Denies mucositis or sore throat Respiratory: Denies cough, dyspnea or wheezes Cardiovascular: Denies palpitation, chest discomfort or lower extremity swelling Gastrointestinal:  Denies nausea, vomiting, constipation, diarrhea, hematochezia, heartburn or change in bowel habits (+) mild intermittent stomach pain, controlled with tylenol 1-2 xper week  Skin: Denies abnormal skin rashes Lymphatics:  Denies new  lymphadenopathy or easy bruising Neurological:Denies numbness, tingling, cold sensitivity, or new weaknesses Behavioral/Psych: Mood is stable, no new changes  All other systems were reviewed with the patient and are negative.  MEDICAL HISTORY:  Past Medical History:  Diagnosis Date  . DVT (deep venous thrombosis) ([place]) 2011  . GI bleeding 2001  . History of DVT of lower extremity   . Melena 05/2017  . Stomach ulcer    Clinically suspected; No EGD confirmation as of 06/03/17    SURGICAL HISTORY: Past Surgical History:  Procedure Laterality Date  . ESOPHAGOGASTRODUODENOSCOPY  06/04/2017  . ESOPHAGOGASTRODUODENOSCOPY N/A 06/04/2017   Procedure: ESOPHAGOGASTRODUODENOSCOPY (EGD);  Surgeon: Carol Ada, MD;  Location: [place];  Service: Endoscopy;  Laterality: N/A;  . IR FLUORO GUIDE PORT INSERTION RIGHT  06/20/2017  . IR US GUIDE VASC ACCESS RIGHT  06/20/2017    I have reviewed the social history and family history with the patient and they are unchanged from previous note.  ALLERGIES:  has No Known Allergies.  MEDICATIONS:  Current Outpatient Medications  Medication Sig Dispense Refill  . acetaminophen (TYLENOL) 500 MG tablet Take 1,000 mg by mouth every 6 (six) hours as needed for mild pain.     Marland Kitchen amLODipine (NORVASC) 5 MG tablet Take 1 tablet (5 mg total) by mouth daily. 30 tablet 5  . Blood Pressure Monitoring (BLOOD PRESSURE MONITOR/M CUFF) MISC 1 each by Does not apply route daily. 1 each 0  . lidocaine-prilocaine (EMLA) cream Apply 1 application topically as needed (for port).    . ondansetron (ZOFRAN) 8 MG tablet Take 8 mg by mouth 2 (two) times daily as needed for refractory nausea / vomiting. Start on day 3 after chemo    . prochlorperazine (COMPAZINE) 10 MG tablet Take 10 mg by mouth every 6 (six) hours as needed for nausea or vomiting.    . thiamine 100 MG tablet Take 1 tablet (100 mg total) by mouth daily. 30 tablet 3  . vitamin B-12 (CYANOCOBALAMIN) 1000 MCG tablet  Take 1 tablet (1,000 mcg total) by mouth daily. 30 tablet 0  . pantoprazole (PROTONIX) 40 MG tablet Take 1 tablet (40 mg total) by mouth 2 (two) times daily. 60 tablet 0   No current facility-administered medications for this visit.    Facility-Administered Medications Ordered in Other Visits  Medication Dose Route Frequency Provider Last Rate Last Dose  . fluorouracil (ADRUCIL) 4,700 mg in sodium chloride 0.9 % 56 mL chemo infusion  2,400 mg/m2 (Treatment Plan Recorded) Intravenous 1 day or 1 dose [name], MD   4,700 mg at 09/04/17 1311    PHYSICAL EXAMINATION: ECOG PERFORMANCE STATUS: 0 - Asymptomatic  Vitals:   09/04/17 0850  BP: 124/78  Pulse: 84  Resp: 18  Temp: 98 F (36.7 C)  SpO2: 100%   Filed Weights   09/04/17 0850  Weight: 178 lb 3.2 oz (80.8 kg)    GENERAL:alert, no distress and comfortable SKIN: skin color, texture, turgor are normal, no rashes or significant lesions EYES: normal, Conjunctiva are pink and non-injected, sclera clear OROPHARYNX:no exudate, no erythema and lips, buccal mucosa, and tongue normal  LYMPH:  no palpable cervical, supraclavicular, or axillary lymphadenopathy LUNGS: clear to auscultation with normal breathing effort HEART: regular rate & rhythm and no murmurs and no lower extremity edema ABDOMEN:abdomen soft, non-tender and normal bowel sounds. No hepatomegaly  Musculoskeletal:no cyanosis of digits and no clubbing  NEURO: alert & oriented x 3 with fluent speech, no focal motor/sensory deficits  PAC without erythema   LABORATORY DATA:  I have reviewed the data as listed CBC Latest Ref Rng & Units 09/04/2017 08/21/2017 08/07/2017  WBC 4.0 - 10.3 K/uL 7.7 8.5 7.0  Hemoglobin 13.0 - 17.1 g/dL 12.2(L) 11.2(L) 10.9(L)  Hematocrit 38.4 - 49.9 % 37.3(L) 34.5(L) 33.9(L)  Platelets 140 - 400 K/uL 221 246 262     CMP Latest Ref Rng & Units 09/04/2017 08/21/2017 08/07/2017  Glucose 70 - 140 mg/dL 96 92 94  BUN 7 - 26 mg/dL _0 Creatinine  0.70 - 1.30 mg/dL 0.90 0.81 0.78  Sodium 136 - 145 mmol/L 138 139 140  Potassium 3.5 - 5.1 mmol/L 4.0 4.1 4.0  Chloride 98 - 109 mmol/L 108 109 110(H)  CO2 22 - 29 mmol/L 20(L) 24 24  Calcium 8.4 - 10.4 mg/dL 9.6 9.1 9.2  Total Protein 6.4 - 8.3 g/dL 7.1 6.7 6.7  Total Bilirubin 0.2 - 1.2 mg/dL 0.4 0.3 0.4  Alkaline Phos 40 - 150 U/L 52 54 53  AST 5 - 34 U/L _1 ALT 0 - 55 U/L _2 PATHOLOGY  Diagnosis 06/04/17 Stomach, biopsy, Fundus - ADENOCARCINOMA - SEE COMMENT Microscopic Comment The stomach biopsy is of an adenocarcinoma arising in a background of intestinal metaplasia. A Warthin-Starry stain is performed to determine the possibility of the presence of Helicobacter pylori. The Warthin-Starry stain is negative for organisms morphologically consistent with Helicobacter pylori.         RADIOGRAPHIC STUDIES: I have personally reviewed the radiological images as listed and agreed with the findings in the report. No results found.   ASSESSMENT & PLAN: [age]ian[age] Cure y.o.male, without significant past medical history, but also does not see doctors routinely, presented with worsening fatigue, intermittent epigastric pain and melena for 3-4 months, and 50 pounds weight loss over the past 6 months.  1. Gastric cancer, T4bNxM1 with peritoneal metastasis, adenocarcinoma, HER2(-), MSI-stable, PD-L1positive (5) 2.Anemia of iron deficiency and tumor bleeding, B12 deficiency 3.History of DVT, protein C deficiency 4.History of alcohol and marijuana abuse 5.Weight loss and malnutrition 6.Goal of care discussion - fullcode  Mr. Pernell appears stable. He completed cycle 5 FOLFOX on 08/21/17. He is tolerating treatment very well without significant side effects. His pain is improved. CEA trending down, further decreased to 11.02 today. I reviewed CT images with Dr. Burr Medico and she discussed with the radiologist. We reviewed with the patient that the gastric  fundus mass and peritoneal implant have decreased in size. There was a subtle lesion in the right lobe of the liver on previous study in 05/2017 that was not reported. That area is less conspicuous on 4/18 CT. There is a subtle new lesion in the left lobe that is suspicious for metastasis. Overall, clinically and radiographically he has improved. The patient was seen with Dr. Burr Medico who recommends to continue current FOLFOX chemotherapy. She reviewed he is not a good candidate for immunotherapy; PD-L1 is weakly positive. However this may be an option in the future. Will add EBV testing to pathology. Iron studies are adequate, does not need additional iron for now. Proceed with cycle 6 FOLFOX today. F/u in 2 weeks with next cycle.  PLAN: -Labs and imaging reviewed, proceed with cycle 6 FOLFOX today, continue q2 weeks -Return for f/u with Dr. Burr Medico in 2 weeks with cycle 7 -Requested EBV testing on surgical path -Refilled protonix  All questions were answered. The patient knows to call the clinic with any  problems, questions or concerns. No barriers to learning was detected. I spent 20 minutes counseling the patient face to face. The total time spent in the appointment was 25 minutes and more than 50% was on counseling and review of test results     Alla Feeling, NP 09/04/17   Addendum  I have seen the patient, examined him. I agree with the assessment and and plan and have edited the notes.   Mr. Traynham is clinically doing well, and tolerating chemo well. I have personally reviewed his recent restaging CT, and discussed with radiologist Dr. [name]. I agree with his interpretation. He has had good partial response, except one indeterminate lesion in left lobe of liver. I recommend continuing chemo. Pt agrees.   [name]  09/04/2017

## 2017-09-04 ENCOUNTER — Inpatient Hospital Stay: Payer: BLUE CROSS/BLUE SHIELD

## 2017-09-04 ENCOUNTER — Encounter: Payer: Self-pay | Admitting: Nurse Practitioner

## 2017-09-04 ENCOUNTER — Inpatient Hospital Stay (HOSPITAL_BASED_OUTPATIENT_CLINIC_OR_DEPARTMENT_OTHER): Payer: BLUE CROSS/BLUE SHIELD | Admitting: Nurse Practitioner

## 2017-09-04 ENCOUNTER — Telehealth: Payer: Self-pay

## 2017-09-04 VITALS — BP 124/78 | HR 84 | Temp 98.0°F | Resp 18 | Ht 71.5 in | Wt 178.2 lb

## 2017-09-04 DIAGNOSIS — D500 Iron deficiency anemia secondary to blood loss (chronic): Secondary | ICD-10-CM

## 2017-09-04 DIAGNOSIS — R110 Nausea: Secondary | ICD-10-CM | POA: Diagnosis not present

## 2017-09-04 DIAGNOSIS — D6859 Other primary thrombophilia: Secondary | ICD-10-CM

## 2017-09-04 DIAGNOSIS — G893 Neoplasm related pain (acute) (chronic): Secondary | ICD-10-CM | POA: Diagnosis not present

## 2017-09-04 DIAGNOSIS — Z79899 Other long term (current) drug therapy: Secondary | ICD-10-CM

## 2017-09-04 DIAGNOSIS — C786 Secondary malignant neoplasm of retroperitoneum and peritoneum: Secondary | ICD-10-CM

## 2017-09-04 DIAGNOSIS — Z86718 Personal history of other venous thrombosis and embolism: Secondary | ICD-10-CM

## 2017-09-04 DIAGNOSIS — E46 Unspecified protein-calorie malnutrition: Secondary | ICD-10-CM

## 2017-09-04 DIAGNOSIS — C161 Malignant neoplasm of fundus of stomach: Secondary | ICD-10-CM

## 2017-09-04 DIAGNOSIS — R5383 Other fatigue: Secondary | ICD-10-CM | POA: Diagnosis not present

## 2017-09-04 DIAGNOSIS — R1013 Epigastric pain: Secondary | ICD-10-CM | POA: Diagnosis not present

## 2017-09-04 DIAGNOSIS — C169 Malignant neoplasm of stomach, unspecified: Secondary | ICD-10-CM

## 2017-09-04 LAB — IRON AND TIBC
Iron: 97 ug/dL (ref 42–163)
SATURATION RATIOS: 36 % — AB (ref 42–163)
TIBC: 271 ug/dL (ref 202–409)
UIBC: 174 ug/dL

## 2017-09-04 LAB — CBC WITH DIFFERENTIAL (CANCER CENTER ONLY)
BASOS ABS: 0 10*3/uL (ref 0.0–0.1)
BASOS PCT: 1 %
EOS ABS: 0.2 10*3/uL (ref 0.0–0.5)
EOS PCT: 2 %
HCT: 37.3 % — ABNORMAL LOW (ref 38.4–49.9)
HEMOGLOBIN: 12.2 g/dL — AB (ref 13.0–17.1)
LYMPHS ABS: 2.1 10*3/uL (ref 0.9–3.3)
Lymphocytes Relative: 27 %
MCH: 27.7 pg (ref 27.2–33.4)
MCHC: 32.6 g/dL (ref 32.0–36.0)
MCV: 84.9 fL (ref 79.3–98.0)
Monocytes Absolute: 1.2 10*3/uL — ABNORMAL HIGH (ref 0.1–0.9)
Monocytes Relative: 16 %
NEUTROS PCT: 54 %
Neutro Abs: 4.2 10*3/uL (ref 1.5–6.5)
PLATELETS: 221 10*3/uL (ref 140–400)
RBC: 4.4 MIL/uL (ref 4.20–5.82)
RDW: 26.6 % — ABNORMAL HIGH (ref 11.0–14.6)
WBC: 7.7 10*3/uL (ref 4.0–10.3)

## 2017-09-04 LAB — CMP (CANCER CENTER ONLY)
ALBUMIN: 3.9 g/dL (ref 3.5–5.0)
ALK PHOS: 52 U/L (ref 40–150)
ALT: 20 U/L (ref 0–55)
AST: 20 U/L (ref 5–34)
Anion gap: 10 (ref 3–11)
BUN: 13 mg/dL (ref 7–26)
CALCIUM: 9.6 mg/dL (ref 8.4–10.4)
CHLORIDE: 108 mmol/L (ref 98–109)
CO2: 20 mmol/L — AB (ref 22–29)
CREATININE: 0.9 mg/dL (ref 0.70–1.30)
GFR, Estimated: >60 mL/min (ref >60)
Glucose, Bld: 96 mg/dL (ref 70–140)
Potassium: 4 mmol/L (ref 3.5–5.1)
SODIUM: 138 mmol/L (ref 136–145)
Total Bilirubin: 0.4 mg/dL (ref 0.2–1.2)
Total Protein: 7.1 g/dL (ref 6.4–8.3)

## 2017-09-04 LAB — CEA (IN HOUSE-CHCC): CEA (CHCC-In House): 11.02 ng/mL — ABNORMAL HIGH (ref 0.00–5.00)

## 2017-09-04 LAB — FERRITIN: FERRITIN: 175 ng/mL (ref 22–316)

## 2017-09-04 MED ORDER — LEUCOVORIN CALCIUM INJECTION 350 MG
400.0000 mg/m2 | Freq: Once | INTRAVENOUS | Status: AC
  Administered 2017-09-04: 780 mg via INTRAVENOUS
  Filled 2017-09-04: qty 39

## 2017-09-04 MED ORDER — DEXAMETHASONE SODIUM PHOSPHATE 10 MG/ML IJ SOLN
INTRAMUSCULAR | Status: AC
  Filled 2017-09-04: qty 1

## 2017-09-04 MED ORDER — SODIUM CHLORIDE 0.9 % IV SOLN
2400.0000 mg/m2 | INTRAVENOUS | Status: DC
  Administered 2017-09-04: 4700 mg via INTRAVENOUS
  Filled 2017-09-04: qty 94

## 2017-09-04 MED ORDER — PANTOPRAZOLE SODIUM 40 MG PO TBEC: 40.0000 mg | DELAYED_RELEASE_TABLET | Freq: Two times a day (BID) | ORAL | 0 refills | Status: DC

## 2017-09-04 MED ORDER — FLUOROURACIL CHEMO INJECTION 2.5 GM/50ML
400.0000 mg/m2 | Freq: Once | INTRAVENOUS | Status: AC
  Administered 2017-09-04: 800 mg via INTRAVENOUS
  Filled 2017-09-04: qty 16

## 2017-09-04 MED ORDER — DEXAMETHASONE SODIUM PHOSPHATE 10 MG/ML IJ SOLN
10.0000 mg | Freq: Once | INTRAMUSCULAR | Status: AC
  Administered 2017-09-04: 10 mg via INTRAVENOUS

## 2017-09-04 MED ORDER — DEXTROSE 5 % IV SOLN
Freq: Once | INTRAVENOUS | Status: AC
  Administered 2017-09-04: 10:00:00 via INTRAVENOUS

## 2017-09-04 MED ORDER — PALONOSETRON HCL INJECTION 0.25 MG/5ML
0.2500 mg | Freq: Once | INTRAVENOUS | Status: AC
  Administered 2017-09-04: 0.25 mg via INTRAVENOUS

## 2017-09-04 MED ORDER — DEXTROSE 5 % IV SOLN
85.0000 mg/m2 | Freq: Once | INTRAVENOUS | Status: AC
  Administered 2017-09-04: 165 mg via INTRAVENOUS
  Filled 2017-09-04: qty 33

## 2017-09-04 MED ORDER — SODIUM CHLORIDE 0.9% FLUSH
10.0000 mL | INTRAVENOUS | Status: DC | PRN
  Administered 2017-09-04: 10 mL via INTRAVENOUS
  Filled 2017-09-04: qty 10

## 2017-09-04 MED ORDER — PALONOSETRON HCL INJECTION 0.25 MG/5ML
INTRAVENOUS | Status: AC
  Filled 2017-09-04: qty 5

## 2017-09-04 NOTE — Patient Instructions (Signed)
[facility] Discharge Instructions for Patients Receiving Chemotherapy  Today you received the following chemotherapy agents Oxaliplatin,leucovorin, Adrucil To help prevent nausea and vomiting after your treatment, we encourage you to take your nausea medication as directed  If you develop nausea and vomiting that is not controlled by your nausea medication, call the clinic.   BELOW ARE SYMPTOMS THAT SHOULD BE REPORTED IMMEDIATELY:  *FEVER GREATER THAN 100.5 F  *CHILLS WITH OR WITHOUT FEVER  NAUSEA AND VOMITING THAT IS NOT CONTROLLED WITH YOUR NAUSEA MEDICATION  *UNUSUAL SHORTNESS OF BREATH  *UNUSUAL BRUISING OR BLEEDING  TENDERNESS IN MOUTH AND THROAT WITH OR WITHOUT PRESENCE OF ULCERS  *URINARY PROBLEMS  *BOWEL PROBLEMS  UNUSUAL RASH Items with * indicate a potential emergency and should be followed up as soon as possible.  Feel free to call the clinic should you have any questions or concerns. The clinic phone number is [phone].  Please show the CHEMO ALERT CARD at check-in to the Emergency Department and triage nurse.   

## 2017-09-04 NOTE — Telephone Encounter (Signed)
-----   Message from Alla Feeling, NP sent at 09/04/2017  2:16 PM EDT ----- Please call ([facility]) lab and request EBV testing added to gastric fundus biopsy   Patient: Ryan Collins, Ryan Collins Collected: 06/04/2017  Accession: [id number]  DOB: 1960-06-28  Thanks, Regan Rakers

## 2017-09-04 NOTE — Telephone Encounter (Signed)
Called [facility] requested add EBV to gastric fundus biopsy pathology.

## 2017-09-05 ENCOUNTER — Telehealth: Payer: Self-pay | Admitting: Hematology

## 2017-09-05 ENCOUNTER — Other Ambulatory Visit (HOSPITAL_COMMUNITY)
Admission: RE | Admit: 2017-09-05 | Discharge: 2017-09-05 | Disposition: A | Discharge status: Home / Self Care | Payer: BLUE CROSS/BLUE SHIELD | Source: Ambulatory Visit | Attending: Hematology | Admitting: Hematology

## 2017-09-05 DIAGNOSIS — C169 Malignant neoplasm of stomach, unspecified: Secondary | ICD-10-CM | POA: Insufficient documentation

## 2017-09-05 DIAGNOSIS — C786 Secondary malignant neoplasm of retroperitoneum and peritoneum: Secondary | ICD-10-CM | POA: Insufficient documentation

## 2017-09-05 NOTE — Telephone Encounter (Signed)
Appointments scheduled Calendar/Letter mailed to patient per 4/24 los

## 2017-09-06 ENCOUNTER — Inpatient Hospital Stay: Payer: BLUE CROSS/BLUE SHIELD

## 2017-09-06 DIAGNOSIS — C161 Malignant neoplasm of fundus of stomach: Secondary | ICD-10-CM

## 2017-09-06 DIAGNOSIS — D500 Iron deficiency anemia secondary to blood loss (chronic): Secondary | ICD-10-CM

## 2017-09-06 MED ORDER — SODIUM CHLORIDE 0.9% FLUSH
10.0000 mL | INTRAVENOUS | Status: DC | PRN
  Administered 2017-09-06: 10 mL via INTRAVENOUS
  Filled 2017-09-06: qty 10

## 2017-09-06 MED ORDER — SODIUM CHLORIDE 0.9% FLUSH
10.0000 mL | INTRAVENOUS | Status: DC | PRN
  Filled 2017-09-06: qty 10

## 2017-09-06 MED ORDER — HEPARIN SOD (PORK) LOCK FLUSH 100 UNIT/ML IV SOLN
500.0000 [IU] | Freq: Once | INTRAVENOUS | Status: AC | PRN
  Administered 2017-09-06: 500 [IU]
  Filled 2017-09-06: qty 5

## 2017-09-06 MED ORDER — HEPARIN SOD (PORK) LOCK FLUSH 100 UNIT/ML IV SOLN
500.0000 [IU] | Freq: Once | INTRAVENOUS | Status: DC | PRN
  Filled 2017-09-06: qty 5

## 2017-09-06 NOTE — Patient Instructions (Signed)
Implanted Port Home Guide An implanted port is a type of central line that is placed under the skin. Central lines are used to provide IV access when treatment or nutrition needs to be given through a person's veins. Implanted ports are used for long-term IV access. An implanted port may be placed because:  You need IV medicine that would be irritating to the small veins in your hands or arms.  You need long-term IV medicines, such as antibiotics.  You need IV nutrition for a long period.  You need frequent blood draws for lab tests.  You need dialysis.  Implanted ports are usually placed in the chest area, but they can also be placed in the upper arm, the abdomen, or the leg. An implanted port has two main parts:  Reservoir. The reservoir is round and will appear as a small, raised area under your skin. The reservoir is the part where a needle is inserted to give medicines or draw blood.  Catheter. The catheter is a thin, flexible tube that extends from the reservoir. The catheter is placed into a large vein. Medicine that is inserted into the reservoir goes into the catheter and then into the vein.  How will I care for my incision site? Do not get the incision site wet. Bathe or shower as directed by your health care provider. How is my port accessed? Special steps must be taken to access the port:  Before the port is accessed, a numbing cream can be placed on the skin. This helps numb the skin over the port site.  Your health care provider uses a sterile technique to access the port. ? Your health care provider must put on a mask and sterile gloves. ? The skin over your port is cleaned carefully with an antiseptic and allowed to dry. ? The port is gently pinched between sterile gloves, and a needle is inserted into the port.  Only "non-coring" port needles should be used to access the port. Once the port is accessed, a blood return should be checked. This helps ensure that the port  is in the vein and is not clogged.  If your port needs to remain accessed for a constant infusion, a clear (transparent) bandage will be placed over the needle site. The bandage and needle will need to be changed every week, or as directed by your health care provider.  Keep the bandage covering the needle clean and dry. Do not get it wet. Follow your health care provider's instructions on how to take a shower or bath while the port is accessed.  If your port does not need to stay accessed, no bandage is needed over the port.  What is flushing? Flushing helps keep the port from getting clogged. Follow your health care provider's instructions on how and when to flush the port. Ports are usually flushed with saline solution or a medicine called heparin. The need for flushing will depend on how the port is used.  If the port is used for intermittent medicines or blood draws, the port will need to be flushed: ? After medicines have been given. ? After blood has been drawn. ? As part of routine maintenance.  If a constant infusion is running, the port may not need to be flushed.  How long will my port stay implanted? The port can stay in for as long as your health care provider thinks it is needed. When it is time for the port to come out, surgery will be   done to remove it. The procedure is similar to the one performed when the port was put in. When should I seek immediate medical care? When you have an implanted port, you should seek immediate medical care if:  You notice a bad smell coming from the incision site.  You have swelling, redness, or drainage at the incision site.  You have more swelling or pain at the port site or the surrounding area.  You have a fever that is not controlled with medicine.  This information is not intended to replace advice given to you by your health care provider. Make sure you discuss any questions you have with your health care provider. Document  Released: 04/30/2005 Document Revised: 10/06/2015 Document Reviewed: 01/05/2013 Elsevier Interactive Patient Education  2017 [employer]  

## 2017-09-16 NOTE — Progress Notes (Signed)
[place]  [phone]) [phone] Fax:([phone]) [phone]  Clinic Follow Up Note   Patient Care Team: [name], FNP as PCP - General (Family Medicine) 09/18/2017  CHIEF COMPLAINTS:  Follow up gastric Cancer  Oncology History   Cancer Staging Gastric cancer [facility]) Staging form: Stomach, AJCC 8th Edition - Clinical stage from 06/04/2017: Stage IVB (cT4, cN0, cM1) - Signed by [name], MD on 06/10/2017       Gastric cancer ([place])   06/03/2017 - 06/06/2017 Hospital Admission    Admitted to the hospital on 06/03/17 with complaints of fatigue and melena. During his stay he underwent an endoscopy that revealed a malignant gastric tumor in the cardia and in the gastric fundus. Biopsy results revealed adenocarcinoma. Pt was discharged on 06/06/17.      06/04/2017 Initial Biopsy    Diagnosis 06/04/17 Stomach, biopsy, Fundus - ADENOCARCINOMA - SEE COMMENT Microscopic Comment The stomach biopsy is of an adenocarcinoma arising in a background of intestinal metaplasia. A Warthin-Starry stain is performed to determine the possibility of the presence of Helicobacter pylori. The Warthin-Starry stain is negative for organisms morphologically consistent with Helicobacter pylori.      06/04/2017 Procedure    Esophagogastroduodenoscopy 06/04/17 by Dr. Benson Norway Impression: Normal esophagus. Malignant gastric tumor in the cardia and in the  gastric fundus. Biopsied. Normal examined duodenum.      06/04/2017 Miscellaneous    HER2 (-) EBV (-) MSI-S PD-L1 CPS 5%      06/05/2017 Imaging    CT CAP W Contrast 06/05/17 IMPRESSION: 9.1 cm fungating mass in the gastric cardia/posterior gastric fundus, corresponding to the patient's newly diagnosed gastric Cancer. Associated extra gastric extension with peritoneal disease in the left upper abdomen, including a dominant 4.2 cm peritoneal implant. Small volume pelvic ascites. 1.8 cm hypoenhancing lesion along the inferior spleen,  indeterminate.      06/10/2017 Initial Diagnosis    Gastric cancer ([place])      06/26/2017 -  Chemotherapy    First line chemo FOLFOX every 2 weeks        08/29/2017 Imaging    IMPRESSION: 1. Interval decrease in size of fungating ulcerative mass arising from the posterior gastric fundus. 2. Left peritoneal implant, compatible with peritoneal carcinomatosis, has decreased in size from the previous exam. 3. Right lobe of liver lesion present on previous exam is less conspicuous on today's study. A new lesion is identified within the left lobe of liver, suspicious for metastasis. Additional focus of low attenuation within segment 5 adjacent to the gallbladder fossa may represent focal fatty deposition. 4. Similar appearance of mildly complex cyst containing mural calcification and possible internal septation arising from the inferior pole of right kidney. More definitive characterization of this lesion with renal protocol MRI may be helpful. 5.  Mild prostate gland enlargement. 6.  Aortic Atherosclerosis (ICD10-I70.0).        HISTORY OF PRESENTING ILLNESS:  Ryan Collins 57 y.o. male is a here because of newly diagnosed gastric cancer. The patient was referred by hospitalist after her recent hospital discharge.  The patient presents to the clinic today by himself.   Pt went to urgent care on 05/15/17 with complaints of worsening fatigue, central abdominal pain, black stool for 4 months, and 50 lbs weight loss in the past 6 months. discomfort over the past few months. He described the pain as sharp and dull that is worse after he eats. The pain would last 30-40 minutes after eating. Pt was then seen in the  ED and admitted to the hospital on 06/03/17 with complaints of fatigue and melena. During his stay he underwent an endoscopy that revealed a malignant gastric tumor in the cardia and in the gastric fundus. Biopsy of the mass revealed adenocarcinoma.  Patient received blood transfusion and 1 dose  of IV Feraheme, tolerated well.  His fatigue has much improved.  Pt was dischared on 06/06/17.    Patient is single, no children, lives with his brother in a trailer, he has multiple brothers and sisters and some of them live in the town.  Her mother also lives in [place].  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  He is concerned about his finances if he is not able to continue working.   CURRENT THERAPY: first line chemo FOLFOX every 2 weeks, started on 06/26/2017  INTERIM HISTORY:  Ryan Collins returns for follow-up and Cycle 7 FOLFOX. He presents to the clinic today by himself. He reports he is doing well and tolerating chemotherapy well. He does not experience nausea or diarrhea, just mostly fatigue. He does have pain/abdominal discomfort occassionally but he thinks it is bowel related and/or hernia related. He expresses concerns that he would like to have his hernia repaired.   On review of systems, pt denies any other complaints at this time. Pertinent positives are listed and detailed within the above HPI.   MEDICAL HISTORY:  Past Medical History:  Diagnosis Date  . DVT (deep venous thrombosis) ([place]) 2011  . GI bleeding 2001  . History of DVT of lower extremity   . Melena 05/2017  . Stomach ulcer    Clinically suspected; No EGD confirmation as of 06/03/17    SURGICAL HISTORY: Past Surgical History:  Procedure Laterality Date  . ESOPHAGOGASTRODUODENOSCOPY  06/04/2017  . ESOPHAGOGASTRODUODENOSCOPY N/A 06/04/2017   Procedure: ESOPHAGOGASTRODUODENOSCOPY (EGD);  Surgeon: Carol Ada, MD;  Location: [facility];  Service: Endoscopy;  Laterality: N/A;  . IR FLUORO GUIDE PORT INSERTION RIGHT  06/20/2017  . IR US GUIDE VASC ACCESS RIGHT  06/20/2017    SOCIAL HISTORY: Social History   Socioeconomic History  . Marital status: Legally Separated    Spouse name: Not on file  . Number of children: Not on file  . Years of education: Not on file  . Highest  education level: Not on file  Occupational History  . Not on file  Social Needs  . Financial resource strain: Not on file  . Food insecurity:    Worry: Not on file    Inability: Not on file  . Transportation needs:    Medical: Not on file    Non-medical: Not on file  Tobacco Use  . Smoking status: Never Smoker  . Smokeless tobacco: Never Used  Substance and Sexual Activity  . Alcohol use: Yes    Alcohol/week: 3.0 oz    Types: 5 Cans of beer per week    Comment: 05/2017 i QUIT DRINKING 4 MONTHS AGO "  . Drug use: Yes    Types: Marijuana    Comment: occ  . Sexual activity: Not on file  Lifestyle  . Physical activity:    Days per week: Not on file    Minutes per session: Not on file  . Stress: Not on file  Relationships  . Social connections:    Talks on phone: Not on file    Gets together: Not on file    Attends religious service: Not on file    Active member of club or  organization: Not on file    Attends meetings of clubs or organizations: Not on file    Relationship status: Not on file  . Intimate partner violence:    Fear of current or ex partner: Not on file    Emotionally abused: Not on file    Physically abused: Not on file    Forced sexual activity: Not on file  Other Topics Concern  . Not on file  Social History Narrative  . Not on file    FAMILY HISTORY: Family History  Problem Relation Age of Onset  . Hypertension Mother   . Cancer Sister 22       breast    ALLERGIES:  has No Known Allergies.  MEDICATIONS:  Current Outpatient Medications  Medication Sig Dispense Refill  . acetaminophen (TYLENOL) 500 MG tablet Take 1,000 mg by mouth every 6 (six) hours as needed for mild pain.     Marland Kitchen amLODipine (NORVASC) 5 MG tablet Take 1 tablet (5 mg total) by mouth daily. 30 tablet 5  . Blood Pressure Monitoring (BLOOD PRESSURE MONITOR/M CUFF) MISC 1 each by Does not apply route daily. 1 each 0  . lidocaine-prilocaine (EMLA) cream Apply 1 application topically  as needed (for port).    . ondansetron (ZOFRAN) 8 MG tablet Take 8 mg by mouth 2 (two) times daily as needed for refractory nausea / vomiting. Start on day 3 after chemo    . pantoprazole (PROTONIX) 40 MG tablet Take 1 tablet (40 mg total) by mouth 2 (two) times daily. 60 tablet 0  . prochlorperazine (COMPAZINE) 10 MG tablet Take 10 mg by mouth every 6 (six) hours as needed for nausea or vomiting.    . vitamin B-12 (CYANOCOBALAMIN) 1000 MCG tablet Take 1 tablet (1,000 mcg total) by mouth daily. 30 tablet 0  . thiamine 100 MG tablet Take 1 tablet (100 mg total) by mouth daily. 30 tablet 3   No current facility-administered medications for this visit.     REVIEW OF SYSTEMS:   Constitutional: Denies fevers, chills or abnormal night sweats, (+) over 50 pound weight loss in the past 6 months, improving, (+) fatigue  Eyes: Denies blurriness of vision, double vision or watery eyes Ears, nose, mouth, throat, and face: Denies mucositis or sore throat Respiratory: Denies cough, dyspnea or wheezes Cardiovascular: Denies palpitation, chest discomfort or lower extremity swelling Gastrointestinal:  Denies nausea, heartburn (+) intermittent epigastric pain (+) hernia  Skin: Denies abnormal skin rashes Lymphatics: Denies new lymphadenopathy or easy bruising Neurological:Denies numbness, tingling or new weaknesses Behavioral/Psych: Mood is stable, no new changes  All other systems were reviewed with the patient and are negative.  PHYSICAL EXAMINATION: ECOG PERFORMANCE STATUS: 1 - Symptomatic but completely ambulatory Weight 183 pounds, heart rate 80, respiratory rate 18, temperature 98.4 GENERAL:alert, no distress and comfortable SKIN: skin color, texture, turgor are normal, no rashes or significant lesions EYES: normal, conjunctiva are pink and non-injected, sclera clear OROPHARYNX:no exudate, no erythema and lips, buccal mucosa, and tongue normal  NECK: supple, thyroid normal size, non-tender, without  nodularity LYMPH:  no palpable lymphadenopathy in the cervical, axillary or inguinal LUNGS: clear to auscultation and percussion with normal breathing effort HEART: regular rate & rhythm and no murmurs and no lower extremity edema ABDOMEN:abdomen soft, non-tender and normal bowel sounds (+) right inguinal hernia Musculoskeletal:no cyanosis of digits and no clubbing  PSYCH: alert & oriented x 3 with fluent speech NEURO: no focal motor/sensory deficits  LABORATORY DATA:  I have reviewed the  data as listed CBC Latest Ref Rng & Units 09/18/2017 09/04/2017 08/21/2017  WBC 4.0 - 10.3 K/uL 6.5 7.7 8.5  Hemoglobin 13.0 - 17.1 g/dL 12.1(L) 12.2(L) 11.2(L)  Hematocrit 38.4 - 49.9 % 37.0(L) 37.3(L) 34.5(L)  Platelets 140 - 400 K/uL 184 221 246    CMP Latest Ref Rng & Units 09/04/2017 08/21/2017 08/07/2017  Glucose 70 - 140 mg/dL 96 92 94  BUN 7 - 26 mg/dL _0 Creatinine 0.70 - 1.30 mg/dL 0.90 0.81 0.78  Sodium 136 - 145 mmol/L 138 139 140  Potassium 3.5 - 5.1 mmol/L 4.0 4.1 4.0  Chloride 98 - 109 mmol/L 108 109 110(H)  CO2 22 - 29 mmol/L 20(L) 24 24  Calcium 8.4 - 10.4 mg/dL 9.6 9.1 9.2  Total Protein 6.4 - 8.3 g/dL 7.1 6.7 6.7  Total Bilirubin 0.2 - 1.2 mg/dL 0.4 0.3 0.4  Alkaline Phos 40 - 150 U/L 52 54 53  AST 5 - 34 U/L _1 ALT 0 - 55 U/L _2 CEA 06/13/17: 53.01 07/10/17: 106.44 08/07/17: 31.11 09/04/17: 11.02  PATHOLOGY  Diagnosis 06/04/17 Stomach, biopsy, Fundus - ADENOCARCINOMA - SEE COMMENT Microscopic Comment The stomach biopsy is of an adenocarcinoma arising in a background of intestinal metaplasia. A Warthin-Starry stain is performed to determine the possibility of the presence of Helicobacter pylori. The Warthin-Starry stain is negative for organisms morphologically consistent with Helicobacter pylori.  RADIOGRAPHIC STUDIES: I have personally reviewed the radiological images as listed and agreed with the findings in the report. Ct Chest W Contrast  Result  Date: 08/30/2017 CLINICAL DATA:  Gastric cancer.  Follow-up exam. EXAM: CT CHEST, ABDOMEN, AND PELVIS WITH CONTRAST TECHNIQUE: Multidetector CT imaging of the chest, abdomen and pelvis was performed following the standard protocol during bolus administration of intravenous contrast. CONTRAST:  151m OMNIPAQUE IOHEXOL 300 MG/ML  SOLN COMPARISON:  06/05/2017. FINDINGS: CT CHEST FINDINGS Cardiovascular: Normal heart size. No pericardial effusion. Aortic atherosclerosis. Mediastinum/Nodes: Normal appearance of the thyroid gland. Normal appearance of the esophagus. The trachea appears patent and is midline. No enlarged retroperitoneal or mesenteric adenopathy. No enlarged pelvic or inguinal lymph nodes. Lungs/Pleura: No pleural effusion. Mild bibasilar scarring. There is respiratory motion artifact which diminishes exam detail within the areas of the lower lobes. No suspicious nodule or mass noted. Musculoskeletal: Degenerative disc disease noted within the lower thoracic spine. No suspicious bone lesions identified. CT ABDOMEN PELVIS FINDINGS Hepatobiliary: Small focal area of low attenuation within segment 5 adjacent to the gallbladder fossa is favored to represent an area of focal fatty deposition. A new low-attenuation structure with mild peripheral enhancement is identified within the lateral segment of left lobe measuring 1.2 cm. Suspicious for metastasis. Previously noted segment 7 liver lesion is less conspicuous on today's study measuring approximately 8 mm. Previously 1 cm. Gallbladder negative. No biliary dilatation. Pancreas: Unremarkable. No pancreatic ductal dilatation or surrounding inflammatory changes. Spleen: Area of low attenuation within the inferior spleen measures 8 mm, image 65/2. Previously 1.5 cm. No new splenic abnormality. Adrenals/Urinary Tract: The adrenal glands are normal. Normal appearance of the left kidney. The previously characterized complex cystic lesion arising from the inferior pole  of right kidney containing small focus of mural calcification with possible internal septation is unchanged measuring 1.9 cm. This does not measure simple fluid attenuation with Hounsfield units of 22 HU postcontrast, image 85/2. Urinary bladder is normal. Stomach/Bowel: Ulcerative mass arising from the posterior gastric fundus is decreased in size from previous  exam. Currently this measures approximately 2.9 by 5.5 by 2.1 cm, image 72/5. Previously this measured 6.5 x 6.5 by 5.3 cm. No gastric outlet obstruction. The small bowel loops have a normal caliber. There is no pathologic dilatation of the colon. Vascular/Lymphatic: Aortic atherosclerosis without aneurysm. No adenopathy within the abdomen or pelvis. Reproductive: Prostate gland Measures 5.3 by 3.8 by 4.3 cm (volume = 45 cm^3). Other: No ascites within the abdomen or pelvis. Along the left pericolic gutter there is a peritoneal nodule measuring 9 mm, image 68/2, previously 1.5 cm. No additional sites of peritoneal disease identified. Musculoskeletal: No suspicious bone lesions. IMPRESSION: 1. Interval decrease in size of fungating ulcerative mass arising from the posterior gastric fundus. 2. Left peritoneal implant, compatible with peritoneal carcinomatosis, has decreased in size from the previous exam. 3. Right lobe of liver lesion present on previous exam is less conspicuous on today's study. A new lesion is identified within the left lobe of liver, suspicious for metastasis. Additional focus of low attenuation within segment 5 adjacent to the gallbladder fossa may represent focal fatty deposition. 4. Similar appearance of mildly complex cyst containing mural calcification and possible internal septation arising from the inferior pole of right kidney. More definitive characterization of this lesion with renal protocol MRI may be helpful. 5.  Mild prostate gland enlargement. 6.  Aortic Atherosclerosis (ICD10-I70.0). Electronically Signed   By: Kerby Moors M.D.   On: 08/30/2017 11:51   Ct Abdomen Pelvis W Contrast  Result Date: 08/30/2017 CLINICAL DATA:  Gastric cancer.  Follow-up exam. EXAM: CT CHEST, ABDOMEN, AND PELVIS WITH CONTRAST TECHNIQUE: Multidetector CT imaging of the chest, abdomen and pelvis was performed following the standard protocol during bolus administration of intravenous contrast. CONTRAST:  119m OMNIPAQUE IOHEXOL 300 MG/ML  SOLN COMPARISON:  06/05/2017. FINDINGS: CT CHEST FINDINGS Cardiovascular: Normal heart size. No pericardial effusion. Aortic atherosclerosis. Mediastinum/Nodes: Normal appearance of the thyroid gland. Normal appearance of the esophagus. The trachea appears patent and is midline. No enlarged retroperitoneal or mesenteric adenopathy. No enlarged pelvic or inguinal lymph nodes. Lungs/Pleura: No pleural effusion. Mild bibasilar scarring. There is respiratory motion artifact which diminishes exam detail within the areas of the lower lobes. No suspicious nodule or mass noted. Musculoskeletal: Degenerative disc disease noted within the lower thoracic spine. No suspicious bone lesions identified. CT ABDOMEN PELVIS FINDINGS Hepatobiliary: Small focal area of low attenuation within segment 5 adjacent to the gallbladder fossa is favored to represent an area of focal fatty deposition. A new low-attenuation structure with mild peripheral enhancement is identified within the lateral segment of left lobe measuring 1.2 cm. Suspicious for metastasis. Previously noted segment 7 liver lesion is less conspicuous on today's study measuring approximately 8 mm. Previously 1 cm. Gallbladder negative. No biliary dilatation. Pancreas: Unremarkable. No pancreatic ductal dilatation or surrounding inflammatory changes. Spleen: Area of low attenuation within the inferior spleen measures 8 mm, image 65/2. Previously 1.5 cm. No new splenic abnormality. Adrenals/Urinary Tract: The adrenal glands are normal. Normal appearance of the left kidney. The  previously characterized complex cystic lesion arising from the inferior pole of right kidney containing small focus of mural calcification with possible internal septation is unchanged measuring 1.9 cm. This does not measure simple fluid attenuation with Hounsfield units of 22 HU postcontrast, image 85/2. Urinary bladder is normal. Stomach/Bowel: Ulcerative mass arising from the posterior gastric fundus is decreased in size from previous exam. Currently this measures approximately 2.9 by 5.5 by 2.1 cm, image 72/5. Previously this measured 6.5 x  6.5 by 5.3 cm. No gastric outlet obstruction. The small bowel loops have a normal caliber. There is no pathologic dilatation of the colon. Vascular/Lymphatic: Aortic atherosclerosis without aneurysm. No adenopathy within the abdomen or pelvis. Reproductive: Prostate gland Measures 5.3 by 3.8 by 4.3 cm (volume = 45 cm^3). Other: No ascites within the abdomen or pelvis. Along the left pericolic gutter there is a peritoneal nodule measuring 9 mm, image 68/2, previously 1.5 cm. No additional sites of peritoneal disease identified. Musculoskeletal: No suspicious bone lesions. IMPRESSION: 1. Interval decrease in size of fungating ulcerative mass arising from the posterior gastric fundus. 2. Left peritoneal implant, compatible with peritoneal carcinomatosis, has decreased in size from the previous exam. 3. Right lobe of liver lesion present on previous exam is less conspicuous on today's study. A new lesion is identified within the left lobe of liver, suspicious for metastasis. Additional focus of low attenuation within segment 5 adjacent to the gallbladder fossa may represent focal fatty deposition. 4. Similar appearance of mildly complex cyst containing mural calcification and possible internal septation arising from the inferior pole of right kidney. More definitive characterization of this lesion with renal protocol MRI may be helpful. 5.  Mild prostate gland enlargement. 6.   Aortic Atherosclerosis (ICD10-I70.0). Electronically Signed   By: Kerby Moors M.D.   On: 08/30/2017 11:51   CT CAP W Contrast 06/05/17 IMPRESSION: 9.1 cm fungating mass in the gastric cardia/posterior gastric fundus, corresponding to the patient's newly diagnosed gastric Cancer. Associated extra gastric extension with peritoneal disease in the left upper abdomen, including a dominant 4.2 cm peritoneal implant. Small volume pelvic ascites. 1.8 cm hypoenhancing lesion along the inferior spleen, indeterminate.  PROCEDURE  Esophagogastroduodenoscopy 06/04/17 by Dr. Benson Norway Findings: The esophagus was normal. A large, fungating and ulcerated, non-circumferential mass with no bleeding and no stigmata of recent bleeding was found in the cardia and in the gastric fundus. Biopsies were taken with a cold forceps for histology. The examined duodenum was normal. A large friable fungating gastric mass involving the cardia and the fundus was identified. The mass extended approximately 5 cm. It was difficult to measure the width of the lesion. There was a large necrotic and deep ulcer in the mid portion of the mass. It is estimated that it encompasses 25% of the fundic circumfirence. Multiple cold biopsies were obtained. Impression: Normal esophagus. Malignant gastric tumor in the cardia and in the  gastric fundus. Biopsied. Normal examined duodenum.  ASSESSMENT & PLAN:  Ryan Collins 57 y.o. male, without significant past medical history, but also does not  see doctors routinely, presented with worsening fatigue, intermittent epigastric pain and melena for 3-4 months, and 50 pounds weight loss over the past 6 months.  1.  Gastric cancer, [id number] with peritoneal metastasis, adenocarcinoma, HER2(-), MSI-stable, PD-L1positive (5) -I previously reviewed his CT scan findings, endoscopy and biopsy results of the gastric mass with patient in details. He has a large mass in the cardia and in the gastric fundus, with  evidence of extra gastric extension, and the peritoneum metastasis on CT scan.  No other distant metastasis. -Due to the peritoneal metastasis, unfortunately his cancer is not curable at this stage. We previously discussed the role of HIPEC if he has excellent response to chemotherapy, and no evidence of other metastasis, I may consider refer him to Dr. Lennie Odor in the future. Although due to the aggressive nature of gastric cancer, HIPEC is usually not offered.  -He started first-line chemotherapy with FOLFOX on 06/26/17, he has  been tolerating well so far. -Goal of therapy is palliative. -His tumor is negative for HER-2, MSI-stable, PD-L1positive (5).  He would be a candidate for immunotherapy as third line -His tumor marker has been trending down, CEA 11.02 on 09/04/17.  -His CT CAP W Contrast from 08/29/17 revealed a good partial response except one indeterminate lesion in left lobe of liver. I have personally reviewed his recent restaging CT, and discussed with radiologist Dr. [name]. I agree with his interpretation. I recommended continuing chemo at that time. Pt agreed. -labs reviewed, adequate for Cycle 7 today -he has a right inguinal hernia onset in Jan 2019 and expressed interest in having it repaired. I will discuss with Dr. Barry Dienes. I would like him to stay on chemo for a little while before giving him a break for hernia repair. I discussed that this surgery may be more difficult for someone with cancer.  -I discussed that immunotherapy will be an option for him in the future.  -F/u in 4 weeks  -Plan to repeat scan in mid or late June.  2.  Anemia of iron deficiency and tumor bleeding, B12 deficiency -He has clinical GI bleeding with melena, iron study also showed iron deficiency. -He has received blood transfusion, and 2 dose of IV Feraheme, repeated iron level was adequate.   -We continue to monitor his CBC and iron studies closely -He will continue Oral-B complex   3. History of DVT, ?   Protein C deficiency -he was on coumadin for his DVT before, and previous labs showed a protein C deficiency, which increases his risk of thrombosis. -We previously discussed the high risk of thrombosis secondary to his underlying malignancy. -Due to his chronic GI bleeding from his tumor, I would not do prophylactic anticoagulation.  4. History of alcohol and marijuana abuse -He has quit drinking alcohol since his diagnosis of cancer -He does smoke marijuana occasionally  5. Weight loss and malnutrition  -He has lost significant weight since the diagnosis -Encouraged him to increase his nutrition supplement.  He has been seen by our dietitian, will follow-up next week. -He is gaining weight now   6. Goal of care discussion  -We previously discussed the incurable nature of his cancer, and the overall poor prognosis, especially if he does not have good response to chemotherapy or progress on chemo -The patient understands the goal of care is palliative. -he is full code now   7.  Right inguinal hernia -I will refer him to Dr. Barry Dienes or her partner to consider surgery  No orders of the defined types were placed in this encounter.   PLAN: -Labs reviewed, proceed with cycle 7 FOLFOX today, continue q2 weeks -Sent message to Dr. Barry Dienes today about surgery on his inguinal hernia  -Lab and f/u in 4 weeks   All questions were answered. The patient knows to call the clinic with any problems, questions or concerns. I spent 20 minutes counseling the patient face to face. The total time spent in the appointment was 25 minutes and more than 50% was on counseling.  This document serves as a record of services personally performed by [name], MD. It was created on her behalf by Theresia Bough, a trained medical scribe. The creation of this record is based on the scribe's personal observations and the provider's statements to them.   I have reviewed the above documentation for accuracy and  completeness, and I agree with the above.    [name], MD 09/18/2017 9:18 AM

## 2017-09-18 ENCOUNTER — Inpatient Hospital Stay: Payer: BLUE CROSS/BLUE SHIELD

## 2017-09-18 ENCOUNTER — Inpatient Hospital Stay (HOSPITAL_BASED_OUTPATIENT_CLINIC_OR_DEPARTMENT_OTHER): Payer: BLUE CROSS/BLUE SHIELD | Admitting: Hematology

## 2017-09-18 ENCOUNTER — Encounter: Payer: Self-pay | Admitting: Hematology

## 2017-09-18 ENCOUNTER — Inpatient Hospital Stay: Payer: BLUE CROSS/BLUE SHIELD | Attending: Hematology

## 2017-09-18 VITALS — BP 139/82 | HR 82 | Temp 98.4°F | Resp 18 | Wt 183.1 lb

## 2017-09-18 DIAGNOSIS — E538 Deficiency of other specified B group vitamins: Secondary | ICD-10-CM | POA: Insufficient documentation

## 2017-09-18 DIAGNOSIS — K760 Fatty (change of) liver, not elsewhere classified: Secondary | ICD-10-CM

## 2017-09-18 DIAGNOSIS — K922 Gastrointestinal hemorrhage, unspecified: Secondary | ICD-10-CM | POA: Insufficient documentation

## 2017-09-18 DIAGNOSIS — D500 Iron deficiency anemia secondary to blood loss (chronic): Secondary | ICD-10-CM

## 2017-09-18 DIAGNOSIS — K4090 Unilateral inguinal hernia, without obstruction or gangrene, not specified as recurrent: Secondary | ICD-10-CM | POA: Diagnosis not present

## 2017-09-18 DIAGNOSIS — Z5111 Encounter for antineoplastic chemotherapy: Secondary | ICD-10-CM | POA: Insufficient documentation

## 2017-09-18 DIAGNOSIS — Z86718 Personal history of other venous thrombosis and embolism: Secondary | ICD-10-CM | POA: Diagnosis not present

## 2017-09-18 DIAGNOSIS — R634 Abnormal weight loss: Secondary | ICD-10-CM | POA: Insufficient documentation

## 2017-09-18 DIAGNOSIS — C161 Malignant neoplasm of fundus of stomach: Secondary | ICD-10-CM | POA: Insufficient documentation

## 2017-09-18 DIAGNOSIS — Z79899 Other long term (current) drug therapy: Secondary | ICD-10-CM

## 2017-09-18 DIAGNOSIS — C786 Secondary malignant neoplasm of retroperitoneum and peritoneum: Secondary | ICD-10-CM | POA: Diagnosis not present

## 2017-09-18 DIAGNOSIS — E46 Unspecified protein-calorie malnutrition: Secondary | ICD-10-CM

## 2017-09-18 DIAGNOSIS — R5383 Other fatigue: Secondary | ICD-10-CM | POA: Insufficient documentation

## 2017-09-18 DIAGNOSIS — K921 Melena: Secondary | ICD-10-CM

## 2017-09-18 LAB — CMP (CANCER CENTER ONLY)
ALK PHOS: 58 U/L (ref 40–150)
ALT: 26 U/L (ref 0–55)
ANION GAP: 6 (ref 3–11)
AST: 22 U/L (ref 5–34)
Albumin: 3.7 g/dL (ref 3.5–5.0)
BUN: 9 mg/dL (ref 7–26)
CALCIUM: 9.2 mg/dL (ref 8.4–10.4)
CO2: 26 mmol/L (ref 22–29)
CREATININE: 0.95 mg/dL (ref 0.70–1.30)
Chloride: 107 mmol/L (ref 98–109)
Glucose, Bld: 93 mg/dL (ref 70–140)
Potassium: 4 mmol/L (ref 3.5–5.1)
SODIUM: 139 mmol/L (ref 136–145)
Total Bilirubin: 0.4 mg/dL (ref 0.2–1.2)
Total Protein: 6.9 g/dL (ref 6.4–8.3)

## 2017-09-18 LAB — CBC WITH DIFFERENTIAL (CANCER CENTER ONLY)
BASOS ABS: 0 10*3/uL (ref 0.0–0.1)
BASOS PCT: 1 %
EOS ABS: 0.3 10*3/uL (ref 0.0–0.5)
EOS PCT: 5 %
HCT: 37 % — ABNORMAL LOW (ref 38.4–49.9)
Hemoglobin: 12.1 g/dL — ABNORMAL LOW (ref 13.0–17.1)
LYMPHS ABS: 2.4 10*3/uL (ref 0.9–3.3)
Lymphocytes Relative: 36 %
MCH: 27.9 pg (ref 27.2–33.4)
MCHC: 32.7 g/dL (ref 32.0–36.0)
MCV: 85.5 fL (ref 79.3–98.0)
Monocytes Absolute: 0.9 10*3/uL (ref 0.1–0.9)
Monocytes Relative: 14 %
Neutro Abs: 2.9 10*3/uL (ref 1.5–6.5)
Neutrophils Relative %: 44 %
PLATELETS: 184 10*3/uL (ref 140–400)
RBC: 4.33 MIL/uL (ref 4.20–5.82)
RDW: 20.9 % — ABNORMAL HIGH (ref 11.0–14.6)
WBC: 6.5 10*3/uL (ref 4.0–10.3)

## 2017-09-18 MED ORDER — FLUOROURACIL CHEMO INJECTION 2.5 GM/50ML
400.0000 mg/m2 | Freq: Once | INTRAVENOUS | Status: AC
  Administered 2017-09-18: 800 mg via INTRAVENOUS
  Filled 2017-09-18: qty 16

## 2017-09-18 MED ORDER — OXALIPLATIN CHEMO INJECTION 100 MG/20ML
85.0000 mg/m2 | Freq: Once | INTRAVENOUS | Status: AC
  Administered 2017-09-18: 165 mg via INTRAVENOUS
  Filled 2017-09-18: qty 33

## 2017-09-18 MED ORDER — PALONOSETRON HCL INJECTION 0.25 MG/5ML
INTRAVENOUS | Status: AC
  Filled 2017-09-18: qty 5

## 2017-09-18 MED ORDER — SODIUM CHLORIDE 0.9% FLUSH
10.0000 mL | INTRAVENOUS | Status: DC | PRN
  Administered 2017-09-18: 10 mL via INTRAVENOUS
  Filled 2017-09-18: qty 10

## 2017-09-18 MED ORDER — DEXAMETHASONE SODIUM PHOSPHATE 10 MG/ML IJ SOLN
10.0000 mg | Freq: Once | INTRAMUSCULAR | Status: AC
  Administered 2017-09-18: 10 mg via INTRAVENOUS

## 2017-09-18 MED ORDER — SODIUM CHLORIDE 0.9 % IV SOLN
2400.0000 mg/m2 | INTRAVENOUS | Status: DC
  Administered 2017-09-18: 4700 mg via INTRAVENOUS
  Filled 2017-09-18: qty 94

## 2017-09-18 MED ORDER — DEXAMETHASONE SODIUM PHOSPHATE 10 MG/ML IJ SOLN
INTRAMUSCULAR | Status: AC
  Filled 2017-09-18: qty 1

## 2017-09-18 MED ORDER — HEPARIN SOD (PORK) LOCK FLUSH 100 UNIT/ML IV SOLN
500.0000 [IU] | Freq: Once | INTRAVENOUS | Status: DC | PRN
  Filled 2017-09-18: qty 5

## 2017-09-18 MED ORDER — SODIUM CHLORIDE 0.9% FLUSH
10.0000 mL | INTRAVENOUS | Status: DC | PRN
  Filled 2017-09-18: qty 10

## 2017-09-18 MED ORDER — DEXTROSE 5 % IV SOLN
Freq: Once | INTRAVENOUS | Status: AC
  Administered 2017-09-18: 10:00:00 via INTRAVENOUS

## 2017-09-18 MED ORDER — PALONOSETRON HCL INJECTION 0.25 MG/5ML
0.2500 mg | Freq: Once | INTRAVENOUS | Status: AC
  Administered 2017-09-18: 0.25 mg via INTRAVENOUS

## 2017-09-18 MED ORDER — LEUCOVORIN CALCIUM INJECTION 350 MG
400.0000 mg/m2 | Freq: Once | INTRAVENOUS | Status: AC
  Administered 2017-09-18: 780 mg via INTRAVENOUS
  Filled 2017-09-18: qty 39

## 2017-09-18 NOTE — Patient Instructions (Signed)
[facility] Discharge Instructions for Patients Receiving Chemotherapy  Today you received the following chemotherapy agents Oxaliplatin, Leucovorin, Fluorouracil.   To help prevent nausea and vomiting after your treatment, we encourage you to take your nausea medication as directed.  If you develop nausea and vomiting that is not controlled by your nausea medication, call the clinic.   BELOW ARE SYMPTOMS THAT SHOULD BE REPORTED IMMEDIATELY:  *FEVER GREATER THAN 100.5 F  *CHILLS WITH OR WITHOUT FEVER  NAUSEA AND VOMITING THAT IS NOT CONTROLLED WITH YOUR NAUSEA MEDICATION  *UNUSUAL SHORTNESS OF BREATH  *UNUSUAL BRUISING OR BLEEDING  TENDERNESS IN MOUTH AND THROAT WITH OR WITHOUT PRESENCE OF ULCERS  *URINARY PROBLEMS  *BOWEL PROBLEMS  UNUSUAL RASH Items with * indicate a potential emergency and should be followed up as soon as possible.  Feel free to call the clinic should you have any questions or concerns. The clinic phone number is [phone].  Please show the CHEMO ALERT CARD at check-in to the Emergency Department and triage nurse.   

## 2017-09-19 ENCOUNTER — Telehealth: Payer: Self-pay | Admitting: Hematology

## 2017-09-19 NOTE — Telephone Encounter (Signed)
Appointments scheduled letter/calendar mailed per 5/9 los

## 2017-09-19 NOTE — Telephone Encounter (Signed)
Appointments scheduled AVS/Calendar printed per 5/9 los °

## 2017-09-20 ENCOUNTER — Inpatient Hospital Stay: Payer: BLUE CROSS/BLUE SHIELD

## 2017-09-20 VITALS — BP 140/80 | HR 75 | Temp 98.1°F | Resp 18

## 2017-09-20 DIAGNOSIS — C161 Malignant neoplasm of fundus of stomach: Secondary | ICD-10-CM

## 2017-09-20 MED ORDER — SODIUM CHLORIDE 0.9% FLUSH
10.0000 mL | INTRAVENOUS | Status: DC | PRN
  Administered 2017-09-20: 10 mL
  Filled 2017-09-20: qty 10

## 2017-09-20 MED ORDER — HEPARIN SOD (PORK) LOCK FLUSH 100 UNIT/ML IV SOLN
500.0000 [IU] | Freq: Once | INTRAVENOUS | Status: AC | PRN
  Administered 2017-09-20: 500 [IU]
  Filled 2017-09-20: qty 5

## 2017-10-02 ENCOUNTER — Telehealth: Payer: Self-pay | Admitting: Hematology

## 2017-10-02 ENCOUNTER — Encounter: Payer: Self-pay | Admitting: Nurse Practitioner

## 2017-10-02 ENCOUNTER — Inpatient Hospital Stay (HOSPITAL_BASED_OUTPATIENT_CLINIC_OR_DEPARTMENT_OTHER): Payer: BLUE CROSS/BLUE SHIELD | Admitting: Nurse Practitioner

## 2017-10-02 ENCOUNTER — Inpatient Hospital Stay: Payer: BLUE CROSS/BLUE SHIELD

## 2017-10-02 VITALS — BP 120/81 | HR 80 | Temp 97.9°F | Resp 18 | Ht 71.5 in | Wt 180.0 lb

## 2017-10-02 DIAGNOSIS — C161 Malignant neoplasm of fundus of stomach: Secondary | ICD-10-CM

## 2017-10-02 DIAGNOSIS — R634 Abnormal weight loss: Secondary | ICD-10-CM | POA: Diagnosis not present

## 2017-10-02 DIAGNOSIS — E538 Deficiency of other specified B group vitamins: Secondary | ICD-10-CM | POA: Diagnosis not present

## 2017-10-02 DIAGNOSIS — K760 Fatty (change of) liver, not elsewhere classified: Secondary | ICD-10-CM

## 2017-10-02 DIAGNOSIS — D500 Iron deficiency anemia secondary to blood loss (chronic): Secondary | ICD-10-CM

## 2017-10-02 DIAGNOSIS — K921 Melena: Secondary | ICD-10-CM

## 2017-10-02 DIAGNOSIS — E46 Unspecified protein-calorie malnutrition: Secondary | ICD-10-CM | POA: Diagnosis not present

## 2017-10-02 DIAGNOSIS — K922 Gastrointestinal hemorrhage, unspecified: Secondary | ICD-10-CM

## 2017-10-02 DIAGNOSIS — Z86718 Personal history of other venous thrombosis and embolism: Secondary | ICD-10-CM

## 2017-10-02 DIAGNOSIS — Z79899 Other long term (current) drug therapy: Secondary | ICD-10-CM | POA: Diagnosis not present

## 2017-10-02 DIAGNOSIS — C169 Malignant neoplasm of stomach, unspecified: Secondary | ICD-10-CM

## 2017-10-02 DIAGNOSIS — K4090 Unilateral inguinal hernia, without obstruction or gangrene, not specified as recurrent: Secondary | ICD-10-CM

## 2017-10-02 DIAGNOSIS — C786 Secondary malignant neoplasm of retroperitoneum and peritoneum: Secondary | ICD-10-CM

## 2017-10-02 DIAGNOSIS — R5383 Other fatigue: Secondary | ICD-10-CM

## 2017-10-02 LAB — CMP (CANCER CENTER ONLY)
ALBUMIN: 3.8 g/dL (ref 3.5–5.0)
ALK PHOS: 60 U/L (ref 40–150)
ALT: 13 U/L (ref 0–55)
AST: 18 U/L (ref 5–34)
Anion gap: 7 (ref 3–11)
BUN: 9 mg/dL (ref 7–26)
CO2: 22 mmol/L (ref 22–29)
CREATININE: 0.86 mg/dL (ref 0.70–1.30)
Calcium: 8.8 mg/dL (ref 8.4–10.4)
Chloride: 108 mmol/L (ref 98–109)
GFR, Est AFR Am: >60 mL/min (ref >60)
GLUCOSE: 98 mg/dL (ref 70–140)
POTASSIUM: 3.9 mmol/L (ref 3.5–5.1)
Sodium: 137 mmol/L (ref 136–145)
TOTAL PROTEIN: 7 g/dL (ref 6.4–8.3)
Total Bilirubin: 0.4 mg/dL (ref 0.2–1.2)

## 2017-10-02 LAB — CBC WITH DIFFERENTIAL (CANCER CENTER ONLY)
BASOS ABS: 0 10*3/uL (ref 0.0–0.1)
Basophils Relative: 1 %
EOS PCT: 2 %
Eosinophils Absolute: 0.1 10*3/uL (ref 0.0–0.5)
HEMATOCRIT: 36.1 % — AB (ref 38.4–49.9)
HEMOGLOBIN: 12 g/dL — AB (ref 13.0–17.1)
LYMPHS PCT: 29 %
Lymphs Abs: 1.5 10*3/uL (ref 0.9–3.3)
MCH: 29.1 pg (ref 27.2–33.4)
MCHC: 33.3 g/dL (ref 32.0–36.0)
MCV: 87.5 fL (ref 79.3–98.0)
Monocytes Absolute: 1.1 10*3/uL — ABNORMAL HIGH (ref 0.1–0.9)
Monocytes Relative: 20 %
NEUTROS PCT: 48 %
Neutro Abs: 2.5 10*3/uL (ref 1.5–6.5)
Platelet Count: 175 10*3/uL (ref 140–400)
RBC: 4.13 MIL/uL — ABNORMAL LOW (ref 4.20–5.82)
RDW: 18.4 % — AB (ref 11.0–14.6)
WBC: 5.2 10*3/uL (ref 4.0–10.3)

## 2017-10-02 LAB — IRON AND TIBC
Iron: 78 ug/dL (ref 42–163)
Saturation Ratios: 31 % — ABNORMAL LOW (ref 42–163)
TIBC: 251 ug/dL (ref 202–409)
UIBC: 173 ug/dL

## 2017-10-02 LAB — FERRITIN: Ferritin: 312 ng/mL (ref 22–316)

## 2017-10-02 LAB — CEA (IN HOUSE-CHCC): CEA (CHCC-IN HOUSE): 5.64 ng/mL — AB (ref 0.00–5.00)

## 2017-10-02 MED ORDER — DEXAMETHASONE SODIUM PHOSPHATE 10 MG/ML IJ SOLN
INTRAMUSCULAR | Status: AC
  Filled 2017-10-02: qty 1

## 2017-10-02 MED ORDER — OXALIPLATIN CHEMO INJECTION 100 MG/20ML
85.0000 mg/m2 | Freq: Once | INTRAVENOUS | Status: AC
  Administered 2017-10-02: 165 mg via INTRAVENOUS
  Filled 2017-10-02: qty 33

## 2017-10-02 MED ORDER — PALONOSETRON HCL INJECTION 0.25 MG/5ML
INTRAVENOUS | Status: AC
  Filled 2017-10-02: qty 5

## 2017-10-02 MED ORDER — PALONOSETRON HCL INJECTION 0.25 MG/5ML
0.2500 mg | Freq: Once | INTRAVENOUS | Status: AC
  Administered 2017-10-02: 0.25 mg via INTRAVENOUS

## 2017-10-02 MED ORDER — DEXAMETHASONE SODIUM PHOSPHATE 10 MG/ML IJ SOLN
10.0000 mg | Freq: Once | INTRAMUSCULAR | Status: AC
  Administered 2017-10-02: 10 mg via INTRAVENOUS

## 2017-10-02 MED ORDER — SODIUM CHLORIDE 0.9 % IV SOLN
2400.0000 mg/m2 | INTRAVENOUS | Status: DC
  Administered 2017-10-02: 4700 mg via INTRAVENOUS
  Filled 2017-10-02: qty 94

## 2017-10-02 MED ORDER — LEUCOVORIN CALCIUM INJECTION 350 MG
400.0000 mg/m2 | Freq: Once | INTRAVENOUS | Status: AC
  Administered 2017-10-02: 780 mg via INTRAVENOUS
  Filled 2017-10-02: qty 39

## 2017-10-02 MED ORDER — FLUOROURACIL CHEMO INJECTION 2.5 GM/50ML
400.0000 mg/m2 | Freq: Once | INTRAVENOUS | Status: AC
  Administered 2017-10-02: 800 mg via INTRAVENOUS
  Filled 2017-10-02: qty 16

## 2017-10-02 MED ORDER — DEXTROSE 5 % IV SOLN
Freq: Once | INTRAVENOUS | Status: AC
  Administered 2017-10-02: 11:00:00 via INTRAVENOUS

## 2017-10-02 MED ORDER — SODIUM CHLORIDE 0.9% FLUSH
10.0000 mL | INTRAVENOUS | Status: DC | PRN
  Administered 2017-10-02: 10 mL via INTRAVENOUS
  Filled 2017-10-02: qty 10

## 2017-10-02 NOTE — Telephone Encounter (Signed)
Appointment scheduled AVS/Calendar printed per 5/22 los

## 2017-10-02 NOTE — Patient Instructions (Signed)
Implanted Port Home Guide An implanted port is a type of central line that is placed under the skin. Central lines are used to provide IV access when treatment or nutrition needs to be given through a person's veins. Implanted ports are used for long-term IV access. An implanted port may be placed because:  You need IV medicine that would be irritating to the small veins in your hands or arms.  You need long-term IV medicines, such as antibiotics.  You need IV nutrition for a long period.  You need frequent blood draws for lab tests.  You need dialysis.  Implanted ports are usually placed in the chest area, but they can also be placed in the upper arm, the abdomen, or the leg. An implanted port has two main parts:  Reservoir. The reservoir is round and will appear as a small, raised area under your skin. The reservoir is the part where a needle is inserted to give medicines or draw blood.  Catheter. The catheter is a thin, flexible tube that extends from the reservoir. The catheter is placed into a large vein. Medicine that is inserted into the reservoir goes into the catheter and then into the vein.  How will I care for my incision site? Do not get the incision site wet. Bathe or shower as directed by your health care provider. How is my port accessed? Special steps must be taken to access the port:  Before the port is accessed, a numbing cream can be placed on the skin. This helps numb the skin over the port site.  Your health care provider uses a sterile technique to access the port. ? Your health care provider must put on a mask and sterile gloves. ? The skin over your port is cleaned carefully with an antiseptic and allowed to dry. ? The port is gently pinched between sterile gloves, and a needle is inserted into the port.  Only "non-coring" port needles should be used to access the port. Once the port is accessed, a blood return should be checked. This helps ensure that the port  is in the vein and is not clogged.  If your port needs to remain accessed for a constant infusion, a clear (transparent) bandage will be placed over the needle site. The bandage and needle will need to be changed every week, or as directed by your health care provider.  Keep the bandage covering the needle clean and dry. Do not get it wet. Follow your health care provider's instructions on how to take a shower or bath while the port is accessed.  If your port does not need to stay accessed, no bandage is needed over the port.  What is flushing? Flushing helps keep the port from getting clogged. Follow your health care provider's instructions on how and when to flush the port. Ports are usually flushed with saline solution or a medicine called heparin. The need for flushing will depend on how the port is used.  If the port is used for intermittent medicines or blood draws, the port will need to be flushed: ? After medicines have been given. ? After blood has been drawn. ? As part of routine maintenance.  If a constant infusion is running, the port may not need to be flushed.  How long will my port stay implanted? The port can stay in for as long as your health care provider thinks it is needed. When it is time for the port to come out, surgery will be   done to remove it. The procedure is similar to the one performed when the port was put in. When should I seek immediate medical care? When you have an implanted port, you should seek immediate medical care if:  You notice a bad smell coming from the incision site.  You have swelling, redness, or drainage at the incision site.  You have more swelling or pain at the port site or the surrounding area.  You have a fever that is not controlled with medicine.  This information is not intended to replace advice given to you by your health care provider. Make sure you discuss any questions you have with your health care provider. Document  Released: 04/30/2005 Document Revised: 10/06/2015 Document Reviewed: 01/05/2013 Elsevier Interactive Patient Education  2017 [employer]  

## 2017-10-02 NOTE — Patient Instructions (Signed)
[facility] Discharge Instructions for Patients Receiving Chemotherapy  Today you received the following chemotherapy agents Oxaliplatin, Leucovorin, Fluorouracil.   To help prevent nausea and vomiting after your treatment, we encourage you to take your nausea medication as directed.  If you develop nausea and vomiting that is not controlled by your nausea medication, call the clinic.   BELOW ARE SYMPTOMS THAT SHOULD BE REPORTED IMMEDIATELY:  *FEVER GREATER THAN 100.5 F  *CHILLS WITH OR WITHOUT FEVER  NAUSEA AND VOMITING THAT IS NOT CONTROLLED WITH YOUR NAUSEA MEDICATION  *UNUSUAL SHORTNESS OF BREATH  *UNUSUAL BRUISING OR BLEEDING  TENDERNESS IN MOUTH AND THROAT WITH OR WITHOUT PRESENCE OF ULCERS  *URINARY PROBLEMS  *BOWEL PROBLEMS  UNUSUAL RASH Items with * indicate a potential emergency and should be followed up as soon as possible.  Feel free to call the clinic should you have any questions or concerns. The clinic phone number is [phone].  Please show the CHEMO ALERT CARD at check-in to the Emergency Department and triage nurse.   

## 2017-10-02 NOTE — Progress Notes (Signed)
[place]  [phone] [phone]  Clinic Follow up Note   Patient Care Team: [name], FNP as PCP - General (Family Medicine) 10/02/2017  SUMMARY OF ONCOLOGIC HISTORY: Oncology History   Cancer Staging Gastric cancer [facility]) Staging form: Stomach, AJCC 8th Edition - Clinical stage from 06/04/2017: Stage IVB (cT4, cN0, cM1) - Signed by [name], MD on 06/10/2017       Gastric cancer ([place])   06/03/2017 - 06/06/2017 Hospital Admission    Admitted to the hospital on 06/03/17 with complaints of fatigue and melena. During his stay he underwent an endoscopy that revealed a malignant gastric tumor in the cardia and in the gastric fundus. Biopsy results revealed adenocarcinoma. Pt was discharged on 06/06/17.      06/04/2017 Initial Biopsy    Diagnosis 06/04/17 Stomach, biopsy, Fundus - ADENOCARCINOMA - SEE COMMENT Microscopic Comment The stomach biopsy is of an adenocarcinoma arising in a background of intestinal metaplasia. A Warthin-Starry stain is performed to determine the possibility of the presence of Helicobacter pylori. The Warthin-Starry stain is negative for organisms morphologically consistent with Helicobacter pylori.      06/04/2017 Procedure    Esophagogastroduodenoscopy 06/04/17 by Dr. Benson Norway Impression: Normal esophagus. Malignant gastric tumor in the cardia and in the  gastric fundus. Biopsied. Normal examined duodenum.      06/04/2017 Miscellaneous    HER2 (-) EBV (-) MSI-S PD-L1 CPS 5%      06/05/2017 Imaging    CT CAP W Contrast 06/05/17 IMPRESSION: 9.1 cm fungating mass in the gastric cardia/posterior gastric fundus, corresponding to the patient's newly diagnosed gastric Cancer. Associated extra gastric extension with peritoneal disease in the left upper abdomen, including a dominant 4.2 cm peritoneal implant. Small volume pelvic ascites. 1.8 cm hypoenhancing lesion along the inferior spleen, indeterminate.      06/10/2017 Initial Diagnosis    Gastric cancer ([place])      06/26/2017 -  Chemotherapy    First line chemo FOLFOX every 2 weeks        08/29/2017 Imaging    IMPRESSION: 1. Interval decrease in size of fungating ulcerative mass arising from the posterior gastric fundus. 2. Left peritoneal implant, compatible with peritoneal carcinomatosis, has decreased in size from the previous exam. 3. Right lobe of liver lesion present on previous exam is less conspicuous on today's study. A new lesion is identified within the left lobe of liver, suspicious for metastasis. Additional focus of low attenuation within segment 5 adjacent to the gallbladder fossa may represent focal fatty deposition. 4. Similar appearance of mildly complex cyst containing mural calcification and possible internal septation arising from the inferior pole of right kidney. More definitive characterization of this lesion with renal protocol MRI may be helpful. 5.  Mild prostate gland enlargement. 6.  Aortic Atherosclerosis (ICD10-I70.0).     CURRENT THERAPY: first line chemo FOLFOX every 2 weeks, started on 06/26/2017  INTERVAL HISTORY: Ryan Collins returns for follow up as scheduled prior to cycle 8 FOLFOX. He completed cycle 7 on 09/18/17. He has mild intermittent fatigue for 3-4 days after chemo. He remains able to function and complete all activities. Appetite is low with chemo for 3 days but improves quickly. Denies n/v/c/d, blood in stool, or mucositis. He occasionally feels bloated after drinking a lot of liquids. Has mild intermittent cold sensitivity between cycles but denies other neuropathy.   REVIEW OF SYSTEMS:   Constitutional: Denies fevers, chills or abnormal weight loss (+) mild int fatigue x3-4 days after  chemo  Eyes: Denies blurriness of vision Ears, nose, mouth, throat, and face: Denies mucositis or sore throat Respiratory: Denies cough, dyspnea or wheezes Cardiovascular: Denies palpitation, chest discomfort or lower  extremity swelling Gastrointestinal:  Denies nausea, vomiting, constipation, diarrhea, hematochezia, abdominal pain, heartburn or change in bowel habits (+) bloating after consuming liquids  Skin: Denies abnormal skin rashes Lymphatics: Denies new lymphadenopathy or easy bruising Neurological:Denies numbness, tingling or new weaknesses (+) cold sensitivity int between chemo cycles  Behavioral/Psych: Mood is stable, no new changes  All other systems were reviewed with the patient and are negative.  MEDICAL HISTORY:  Past Medical History:  Diagnosis Date  . DVT (deep venous thrombosis) ([place]) 2011  . GI bleeding 2001  . History of DVT of lower extremity   . Melena 05/2017  . Stomach ulcer    Clinically suspected; No EGD confirmation as of 06/03/17    SURGICAL HISTORY: Past Surgical History:  Procedure Laterality Date  . ESOPHAGOGASTRODUODENOSCOPY  06/04/2017  . ESOPHAGOGASTRODUODENOSCOPY N/A 06/04/2017   Procedure: ESOPHAGOGASTRODUODENOSCOPY (EGD);  Surgeon: Carol Ada, MD;  Location: [place];  Service: Endoscopy;  Laterality: N/A;  . IR FLUORO GUIDE PORT INSERTION RIGHT  06/20/2017  . IR US GUIDE VASC ACCESS RIGHT  06/20/2017    I have reviewed the social history and family history with the patient and they are unchanged from previous note.  ALLERGIES:  has No Known Allergies.  MEDICATIONS:  Current Outpatient Medications  Medication Sig Dispense Refill  . acetaminophen (TYLENOL) 500 MG tablet Take 1,000 mg by mouth every 6 (six) hours as needed for mild pain.     Marland Kitchen amLODipine (NORVASC) 5 MG tablet Take 1 tablet (5 mg total) by mouth daily. 30 tablet 5  . Blood Pressure Monitoring (BLOOD PRESSURE MONITOR/M CUFF) MISC 1 each by Does not apply route daily. 1 each 0  . lidocaine-prilocaine (EMLA) cream Apply 1 application topically as needed (for port).    . ondansetron (ZOFRAN) 8 MG tablet Take 8 mg by mouth 2 (two) times daily as needed for refractory nausea / vomiting. Start  on day 3 after chemo    . pantoprazole (PROTONIX) 40 MG tablet Take 1 tablet (40 mg total) by mouth 2 (two) times daily. 60 tablet 0  . prochlorperazine (COMPAZINE) 10 MG tablet Take 10 mg by mouth every 6 (six) hours as needed for nausea or vomiting.    . thiamine 100 MG tablet Take 1 tablet (100 mg total) by mouth daily. 30 tablet 3  . vitamin B-12 (CYANOCOBALAMIN) 1000 MCG tablet Take 1 tablet (1,000 mcg total) by mouth daily. 30 tablet 0   No current facility-administered medications for this visit.     PHYSICAL EXAMINATION: ECOG PERFORMANCE STATUS: 1 - Symptomatic but completely ambulatory  Vitals:   10/02/17 0905  BP: 120/81  Pulse: 80  Resp: 18  Temp: 97.9 F (36.6 C)  SpO2: 100%   Filed Weights   10/02/17 0905  Weight: 180 lb (81.6 kg)    GENERAL:alert, no distress and comfortable SKIN: no rashes or significant lesions EYES: normal, Conjunctiva are pink and non-injected, sclera clear OROPHARYNX:no thrush or ulcers  LYMPH:  no palpable cervical or supraclavicular lymphadenopathy  LUNGS: clear to auscultation with normal breathing effort HEART: regular rate & rhythm and no murmurs (+) R leg > L leg at baseline; no calf tenderness  ABDOMEN:abdomen soft, non-tender and normal bowel sounds Musculoskeletal:no cyanosis of digits and no clubbing  NEURO: alert & oriented x 3 with fluent speech,  no focal motor/sensory deficits PAC without erythema   LABORATORY DATA:  I have reviewed the data as listed CBC Latest Ref Rng & Units 10/02/2017 09/18/2017 09/04/2017  WBC 4.0 - 10.3 K/uL 5.2 6.5 7.7  Hemoglobin 13.0 - 17.1 g/dL 12.0(L) 12.1(L) 12.2(L)  Hematocrit 38.4 - 49.9 % 36.1(L) 37.0(L) 37.3(L)  Platelets 140 - 400 K/uL 175 184 221     CMP Latest Ref Rng & Units 10/02/2017 09/18/2017 09/04/2017  Glucose 70 - 140 mg/dL 98 93 96  BUN 7 - 26 mg/dL _0 Creatinine 0.70 - 1.30 mg/dL 0.86 0.95 0.90  Sodium 136 - 145 mmol/L 137 139 138  Potassium 3.5 - 5.1 mmol/L 3.9 4.0 4.0    Chloride 98 - 109 mmol/L 108 107 108  CO2 22 - 29 mmol/L 22 26 20(L)  Calcium 8.4 - 10.4 mg/dL 8.8 9.2 9.6  Total Protein 6.4 - 8.3 g/dL 7.0 6.9 7.1  Total Bilirubin 0.2 - 1.2 mg/dL 0.4 0.4 0.4  Alkaline Phos 40 - 150 U/L 60 58 52  AST 5 - 34 U/L _1 ALT 0 - 55 U/L _2 CEA 06/13/17: 53.01 07/10/17: 106.44 08/07/17: 31.11 09/04/17: 11.02 10/02/17: PENDING   PATHOLOGY  Diagnosis 06/04/17 Stomach, biopsy, Fundus - ADENOCARCINOMA - SEE COMMENT Microscopic Comment The stomach biopsy is of an adenocarcinoma arising in a background of intestinal metaplasia. A Warthin-Starry stain is performed to determine the possibility of the presence of Helicobacter pylori. The Warthin-Starry stain is negative for organisms morphologically consistent with Helicobacter pylori.    RADIOGRAPHIC STUDIES: I have personally reviewed the radiological images as listed and agreed with the findings in the report. No results found.   ASSESSMENT & PLAN: Ryan Collins 57 y.o. male, without significant past medical history, but also does not  see doctors routinely, presented with worsening fatigue, intermittent epigastric pain and melena for 3-4 months, and 50 pounds weight loss over the past 6 months.  1.  Gastric cancer, [id number] with peritoneal metastasis, adenocarcinoma, HER2(-), MSI-stable, PD-L1positive (5) 2. Anemia of iron deficiency and tumor bleeding, B12 deficiency  3. History of DVT, ? Protein C deficiency  4. History of alcohol and marijuana use 5. Weight loss and malnutrition 6. Goals of care discussion  7. Right inguinal hernia  Mr. Maclellan appears stable. He completed cycle 7 FOLFOX on 09/18/17. He is tolerating treatment very well overall with mild fatigue and cold sensitivity. VS and weight are stable. Labs reviewed. Mild anemia is stable on CBC. CMP is normal. Iron studies and CEA are pending. He continues oral B12. Will continue FOLFOX q2 weeks at current dose. Proceed with  cycle 8 today. Plan to restage end of June if CEA continues to trend down.   PLAN -Labs reviewed, proceed with cycle 8 FOLFOX today, continue q2 weeks -F/u in 2 weeks with cycle 9 -F/u CEA and iron studies  All questions were answered. The patient knows to call the clinic with any problems, questions or concerns. No barriers to learning was detected.     Ryan Feeling, NP 10/02/17

## 2017-10-04 ENCOUNTER — Inpatient Hospital Stay: Payer: BLUE CROSS/BLUE SHIELD

## 2017-10-04 VITALS — BP 128/72 | HR 72 | Temp 98.2°F | Resp 17

## 2017-10-04 DIAGNOSIS — C161 Malignant neoplasm of fundus of stomach: Secondary | ICD-10-CM | POA: Diagnosis not present

## 2017-10-04 MED ORDER — SODIUM CHLORIDE 0.9% FLUSH
10.0000 mL | INTRAVENOUS | Status: DC | PRN
  Administered 2017-10-04: 10 mL
  Filled 2017-10-04: qty 10

## 2017-10-04 MED ORDER — HEPARIN SOD (PORK) LOCK FLUSH 100 UNIT/ML IV SOLN
500.0000 [IU] | Freq: Once | INTRAVENOUS | Status: AC | PRN
  Administered 2017-10-04: 500 [IU]
  Filled 2017-10-04: qty 5

## 2017-10-08 ENCOUNTER — Other Ambulatory Visit: Payer: Self-pay | Admitting: Nurse Practitioner

## 2017-10-08 DIAGNOSIS — C169 Malignant neoplasm of stomach, unspecified: Secondary | ICD-10-CM

## 2017-10-15 NOTE — Progress Notes (Signed)
[facility]  Telephone:([phone]) [phone] Fax:(336) [phone]  Clinic Follow Up Note   Patient Care Team: [name], FNP as PCP - General (Family Medicine) 10/16/2017  CHIEF COMPLAINTS:  Follow up gastric Cancer  Oncology History   Cancer Staging Gastric cancer [facility]) Staging form: Stomach, AJCC 8th Edition - Clinical stage from 06/04/2017: Stage IVB (cT4, cN0, cM1) - Signed by [name], MD on 06/10/2017       Gastric cancer ([place])   06/03/2017 - 06/06/2017 Hospital Admission    Admitted to the hospital on 06/03/17 with complaints of fatigue and melena. During his stay he underwent an endoscopy that revealed a malignant gastric tumor in the cardia and in the gastric fundus. Biopsy results revealed adenocarcinoma. Pt was discharged on 06/06/17.      06/04/2017 Initial Biopsy    Diagnosis 06/04/17 Stomach, biopsy, Fundus - ADENOCARCINOMA - SEE COMMENT Microscopic Comment The stomach biopsy is of an adenocarcinoma arising in a background of intestinal metaplasia. A Warthin-Starry stain is performed to determine the possibility of the presence of Helicobacter pylori. The Warthin-Starry stain is negative for organisms morphologically consistent with Helicobacter pylori.      06/04/2017 Procedure    Esophagogastroduodenoscopy 06/04/17 by Dr. Benson Norway Impression: Normal esophagus. Malignant gastric tumor in the cardia and in the  gastric fundus. Biopsied. Normal examined duodenum.      06/04/2017 Miscellaneous    HER2 (-) EBV (-) MSI-S PD-L1 CPS 5%      06/05/2017 Imaging    CT CAP W Contrast 06/05/17 IMPRESSION: 9.1 cm fungating mass in the gastric cardia/posterior gastric fundus, corresponding to the patient's newly diagnosed gastric Cancer. Associated extra gastric extension with peritoneal disease in the left upper abdomen, including a dominant 4.2 cm peritoneal implant. Small volume pelvic ascites. 1.8 cm hypoenhancing lesion along the inferior spleen,  indeterminate.      06/10/2017 Initial Diagnosis    Gastric cancer ([place])      06/26/2017 -  Chemotherapy    First line chemo FOLFOX every 2 weeks        08/29/2017 Imaging    IMPRESSION: 1. Interval decrease in size of fungating ulcerative mass arising from the posterior gastric fundus. 2. Left peritoneal implant, compatible with peritoneal carcinomatosis, has decreased in size from the previous exam. 3. Right lobe of liver lesion present on previous exam is less conspicuous on today's study. A new lesion is identified within the left lobe of liver, suspicious for metastasis. Additional focus of low attenuation within segment 5 adjacent to the gallbladder fossa may represent focal fatty deposition. 4. Similar appearance of mildly complex cyst containing mural calcification and possible internal septation arising from the inferior pole of right kidney. More definitive characterization of this lesion with renal protocol MRI may be helpful. 5.  Mild prostate gland enlargement. 6.  Aortic Atherosclerosis (ICD10-I70.0).        HISTORY OF PRESENTING ILLNESS:  Ryan Collins 57 y.o. male is a here because of newly diagnosed gastric cancer. The patient was referred by hospitalist after her recent hospital discharge.  The patient presents to the clinic today by himself.   Pt went to urgent care on 05/15/17 with complaints of worsening fatigue, central abdominal pain, black stool for 4 months, and 50 lbs weight loss in the past 6 months. discomfort over the past few months. He described the pain as sharp and dull that is worse after he eats. The pain would last 30-40 minutes after eating. Pt was then seen in the  ED and admitted to the hospital on 06/03/17 with complaints of fatigue and melena. During his stay he underwent an endoscopy that revealed a malignant gastric tumor in the cardia and in the gastric fundus. Biopsy of the mass revealed adenocarcinoma.  Patient received blood transfusion and 1 dose  of IV Feraheme, tolerated well.  His fatigue has much improved.  Pt was dischared on 06/06/17.    Patient is single, no children, lives with his brother in a trailer, he has multiple brothers and sisters and some of them live in the town.  Her mother also lives in [place].  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  He is concerned about his finances if he is not able to continue working.   CURRENT THERAPY: first line chemo FOLFOX every 2 weeks, started on 06/26/2017  INTERIM HISTORY:  Ryan Collins returns for follow-up and Cycle 9 FOLFOX. He presents to the clinic today by himself in the infusion room. He has been doing well overall.   He denies any issues with chemotherapy and he notes that he got sick with his last treatment, but not bad. He notes that he is able to do what he typical would do outside of his right inguinal hernia. He ran out of his protonix, however he notes that he hasn't had any issues with acid reflux.    On review of systems, he reports lack of appetite. he denies numbness, tingling, CP, SOB, BLE swelling, and any other symptoms. Pertinent positives are listed and detailed within the above HPI.    MEDICAL HISTORY:  Past Medical History:  Diagnosis Date  . DVT (deep venous thrombosis) ([place]) 2011  . GI bleeding 2001  . History of DVT of lower extremity   . Melena 05/2017  . Stomach ulcer    Clinically suspected; No EGD confirmation as of 06/03/17    SURGICAL HISTORY: Past Surgical History:  Procedure Laterality Date  . ESOPHAGOGASTRODUODENOSCOPY  06/04/2017  . ESOPHAGOGASTRODUODENOSCOPY N/A 06/04/2017   Procedure: ESOPHAGOGASTRODUODENOSCOPY (EGD);  Surgeon: Carol Ada, MD;  Location: [facility];  Service: Endoscopy;  Laterality: N/A;  . IR FLUORO GUIDE PORT INSERTION RIGHT  06/20/2017  . IR US GUIDE VASC ACCESS RIGHT  06/20/2017    SOCIAL HISTORY: Social History   Socioeconomic History  . Marital status: Legally Separated    Spouse  name: Not on file  . Number of children: Not on file  . Years of education: Not on file  . Highest education level: Not on file  Occupational History  . Not on file  Social Needs  . Financial resource strain: Not on file  . Food insecurity:    Worry: Not on file    Inability: Not on file  . Transportation needs:    Medical: Not on file    Non-medical: Not on file  Tobacco Use  . Smoking status: Never Smoker  . Smokeless tobacco: Never Used  Substance and Sexual Activity  . Alcohol use: Yes    Alcohol/week: 3.0 oz    Types: 5 Cans of beer per week    Comment: 05/2017 i QUIT DRINKING 4 MONTHS AGO "  . Drug use: Yes    Types: Marijuana    Comment: occ  . Sexual activity: Not on file  Lifestyle  . Physical activity:    Days per week: Not on file    Minutes per session: Not on file  . Stress: Not on file  Relationships  . Social connections:  Talks on phone: Not on file    Gets together: Not on file    Attends religious service: Not on file    Active member of club or organization: Not on file    Attends meetings of clubs or organizations: Not on file    Relationship status: Not on file  . Intimate partner violence:    Fear of current or ex partner: Not on file    Emotionally abused: Not on file    Physically abused: Not on file    Forced sexual activity: Not on file  Other Topics Concern  . Not on file  Social History Narrative  . Not on file    FAMILY HISTORY: Family History  Problem Relation Age of Onset  . Hypertension Mother   . Cancer Sister 43       breast    ALLERGIES:  has No Known Allergies.  MEDICATIONS:  Current Outpatient Medications  Medication Sig Dispense Refill  . acetaminophen (TYLENOL) 500 MG tablet Take 1,000 mg by mouth every 6 (six) hours as needed for mild pain.     Marland Kitchen amLODipine (NORVASC) 5 MG tablet Take 1 tablet (5 mg total) by mouth daily. 30 tablet 5  . Blood Pressure Monitoring (BLOOD PRESSURE MONITOR/M CUFF) MISC 1 each by Does  not apply route daily. 1 each 0  . lidocaine-prilocaine (EMLA) cream Apply 1 application topically as needed (for port).    . ondansetron (ZOFRAN) 8 MG tablet Take 8 mg by mouth 2 (two) times daily as needed for refractory nausea / vomiting. Start on day 3 after chemo    . pantoprazole (PROTONIX) 40 MG tablet TAKE 1 TABLET(40 MG) BY MOUTH TWICE DAILY 60 tablet 0  . prochlorperazine (COMPAZINE) 10 MG tablet Take 10 mg by mouth every 6 (six) hours as needed for nausea or vomiting.    . thiamine 100 MG tablet Take 1 tablet (100 mg total) by mouth daily. 30 tablet 3  . vitamin B-12 (CYANOCOBALAMIN) 1000 MCG tablet Take 1 tablet (1,000 mcg total) by mouth daily. 30 tablet 0   No current facility-administered medications for this visit.    Facility-Administered Medications Ordered in Other Visits  Medication Dose Route Frequency Provider Last Rate Last Dose  . dexamethasone (DECADRON) injection 10 mg  10 mg Intravenous Once [name], MD      . dextrose 5 % solution   Intravenous Once [name], MD      . fluorouracil (ADRUCIL) 4,700 mg in sodium chloride 0.9 % 56 mL chemo infusion  2,400 mg/m2 (Treatment Plan Recorded) Intravenous 1 day or 1 dose [name], MD      . fluorouracil (ADRUCIL) chemo injection 800 mg  400 mg/m2 (Treatment Plan Recorded) Intravenous Once [name], MD      . leucovorin 780 mg in dextrose 5 % 250 mL infusion  400 mg/m2 (Treatment Plan Recorded) Intravenous Once [name], MD      . oxaliplatin (ELOXATIN) 165 mg in dextrose 5 % 500 mL chemo infusion  85 mg/m2 (Treatment Plan Recorded) Intravenous Once [name], MD      . palonosetron (ALOXI) injection 0.25 mg  0.25 mg Intravenous Once [name], MD        REVIEW OF SYSTEMS:  Constitutional: Denies fevers, chills or abnormal night sweats, (+) over 50 pound weight loss in the past 6 months, improving, (+) fatigue  Eyes: Denies blurriness of vision, double vision or watery eyes Ears, nose, mouth, throat, and face: Denies  mucositis or sore throat Respiratory: Denies cough, dyspnea or wheezes Cardiovascular: Denies palpitation, chest discomfort or lower extremity swelling Gastrointestinal:  Denies nausea, heartburn (+) intermittent epigastric pain (+) right inguinal hernia  Skin: Denies abnormal skin rashes Lymphatics: Denies new lymphadenopathy or easy bruising Neurological:Denies numbness, tingling or new weaknesses Behavioral/Psych: Mood is stable, no new changes  All other systems were reviewed with the patient and are negative.  PHYSICAL EXAMINATION:  ECOG PERFORMANCE STATUS: 1 - Symptomatic but completely ambulatory Weight 183 pounds, heart rate 80, respiratory rate 18, temperature 98.4 GENERAL:alert, no distress and comfortable SKIN: skin color, texture, turgor are normal, no rashes or significant lesions EYES: normal, conjunctiva are pink and non-injected, sclera clear OROPHARYNX:no exudate, no erythema and lips, buccal mucosa, and tongue normal  NECK: supple, thyroid normal size, non-tender, without nodularity LYMPH:  no palpable lymphadenopathy in the cervical, axillary or inguinal LUNGS: clear to auscultation and percussion with normal breathing effort HEART: regular rate & rhythm and no murmurs and no lower extremity edema ABDOMEN:abdomen soft, non-tender and normal bowel sounds (+) right inguinal hernia Musculoskeletal:no cyanosis of digits and no clubbing  PSYCH: alert & oriented x 3 with fluent speech NEURO: no focal motor/sensory deficits  LABORATORY DATA:  I have reviewed the data as listed CBC Latest Ref Rng & Units 10/16/2017 10/02/2017 09/18/2017  WBC 4.0 - 10.3 K/uL 5.7 5.2 6.5  Hemoglobin 13.0 - 17.1 g/dL 11.8(L) 12.0(L) 12.1(L)  Hematocrit 38.4 - 49.9 % 36.1(L) 36.1(L) 37.0(L)  Platelets 140 - 400 K/uL 218 175 184    CMP Latest Ref Rng & Units 10/16/2017 10/02/2017 09/18/2017  Glucose 70 - 140 mg/dL 92 98 93  BUN 7 - 26 mg/dL _0 Creatinine 0.70 - 1.30 mg/dL 0.79 0.86 0.95    Sodium 136 - 145 mmol/L 137 137 139  Potassium 3.5 - 5.1 mmol/L 3.9 3.9 4.0  Chloride 98 - 109 mmol/L 106 108 107  CO2 22 - 29 mmol/L _1 Calcium 8.4 - 10.4 mg/dL 9.3 8.8 9.2  Total Protein 6.4 - 8.3 g/dL 7.1 7.0 6.9  Total Bilirubin 0.2 - 1.2 mg/dL 0.5 0.4 0.4  Alkaline Phos 40 - 150 U/L 59 60 58  AST 5 - 34 U/L _2 ALT 0 - 55 U/L _3 CEA 06/13/17: 53.01 07/10/17: 106.44 08/07/17: 31.11 09/04/17: 11.02  PATHOLOGY  Diagnosis 06/04/17 Stomach, biopsy, Fundus - ADENOCARCINOMA - SEE COMMENT Microscopic Comment The stomach biopsy is of an adenocarcinoma arising in a background of intestinal metaplasia. A Warthin-Starry stain is performed to determine the possibility of the presence of Helicobacter pylori. The Warthin-Starry stain is negative for organisms morphologically consistent with Helicobacter pylori.  RADIOGRAPHIC STUDIES: I have personally reviewed the radiological images as listed and agreed with the findings in the report. No results found. CT CAP W Contrast 06/05/17 IMPRESSION: 9.1 cm fungating mass in the gastric cardia/posterior gastric fundus, corresponding to the patient's newly diagnosed gastric Cancer. Associated extra gastric extension with peritoneal disease in the left upper abdomen, including a dominant 4.2 cm peritoneal implant. Small volume pelvic ascites. 1.8 cm hypoenhancing lesion along the inferior spleen, indeterminate.  PROCEDURE  Esophagogastroduodenoscopy 06/04/17 by Dr. Benson Norway Findings: The esophagus was normal. A large, fungating and ulcerated, non-circumferential mass with no bleeding and no stigmata of recent bleeding was found in the cardia and in the gastric fundus. Biopsies were taken with a cold forceps for histology. The examined duodenum was normal. A large friable fungating  gastric mass involving the cardia and the fundus was identified. The mass extended approximately 5 cm. It was difficult to measure the width of the lesion.  There was a large necrotic and deep ulcer in the mid portion of the mass. It is estimated that it encompasses 25% of the fundic circumfirence. Multiple cold biopsies were obtained. Impression: Normal esophagus. Malignant gastric tumor in the cardia and in the  gastric fundus. Biopsied. Normal examined duodenum.  ASSESSMENT & PLAN:  Ryan Collins 57 y.o. male, without significant past medical history, but also does not  see doctors routinely, presented with worsening fatigue, intermittent epigastric pain and melena for 3-4 months, and 50 pounds weight loss over the past 6 months.  1.  Gastric cancer, [id number] with peritoneal metastasis, adenocarcinoma, HER2(-), MSI-stable, PD-L1positive (5) -I previously reviewed his CT scan findings, endoscopy and biopsy results of the gastric mass with patient in details. He has a large mass in the cardia and in the gastric fundus, with evidence of extra gastric extension, and the peritoneum metastasis on CT scan.  No other distant metastasis. -Due to the peritoneal metastasis, unfortunately his cancer is not curable at this stage. We previously discussed the role of HIPEC if he has excellent response to chemotherapy, and no evidence of other metastasis, I may consider refer him to Dr. Lennie Odor in the future. Although due to the aggressive nature of gastric cancer, HIPEC is usually not offered.  -He started first-line chemotherapy with FOLFOX on 06/26/17, he has been tolerating well so far. -Goal of therapy is palliative. -His tumor is negative for HER-2, MSI-stable, PD-L1positive (5).  He would be a candidate for immunotherapy as third line -His tumor marker has been trending down, CEA 11.02 on 09/04/17.  -His CT CAP W Contrast from 08/29/17 revealed a good partial response except one indeterminate lesion in left lobe of liver. I have personally reviewed his recent restaging CT, and discussed with radiologist Dr. [name]. I agree with his interpretation. I recommended  continuing chemo at that time. Pt agreed. -labs reviewed, adequate for Cycle 7 today -he has a right inguinal hernia onset in Jan 2019 and expressed interest in having it repaired. I will refer him -I previously discussed that immunotherapy will be an option for him in the future.  -If he has excellent response to chemo, may send him to Dr. [name] to consider debulking and HIPEC  -Labs today, 10/16/2017 show: CMP WNL. CBC with RBC at 4.06 and Hgb at 11.8.  -F/u as scheduled on July 17th with scan prior.    2.  Anemia of iron deficiency and tumor bleeding, B12 deficiency -He has clinical GI bleeding with melena, iron study also showed iron deficiency. -He has received blood transfusion, and 2 dose of IV Feraheme, repeated iron level was adequate.   -We continue to monitor his CBC and iron studies closely -He will continue Oral-B complex   3. History of DVT, ?  Protein C deficiency -he was on coumadin for his DVT before, and previous labs showed a protein C deficiency, which increases his risk of thrombosis. -We previously discussed the high risk of thrombosis secondary to his underlying malignancy. -Due to his chronic GI bleeding from his tumor, I would not do prophylactic anticoagulation.  4. History of alcohol and marijuana abuse -He has quit drinking alcohol since his diagnosis of cancer -He does smoke marijuana occasionally  5. Weight loss and malnutrition  -He has lost significant weight since the diagnosis -Previously encouraged him to increase his  nutrition supplement.  He has been seen by our dietitian, will follow-up next week. -He is gaining weight now   6. Goal of care discussion  -We previously discussed the incurable nature of his cancer, and the overall poor prognosis, especially if he does not have good response to chemotherapy or progress on chemo -The patient understands the goal of care is palliative. -he is full code now   7.  Right inguinal hernia -I will refer  him to general surgeon for consideration of right inguinal hernia repair.    No orders of the defined types were placed in this encounter.   PLAN: -Labs reviewed, proceed with cycle 8 FOLFOX today, continue q2 weeks -F/u as scheduled on July 17th with scan prior.  -Surgical referral for his right inguinal hernia  All questions were answered. The patient knows to call the clinic with any problems, questions or concerns. I spent 15 minutes counseling the patient face to face. The total time spent in the appointment was 20 minutes and more than 50% was on counseling.  I, Soijett Blue am acting as scribe for Dr. [name].  I have reviewed the above documentation for accuracy and completeness, and I agree with the above.    [name], MD 10/16/2017 1:52 PM

## 2017-10-16 ENCOUNTER — Inpatient Hospital Stay (HOSPITAL_BASED_OUTPATIENT_CLINIC_OR_DEPARTMENT_OTHER): Payer: BLUE CROSS/BLUE SHIELD | Admitting: Hematology

## 2017-10-16 ENCOUNTER — Inpatient Hospital Stay: Payer: BLUE CROSS/BLUE SHIELD

## 2017-10-16 ENCOUNTER — Inpatient Hospital Stay: Payer: BLUE CROSS/BLUE SHIELD | Attending: Hematology

## 2017-10-16 VITALS — BP 150/88 | HR 69 | Temp 98.5°F | Resp 18

## 2017-10-16 DIAGNOSIS — C786 Secondary malignant neoplasm of retroperitoneum and peritoneum: Secondary | ICD-10-CM

## 2017-10-16 DIAGNOSIS — C161 Malignant neoplasm of fundus of stomach: Secondary | ICD-10-CM | POA: Insufficient documentation

## 2017-10-16 DIAGNOSIS — E538 Deficiency of other specified B group vitamins: Secondary | ICD-10-CM | POA: Diagnosis not present

## 2017-10-16 DIAGNOSIS — Z79899 Other long term (current) drug therapy: Secondary | ICD-10-CM

## 2017-10-16 DIAGNOSIS — K922 Gastrointestinal hemorrhage, unspecified: Secondary | ICD-10-CM | POA: Insufficient documentation

## 2017-10-16 DIAGNOSIS — R634 Abnormal weight loss: Secondary | ICD-10-CM

## 2017-10-16 DIAGNOSIS — D500 Iron deficiency anemia secondary to blood loss (chronic): Secondary | ICD-10-CM | POA: Diagnosis not present

## 2017-10-16 DIAGNOSIS — K4090 Unilateral inguinal hernia, without obstruction or gangrene, not specified as recurrent: Secondary | ICD-10-CM

## 2017-10-16 DIAGNOSIS — F1011 Alcohol abuse, in remission: Secondary | ICD-10-CM

## 2017-10-16 DIAGNOSIS — R630 Anorexia: Secondary | ICD-10-CM | POA: Insufficient documentation

## 2017-10-16 DIAGNOSIS — Z86718 Personal history of other venous thrombosis and embolism: Secondary | ICD-10-CM | POA: Diagnosis not present

## 2017-10-16 DIAGNOSIS — F1210 Cannabis abuse, uncomplicated: Secondary | ICD-10-CM | POA: Insufficient documentation

## 2017-10-16 DIAGNOSIS — Z5111 Encounter for antineoplastic chemotherapy: Secondary | ICD-10-CM | POA: Insufficient documentation

## 2017-10-16 LAB — CBC WITH DIFFERENTIAL (CANCER CENTER ONLY)
BASOS ABS: 0 10*3/uL (ref 0.0–0.1)
Basophils Relative: 1 %
EOS PCT: 4 %
Eosinophils Absolute: 0.2 10*3/uL (ref 0.0–0.5)
HCT: 36.1 % — ABNORMAL LOW (ref 38.4–49.9)
HEMOGLOBIN: 11.8 g/dL — AB (ref 13.0–17.1)
LYMPHS ABS: 2.4 10*3/uL (ref 0.9–3.3)
LYMPHS PCT: 42 %
MCH: 29.1 pg (ref 27.2–33.4)
MCHC: 32.8 g/dL (ref 32.0–36.0)
MCV: 88.8 fL (ref 79.3–98.0)
Monocytes Absolute: 1.1 10*3/uL — ABNORMAL HIGH (ref 0.1–0.9)
Monocytes Relative: 19 %
NEUTROS ABS: 2 10*3/uL (ref 1.5–6.5)
Neutrophils Relative %: 34 %
Platelet Count: 218 10*3/uL (ref 140–400)
RBC: 4.06 MIL/uL — AB (ref 4.20–5.82)
RDW: 18.3 % — AB (ref 11.0–14.6)
WBC: 5.7 10*3/uL (ref 4.0–10.3)

## 2017-10-16 LAB — CMP (CANCER CENTER ONLY)
ALT: 18 U/L (ref 0–55)
AST: 31 U/L (ref 5–34)
Albumin: 4 g/dL (ref 3.5–5.0)
Alkaline Phosphatase: 59 U/L (ref 40–150)
Anion gap: 6 (ref 3–11)
BILIRUBIN TOTAL: 0.5 mg/dL (ref 0.2–1.2)
BUN: 10 mg/dL (ref 7–26)
CHLORIDE: 106 mmol/L (ref 98–109)
CO2: 25 mmol/L (ref 22–29)
CREATININE: 0.79 mg/dL (ref 0.70–1.30)
Calcium: 9.3 mg/dL (ref 8.4–10.4)
GFR, Est AFR Am: >60 mL/min (ref >60)
Glucose, Bld: 92 mg/dL (ref 70–140)
Potassium: 3.9 mmol/L (ref 3.5–5.1)
Sodium: 137 mmol/L (ref 136–145)
Total Protein: 7.1 g/dL (ref 6.4–8.3)

## 2017-10-16 MED ORDER — SODIUM CHLORIDE 0.9% FLUSH
10.0000 mL | INTRAVENOUS | Status: DC | PRN
  Administered 2017-10-16: 10 mL via INTRAVENOUS
  Filled 2017-10-16: qty 10

## 2017-10-16 MED ORDER — FLUOROURACIL CHEMO INJECTION 2.5 GM/50ML
400.0000 mg/m2 | Freq: Once | INTRAVENOUS | Status: AC
  Administered 2017-10-16: 800 mg via INTRAVENOUS
  Filled 2017-10-16: qty 16

## 2017-10-16 MED ORDER — PALONOSETRON HCL INJECTION 0.25 MG/5ML
0.2500 mg | Freq: Once | INTRAVENOUS | Status: AC
  Administered 2017-10-16: 0.25 mg via INTRAVENOUS

## 2017-10-16 MED ORDER — OXALIPLATIN CHEMO INJECTION 100 MG/20ML
85.0000 mg/m2 | Freq: Once | INTRAVENOUS | Status: AC
  Administered 2017-10-16: 165 mg via INTRAVENOUS
  Filled 2017-10-16: qty 33

## 2017-10-16 MED ORDER — LEUCOVORIN CALCIUM INJECTION 350 MG
400.0000 mg/m2 | Freq: Once | INTRAVENOUS | Status: AC
  Administered 2017-10-16: 780 mg via INTRAVENOUS
  Filled 2017-10-16: qty 39

## 2017-10-16 MED ORDER — DEXAMETHASONE SODIUM PHOSPHATE 10 MG/ML IJ SOLN
10.0000 mg | Freq: Once | INTRAMUSCULAR | Status: AC
  Administered 2017-10-16: 10 mg via INTRAVENOUS

## 2017-10-16 MED ORDER — PALONOSETRON HCL INJECTION 0.25 MG/5ML
INTRAVENOUS | Status: AC
  Filled 2017-10-16: qty 5

## 2017-10-16 MED ORDER — DEXTROSE 5 % IV SOLN
Freq: Once | INTRAVENOUS | Status: AC
  Administered 2017-10-16: 14:00:00 via INTRAVENOUS

## 2017-10-16 MED ORDER — SODIUM CHLORIDE 0.9 % IV SOLN
2400.0000 mg/m2 | INTRAVENOUS | Status: DC
  Administered 2017-10-16: 4700 mg via INTRAVENOUS
  Filled 2017-10-16: qty 94

## 2017-10-16 MED ORDER — DEXAMETHASONE SODIUM PHOSPHATE 10 MG/ML IJ SOLN
INTRAMUSCULAR | Status: AC
  Filled 2017-10-16: qty 1

## 2017-10-16 NOTE — Patient Instructions (Signed)
[facility] Discharge Instructions for Patients Receiving Chemotherapy  Today you received the following chemotherapy agents Oxaliplatin, Leucovorin, Fluorouracil.   To help prevent nausea and vomiting after your treatment, we encourage you to take your nausea medication as directed.  If you develop nausea and vomiting that is not controlled by your nausea medication, call the clinic.   BELOW ARE SYMPTOMS THAT SHOULD BE REPORTED IMMEDIATELY:  *FEVER GREATER THAN 100.5 F  *CHILLS WITH OR WITHOUT FEVER  NAUSEA AND VOMITING THAT IS NOT CONTROLLED WITH YOUR NAUSEA MEDICATION  *UNUSUAL SHORTNESS OF BREATH  *UNUSUAL BRUISING OR BLEEDING  TENDERNESS IN MOUTH AND THROAT WITH OR WITHOUT PRESENCE OF ULCERS  *URINARY PROBLEMS  *BOWEL PROBLEMS  UNUSUAL RASH Items with * indicate a potential emergency and should be followed up as soon as possible.  Feel free to call the clinic should you have any questions or concerns. The clinic phone number is [phone].  Please show the CHEMO ALERT CARD at check-in to the Emergency Department and triage nurse.   

## 2017-10-17 ENCOUNTER — Encounter: Payer: Self-pay | Admitting: Hematology

## 2017-10-17 ENCOUNTER — Telehealth: Payer: Self-pay | Admitting: Hematology

## 2017-10-17 NOTE — Telephone Encounter (Signed)
No los 6/5 °

## 2017-10-18 ENCOUNTER — Inpatient Hospital Stay: Payer: BLUE CROSS/BLUE SHIELD

## 2017-10-18 VITALS — BP 119/75 | HR 71 | Temp 98.7°F | Resp 18

## 2017-10-18 DIAGNOSIS — C161 Malignant neoplasm of fundus of stomach: Secondary | ICD-10-CM | POA: Diagnosis not present

## 2017-10-18 MED ORDER — SODIUM CHLORIDE 0.9% FLUSH
10.0000 mL | INTRAVENOUS | Status: DC | PRN
  Administered 2017-10-18: 10 mL
  Filled 2017-10-18: qty 10

## 2017-10-18 MED ORDER — HEPARIN SOD (PORK) LOCK FLUSH 100 UNIT/ML IV SOLN
500.0000 [IU] | Freq: Once | INTRAVENOUS | Status: AC | PRN
  Administered 2017-10-18: 500 [IU]
  Filled 2017-10-18: qty 5

## 2017-10-30 ENCOUNTER — Inpatient Hospital Stay: Payer: BLUE CROSS/BLUE SHIELD

## 2017-10-30 VITALS — BP 117/72 | HR 73 | Temp 98.0°F | Resp 18 | Wt 173.2 lb

## 2017-10-30 DIAGNOSIS — C161 Malignant neoplasm of fundus of stomach: Secondary | ICD-10-CM

## 2017-10-30 DIAGNOSIS — D500 Iron deficiency anemia secondary to blood loss (chronic): Secondary | ICD-10-CM

## 2017-10-30 LAB — CBC WITH DIFFERENTIAL (CANCER CENTER ONLY)
Basophils Absolute: 0.1 10*3/uL (ref 0.0–0.1)
Basophils Relative: 1 %
Eosinophils Absolute: 0.1 10*3/uL (ref 0.0–0.5)
Eosinophils Relative: 1 %
HEMATOCRIT: 38.2 % — AB (ref 38.4–49.9)
HEMOGLOBIN: 12.7 g/dL — AB (ref 13.0–17.1)
LYMPHS ABS: 1.9 10*3/uL (ref 0.9–3.3)
Lymphocytes Relative: 31 %
MCH: 29.7 pg (ref 27.2–33.4)
MCHC: 33.3 g/dL (ref 32.0–36.0)
MCV: 89.3 fL (ref 79.3–98.0)
MONO ABS: 1.1 10*3/uL — AB (ref 0.1–0.9)
MONOS PCT: 19 %
NEUTROS ABS: 2.9 10*3/uL (ref 1.5–6.5)
NEUTROS PCT: 48 %
Platelet Count: 199 10*3/uL (ref 140–400)
RBC: 4.28 MIL/uL (ref 4.20–5.82)
RDW: 17.4 % — AB (ref 11.0–14.6)
WBC Count: 6 10*3/uL (ref 4.0–10.3)

## 2017-10-30 LAB — CMP (CANCER CENTER ONLY)
ALBUMIN: 4 g/dL (ref 3.5–5.0)
ALK PHOS: 60 U/L (ref 40–150)
ALT: 16 U/L (ref 0–55)
ANION GAP: 8 (ref 3–11)
AST: 29 U/L (ref 5–34)
BUN: 14 mg/dL (ref 7–26)
CHLORIDE: 104 mmol/L (ref 98–109)
CO2: 23 mmol/L (ref 22–29)
CREATININE: 0.89 mg/dL (ref 0.70–1.30)
Calcium: 9.6 mg/dL (ref 8.4–10.4)
GFR, Estimated: >60 mL/min (ref >60)
GLUCOSE: 94 mg/dL (ref 70–140)
Potassium: 4.3 mmol/L (ref 3.5–5.1)
SODIUM: 135 mmol/L — AB (ref 136–145)
Total Bilirubin: 0.6 mg/dL (ref 0.2–1.2)
Total Protein: 7.4 g/dL (ref 6.4–8.3)

## 2017-10-30 LAB — IRON AND TIBC
Iron: 85 ug/dL (ref 42–163)
Saturation Ratios: 32 % — ABNORMAL LOW (ref 42–163)
TIBC: 261 ug/dL (ref 202–409)
UIBC: 177 ug/dL

## 2017-10-30 LAB — FERRITIN: Ferritin: 348 ng/mL — ABNORMAL HIGH (ref 22–316)

## 2017-10-30 LAB — CEA (IN HOUSE-CHCC): CEA (CHCC-IN HOUSE): 4.31 ng/mL (ref 0.00–5.00)

## 2017-10-30 MED ORDER — DEXAMETHASONE SODIUM PHOSPHATE 10 MG/ML IJ SOLN
INTRAMUSCULAR | Status: AC
Start: 2017-10-30 — End: ?
  Filled 2017-10-30: qty 1

## 2017-10-30 MED ORDER — PALONOSETRON HCL INJECTION 0.25 MG/5ML
INTRAVENOUS | Status: AC
  Filled 2017-10-30: qty 5

## 2017-10-30 MED ORDER — SODIUM CHLORIDE 0.9 % IV SOLN
2400.0000 mg/m2 | INTRAVENOUS | Status: DC
  Administered 2017-10-30: 4700 mg via INTRAVENOUS
  Filled 2017-10-30: qty 94

## 2017-10-30 MED ORDER — DEXTROSE 5 % IV SOLN
Freq: Once | INTRAVENOUS | Status: AC
  Administered 2017-10-30: 10:00:00 via INTRAVENOUS

## 2017-10-30 MED ORDER — OXALIPLATIN CHEMO INJECTION 100 MG/20ML
85.0000 mg/m2 | Freq: Once | INTRAVENOUS | Status: AC
  Administered 2017-10-30: 165 mg via INTRAVENOUS
  Filled 2017-10-30: qty 33

## 2017-10-30 MED ORDER — FLUOROURACIL CHEMO INJECTION 2.5 GM/50ML
400.0000 mg/m2 | Freq: Once | INTRAVENOUS | Status: AC
  Administered 2017-10-30: 800 mg via INTRAVENOUS
  Filled 2017-10-30: qty 16

## 2017-10-30 MED ORDER — PALONOSETRON HCL INJECTION 0.25 MG/5ML
0.2500 mg | Freq: Once | INTRAVENOUS | Status: AC
  Administered 2017-10-30: 0.25 mg via INTRAVENOUS

## 2017-10-30 MED ORDER — LEUCOVORIN CALCIUM INJECTION 350 MG
400.0000 mg/m2 | Freq: Once | INTRAVENOUS | Status: AC
  Administered 2017-10-30: 780 mg via INTRAVENOUS
  Filled 2017-10-30: qty 39

## 2017-10-30 MED ORDER — DEXAMETHASONE SODIUM PHOSPHATE 10 MG/ML IJ SOLN
10.0000 mg | Freq: Once | INTRAMUSCULAR | Status: AC
  Administered 2017-10-30: 10 mg via INTRAVENOUS

## 2017-10-30 NOTE — Patient Instructions (Signed)
[facility] Discharge Instructions for Patients Receiving Chemotherapy  Today you received the following chemotherapy agents: Oxaliplatin, Leucovorin and Fluorouracil.   To help prevent nausea and vomiting after your treatment, we encourage you to take your nausea medication as directed.  If you develop nausea and vomiting that is not controlled by your nausea medication, call the clinic.   BELOW ARE SYMPTOMS THAT SHOULD BE REPORTED IMMEDIATELY:  *FEVER GREATER THAN 100.5 F  *CHILLS WITH OR WITHOUT FEVER  NAUSEA AND VOMITING THAT IS NOT CONTROLLED WITH YOUR NAUSEA MEDICATION  *UNUSUAL SHORTNESS OF BREATH  *UNUSUAL BRUISING OR BLEEDING  TENDERNESS IN MOUTH AND THROAT WITH OR WITHOUT PRESENCE OF ULCERS  *URINARY PROBLEMS  *BOWEL PROBLEMS  UNUSUAL RASH Items with * indicate a potential emergency and should be followed up as soon as possible.  Feel free to call the clinic should you have any questions or concerns. The clinic phone number is [phone].  Please show the [facility] at check-in to the Emergency Department and triage nurse.

## 2017-10-30 NOTE — Patient Instructions (Signed)
Implanted Port Home Guide An implanted port is a type of central line that is placed under the skin. Central lines are used to provide IV access when treatment or nutrition needs to be given through a person's veins. Implanted ports are used for long-term IV access. An implanted port may be placed because:  You need IV medicine that would be irritating to the small veins in your hands or arms.  You need long-term IV medicines, such as antibiotics.  You need IV nutrition for a long period.  You need frequent blood draws for lab tests.  You need dialysis.  Implanted ports are usually placed in the chest area, but they can also be placed in the upper arm, the abdomen, or the leg. An implanted port has two main parts:  Reservoir. The reservoir is round and will appear as a small, raised area under your skin. The reservoir is the part where a needle is inserted to give medicines or draw blood.  Catheter. The catheter is a thin, flexible tube that extends from the reservoir. The catheter is placed into a large vein. Medicine that is inserted into the reservoir goes into the catheter and then into the vein.  How will I care for my incision site? Do not get the incision site wet. Bathe or shower as directed by your health care provider. How is my port accessed? Special steps must be taken to access the port:  Before the port is accessed, a numbing cream can be placed on the skin. This helps numb the skin over the port site.  Your health care provider uses a sterile technique to access the port. ? Your health care provider must put on a mask and sterile gloves. ? The skin over your port is cleaned carefully with an antiseptic and allowed to dry. ? The port is gently pinched between sterile gloves, and a needle is inserted into the port.  Only "non-coring" port needles should be used to access the port. Once the port is accessed, a blood return should be checked. This helps ensure that the port  is in the vein and is not clogged.  If your port needs to remain accessed for a constant infusion, a clear (transparent) bandage will be placed over the needle site. The bandage and needle will need to be changed every week, or as directed by your health care provider.  Keep the bandage covering the needle clean and dry. Do not get it wet. Follow your health care provider's instructions on how to take a shower or bath while the port is accessed.  If your port does not need to stay accessed, no bandage is needed over the port.  What is flushing? Flushing helps keep the port from getting clogged. Follow your health care provider's instructions on how and when to flush the port. Ports are usually flushed with saline solution or a medicine called heparin. The need for flushing will depend on how the port is used.  If the port is used for intermittent medicines or blood draws, the port will need to be flushed: ? After medicines have been given. ? After blood has been drawn. ? As part of routine maintenance.  If a constant infusion is running, the port may not need to be flushed.  How long will my port stay implanted? The port can stay in for as long as your health care provider thinks it is needed. When it is time for the port to come out, surgery will be   done to remove it. The procedure is similar to the one performed when the port was put in. When should I seek immediate medical care? When you have an implanted port, you should seek immediate medical care if:  You notice a bad smell coming from the incision site.  You have swelling, redness, or drainage at the incision site.  You have more swelling or pain at the port site or the surrounding area.  You have a fever that is not controlled with medicine.  This information is not intended to replace advice given to you by your health care provider. Make sure you discuss any questions you have with your health care provider. Document  Released: 04/30/2005 Document Revised: 10/06/2015 Document Reviewed: 01/05/2013 Elsevier Interactive Patient Education  2017 [employer]  

## 2017-11-01 ENCOUNTER — Inpatient Hospital Stay: Payer: BLUE CROSS/BLUE SHIELD

## 2017-11-01 VITALS — BP 106/71 | HR 86 | Temp 98.3°F | Resp 18

## 2017-11-01 DIAGNOSIS — C161 Malignant neoplasm of fundus of stomach: Secondary | ICD-10-CM

## 2017-11-01 MED ORDER — SODIUM CHLORIDE 0.9% FLUSH
10.0000 mL | INTRAVENOUS | Status: DC | PRN
  Administered 2017-11-01: 10 mL
  Filled 2017-11-01: qty 10

## 2017-11-01 MED ORDER — HEPARIN SOD (PORK) LOCK FLUSH 100 UNIT/ML IV SOLN
500.0000 [IU] | Freq: Once | INTRAVENOUS | Status: AC | PRN
  Administered 2017-11-01: 500 [IU]
  Filled 2017-11-01: qty 5

## 2017-11-06 ENCOUNTER — Other Ambulatory Visit: Payer: BLUE CROSS/BLUE SHIELD

## 2017-11-12 NOTE — Progress Notes (Signed)
Ryan Collins  [phone] [phone]  Clinic Follow up Note   Patient Care Team: [name], FNP as PCP - General (Family Medicine) 11/13/2017  SUMMARY OF ONCOLOGIC HISTORY: Oncology History   Cancer Staging Gastric cancer [name]) Staging form: Stomach, AJCC 8th Edition - Clinical stage from 06/04/2017: Stage IVB (cT4, cN0, cM1) - Signed by [name], MD on 06/10/2017       Gastric cancer ([place])   06/03/2017 - 06/06/2017 Hospital Admission    Admitted to the hospital on 06/03/17 with complaints of fatigue and melena. During his stay he underwent an endoscopy that revealed a malignant gastric tumor in the cardia and in the gastric fundus. Biopsy results revealed adenocarcinoma. Pt was discharged on 06/06/17.      06/04/2017 Initial Biopsy    Diagnosis 06/04/17 Stomach, biopsy, Fundus - ADENOCARCINOMA - SEE COMMENT Microscopic Comment The stomach biopsy is of an adenocarcinoma arising in a background of intestinal metaplasia. A Warthin-Starry stain is performed to determine the possibility of the presence of Helicobacter pylori. The Warthin-Starry stain is negative for organisms morphologically consistent with Helicobacter pylori.      06/04/2017 Procedure    Esophagogastroduodenoscopy 06/04/17 by Dr. Benson Norway Impression: Normal esophagus. Malignant gastric tumor in the cardia and in the  gastric fundus. Biopsied. Normal examined duodenum.      06/04/2017 Miscellaneous    HER2 (-) EBV (-) MSI-S PD-L1 CPS 5%      06/05/2017 Imaging    CT CAP W Contrast 06/05/17 IMPRESSION: 9.1 cm fungating mass in the gastric cardia/posterior gastric fundus, corresponding to the patient's newly diagnosed gastric Cancer. Associated extra gastric extension with peritoneal disease in the left upper abdomen, including a dominant 4.2 cm peritoneal implant. Small volume pelvic ascites. 1.8 cm hypoenhancing lesion along the inferior spleen, indeterminate.      06/10/2017 Initial Diagnosis    Gastric cancer ([place])      06/26/2017 -  Chemotherapy    First line chemo FOLFOX every 2 weeks        08/29/2017 Imaging    IMPRESSION: 1. Interval decrease in size of fungating ulcerative mass arising from the posterior gastric fundus. 2. Left peritoneal implant, compatible with peritoneal carcinomatosis, has decreased in size from the previous exam. 3. Right lobe of liver lesion present on previous exam is less conspicuous on today's study. A new lesion is identified within the left lobe of liver, suspicious for metastasis. Additional focus of low attenuation within segment 5 adjacent to the gallbladder fossa may represent focal fatty deposition. 4. Similar appearance of mildly complex cyst containing mural calcification and possible internal septation arising from the inferior pole of right kidney. More definitive characterization of this lesion with renal protocol MRI may be helpful. 5.  Mild prostate gland enlargement. 6.  Aortic Atherosclerosis (ICD10-I70.0).     CURRENT THERAPY: first line chemo FOLFOX every 2 weeks, started on 06/26/2017   INTERVAL HISTORY: Ryan Collins returns for follow up and next cycle FOLFOX as scheduled. He completed cycle 10 on 10/30/17. He has cold sensitivity and intermittent tingling in his fingertips lasting 5 days after oxaliplatin. No other neuropathy. Appetite and energy level are good. Denies n/v/c/d or abdominal pain. No blood in stool. Denies fever, chills, cough, dyspnea, or chest pain.   REVIEW OF SYSTEMS:   Constitutional: Denies fevers, chills or abnormal weight loss Eyes: Denies blurriness of vision Ears, nose, mouth, throat, and face: Denies mucositis or sore throat Respiratory: Denies cough, dyspnea or wheezes Cardiovascular: Denies  palpitation, chest discomfort or lower extremity swelling Gastrointestinal:  Denies nausea, vomiting, constipation, diarrhea, abd pain, heartburn or change in bowel habits Skin:  Denies abnormal skin rashes Lymphatics: Denies new lymphadenopathy or easy bruising Neurological:Denies numbness, tingling or new weaknesses (+) cold sensitivity x5 days  Behavioral/Psych: Mood is stable, no new changes  All other systems were reviewed with the patient and are negative.  MEDICAL HISTORY:  Past Medical History:  Diagnosis Date  . DVT (deep venous thrombosis) ([place]) 2011  . GI bleeding 2001  . History of DVT of lower extremity   . Melena 05/2017  . Stomach ulcer    Clinically suspected; No EGD confirmation as of 06/03/17    SURGICAL HISTORY: Past Surgical History:  Procedure Laterality Date  . ESOPHAGOGASTRODUODENOSCOPY  06/04/2017  . ESOPHAGOGASTRODUODENOSCOPY N/A 06/04/2017   Procedure: ESOPHAGOGASTRODUODENOSCOPY (EGD);  Surgeon: Carol Ada, MD;  Location: [facility];  Service: Endoscopy;  Laterality: N/A;  . IR FLUORO GUIDE PORT INSERTION RIGHT  06/20/2017  . IR US GUIDE VASC ACCESS RIGHT  06/20/2017    I have reviewed the social history and family history with the patient and they are unchanged from previous note.  ALLERGIES:  has No Known Allergies.  MEDICATIONS:  Current Outpatient Medications  Medication Sig Dispense Refill  . acetaminophen (TYLENOL) 500 MG tablet Take 1,000 mg by mouth every 6 (six) hours as needed for mild pain.     Marland Kitchen amLODipine (NORVASC) 5 MG tablet Take 1 tablet (5 mg total) by mouth daily. 30 tablet 5  . Blood Pressure Monitoring (BLOOD PRESSURE MONITOR/M CUFF) MISC 1 each by Does not apply route daily. 1 each 0  . lidocaine-prilocaine (EMLA) cream Apply 1 application topically as needed (for port).    . ondansetron (ZOFRAN) 8 MG tablet Take 8 mg by mouth 2 (two) times daily as needed for refractory nausea / vomiting. Start on day 3 after chemo    . pantoprazole (PROTONIX) 40 MG tablet TAKE 1 TABLET(40 MG) BY MOUTH TWICE DAILY 60 tablet 0  . prochlorperazine (COMPAZINE) 10 MG tablet Take 1 tablet (10 mg total) by mouth every 6 (six)  hours as needed for nausea or vomiting. 30 tablet 3  . thiamine 100 MG tablet Take 1 tablet (100 mg total) by mouth daily. 30 tablet 3  . vitamin B-12 (CYANOCOBALAMIN) 1000 MCG tablet Take 1 tablet (1,000 mcg total) by mouth daily. 30 tablet 0   No current facility-administered medications for this visit.    Facility-Administered Medications Ordered in Other Visits  Medication Dose Route Frequency Provider Last Rate Last Dose  . fluorouracil (ADRUCIL) 4,700 mg in sodium chloride 0.9 % 56 mL chemo infusion  2,400 mg/m2 (Treatment Plan Recorded) Intravenous 1 day or 1 dose [name], MD   4,700 mg at 11/13/17 1441    PHYSICAL EXAMINATION: ECOG PERFORMANCE STATUS: 0 - Asymptomatic  Vitals:   11/13/17 0931  BP: 115/79  Pulse: 77  Resp: 18  Temp: 98.7 F (37.1 C)  SpO2: 100%   Filed Weights   11/13/17 0931  Weight: 176 lb 14.4 oz (80.2 kg)    GENERAL:alert, no distress and comfortable SKIN: no rashes or significant lesions EYES: normal, Conjunctiva are pink and non-injected, sclera clear OROPHARYNX:no thrush or ulcers   LYMPH:  no palpable cervical or supraclavicular lymphadenopathy LUNGS: clear to auscultation with normal breathing effort HEART: regular rate & rhythm and no murmurs and no lower extremity edema ABDOMEN:abdomen soft, non-tender and normal bowel sounds Musculoskeletal:no cyanosis of digits and  no clubbing  NEURO: alert & oriented x 3 with fluent speech, no focal motor/sensory deficits PAC without erythema   LABORATORY DATA:  I have reviewed the data as listed CBC Latest Ref Rng & Units 11/13/2017 10/30/2017 10/16/2017  WBC 4.0 - 10.3 K/uL 5.6 6.0 5.7  Hemoglobin 13.0 - 17.1 g/dL 11.9(L) 12.7(L) 11.8(L)  Hematocrit 38.4 - 49.9 % 36.7(L) 38.2(L) 36.1(L)  Platelets 140 - 400 K/uL 181 199 218     CMP Latest Ref Rng & Units 11/13/2017 10/30/2017 10/16/2017  Glucose 70 - 99 mg/dL 95 94 92  BUN 6 - 20 mg/dL 12 14 10   Creatinine 0.61 - 1.24 mg/dL 1.08 0.89 0.79  Sodium  135 - 145 mmol/L 137 135(L) 137  Potassium 3.5 - 5.1 mmol/L 4.0 4.3 3.9  Chloride 98 - 111 mmol/L 106 104 106  CO2 22 - 32 mmol/L 27 23 25   Calcium 8.9 - 10.3 mg/dL 9.2 9.6 9.3  Total Protein 6.5 - 8.1 g/dL 6.8 7.4 7.1  Total Bilirubin 0.3 - 1.2 mg/dL 0.3 0.6 0.5  Alkaline Phos 38 - 126 U/L 62 60 59  AST 15 - 41 U/L 24 29 31   ALT 0 - 44 U/L 22 16 18    CEA 06/13/17: 53.01 07/10/17: 106.44 08/07/17: 31.11 09/04/17: 11.02 10/02/17: 5.64 10/30/17: 4.30  PATHOLOGY  Diagnosis 06/04/17 Stomach, biopsy, Fundus - ADENOCARCINOMA - SEE COMMENT Microscopic Comment The stomach biopsy is of an adenocarcinoma arising in a background of intestinal metaplasia. A Warthin-Starry stain is performed to determine the possibility of the presence of Helicobacter pylori. The Warthin-Starry stain is negative for organisms morphologically consistent with Helicobacter pylori.    RADIOGRAPHIC STUDIES: I have personally reviewed the radiological images as listed and agreed with the findings in the report. No results found.   ASSESSMENT & PLAN: Ryan Collins 57 y.o. male, without significant past medical history, but also does not  see doctors routinely, presented with worsening fatigue, intermittent epigastric pain and melena for 3-4 months, and 50 pounds weight loss over the past 6 months.  1.  Gastric cancer, T4bNxM1 with peritoneal metastasis, adenocarcinoma, HER2(-), MSI-stable, PD-L1positive (5) 2. Anemia of iron deficiency and tumor bleeding, B12 deficiency 3. History of DVT, ? Protein C deficiency  4. History of alcohol and marijuana abuse 5. Weight loss and malnutrition  6. Goals of care discussion  7. Right inguinal hernia   Mr. Bonanno appears stable. He completed cycle 11 FOLFOX. He is tolerating treatment very well overall with mild cold sensitivity. He has no other appreciable side effects. VS and weight are stable. Labs reviewed, adequate for treatment. I recommend to proceed with cycle 11  FOLFOX today, will restage after this cycle. CEA is now in normal range. F/u in 2 weeks to review staging results and next cycle.   PLAN: -Labs reviewed, proceed with cycle 11 FOLFOX today, no dosage adjustments -Return in 2 weeks for imaging results and next cycle -Restaging CT CAP before 7/17, ordered today -Refilled thiamine and compazine    Orders Placed This Encounter  Procedures  . CT Abdomen Pelvis W Contrast    Standing Status:   Future    Standing Expiration Date:   11/13/2018    Order Specific Question:   If indicated for the ordered procedure, I authorize the administration of contrast media per Radiology protocol    Answer:   Yes    Order Specific Question:   Preferred imaging location?    Answer:   [facility]  Order Specific Question:   Is Oral Contrast requested for this exam?    Answer:   Yes, Per Radiology protocol    Order Specific Question:   Radiology Contrast Protocol - do NOT remove file path    Answer:   \\[email or web address]  . CT Chest W Contrast    Standing Status:   Future    Standing Expiration Date:   11/13/2018    Order Specific Question:   If indicated for the ordered procedure, I authorize the administration of contrast media per Radiology protocol    Answer:   Yes    Order Specific Question:   Preferred imaging location?    Answer:   [facility]    Order Specific Question:   Radiology Contrast Protocol - do NOT remove file path    Answer:   \\[email or web address]   All questions were answered. The patient knows to call the clinic with any problems, questions or concerns. No barriers to learning was detected.     Alla Feeling, NP 11/13/17

## 2017-11-13 ENCOUNTER — Inpatient Hospital Stay: Payer: BLUE CROSS/BLUE SHIELD

## 2017-11-13 ENCOUNTER — Telehealth: Payer: Self-pay | Admitting: Nurse Practitioner

## 2017-11-13 ENCOUNTER — Inpatient Hospital Stay: Payer: BLUE CROSS/BLUE SHIELD | Attending: Hematology

## 2017-11-13 ENCOUNTER — Inpatient Hospital Stay (HOSPITAL_BASED_OUTPATIENT_CLINIC_OR_DEPARTMENT_OTHER): Payer: BLUE CROSS/BLUE SHIELD | Admitting: Nurse Practitioner

## 2017-11-13 ENCOUNTER — Encounter: Payer: Self-pay | Admitting: Nurse Practitioner

## 2017-11-13 ENCOUNTER — Other Ambulatory Visit: Payer: BLUE CROSS/BLUE SHIELD

## 2017-11-13 VITALS — BP 115/79 | HR 77 | Temp 98.7°F | Resp 18 | Ht 71.5 in | Wt 176.9 lb

## 2017-11-13 DIAGNOSIS — Z79899 Other long term (current) drug therapy: Secondary | ICD-10-CM

## 2017-11-13 DIAGNOSIS — T451X5S Adverse effect of antineoplastic and immunosuppressive drugs, sequela: Secondary | ICD-10-CM | POA: Insufficient documentation

## 2017-11-13 DIAGNOSIS — C161 Malignant neoplasm of fundus of stomach: Secondary | ICD-10-CM

## 2017-11-13 DIAGNOSIS — D509 Iron deficiency anemia, unspecified: Secondary | ICD-10-CM | POA: Diagnosis not present

## 2017-11-13 DIAGNOSIS — K4090 Unilateral inguinal hernia, without obstruction or gangrene, not specified as recurrent: Secondary | ICD-10-CM

## 2017-11-13 DIAGNOSIS — K769 Liver disease, unspecified: Secondary | ICD-10-CM | POA: Diagnosis not present

## 2017-11-13 DIAGNOSIS — R634 Abnormal weight loss: Secondary | ICD-10-CM | POA: Insufficient documentation

## 2017-11-13 DIAGNOSIS — F1290 Cannabis use, unspecified, uncomplicated: Secondary | ICD-10-CM | POA: Diagnosis not present

## 2017-11-13 DIAGNOSIS — N400 Enlarged prostate without lower urinary tract symptoms: Secondary | ICD-10-CM | POA: Insufficient documentation

## 2017-11-13 DIAGNOSIS — K922 Gastrointestinal hemorrhage, unspecified: Secondary | ICD-10-CM | POA: Diagnosis not present

## 2017-11-13 DIAGNOSIS — I700 Atherosclerosis of aorta: Secondary | ICD-10-CM

## 2017-11-13 DIAGNOSIS — Z86718 Personal history of other venous thrombosis and embolism: Secondary | ICD-10-CM | POA: Diagnosis not present

## 2017-11-13 DIAGNOSIS — C786 Secondary malignant neoplasm of retroperitoneum and peritoneum: Secondary | ICD-10-CM | POA: Diagnosis not present

## 2017-11-13 DIAGNOSIS — E46 Unspecified protein-calorie malnutrition: Secondary | ICD-10-CM | POA: Diagnosis not present

## 2017-11-13 DIAGNOSIS — G620 Drug-induced polyneuropathy: Secondary | ICD-10-CM | POA: Diagnosis not present

## 2017-11-13 DIAGNOSIS — R630 Anorexia: Secondary | ICD-10-CM | POA: Insufficient documentation

## 2017-11-13 DIAGNOSIS — Z5111 Encounter for antineoplastic chemotherapy: Secondary | ICD-10-CM | POA: Diagnosis not present

## 2017-11-13 DIAGNOSIS — Z452 Encounter for adjustment and management of vascular access device: Secondary | ICD-10-CM | POA: Diagnosis not present

## 2017-11-13 DIAGNOSIS — E538 Deficiency of other specified B group vitamins: Secondary | ICD-10-CM | POA: Insufficient documentation

## 2017-11-13 DIAGNOSIS — N281 Cyst of kidney, acquired: Secondary | ICD-10-CM | POA: Diagnosis not present

## 2017-11-13 DIAGNOSIS — K921 Melena: Secondary | ICD-10-CM | POA: Insufficient documentation

## 2017-11-13 LAB — CBC WITH DIFFERENTIAL (CANCER CENTER ONLY)
Basophils Absolute: 0 10*3/uL (ref 0.0–0.1)
Basophils Relative: 0 %
Eosinophils Absolute: 0.1 10*3/uL (ref 0.0–0.5)
Eosinophils Relative: 2 %
HEMATOCRIT: 36.7 % — AB (ref 38.4–49.9)
Hemoglobin: 11.9 g/dL — ABNORMAL LOW (ref 13.0–17.1)
LYMPHS ABS: 1.8 10*3/uL (ref 0.9–3.3)
LYMPHS PCT: 32 %
MCH: 29.3 pg (ref 27.2–33.4)
MCHC: 32.5 g/dL (ref 32.0–36.0)
MCV: 90 fL (ref 79.3–98.0)
Monocytes Absolute: 1.2 10*3/uL — ABNORMAL HIGH (ref 0.1–0.9)
Monocytes Relative: 21 %
Neutro Abs: 2.5 10*3/uL (ref 1.5–6.5)
Neutrophils Relative %: 45 %
Platelet Count: 181 10*3/uL (ref 140–400)
RBC: 4.08 MIL/uL — AB (ref 4.20–5.82)
RDW: 17.5 % — ABNORMAL HIGH (ref 11.0–14.6)
WBC: 5.6 10*3/uL (ref 4.0–10.3)

## 2017-11-13 LAB — CMP (CANCER CENTER ONLY)
ALK PHOS: 62 U/L (ref 38–126)
ALT: 22 U/L (ref 0–44)
AST: 24 U/L (ref 15–41)
Albumin: 3.7 g/dL (ref 3.5–5.0)
Anion gap: 4 — ABNORMAL LOW (ref 5–15)
BUN: 12 mg/dL (ref 6–20)
CALCIUM: 9.2 mg/dL (ref 8.9–10.3)
CO2: 27 mmol/L (ref 22–32)
CREATININE: 1.08 mg/dL (ref 0.61–1.24)
Chloride: 106 mmol/L (ref 98–111)
Glucose, Bld: 95 mg/dL (ref 70–99)
Potassium: 4 mmol/L (ref 3.5–5.1)
Sodium: 137 mmol/L (ref 135–145)
Total Bilirubin: 0.3 mg/dL (ref 0.3–1.2)
Total Protein: 6.8 g/dL (ref 6.5–8.1)

## 2017-11-13 MED ORDER — DEXTROSE 5 % IV SOLN
400.0000 mg/m2 | Freq: Once | INTRAVENOUS | Status: AC
  Administered 2017-11-13: 780 mg via INTRAVENOUS
  Filled 2017-11-13: qty 39

## 2017-11-13 MED ORDER — DEXAMETHASONE SODIUM PHOSPHATE 10 MG/ML IJ SOLN
10.0000 mg | Freq: Once | INTRAMUSCULAR | Status: AC
  Administered 2017-11-13: 10 mg via INTRAVENOUS

## 2017-11-13 MED ORDER — THIAMINE HCL 100 MG PO TABS: 100.0000 mg | ORAL_TABLET | Freq: Every day | ORAL | 3 refills | Status: DC

## 2017-11-13 MED ORDER — DEXTROSE 5 % IV SOLN
Freq: Once | INTRAVENOUS | Status: AC
  Administered 2017-11-13: 11:00:00 via INTRAVENOUS

## 2017-11-13 MED ORDER — PALONOSETRON HCL INJECTION 0.25 MG/5ML
0.2500 mg | Freq: Once | INTRAVENOUS | Status: AC
  Administered 2017-11-13: 0.25 mg via INTRAVENOUS

## 2017-11-13 MED ORDER — PROCHLORPERAZINE MALEATE 10 MG PO TABS: 10.0000 mg | ORAL_TABLET | Freq: Four times a day (QID) | ORAL | 3 refills | Status: DC | PRN

## 2017-11-13 MED ORDER — OXALIPLATIN CHEMO INJECTION 100 MG/20ML
85.0000 mg/m2 | Freq: Once | INTRAVENOUS | Status: AC
  Administered 2017-11-13: 165 mg via INTRAVENOUS
  Filled 2017-11-13: qty 33

## 2017-11-13 MED ORDER — FLUOROURACIL CHEMO INJECTION 2.5 GM/50ML
400.0000 mg/m2 | Freq: Once | INTRAVENOUS | Status: AC
  Administered 2017-11-13: 800 mg via INTRAVENOUS
  Filled 2017-11-13: qty 16

## 2017-11-13 MED ORDER — PALONOSETRON HCL INJECTION 0.25 MG/5ML
INTRAVENOUS | Status: AC
  Filled 2017-11-13: qty 5

## 2017-11-13 MED ORDER — DEXAMETHASONE SODIUM PHOSPHATE 10 MG/ML IJ SOLN
INTRAMUSCULAR | Status: AC
  Filled 2017-11-13: qty 1

## 2017-11-13 MED ORDER — SODIUM CHLORIDE 0.9 % IV SOLN
2400.0000 mg/m2 | INTRAVENOUS | Status: DC
  Administered 2017-11-13: 4700 mg via INTRAVENOUS
  Filled 2017-11-13: qty 94

## 2017-11-13 MED FILL — VITAMIN B-1 100 MG TABS: 100 | 100 days supply | Qty: 100 | Fill #0

## 2017-11-13 NOTE — Patient Instructions (Signed)
[facility] Discharge Instructions for Patients Receiving Chemotherapy  Today you received the following chemotherapy agents oxaliplatin, leucovorin, Adrucil  To help prevent nausea and vomiting after your treatment, we encourage you to take your nausea medication as directed If you develop nausea and vomiting that is not controlled by your nausea medication, call the clinic.   BELOW ARE SYMPTOMS THAT SHOULD BE REPORTED IMMEDIATELY:  *FEVER GREATER THAN 100.5 F  *CHILLS WITH OR WITHOUT FEVER  NAUSEA AND VOMITING THAT IS NOT CONTROLLED WITH YOUR NAUSEA MEDICATION  *UNUSUAL SHORTNESS OF BREATH  *UNUSUAL BRUISING OR BLEEDING  TENDERNESS IN MOUTH AND THROAT WITH OR WITHOUT PRESENCE OF ULCERS  *URINARY PROBLEMS  *BOWEL PROBLEMS  UNUSUAL RASH Items with * indicate a potential emergency and should be followed up as soon as possible.  Feel free to call the clinic should you have any questions or concerns. The clinic phone number is [phone].  Please show the [facility] at check-in to the Emergency Department and triage nurse.

## 2017-11-13 NOTE — Telephone Encounter (Signed)
No LOS 7/3

## 2017-11-15 ENCOUNTER — Telehealth: Payer: Self-pay

## 2017-11-15 ENCOUNTER — Telehealth: Payer: Self-pay | Admitting: Hematology

## 2017-11-15 ENCOUNTER — Inpatient Hospital Stay (HOSPITAL_BASED_OUTPATIENT_CLINIC_OR_DEPARTMENT_OTHER): Payer: BLUE CROSS/BLUE SHIELD

## 2017-11-15 VITALS — BP 124/75 | HR 88 | Temp 98.1°F | Resp 18

## 2017-11-15 DIAGNOSIS — F1290 Cannabis use, unspecified, uncomplicated: Secondary | ICD-10-CM

## 2017-11-15 DIAGNOSIS — E538 Deficiency of other specified B group vitamins: Secondary | ICD-10-CM | POA: Diagnosis not present

## 2017-11-15 DIAGNOSIS — C161 Malignant neoplasm of fundus of stomach: Secondary | ICD-10-CM

## 2017-11-15 DIAGNOSIS — T451X5S Adverse effect of antineoplastic and immunosuppressive drugs, sequela: Secondary | ICD-10-CM

## 2017-11-15 DIAGNOSIS — R634 Abnormal weight loss: Secondary | ICD-10-CM

## 2017-11-15 DIAGNOSIS — D509 Iron deficiency anemia, unspecified: Secondary | ICD-10-CM | POA: Diagnosis not present

## 2017-11-15 DIAGNOSIS — Z79899 Other long term (current) drug therapy: Secondary | ICD-10-CM

## 2017-11-15 DIAGNOSIS — G620 Drug-induced polyneuropathy: Secondary | ICD-10-CM

## 2017-11-15 DIAGNOSIS — C786 Secondary malignant neoplasm of retroperitoneum and peritoneum: Secondary | ICD-10-CM | POA: Diagnosis not present

## 2017-11-15 DIAGNOSIS — I700 Atherosclerosis of aorta: Secondary | ICD-10-CM

## 2017-11-15 DIAGNOSIS — N281 Cyst of kidney, acquired: Secondary | ICD-10-CM

## 2017-11-15 DIAGNOSIS — K921 Melena: Secondary | ICD-10-CM

## 2017-11-15 DIAGNOSIS — K922 Gastrointestinal hemorrhage, unspecified: Secondary | ICD-10-CM

## 2017-11-15 DIAGNOSIS — E46 Unspecified protein-calorie malnutrition: Secondary | ICD-10-CM

## 2017-11-15 DIAGNOSIS — Z86718 Personal history of other venous thrombosis and embolism: Secondary | ICD-10-CM

## 2017-11-15 DIAGNOSIS — R630 Anorexia: Secondary | ICD-10-CM

## 2017-11-15 DIAGNOSIS — K4090 Unilateral inguinal hernia, without obstruction or gangrene, not specified as recurrent: Secondary | ICD-10-CM

## 2017-11-15 DIAGNOSIS — N400 Enlarged prostate without lower urinary tract symptoms: Secondary | ICD-10-CM

## 2017-11-15 DIAGNOSIS — K769 Liver disease, unspecified: Secondary | ICD-10-CM

## 2017-11-15 MED ORDER — SODIUM CHLORIDE 0.9% FLUSH
10.0000 mL | INTRAVENOUS | Status: DC | PRN
  Administered 2017-11-15: 10 mL
  Filled 2017-11-15: qty 10

## 2017-11-15 MED ORDER — HEPARIN SOD (PORK) LOCK FLUSH 100 UNIT/ML IV SOLN
500.0000 [IU] | Freq: Once | INTRAVENOUS | Status: AC | PRN
  Administered 2017-11-15: 500 [IU]
  Filled 2017-11-15: qty 5

## 2017-11-15 NOTE — Telephone Encounter (Signed)
Appointments added abut I was unable to leave message on voicemail due to no VM being set up. Letter/Calendar mailed to patient per 7/5 sch msg

## 2017-11-15 NOTE — Telephone Encounter (Signed)
Attempted to reach patient to notify him about a scheduled CT scan for Monday 7/15 at 3:00 pm to arrive at 2:45 at [place] 4 hours prior. He will need to pick up prep prior to this.   Will try this call later.

## 2017-11-18 ENCOUNTER — Other Ambulatory Visit: Payer: Self-pay | Admitting: Nurse Practitioner

## 2017-11-18 ENCOUNTER — Telehealth: Payer: Self-pay

## 2017-11-18 DIAGNOSIS — C169 Malignant neoplasm of stomach, unspecified: Secondary | ICD-10-CM

## 2017-11-18 NOTE — Telephone Encounter (Signed)
Spoke to patient notified him we have scheduled him for CT scan on Monday 7/15 at 3:00 pm to arrive at 2:45 at [facility], NPO for 4 hours prior and instructed him to come by this week and pick up the prep he needs to drink.  Patient verbalized an understanding.

## 2017-11-18 NOTE — Progress Notes (Signed)
[place]  [phone] [phone]  Clinic Follow Up Note   Patient Care Team: [name], FNP as PCP - General (Family Medicine) 11/27/2017  CHIEF COMPLAINTS:  Follow up gastric Cancer  Oncology History   Cancer Staging Gastric cancer [facility]) Staging form: Stomach, AJCC 8th Edition - Clinical stage from 06/04/2017: Stage IVB (cT4, cN0, cM1) - Signed by [name], MD on 06/10/2017       Gastric cancer ([place])   06/03/2017 - 06/06/2017 Hospital Admission    Admitted to the hospital on 06/03/17 with complaints of fatigue and melena. During his stay he underwent an endoscopy that revealed a malignant gastric tumor in the cardia and in the gastric fundus. Biopsy results revealed adenocarcinoma. Pt was discharged on 06/06/17.      06/04/2017 Initial Biopsy    Diagnosis 06/04/17 Stomach, biopsy, Fundus - ADENOCARCINOMA - SEE COMMENT Microscopic Comment The stomach biopsy is of an adenocarcinoma arising in a background of intestinal metaplasia. A Warthin-Starry stain is performed to determine the possibility of the presence of Helicobacter pylori. The Warthin-Starry stain is negative for organisms morphologically consistent with Helicobacter pylori.      06/04/2017 Procedure    Esophagogastroduodenoscopy 06/04/17 by Dr. Benson Norway Impression: Normal esophagus. Malignant gastric tumor in the cardia and in the  gastric fundus. Biopsied. Normal examined duodenum.      06/04/2017 Miscellaneous    HER2 (-) EBV (-) MSI-S PD-L1 CPS 5%      06/05/2017 Imaging    CT CAP W Contrast 06/05/17 IMPRESSION: 9.1 cm fungating mass in the gastric cardia/posterior gastric fundus, corresponding to the patient's newly diagnosed gastric Cancer. Associated extra gastric extension with peritoneal disease in the left upper abdomen, including a dominant 4.2 cm peritoneal implant. Small volume pelvic ascites. 1.8 cm hypoenhancing lesion along the inferior spleen,  indeterminate.      06/10/2017 Initial Diagnosis    Gastric cancer ([place])      06/26/2017 -  Chemotherapy    First line chemo FOLFOX every 2 weeks        08/29/2017 Imaging    IMPRESSION: 1. Interval decrease in size of fungating ulcerative mass arising from the posterior gastric fundus. 2. Left peritoneal implant, compatible with peritoneal carcinomatosis, has decreased in size from the previous exam. 3. Right lobe of liver lesion present on previous exam is less conspicuous on today's study. A new lesion is identified within the left lobe of liver, suspicious for metastasis. Additional focus of low attenuation within segment 5 adjacent to the gallbladder fossa may represent focal fatty deposition. 4. Similar appearance of mildly complex cyst containing mural calcification and possible internal septation arising from the inferior pole of right kidney. More definitive characterization of this lesion with renal protocol MRI may be helpful. 5.  Mild prostate gland enlargement. 6.  Aortic Atherosclerosis (ICD10-I70.0).      11/25/2017 Imaging    11/25/2017 CT AP W Contrast IMPRESSION: 1. Mixed appearance, with the gastric fundal mass thicker than it was on 08/29/2017 (although still improved compared to 06/05/2017), but with reduced local conglomerate adenopathy along the splenic hilum compared to 08/29/2017. 2. At this time I do not see any definite hepatic metastatic lesions. Several lesions of suspicion in the left hepatic lobe and inferiorly in the right hepatic lobe are not readily visible today. There is a small focus of hypodensity adjacent to the gallbladder fossa in segment 7 which is technically nonspecific but which could reflect focal fatty infiltration. 3. Increase in nodular  contour of scarring in the left lower lobe. This is probably incidental but I cannot completely exclude the possibility of a localized metastatic lesion. Surveillance of the left lower lobe  suggested. 4. Bosniak category 74F cyst of the right kidney lower pole with internal septation and calcification. Surveillance of this lesion is suggested; I suspect that the patient's cancer surveillance will supersede the typical recommended surveillance schedule for complex cyst. 5. Other imaging findings of potential clinical significance: Aortic Atherosclerosis (ICD10-I70.0). Degenerative glenohumeral arthropathy bilaterally. Mild impingement at L4-5 and L5-S1.        HISTORY OF PRESENTING ILLNESS:  Ryan Collins 57 y.o. male is a here because of newly diagnosed gastric cancer. The patient was referred by hospitalist after her recent hospital discharge.  The patient presents to the clinic today by himself.   Pt went to urgent care on 05/15/17 with complaints of worsening fatigue, central abdominal pain, black stool for 4 months, and 50 lbs weight loss in the past 6 months. discomfort over the past few months. He described the pain as sharp and dull that is worse after he eats. The pain would last 30-40 minutes after eating. Pt was then seen in the ED and admitted to the hospital on 06/03/17 with complaints of fatigue and melena. During his stay he underwent an endoscopy that revealed a malignant gastric tumor in the cardia and in the gastric fundus. Biopsy of the mass revealed adenocarcinoma.  Patient received blood transfusion and 1 dose of IV Feraheme, tolerated well.  His fatigue has much improved.  Pt was dischared on 06/06/17.    Patient is single, no children, lives with his brother in a trailer, he has multiple brothers and sisters and some of them live in the town.  Her mother also lives in [place].  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  He is concerned about his finances if he is not able to continue working.   CURRENT THERAPY: first line chemo FOLFOX every 2 weeks, started on 06/26/2017  INTERIM HISTORY:  [name] returns for follow-up and Cycle 12  FOLFOX. He presents to the clinic today by himself in the clinic. He has been doing well overall. He complains of left abdominal tingling and right inguinal hernia pain. He is not following up with a dietician, and still experiences episodes on loss of appetite. He is able to perform his daily activities normally.     MEDICAL HISTORY:  Past Medical History:  Diagnosis Date  . DVT (deep venous thrombosis) ([place]) 2011  . GI bleeding 2001  . History of DVT of lower extremity   . Melena 05/2017  . Stomach ulcer    Clinically suspected; No EGD confirmation as of 06/03/17    SURGICAL HISTORY: Past Surgical History:  Procedure Laterality Date  . ESOPHAGOGASTRODUODENOSCOPY  06/04/2017  . ESOPHAGOGASTRODUODENOSCOPY N/A 06/04/2017   Procedure: ESOPHAGOGASTRODUODENOSCOPY (EGD);  Surgeon: Carol Ada, MD;  Location: [facility];  Service: Endoscopy;  Laterality: N/A;  . IR FLUORO GUIDE PORT INSERTION RIGHT  06/20/2017  . IR US GUIDE VASC ACCESS RIGHT  06/20/2017    SOCIAL HISTORY: Social History   Socioeconomic History  . Marital status: Legally Separated    Spouse name: Not on file  . Number of children: Not on file  . Years of education: Not on file  . Highest education level: Not on file  Occupational History  . Not on file  Social Needs  . Financial resource strain: Not on file  .  Food insecurity:    Worry: Not on file    Inability: Not on file  . Transportation needs:    Medical: Not on file    Non-medical: Not on file  Tobacco Use  . Smoking status: Never Smoker  . Smokeless tobacco: Never Used  Substance and Sexual Activity  . Alcohol use: Yes    Alcohol/week: 3.0 oz    Types: 5 Cans of beer per week    Comment: 05/2017 i QUIT DRINKING 4 MONTHS AGO "  . Drug use: Yes    Types: Marijuana    Comment: occ  . Sexual activity: Not on file  Lifestyle  . Physical activity:    Days per week: Not on file    Minutes per session: Not on file  . Stress: Not on file   Relationships  . Social connections:    Talks on phone: Not on file    Gets together: Not on file    Attends religious service: Not on file    Active member of club or organization: Not on file    Attends meetings of clubs or organizations: Not on file    Relationship status: Not on file  . Intimate partner violence:    Fear of current or ex partner: Not on file    Emotionally abused: Not on file    Physically abused: Not on file    Forced sexual activity: Not on file  Other Topics Concern  . Not on file  Social History Narrative  . Not on file    FAMILY HISTORY: Family History  Problem Relation Age of Onset  . Hypertension Mother   . Cancer Sister 54       breast    ALLERGIES:  has No Known Allergies.  MEDICATIONS:  Current Outpatient Medications  Medication Sig Dispense Refill  . acetaminophen (TYLENOL) 500 MG tablet Take 1,000 mg by mouth every 6 (six) hours as needed for mild pain.     Marland Kitchen amLODipine (NORVASC) 5 MG tablet Take 1 tablet (5 mg total) by mouth daily. 30 tablet 5  . Blood Pressure Monitoring (BLOOD PRESSURE MONITOR/M CUFF) MISC 1 each by Does not apply route daily. 1 each 0  . lidocaine-prilocaine (EMLA) cream Apply 1 application topically as needed (for port).    . ondansetron (ZOFRAN) 8 MG tablet Take 8 mg by mouth 2 (two) times daily as needed for refractory nausea / vomiting. Start on day 3 after chemo    . pantoprazole (PROTONIX) 40 MG tablet Take 1 tablet (40 mg total) by mouth 2 (two) times daily. 60 tablet 2  . prochlorperazine (COMPAZINE) 10 MG tablet Take 1 tablet (10 mg total) by mouth every 6 (six) hours as needed for nausea or vomiting. 30 tablet 3  . thiamine 100 MG tablet Take 1 tablet (100 mg total) by mouth daily. 30 tablet 3  . vitamin B-12 (CYANOCOBALAMIN) 1000 MCG tablet Take 1 tablet (1,000 mcg total) by mouth daily. 30 tablet 0   No current facility-administered medications for this visit.    Facility-Administered Medications Ordered  in Other Visits  Medication Dose Route Frequency Provider Last Rate Last Dose  . fluorouracil (ADRUCIL) 4,700 mg in sodium chloride 0.9 % 56 mL chemo infusion  2,400 mg/m2 (Treatment Plan Recorded) Intravenous 1 day or 1 dose [name], MD   4,700 mg at 11/27/17 1533  . heparin lock flush 100 unit/mL  500 Units Intracatheter Once PRN [name], MD      .  sodium chloride flush (NS) 0.9 % injection 10 mL  10 mL Intracatheter PRN [name], MD        REVIEW OF SYSTEMS:  Constitutional: Denies fevers, chills or abnormal night sweats, (+) over 50 pound weight loss in the past 6 months, improving, (+) fatigue  Eyes: Denies blurriness of vision, double vision or watery eyes Ears, nose, mouth, throat, and face: Denies mucositis or sore throat Respiratory: Denies cough, dyspnea or wheezes Cardiovascular: Denies palpitation, chest discomfort or lower extremity swelling Gastrointestinal:  Denies nausea, heartburn (+) intermittent epigastric pain (+) right inguinal hernia (+) left lower abdominal tingling Skin: Denies abnormal skin rashes Lymphatics: Denies new lymphadenopathy or easy bruising Neurological:Denies numbness, tingling or new weaknesses Behavioral/Psych: Mood is stable, no new changes  All other systems were reviewed with the patient and are negative.  PHYSICAL EXAMINATION:  ECOG PERFORMANCE STATUS: 1 - Symptomatic but completely ambulatory   Today's Vitals   11/27/17 1009  BP: 127/85  Pulse: 73  Resp: 18  Temp: 98.9 F (37.2 C)  TempSrc: Oral  SpO2: 100%  Weight: 173 lb 8 oz (78.7 kg)  Height: 5' 11.5" (1.816 m)  PainSc: 0-No pain   GENERAL:alert, no distress and comfortable SKIN: skin color, texture, turgor are normal, no rashes or significant lesions EYES: normal, conjunctiva are pink and non-injected, sclera clear OROPHARYNX:no exudate, no erythema and lips, buccal mucosa, and tongue normal  NECK: supple, thyroid normal size, non-tender, without nodularity LYMPH:  no  palpable lymphadenopathy in the cervical, axillary or inguinal LUNGS: clear to auscultation and percussion with normal breathing effort HEART: regular rate & rhythm and no murmurs and no lower extremity edema ABDOMEN:abdomen soft, non-tender and normal bowel sounds (+) right inguinal hernia Musculoskeletal:no cyanosis of digits and no clubbing  PSYCH: alert & oriented x 3 with fluent speech NEURO: no focal motor/sensory deficits  LABORATORY DATA:  I have reviewed the data as listed CBC Latest Ref Rng & Units 11/27/2017 11/13/2017 10/30/2017  WBC 4.0 - 10.3 K/uL 6.2 5.6 6.0  Hemoglobin 13.0 - 17.1 g/dL 12.3(L) 11.9(L) 12.7(L)  Hematocrit 38.4 - 49.9 % 36.7(L) 36.7(L) 38.2(L)  Platelets 140 - 400 K/uL 182 181 199    CMP Latest Ref Rng & Units 11/27/2017 11/13/2017 10/30/2017  Glucose 70 - 99 mg/dL 88 95 94  BUN 6 - 20 mg/dL 7 12 14   Creatinine 0.61 - 1.24 mg/dL 0.78 1.08 0.89  Sodium 135 - 145 mmol/L 135 137 135(L)  Potassium 3.5 - 5.1 mmol/L 3.8 4.0 4.3  Chloride 98 - 111 mmol/L 104 106 104  CO2 22 - 32 mmol/L 26 27 23   Calcium 8.9 - 10.3 mg/dL 9.2 9.2 9.6  Total Protein 6.5 - 8.1 g/dL 6.9 6.8 7.4  Total Bilirubin 0.3 - 1.2 mg/dL 0.5 0.3 0.6  Alkaline Phos 38 - 126 U/L 62 62 60  AST 15 - 41 U/L 22 24 29   ALT 0 - 44 U/L 19 22 16    CEA 06/13/17: 53.01 07/10/17: 106.44 08/07/17: 31.11 09/04/17: 11.02 10/02/17: 5.64 10/30/17: 4.31  PATHOLOGY  11/25/2017 CT AP W Contrast IMPRESSION: 1. Mixed appearance, with the gastric fundal mass thicker than it was on 08/29/2017 (although still improved compared to 06/05/2017), but with reduced local conglomerate adenopathy along the splenic hilum compared to 08/29/2017. 2. At this time I do not see any definite hepatic metastatic lesions. Several lesions of suspicion in the left hepatic lobe and inferiorly in the right hepatic lobe are not readily visible today. There is a  small focus of hypodensity adjacent to the gallbladder fossa in segment 7  which is technically nonspecific but which could reflect focal fatty infiltration. 3. Increase in nodular contour of scarring in the left lower lobe. This is probably incidental but I cannot completely exclude the possibility of a localized metastatic lesion. Surveillance of the left lower lobe suggested. 4. Bosniak category 49F cyst of the right kidney lower pole with internal septation and calcification. Surveillance of this lesion is suggested; I suspect that the patient's cancer surveillance will supersede the typical recommended surveillance schedule for complex cyst. 5. Other imaging findings of potential clinical significance: Aortic Atherosclerosis (ICD10-I70.0). Degenerative glenohumeral arthropathy bilaterally. Mild impingement at L4-5 and L5-S1.  Diagnosis 06/04/17 Stomach, biopsy, Fundus - ADENOCARCINOMA - SEE COMMENT Microscopic Comment The stomach biopsy is of an adenocarcinoma arising in a background of intestinal metaplasia. A Warthin-Starry stain is performed to determine the possibility of the presence of Helicobacter pylori. The Warthin-Starry stain is negative for organisms morphologically consistent with Helicobacter pylori.  RADIOGRAPHIC STUDIES: I have personally reviewed the radiological images as listed and agreed with the findings in the report. Ct Chest W Contrast  Result Date: 11/25/2017 CLINICAL DATA:  Restaging of gastric fundus adenocarcinoma, chemotherapy in progress EXAM: CT CHEST, ABDOMEN, AND PELVIS WITH CONTRAST TECHNIQUE: Multidetector CT imaging of the chest, abdomen and pelvis was performed following the standard protocol during bolus administration of intravenous contrast. CONTRAST:  138m ISOVUE-300 IOPAMIDOL (ISOVUE-300) INJECTION 61% COMPARISON:  Multiple exams, including 08/29/2017 FINDINGS: CT CHEST FINDINGS Cardiovascular: Right Port-A-Cath tip: Upper right atrium. Atherosclerotic calcification of the aortic arch. Mediastinum/Nodes: Small  mediastinal and hilar nodes are not pathologically enlarged by size criteria. Lungs/Pleura: Bandlike scarring in the left lower lobe has a slightly more nodular component, currently measuring 1.1 by 0.7 cm on image 115/6, previously only up to about 5 mm in thickness and previously with a less nodular appearance. Small filling defect in the right side of the lower trachea on image 42/6, probably from mucus. Similar small filling defects along the right mainstem bronchus along with some subtle frothy material in the left main bronchus. Musculoskeletal: Degenerative glenohumeral arthropathy, left greater than right. Mid and lower thoracic spondylosis and degenerative disc disease. CT ABDOMEN PELVIS FINDINGS Hepatobiliary: 0.8 cm hypodense lesion in segment 5 of the liver on image 71/2, previously 1.0 cm in diameter. The lesion of concern along the upper margin of the falciform ligament on 08/29/2017 is not well seen on today's exam. Inferior right hepatic lobe lesion suggested on prior exams not well appreciated today. No new lesions are identified. Mildly contracted gallbladder. Pancreas: Unremarkable Spleen: Unremarkable Adrenals/Urinary Tract: Adrenal glands normal. There is a complex lesion of the right kidney lower pole with septations and calcification measuring 2.0 by 1.5 by 1.4 cm. Stomach/Bowel: The fundal mass measures up to 3.0 cm in thickness on image 75/4 today, previously 1.8 cm on 08/29/2017 and previously 4.3 cm in thickness on 06/05/2017. Vascular/Lymphatic: Aortoiliac atherosclerotic vascular disease. Near the gastric fundus there are is a slightly enlarged cluster of lymph nodes above the pancreatic tail and along the splenic vessels measuring 1.9 by 1.1 cm on image 128/5, previously 2.7 by 1.5 cm on the exam from 08/29/2017. Reproductive: Unremarkable Other: Unremarkable Musculoskeletal: 3 mm of grade 1 degenerative anterolisthesis at L4-5. Lower lumbar spondylosis and degenerative disc disease  with resulting impingement L4-5 and L5-S1. IMPRESSION: 1. Mixed appearance, with the gastric fundal mass thicker than it was on 08/29/2017 (although still improved compared to 06/05/2017), but  with reduced local conglomerate adenopathy along the splenic hilum compared to 08/29/2017. 2. At this time I do not see any definite hepatic metastatic lesions. Several lesions of suspicion in the left hepatic lobe and inferiorly in the right hepatic lobe are not readily visible today. There is a small focus of hypodensity adjacent to the gallbladder fossa in segment 7 which is technically nonspecific but which could reflect focal fatty infiltration. 3. Increase in nodular contour of scarring in the left lower lobe. This is probably incidental but I cannot completely exclude the possibility of a localized metastatic lesion. Surveillance of the left lower lobe suggested. 4. Bosniak category 5F cyst of the right kidney lower pole with internal septation and calcification. Surveillance of this lesion is suggested; I suspect that the patient's cancer surveillance will supersede the typical recommended surveillance schedule for complex cyst. 5. Other imaging findings of potential clinical significance: Aortic Atherosclerosis (ICD10-I70.0). Degenerative glenohumeral arthropathy bilaterally. Mild impingement at L4-5 and L5-S1. Electronically Signed   By: Van Clines M.D.   On: 11/25/2017 18:04   Ct Abdomen Pelvis W Contrast  Result Date: 11/25/2017 CLINICAL DATA:  Restaging of gastric fundus adenocarcinoma, chemotherapy in progress EXAM: CT CHEST, ABDOMEN, AND PELVIS WITH CONTRAST TECHNIQUE: Multidetector CT imaging of the chest, abdomen and pelvis was performed following the standard protocol during bolus administration of intravenous contrast. CONTRAST:  126m ISOVUE-300 IOPAMIDOL (ISOVUE-300) INJECTION 61% COMPARISON:  Multiple exams, including 08/29/2017 FINDINGS: CT CHEST FINDINGS Cardiovascular: Right Port-A-Cath  tip: Upper right atrium. Atherosclerotic calcification of the aortic arch. Mediastinum/Nodes: Small mediastinal and hilar nodes are not pathologically enlarged by size criteria. Lungs/Pleura: Bandlike scarring in the left lower lobe has a slightly more nodular component, currently measuring 1.1 by 0.7 cm on image 115/6, previously only up to about 5 mm in thickness and previously with a less nodular appearance. Small filling defect in the right side of the lower trachea on image 42/6, probably from mucus. Similar small filling defects along the right mainstem bronchus along with some subtle frothy material in the left main bronchus. Musculoskeletal: Degenerative glenohumeral arthropathy, left greater than right. Mid and lower thoracic spondylosis and degenerative disc disease. CT ABDOMEN PELVIS FINDINGS Hepatobiliary: 0.8 cm hypodense lesion in segment 5 of the liver on image 71/2, previously 1.0 cm in diameter. The lesion of concern along the upper margin of the falciform ligament on 08/29/2017 is not well seen on today's exam. Inferior right hepatic lobe lesion suggested on prior exams not well appreciated today. No new lesions are identified. Mildly contracted gallbladder. Pancreas: Unremarkable Spleen: Unremarkable Adrenals/Urinary Tract: Adrenal glands normal. There is a complex lesion of the right kidney lower pole with septations and calcification measuring 2.0 by 1.5 by 1.4 cm. Stomach/Bowel: The fundal mass measures up to 3.0 cm in thickness on image 75/4 today, previously 1.8 cm on 08/29/2017 and previously 4.3 cm in thickness on 06/05/2017. Vascular/Lymphatic: Aortoiliac atherosclerotic vascular disease. Near the gastric fundus there are is a slightly enlarged cluster of lymph nodes above the pancreatic tail and along the splenic vessels measuring 1.9 by 1.1 cm on image 128/5, previously 2.7 by 1.5 cm on the exam from 08/29/2017. Reproductive: Unremarkable Other: Unremarkable Musculoskeletal: 3 mm of  grade 1 degenerative anterolisthesis at L4-5. Lower lumbar spondylosis and degenerative disc disease with resulting impingement L4-5 and L5-S1. IMPRESSION: 1. Mixed appearance, with the gastric fundal mass thicker than it was on 08/29/2017 (although still improved compared to 06/05/2017), but with reduced local conglomerate adenopathy along the splenic hilum  compared to 08/29/2017. 2. At this time I do not see any definite hepatic metastatic lesions. Several lesions of suspicion in the left hepatic lobe and inferiorly in the right hepatic lobe are not readily visible today. There is a small focus of hypodensity adjacent to the gallbladder fossa in segment 7 which is technically nonspecific but which could reflect focal fatty infiltration. 3. Increase in nodular contour of scarring in the left lower lobe. This is probably incidental but I cannot completely exclude the possibility of a localized metastatic lesion. Surveillance of the left lower lobe suggested. 4. Bosniak category 82F cyst of the right kidney lower pole with internal septation and calcification. Surveillance of this lesion is suggested; I suspect that the patient's cancer surveillance will supersede the typical recommended surveillance schedule for complex cyst. 5. Other imaging findings of potential clinical significance: Aortic Atherosclerosis (ICD10-I70.0). Degenerative glenohumeral arthropathy bilaterally. Mild impingement at L4-5 and L5-S1. Electronically Signed   By: Van Clines M.D.   On: 11/25/2017 18:04   CT CAP W Contrast 06/05/17 IMPRESSION: 9.1 cm fungating mass in the gastric cardia/posterior gastric fundus, corresponding to the patient's newly diagnosed gastric Cancer. Associated extra gastric extension with peritoneal disease in the left upper abdomen, including a dominant 4.2 cm peritoneal implant. Small volume pelvic ascites. 1.8 cm hypoenhancing lesion along the inferior spleen,  indeterminate.  PROCEDURE  Esophagogastroduodenoscopy 06/04/17 by Dr. Benson Norway Findings: The esophagus was normal. A large, fungating and ulcerated, non-circumferential mass with no bleeding and no stigmata of recent bleeding was found in the cardia and in the gastric fundus. Biopsies were taken with a cold forceps for histology. The examined duodenum was normal. A large friable fungating gastric mass involving the cardia and the fundus was identified. The mass extended approximately 5 cm. It was difficult to measure the width of the lesion. There was a large necrotic and deep ulcer in the mid portion of the mass. It is estimated that it encompasses 25% of the fundic circumfirence. Multiple cold biopsies were obtained. Impression: Normal esophagus. Malignant gastric tumor in the cardia and in the  gastric fundus. Biopsied. Normal examined duodenum.  ASSESSMENT & PLAN:  Ryan Collins 57 y.o. male, without significant past medical history, but also does not  see doctors routinely, presented with worsening fatigue, intermittent epigastric pain and melena for 3-4 months, and 50 pounds weight loss over the past 6 months.  1.  Gastric cancer, T4bNxM1 with peritoneal metastasis, adenocarcinoma, HER2(-), MSI-stable, PD-L1positive (5%) -I previously reviewed his CT scan findings, endoscopy and biopsy results of the gastric mass with patient in details. He has a large mass in the cardia and in the gastric fundus, with evidence of extra gastric extension, and the peritoneum metastasis on CT scan.  No other distant metastasis. -Due to the peritoneal metastasis, unfortunately his cancer is not curable at this stage. We previously discussed the role of HIPEC if he has excellent response to chemotherapy, and no evidence of other metastasis, I may consider refer him to Dr. Lennie Odor in the future. Although due to the aggressive nature of gastric cancer, HIPEC is usually not offered.  -He started first-line chemotherapy with  FOLFOX on 06/26/17, he has been tolerating well so far. -Goal of therapy is palliative. -His tumor is negative for HER-2, MSI-stable, PD-L1positive (5).  He would be a candidate for immunotherapy as third line -His tumor marker has been trending down, CEA 11.02 on 09/04/17.  -His CT CAP W Contrast from 08/29/17 revealed a good partial response except one  indeterminate lesion in left lobe of liver. I have personally reviewed his recent restaging CT, and discussed with radiologist Dr. [name]. I agree with his interpretation. I recommended continuing chemo at that time. Pt agreed. -he has a right inguinal hernia onset in Jan 2019 and expressed interest in having it repaired. -He is clinically doing very well, his abdominal pain has near resolved, tolerating chemo treatment well, tumor marker CA has dropped to normal. -I personally reviewed his restaging CT scan from November 25, 2017 and explained to pt, which showed overall improvement compared to January 2019, but is a gastric fundal mass is slightly thicker than it was on April 2019.  No significant peritoneal disease or other metastasis. No definitive evidence of disease progression, and he is clinically doing well, I recommend continuing chemo. -I will review his scan in our GI tumor board next week, to see if he would be a candidate for debulking and HIPEC, and may send him to Dr. [name]  -Lab reviewed, adequate for treatment, will continue FOLFOX every 2 weeks.  2.  Anemia of iron deficiency and tumor bleeding, B12 deficiency -He has clinical GI bleeding with melena, iron study also showed iron deficiency. -He has received blood transfusion, and 2 dose of IV Feraheme, repeated iron level was adequate.   -We continue to monitor his CBC and iron studies closely -He will continue Oral-B complex   3. History of DVT, ?  Protein C deficiency -he was on coumadin for his DVT before, and previous labs showed a protein C deficiency, which increases his risk of  thrombosis. -We previously discussed the high risk of thrombosis secondary to his underlying malignancy. -Due to his chronic GI bleeding from his tumor, I would not do prophylactic anticoagulation.  4. History of alcohol and marijuana abuse -He has quit drinking alcohol since his diagnosis of cancer -He does smoke marijuana occasionally  5. Weight loss and malnutrition  -He has lost significant weight since the diagnosis -Previously encouraged him to increase his nutrition supplement.  He has been seen by our dietitian, will follow-up next week. -He is gaining weight now   6. Goal of care discussion  -We previously discussed the incurable nature of his cancer, and the overall poor prognosis, especially if he does not have good response to chemotherapy or progress on chemo -The patient understands the goal of care is palliative. -he is full code now   7.  Right inguinal hernia -I referred him to general surgeon for consideration of right inguinal hernia repair.     PLAN: -Labs reviewed, proceed with cycle 12 FOLFOX today, continue q2 weeks -We will present his case in our GI tumor board next week   All questions were answered. The patient knows to call the clinic with any problems, questions or concerns.  I spent 20 minutes counseling the patient face to face. The total time spent in the appointment was 25 minutes and more than 50% was on counseling.  Dierdre Searles Dweik am acting as scribe for Dr. [name].  I have reviewed the above documentation for accuracy and completeness, and I agree with the above.    [name], MD 11/27/2017 5:43 PM

## 2017-11-20 ENCOUNTER — Telehealth: Payer: Self-pay

## 2017-11-20 NOTE — Telephone Encounter (Signed)
Patient stop by to pick up contrast. Per 7/10 walk in

## 2017-11-25 ENCOUNTER — Ambulatory Visit (HOSPITAL_COMMUNITY)
Admission: RE | Admit: 2017-11-25 | Discharge: 2017-11-25 | Disposition: A | Discharge status: Home / Self Care | Payer: BLUE CROSS/BLUE SHIELD | Source: Ambulatory Visit | Attending: Nurse Practitioner | Admitting: Nurse Practitioner

## 2017-11-25 DIAGNOSIS — I700 Atherosclerosis of aorta: Secondary | ICD-10-CM | POA: Insufficient documentation

## 2017-11-25 DIAGNOSIS — N281 Cyst of kidney, acquired: Secondary | ICD-10-CM | POA: Diagnosis not present

## 2017-11-25 DIAGNOSIS — C161 Malignant neoplasm of fundus of stomach: Secondary | ICD-10-CM | POA: Diagnosis not present

## 2017-11-25 DIAGNOSIS — R918 Other nonspecific abnormal finding of lung field: Secondary | ICD-10-CM | POA: Insufficient documentation

## 2017-11-25 DIAGNOSIS — M1289 Other specific arthropathies, not elsewhere classified, multiple sites: Secondary | ICD-10-CM | POA: Diagnosis not present

## 2017-11-25 DIAGNOSIS — J984 Other disorders of lung: Secondary | ICD-10-CM | POA: Diagnosis not present

## 2017-11-25 DIAGNOSIS — K769 Liver disease, unspecified: Secondary | ICD-10-CM | POA: Diagnosis not present

## 2017-11-25 MED ORDER — IOPAMIDOL (ISOVUE-300) INJECTION 61%
100.0000 mL | Freq: Once | INTRAVENOUS | Status: AC | PRN
  Administered 2017-11-25: 100 mL via INTRAVENOUS

## 2017-11-25 MED ORDER — IOPAMIDOL (ISOVUE-300) INJECTION 61%
INTRAVENOUS | Status: AC
  Filled 2017-11-25: qty 100

## 2017-11-27 ENCOUNTER — Inpatient Hospital Stay: Payer: BLUE CROSS/BLUE SHIELD

## 2017-11-27 ENCOUNTER — Inpatient Hospital Stay (HOSPITAL_BASED_OUTPATIENT_CLINIC_OR_DEPARTMENT_OTHER): Payer: BLUE CROSS/BLUE SHIELD | Admitting: Hematology

## 2017-11-27 ENCOUNTER — Encounter: Payer: Self-pay | Admitting: Hematology

## 2017-11-27 VITALS — BP 127/85 | HR 73 | Temp 98.9°F | Resp 18 | Ht 71.5 in | Wt 173.5 lb

## 2017-11-27 DIAGNOSIS — R630 Anorexia: Secondary | ICD-10-CM

## 2017-11-27 DIAGNOSIS — K4090 Unilateral inguinal hernia, without obstruction or gangrene, not specified as recurrent: Secondary | ICD-10-CM

## 2017-11-27 DIAGNOSIS — C161 Malignant neoplasm of fundus of stomach: Secondary | ICD-10-CM | POA: Diagnosis not present

## 2017-11-27 DIAGNOSIS — F1290 Cannabis use, unspecified, uncomplicated: Secondary | ICD-10-CM

## 2017-11-27 DIAGNOSIS — Z86718 Personal history of other venous thrombosis and embolism: Secondary | ICD-10-CM

## 2017-11-27 DIAGNOSIS — N400 Enlarged prostate without lower urinary tract symptoms: Secondary | ICD-10-CM

## 2017-11-27 DIAGNOSIS — E538 Deficiency of other specified B group vitamins: Secondary | ICD-10-CM | POA: Diagnosis not present

## 2017-11-27 DIAGNOSIS — D509 Iron deficiency anemia, unspecified: Secondary | ICD-10-CM

## 2017-11-27 DIAGNOSIS — K922 Gastrointestinal hemorrhage, unspecified: Secondary | ICD-10-CM

## 2017-11-27 DIAGNOSIS — D500 Iron deficiency anemia secondary to blood loss (chronic): Secondary | ICD-10-CM

## 2017-11-27 DIAGNOSIS — E46 Unspecified protein-calorie malnutrition: Secondary | ICD-10-CM

## 2017-11-27 DIAGNOSIS — K769 Liver disease, unspecified: Secondary | ICD-10-CM

## 2017-11-27 DIAGNOSIS — N281 Cyst of kidney, acquired: Secondary | ICD-10-CM

## 2017-11-27 DIAGNOSIS — Z79899 Other long term (current) drug therapy: Secondary | ICD-10-CM

## 2017-11-27 DIAGNOSIS — R634 Abnormal weight loss: Secondary | ICD-10-CM

## 2017-11-27 DIAGNOSIS — C169 Malignant neoplasm of stomach, unspecified: Secondary | ICD-10-CM

## 2017-11-27 DIAGNOSIS — K921 Melena: Secondary | ICD-10-CM

## 2017-11-27 DIAGNOSIS — C786 Secondary malignant neoplasm of retroperitoneum and peritoneum: Secondary | ICD-10-CM | POA: Diagnosis not present

## 2017-11-27 DIAGNOSIS — I700 Atherosclerosis of aorta: Secondary | ICD-10-CM

## 2017-11-27 LAB — CMP (CANCER CENTER ONLY)
ALBUMIN: 3.8 g/dL (ref 3.5–5.0)
ALK PHOS: 62 U/L (ref 38–126)
ALT: 19 U/L (ref 0–44)
AST: 22 U/L (ref 15–41)
Anion gap: 5 (ref 5–15)
BILIRUBIN TOTAL: 0.5 mg/dL (ref 0.3–1.2)
BUN: 7 mg/dL (ref 6–20)
CALCIUM: 9.2 mg/dL (ref 8.9–10.3)
CO2: 26 mmol/L (ref 22–32)
CREATININE: 0.78 mg/dL (ref 0.61–1.24)
Chloride: 104 mmol/L (ref 98–111)
GFR, Est AFR Am: >60 mL/min (ref >60)
GFR, Estimated: >60 mL/min (ref >60)
GLUCOSE: 88 mg/dL (ref 70–99)
Potassium: 3.8 mmol/L (ref 3.5–5.1)
SODIUM: 135 mmol/L (ref 135–145)
Total Protein: 6.9 g/dL (ref 6.5–8.1)

## 2017-11-27 LAB — CBC WITH DIFFERENTIAL (CANCER CENTER ONLY)
Basophils Absolute: 0 10*3/uL (ref 0.0–0.1)
Basophils Relative: 0 %
Eosinophils Absolute: 0.1 10*3/uL (ref 0.0–0.5)
Eosinophils Relative: 1 %
HEMATOCRIT: 36.7 % — AB (ref 38.4–49.9)
HEMOGLOBIN: 12.3 g/dL — AB (ref 13.0–17.1)
LYMPHS ABS: 2 10*3/uL (ref 0.9–3.3)
Lymphocytes Relative: 33 %
MCH: 29.1 pg (ref 27.2–33.4)
MCHC: 33.5 g/dL (ref 32.0–36.0)
MCV: 87 fL (ref 79.3–98.0)
MONO ABS: 1.2 10*3/uL — AB (ref 0.1–0.9)
MONOS PCT: 19 %
NEUTROS ABS: 2.9 10*3/uL (ref 1.5–6.5)
NEUTROS PCT: 47 %
Platelet Count: 182 10*3/uL (ref 140–400)
RBC: 4.22 MIL/uL (ref 4.20–5.82)
RDW: 16.6 % — AB (ref 11.0–14.6)
WBC Count: 6.2 10*3/uL (ref 4.0–10.3)

## 2017-11-27 LAB — IRON AND TIBC
Iron: 66 ug/dL (ref 42–163)
SATURATION RATIOS: 25 % — AB (ref 42–163)
TIBC: 260 ug/dL (ref 202–409)
UIBC: 194 ug/dL

## 2017-11-27 LAB — FERRITIN: Ferritin: 267 ng/mL (ref 24–336)

## 2017-11-27 LAB — CEA (IN HOUSE-CHCC): CEA (CHCC-In House): 7.56 ng/mL — ABNORMAL HIGH (ref 0.00–5.00)

## 2017-11-27 MED ORDER — DEXAMETHASONE SODIUM PHOSPHATE 10 MG/ML IJ SOLN
INTRAMUSCULAR | Status: AC
  Filled 2017-11-27: qty 1

## 2017-11-27 MED ORDER — DEXTROSE 5 % IV SOLN
400.0000 mg/m2 | Freq: Once | INTRAVENOUS | Status: AC
  Administered 2017-11-27: 780 mg via INTRAVENOUS
  Filled 2017-11-27: qty 39

## 2017-11-27 MED ORDER — SODIUM CHLORIDE 0.9% FLUSH
10.0000 mL | INTRAVENOUS | Status: DC | PRN
Start: 2017-11-27 — End: 2017-11-27
  Filled 2017-11-27: qty 10

## 2017-11-27 MED ORDER — PALONOSETRON HCL INJECTION 0.25 MG/5ML
0.2500 mg | Freq: Once | INTRAVENOUS | Status: AC
  Administered 2017-11-27: 0.25 mg via INTRAVENOUS

## 2017-11-27 MED ORDER — PALONOSETRON HCL INJECTION 0.25 MG/5ML
INTRAVENOUS | Status: AC
  Filled 2017-11-27: qty 5

## 2017-11-27 MED ORDER — PANTOPRAZOLE SODIUM 40 MG PO TBEC
40.0000 mg | DELAYED_RELEASE_TABLET | Freq: Two times a day (BID) | ORAL | 2 refills | Status: DC
Start: 2017-11-27 — End: 2018-05-26

## 2017-11-27 MED ORDER — HEPARIN SOD (PORK) LOCK FLUSH 100 UNIT/ML IV SOLN
500.0000 [IU] | Freq: Once | INTRAVENOUS | Status: DC | PRN
  Filled 2017-11-27: qty 5

## 2017-11-27 MED ORDER — FLUOROURACIL CHEMO INJECTION 5 GM/100ML
2400.0000 mg/m2 | INTRAVENOUS | Status: DC
  Administered 2017-11-27: 4700 mg via INTRAVENOUS
  Filled 2017-11-27: qty 94

## 2017-11-27 MED ORDER — OXALIPLATIN CHEMO INJECTION 100 MG/20ML
85.0000 mg/m2 | Freq: Once | INTRAVENOUS | Status: AC
  Administered 2017-11-27: 165 mg via INTRAVENOUS
  Filled 2017-11-27: qty 33

## 2017-11-27 MED ORDER — FLUOROURACIL CHEMO INJECTION 2.5 GM/50ML
400.0000 mg/m2 | Freq: Once | INTRAVENOUS | Status: AC
  Administered 2017-11-27: 800 mg via INTRAVENOUS
  Filled 2017-11-27: qty 16

## 2017-11-27 MED ORDER — DEXAMETHASONE SODIUM PHOSPHATE 10 MG/ML IJ SOLN
10.0000 mg | Freq: Once | INTRAMUSCULAR | Status: AC
  Administered 2017-11-27: 10 mg via INTRAVENOUS

## 2017-11-27 MED ORDER — DEXTROSE 5 % IV SOLN
Freq: Once | INTRAVENOUS | Status: AC
  Administered 2017-11-27: 12:00:00 via INTRAVENOUS

## 2017-11-27 NOTE — Patient Instructions (Signed)
[facility] Discharge Instructions for Patients Receiving Chemotherapy  Today you received the following chemotherapy agents Oxaliplatin, Leucovorin, Adrucil  To help prevent nausea and vomiting after your treatment, we encourage you to take your nausea medication as directed   If you develop nausea and vomiting that is not controlled by your nausea medication, call the clinic.   BELOW ARE SYMPTOMS THAT SHOULD BE REPORTED IMMEDIATELY:  *FEVER GREATER THAN 100.5 F  *CHILLS WITH OR WITHOUT FEVER  NAUSEA AND VOMITING THAT IS NOT CONTROLLED WITH YOUR NAUSEA MEDICATION  *UNUSUAL SHORTNESS OF BREATH  *UNUSUAL BRUISING OR BLEEDING  TENDERNESS IN MOUTH AND THROAT WITH OR WITHOUT PRESENCE OF ULCERS  *URINARY PROBLEMS  *BOWEL PROBLEMS  UNUSUAL RASH Items with * indicate a potential emergency and should be followed up as soon as possible.  Feel free to call the clinic should you have any questions or concerns. The clinic phone number is [phone].  Please show the CHEMO ALERT CARD at check-in to the Emergency Department and triage nurse.   

## 2017-11-29 ENCOUNTER — Inpatient Hospital Stay: Payer: BLUE CROSS/BLUE SHIELD

## 2017-11-29 VITALS — BP 128/72 | HR 72 | Temp 98.2°F | Resp 17

## 2017-11-29 DIAGNOSIS — C161 Malignant neoplasm of fundus of stomach: Secondary | ICD-10-CM | POA: Diagnosis not present

## 2017-11-29 DIAGNOSIS — D500 Iron deficiency anemia secondary to blood loss (chronic): Secondary | ICD-10-CM

## 2017-11-29 MED ORDER — SODIUM CHLORIDE 0.9% FLUSH
10.0000 mL | INTRAVENOUS | Status: DC | PRN
Start: 2017-11-29 — End: 2017-11-29
  Administered 2017-11-29: 10 mL via INTRAVENOUS
  Filled 2017-11-29: qty 10

## 2017-11-29 MED ORDER — HEPARIN SOD (PORK) LOCK FLUSH 100 UNIT/ML IV SOLN
500.0000 [IU] | Freq: Once | INTRAVENOUS | Status: AC | PRN
  Administered 2017-11-29: 500 [IU]
  Filled 2017-11-29: qty 5

## 2017-12-04 ENCOUNTER — Other Ambulatory Visit: Payer: Self-pay

## 2017-12-11 ENCOUNTER — Inpatient Hospital Stay: Payer: BLUE CROSS/BLUE SHIELD

## 2017-12-11 ENCOUNTER — Inpatient Hospital Stay (HOSPITAL_BASED_OUTPATIENT_CLINIC_OR_DEPARTMENT_OTHER): Payer: BLUE CROSS/BLUE SHIELD | Admitting: Nurse Practitioner

## 2017-12-11 ENCOUNTER — Encounter: Payer: Self-pay | Admitting: Nurse Practitioner

## 2017-12-11 ENCOUNTER — Telehealth: Payer: Self-pay

## 2017-12-11 VITALS — BP 100/75 | HR 96 | Temp 97.7°F | Resp 18 | Ht 71.5 in | Wt 167.3 lb

## 2017-12-11 DIAGNOSIS — E538 Deficiency of other specified B group vitamins: Secondary | ICD-10-CM

## 2017-12-11 DIAGNOSIS — R630 Anorexia: Secondary | ICD-10-CM

## 2017-12-11 DIAGNOSIS — I700 Atherosclerosis of aorta: Secondary | ICD-10-CM

## 2017-12-11 DIAGNOSIS — C161 Malignant neoplasm of fundus of stomach: Secondary | ICD-10-CM | POA: Diagnosis not present

## 2017-12-11 DIAGNOSIS — T451X5S Adverse effect of antineoplastic and immunosuppressive drugs, sequela: Secondary | ICD-10-CM

## 2017-12-11 DIAGNOSIS — K921 Melena: Secondary | ICD-10-CM

## 2017-12-11 DIAGNOSIS — K4090 Unilateral inguinal hernia, without obstruction or gangrene, not specified as recurrent: Secondary | ICD-10-CM

## 2017-12-11 DIAGNOSIS — D509 Iron deficiency anemia, unspecified: Secondary | ICD-10-CM | POA: Diagnosis not present

## 2017-12-11 DIAGNOSIS — N400 Enlarged prostate without lower urinary tract symptoms: Secondary | ICD-10-CM

## 2017-12-11 DIAGNOSIS — C786 Secondary malignant neoplasm of retroperitoneum and peritoneum: Secondary | ICD-10-CM

## 2017-12-11 DIAGNOSIS — G620 Drug-induced polyneuropathy: Secondary | ICD-10-CM

## 2017-12-11 DIAGNOSIS — E46 Unspecified protein-calorie malnutrition: Secondary | ICD-10-CM

## 2017-12-11 DIAGNOSIS — N281 Cyst of kidney, acquired: Secondary | ICD-10-CM

## 2017-12-11 DIAGNOSIS — R634 Abnormal weight loss: Secondary | ICD-10-CM

## 2017-12-11 DIAGNOSIS — C169 Malignant neoplasm of stomach, unspecified: Secondary | ICD-10-CM

## 2017-12-11 DIAGNOSIS — K769 Liver disease, unspecified: Secondary | ICD-10-CM

## 2017-12-11 DIAGNOSIS — F1290 Cannabis use, unspecified, uncomplicated: Secondary | ICD-10-CM

## 2017-12-11 DIAGNOSIS — K922 Gastrointestinal hemorrhage, unspecified: Secondary | ICD-10-CM

## 2017-12-11 DIAGNOSIS — Z86718 Personal history of other venous thrombosis and embolism: Secondary | ICD-10-CM

## 2017-12-11 DIAGNOSIS — Z95828 Presence of other vascular implants and grafts: Secondary | ICD-10-CM

## 2017-12-11 DIAGNOSIS — Z79899 Other long term (current) drug therapy: Secondary | ICD-10-CM

## 2017-12-11 LAB — CMP (CANCER CENTER ONLY)
ALK PHOS: 58 U/L (ref 38–126)
ALT: 14 U/L (ref 0–44)
AST: 20 U/L (ref 15–41)
Albumin: 3.8 g/dL (ref 3.5–5.0)
Anion gap: 6 (ref 5–15)
BUN: 9 mg/dL (ref 6–20)
CALCIUM: 9.3 mg/dL (ref 8.9–10.3)
CO2: 25 mmol/L (ref 22–32)
CREATININE: 0.88 mg/dL (ref 0.61–1.24)
Chloride: 105 mmol/L (ref 98–111)
GFR, Est AFR Am: >60 mL/min (ref >60)
GFR, Estimated: >60 mL/min (ref >60)
Glucose, Bld: 115 mg/dL — ABNORMAL HIGH (ref 70–99)
Potassium: 3.9 mmol/L (ref 3.5–5.1)
SODIUM: 136 mmol/L (ref 135–145)
Total Bilirubin: 0.4 mg/dL (ref 0.3–1.2)
Total Protein: 7.1 g/dL (ref 6.5–8.1)

## 2017-12-11 LAB — CBC WITH DIFFERENTIAL (CANCER CENTER ONLY)
BASOS PCT: 1 %
Basophils Absolute: 0 10*3/uL (ref 0.0–0.1)
EOS ABS: 0.1 10*3/uL (ref 0.0–0.5)
EOS PCT: 2 %
HCT: 38.1 % — ABNORMAL LOW (ref 38.4–49.9)
HEMOGLOBIN: 12.6 g/dL — AB (ref 13.0–17.1)
Lymphocytes Relative: 39 %
Lymphs Abs: 2 10*3/uL (ref 0.9–3.3)
MCH: 29.4 pg (ref 27.2–33.4)
MCHC: 33 g/dL (ref 32.0–36.0)
MCV: 89.2 fL (ref 79.3–98.0)
MONO ABS: 0.9 10*3/uL (ref 0.1–0.9)
MONOS PCT: 17 %
Neutro Abs: 2.1 10*3/uL (ref 1.5–6.5)
Neutrophils Relative %: 41 %
PLATELETS: 191 10*3/uL (ref 140–400)
RBC: 4.27 MIL/uL (ref 4.20–5.82)
RDW: 17.8 % — AB (ref 11.0–14.6)
WBC Count: 5.1 10*3/uL (ref 4.0–10.3)

## 2017-12-11 MED ORDER — SODIUM CHLORIDE 0.9% FLUSH
10.0000 mL | INTRAVENOUS | Status: DC | PRN
  Administered 2017-12-11: 10 mL via INTRAVENOUS
  Filled 2017-12-11: qty 10

## 2017-12-11 MED ORDER — FLUOROURACIL CHEMO INJECTION 2.5 GM/50ML
400.0000 mg/m2 | Freq: Once | INTRAVENOUS | Status: AC
  Administered 2017-12-11: 800 mg via INTRAVENOUS
  Filled 2017-12-11: qty 16

## 2017-12-11 MED ORDER — PALONOSETRON HCL INJECTION 0.25 MG/5ML
0.2500 mg | Freq: Once | INTRAVENOUS | Status: AC
  Administered 2017-12-11: 0.25 mg via INTRAVENOUS

## 2017-12-11 MED ORDER — PALONOSETRON HCL INJECTION 0.25 MG/5ML
INTRAVENOUS | Status: AC
  Filled 2017-12-11: qty 5

## 2017-12-11 MED ORDER — DEXAMETHASONE SODIUM PHOSPHATE 10 MG/ML IJ SOLN
10.0000 mg | Freq: Once | INTRAMUSCULAR | Status: AC
  Administered 2017-12-11: 10 mg via INTRAVENOUS

## 2017-12-11 MED ORDER — SODIUM CHLORIDE 0.9 % IV SOLN
2400.0000 mg/m2 | INTRAVENOUS | Status: DC
  Administered 2017-12-11: 4700 mg via INTRAVENOUS
  Filled 2017-12-11: qty 94

## 2017-12-11 MED ORDER — DEXTROSE 5 % IV SOLN
Freq: Once | INTRAVENOUS | Status: AC
  Administered 2017-12-11: 10:00:00 via INTRAVENOUS
  Filled 2017-12-11: qty 250

## 2017-12-11 MED ORDER — OXALIPLATIN CHEMO INJECTION 100 MG/20ML
85.0000 mg/m2 | Freq: Once | INTRAVENOUS | Status: AC
  Administered 2017-12-11: 165 mg via INTRAVENOUS
  Filled 2017-12-11: qty 33

## 2017-12-11 MED ORDER — MIRTAZAPINE 7.5 MG PO TABS: 7.5000 mg | ORAL_TABLET | Freq: Every day | ORAL | 1 refills | Status: DC

## 2017-12-11 MED ORDER — DEXAMETHASONE SODIUM PHOSPHATE 10 MG/ML IJ SOLN
INTRAMUSCULAR | Status: AC
  Filled 2017-12-11: qty 1

## 2017-12-11 MED ORDER — LEUCOVORIN CALCIUM INJECTION 350 MG
400.0000 mg/m2 | Freq: Once | INTRAVENOUS | Status: AC
  Administered 2017-12-11: 780 mg via INTRAVENOUS
  Filled 2017-12-11: qty 39

## 2017-12-11 MED ORDER — DEXTROSE 5 % IV SOLN
INTRAVENOUS | Status: DC
  Administered 2017-12-11: 10:00:00 via INTRAVENOUS
  Filled 2017-12-11: qty 250

## 2017-12-11 NOTE — Patient Instructions (Signed)
[facility] Discharge Instructions for Patients Receiving Chemotherapy  Today you received the following chemotherapy agents Oxaliplatin, Leucovorin and Adrucil   To help prevent nausea and vomiting after your treatment, we encourage you to take your nausea medication as directed.    If you develop nausea and vomiting that is not controlled by your nausea medication, call the clinic.   BELOW ARE SYMPTOMS THAT SHOULD BE REPORTED IMMEDIATELY:  *FEVER GREATER THAN 100.5 F  *CHILLS WITH OR WITHOUT FEVER  NAUSEA AND VOMITING THAT IS NOT CONTROLLED WITH YOUR NAUSEA MEDICATION  *UNUSUAL SHORTNESS OF BREATH  *UNUSUAL BRUISING OR BLEEDING  TENDERNESS IN MOUTH AND THROAT WITH OR WITHOUT PRESENCE OF ULCERS  *URINARY PROBLEMS  *BOWEL PROBLEMS  UNUSUAL RASH Items with * indicate a potential emergency and should be followed up as soon as possible.  Feel free to call the clinic should you have any questions or concerns. The clinic phone number is [phone].  Please show the CHEMO ALERT CARD at check-in to the Emergency Department and triage nurse.   

## 2017-12-11 NOTE — Progress Notes (Signed)
[facility] Collins  [phone] [phone]  Clinic Follow up Note   Patient Care Team: [name], FNP as PCP - General (Family Medicine) 12/11/2017  SUMMARY OF ONCOLOGIC HISTORY: Oncology History   Cancer Staging Gastric cancer [facility]) Staging form: Stomach, AJCC 8th Edition - Clinical stage from 06/04/2017: Stage IVB (cT4, cN0, cM1) - Signed by [name], MD on 06/10/2017       Gastric cancer ([place])   06/03/2017 - 06/06/2017 Hospital Admission    Admitted to the hospital on 06/03/17 with complaints of fatigue and melena. During his stay he underwent an endoscopy that revealed a malignant gastric tumor in the cardia and in the gastric fundus. Biopsy results revealed adenocarcinoma. Pt was discharged on 06/06/17.      06/04/2017 Initial Biopsy    Diagnosis 06/04/17 Stomach, biopsy, Fundus - ADENOCARCINOMA - SEE COMMENT Microscopic Comment The stomach biopsy is of an adenocarcinoma arising in a background of intestinal metaplasia. A Warthin-Starry stain is performed to determine the possibility of the presence of Helicobacter pylori. The Warthin-Starry stain is negative for organisms morphologically consistent with Helicobacter pylori.      06/04/2017 Procedure    Esophagogastroduodenoscopy 06/04/17 by Dr. Benson Norway Impression: Normal esophagus. Malignant gastric tumor in the cardia and in the  gastric fundus. Biopsied. Normal examined duodenum.      06/04/2017 Miscellaneous    HER2 (-) EBV (-) MSI-S PD-L1 CPS 5%      06/05/2017 Imaging    CT CAP W Contrast 06/05/17 IMPRESSION: 9.1 cm fungating mass in the gastric cardia/posterior gastric fundus, corresponding to the patient's newly diagnosed gastric Cancer. Associated extra gastric extension with peritoneal disease in the left upper abdomen, including a dominant 4.2 cm peritoneal implant. Small volume pelvic ascites. 1.8 cm hypoenhancing lesion along the inferior spleen, indeterminate.      06/10/2017 Initial Diagnosis    Gastric cancer ([place])      06/26/2017 -  Chemotherapy    First line chemo FOLFOX every 2 weeks        08/29/2017 Imaging    IMPRESSION: 1. Interval decrease in size of fungating ulcerative mass arising from the posterior gastric fundus. 2. Left peritoneal implant, compatible with peritoneal carcinomatosis, has decreased in size from the previous exam. 3. Right lobe of liver lesion present on previous exam is less conspicuous on today's study. A new lesion is identified within the left lobe of liver, suspicious for metastasis. Additional focus of low attenuation within segment 5 adjacent to the gallbladder fossa may represent focal fatty deposition. 4. Similar appearance of mildly complex cyst containing mural calcification and possible internal septation arising from the inferior pole of right kidney. More definitive characterization of this lesion with renal protocol MRI may be helpful. 5.  Mild prostate gland enlargement. 6.  Aortic Atherosclerosis (ICD10-I70.0).      11/25/2017 Imaging    11/25/2017 CT AP W Contrast IMPRESSION: 1. Mixed appearance, with the gastric fundal mass thicker than it was on 08/29/2017 (although still improved compared to 06/05/2017), but with reduced local conglomerate adenopathy along the splenic hilum compared to 08/29/2017. 2. At this time I do not see any definite hepatic metastatic lesions. Several lesions of suspicion in the left hepatic lobe and inferiorly in the right hepatic lobe are not readily visible today. There is a small focus of hypodensity adjacent to the gallbladder fossa in segment 7 which is technically nonspecific but which could reflect focal fatty infiltration. 3. Increase in nodular contour of scarring in the  left lower lobe. This is probably incidental but I cannot completely exclude the possibility of a localized metastatic lesion. Surveillance of the left lower lobe suggested. 4. Bosniak category 60F  cyst of the right kidney lower pole with internal septation and calcification. Surveillance of this lesion is suggested; I suspect that the patient's cancer surveillance will supersede the typical recommended surveillance schedule for complex cyst. 5. Other imaging findings of potential clinical significance: Aortic Atherosclerosis (ICD10-I70.0). Degenerative glenohumeral arthropathy bilaterally. Mild impingement at L4-5 and L5-S1.     CURRENT THERAPY: first line chemo FOLFOX every 2 weeks, started on 06/26/2017  INTERVAL HISTORY: Ryan Collins returns for follow-up and next cycle FOLFOX as scheduled.  He completed cycle 12 on 11/27/2017.  His appetite is decreasing, he has lost some more weight.  He still is able to perform all ADLs and activities without much difficulty.  He notes intermittent tingling to fingertips, does not resolve after cold sensitivity dissipates.  Denies functional limitations. Denies mucositis, nausea, vomiting, constipation, diarrhea, or blood in stool.  Denies abdominal pain.  No recent fever, chills, cough, chest pain, or dyspnea.  REVIEW OF SYSTEMS:   Constitutional: Denies fevers, chills (+)  weight loss (+) decreased appetite Ears, nose, mouth, throat, and face: Denies mucositis or sore throat Respiratory: Denies cough, dyspnea or wheezes Cardiovascular: Denies palpitation, chest discomfort or lower extremity swelling Gastrointestinal:  Denies nausea, vomiting, constipation, diarrhea, hematochezia, abdominal pain, heartburn or change in bowel habits Skin: Denies abnormal skin rashes Lymphatics: Denies new lymphadenopathy or easy bruising Neurological:Denies numbness or new weaknesses (+) mild intermittent tingling to fingertips Behavioral/Psych: Mood is stable, no new changes  All other systems were reviewed with the patient and are negative.  MEDICAL HISTORY:  Past Medical History:  Diagnosis Date  . DVT (deep venous thrombosis) ([place]) 2011  . GI bleeding 2001   . History of DVT of lower extremity   . Melena 05/2017  . Stomach ulcer    Clinically suspected; No EGD confirmation as of 06/03/17    SURGICAL HISTORY: Past Surgical History:  Procedure Laterality Date  . ESOPHAGOGASTRODUODENOSCOPY  06/04/2017  . ESOPHAGOGASTRODUODENOSCOPY N/A 06/04/2017   Procedure: ESOPHAGOGASTRODUODENOSCOPY (EGD);  Surgeon: Carol Ada, MD;  Location: [facility];  Service: Endoscopy;  Laterality: N/A;  . IR FLUORO GUIDE PORT INSERTION RIGHT  06/20/2017  . IR US GUIDE VASC ACCESS RIGHT  06/20/2017    I have reviewed the social history and family history with the patient and they are unchanged from previous note.  ALLERGIES:  has No Known Allergies.  MEDICATIONS:  Current Outpatient Medications  Medication Sig Dispense Refill  . acetaminophen (TYLENOL) 500 MG tablet Take 1,000 mg by mouth every 6 (six) hours as needed for mild pain.     Marland Kitchen amLODipine (NORVASC) 5 MG tablet Take 1 tablet (5 mg total) by mouth daily. 30 tablet 5  . Blood Pressure Monitoring (BLOOD PRESSURE MONITOR/M CUFF) MISC 1 each by Does not apply route daily. 1 each 0  . lidocaine-prilocaine (EMLA) cream Apply 1 application topically as needed (for port).    . ondansetron (ZOFRAN) 8 MG tablet Take 8 mg by mouth 2 (two) times daily as needed for refractory nausea / vomiting. Start on day 3 after chemo    . pantoprazole (PROTONIX) 40 MG tablet Take 1 tablet (40 mg total) by mouth 2 (two) times daily. 60 tablet 2  . prochlorperazine (COMPAZINE) 10 MG tablet Take 1 tablet (10 mg total) by mouth every 6 (six) hours as needed for  nausea or vomiting. 30 tablet 3  . thiamine 100 MG tablet Take 1 tablet (100 mg total) by mouth daily. 30 tablet 3  . vitamin B-12 (CYANOCOBALAMIN) 1000 MCG tablet Take 1 tablet (1,000 mcg total) by mouth daily. 30 tablet 0  . mirtazapine (REMERON) 7.5 MG tablet Take 1 tablet (7.5 mg total) by mouth at bedtime. 30 tablet 1   No current facility-administered medications for  this visit.     PHYSICAL EXAMINATION: ECOG PERFORMANCE STATUS: 1 - Symptomatic but completely ambulatory  Vitals:   12/11/17 0858  BP: 100/75  Pulse: 96  Resp: 18  Temp: 97.7 F (36.5 C)  SpO2: 97%   Filed Weights   12/11/17 0858  Weight: 167 lb 4.8 oz (75.9 kg)    GENERAL:alert, no distress and comfortable SKIN: no rashes or significant lesions EYES: sclera clear OROPHARYNX:no thrush or ulcers LYMPH:  no palpable cervical or supraclavicular lymphadenopathy LUNGS: clear to auscultation with normal breathing effort HEART: regular rate & rhythm, no lower extremity edema ABDOMEN:abdomen soft, non-tender and normal bowel sounds Musculoskeletal:no cyanosis of digits and no clubbing  NEURO: alert & oriented x 3 with fluent speech, no focal motor/sensory deficits PAC without erythema  LABORATORY DATA:  I have reviewed the data as listed CBC Latest Ref Rng & Units 12/11/2017 11/27/2017 11/13/2017  WBC 4.0 - 10.3 K/uL 5.1 6.2 5.6  Hemoglobin 13.0 - 17.1 g/dL 12.6(L) 12.3(L) 11.9(L)  Hematocrit 38.4 - 49.9 % 38.1(L) 36.7(L) 36.7(L)  Platelets 140 - 400 K/uL 191 182 181     CMP Latest Ref Rng & Units 12/11/2017 11/27/2017 11/13/2017  Glucose 70 - 99 mg/dL 115(H) 88 95  BUN 6 - 20 mg/dL 9 7 12   Creatinine 0.61 - 1.24 mg/dL 0.88 0.78 1.08  Sodium 135 - 145 mmol/L 136 135 137  Potassium 3.5 - 5.1 mmol/L 3.9 3.8 4.0  Chloride 98 - 111 mmol/L 105 104 106  CO2 22 - 32 mmol/L 25 26 27   Calcium 8.9 - 10.3 mg/dL 9.3 9.2 9.2  Total Protein 6.5 - 8.1 g/dL 7.1 6.9 6.8  Total Bilirubin 0.3 - 1.2 mg/dL 0.4 0.5 0.3  Alkaline Phos 38 - 126 U/L 58 62 62  AST 15 - 41 U/L 20 22 24   ALT 0 - 44 U/L 14 19 22    CEA 06/13/17: 53.01 07/10/17: 106.44 08/07/17: 31.11 09/04/17: 11.02 10/02/17: 5.64 10/30/17: 4.31 11/27/2017: 7.56   RADIOGRAPHIC STUDIES: I have personally reviewed the radiological images as listed and agreed with the findings in the report. No results found.   ASSESSMENT & PLAN:  Ryan Collins 57 y.o. male, without significant past medical history, but also does not  see doctors routinely, presented with worsening fatigue, intermittent epigastric pain and melena for 3-4 months, and 50 pounds weight loss over the past 6 months.  1.  Gastric cancer, T4bNxM1 with peritoneal metastasis, adenocarcinoma, HER2(-), MSI-stable, PD-L1positive (5%) 2. Anemia of iron deficiency and tumor bleeding, B12 deficiency   3. History of DVT, ? Protein C deficiency  4. History of alcohol and marijuana use  5. Weight loss and malnutrition 6. Goal of care discussion  7. Right inguinal hernia  8.  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 He has completed 12 cycles of FOLFOX chemotherapy, he is tolerating treatment very well overall. He has decreased appetite and gradual weight loss. I encouraged him to drink nutrition supplement such as boost or ensure. I discussed appetite stimulant such as mirtazapine,  megace, and marinol. He has previous marijuana use, will avoid marinol. Due to history of DVT, I will avoid megace. After review of potential side effects and benefit, he agrees to try mirtazapine. I prescribed today. For early signs of peripheral neuropathy, I suggest he start oral B complex vitamin once daily for nerve health.   Dr. Burr Medico previously discussed his CT scan in GI tumor board. The coscensus is to obtain MRI to further evaluate the liver lesion. If negative, will then refer to Dr. Barry Dienes, GI surgeon. The patient agrees. I ordered MRI today, to be done in 1-2 weeks before next visit. Labs reviewed, CBC and CMP adequate to proceed with next cycle. Recent iron studies are adequate, he is not on oral iron therapy. CEA slightly increased on 7/17, will check next visit. F/u in 2 weeks with next cycle.   PLAN: -Labs reviewed, proceed with cycle 13 FOLFOX -MRI abd w/wo contrast in 1-2 weeks, before next visit - ordered today -Add  nutrition supplement and begin mirtazapine for decreased appetite and weight loss, prescribed today -Start B complex vitamin once daily for nerve health  -F/u in 2 weeks with next cycle and to review MRI   Orders Placed This Encounter  Procedures  . MR Abdomen W Wo Contrast    Standing Status:   Future    Standing Expiration Date:   02/11/2019    Order Specific Question:   ** REASON FOR EXAM (FREE TEXT)    Answer:   please evaluate liver lesion, r/o metastatic disease    Order Specific Question:   If indicated for the ordered procedure, I authorize the administration of contrast media per Radiology protocol    Answer:   Yes    Order Specific Question:   What is the patient's sedation requirement?    Answer:   No Sedation    Order Specific Question:   Does the patient have a pacemaker or implanted devices?    Answer:   No    Order Specific Question:   Radiology Contrast Protocol - do NOT remove file path    Answer:   [email or web address]    Order Specific Question:   Preferred imaging location?    Answer:   [facility] (table limit-350 lbs)   All questions were answered. The patient knows to call the clinic with any problems, questions or concerns. No barriers to learning was detected. I spent 20 minutes counseling the patient face to face. The total time spent in the appointment was 25 minutes and more than 50% was on counseling and review of test results     Alla Feeling, NP 12/11/17

## 2017-12-11 NOTE — Telephone Encounter (Signed)
Printed avs and calender of upcoming appointment. Per 7/31 los 

## 2017-12-13 ENCOUNTER — Inpatient Hospital Stay: Payer: BLUE CROSS/BLUE SHIELD | Attending: Hematology

## 2017-12-13 VITALS — BP 128/70 | HR 78 | Temp 98.2°F | Resp 17

## 2017-12-13 DIAGNOSIS — F1210 Cannabis abuse, uncomplicated: Secondary | ICD-10-CM | POA: Insufficient documentation

## 2017-12-13 DIAGNOSIS — D6859 Other primary thrombophilia: Secondary | ICD-10-CM | POA: Diagnosis not present

## 2017-12-13 DIAGNOSIS — C161 Malignant neoplasm of fundus of stomach: Secondary | ICD-10-CM | POA: Diagnosis present

## 2017-12-13 DIAGNOSIS — E46 Unspecified protein-calorie malnutrition: Secondary | ICD-10-CM | POA: Insufficient documentation

## 2017-12-13 DIAGNOSIS — R109 Unspecified abdominal pain: Secondary | ICD-10-CM | POA: Insufficient documentation

## 2017-12-13 DIAGNOSIS — D500 Iron deficiency anemia secondary to blood loss (chronic): Secondary | ICD-10-CM | POA: Insufficient documentation

## 2017-12-13 DIAGNOSIS — C786 Secondary malignant neoplasm of retroperitoneum and peritoneum: Secondary | ICD-10-CM | POA: Insufficient documentation

## 2017-12-13 DIAGNOSIS — G620 Drug-induced polyneuropathy: Secondary | ICD-10-CM | POA: Insufficient documentation

## 2017-12-13 DIAGNOSIS — K921 Melena: Secondary | ICD-10-CM | POA: Diagnosis not present

## 2017-12-13 DIAGNOSIS — K4090 Unilateral inguinal hernia, without obstruction or gangrene, not specified as recurrent: Secondary | ICD-10-CM | POA: Diagnosis not present

## 2017-12-13 DIAGNOSIS — Z86718 Personal history of other venous thrombosis and embolism: Secondary | ICD-10-CM | POA: Diagnosis not present

## 2017-12-13 DIAGNOSIS — Z5111 Encounter for antineoplastic chemotherapy: Secondary | ICD-10-CM | POA: Diagnosis not present

## 2017-12-13 DIAGNOSIS — F1011 Alcohol abuse, in remission: Secondary | ICD-10-CM | POA: Insufficient documentation

## 2017-12-13 DIAGNOSIS — Z9221 Personal history of antineoplastic chemotherapy: Secondary | ICD-10-CM | POA: Diagnosis not present

## 2017-12-13 MED ORDER — HEPARIN SOD (PORK) LOCK FLUSH 100 UNIT/ML IV SOLN
500.0000 [IU] | Freq: Once | INTRAVENOUS | Status: AC | PRN
  Administered 2017-12-13: 500 [IU]
  Filled 2017-12-13: qty 5

## 2017-12-13 MED ORDER — SODIUM CHLORIDE 0.9% FLUSH
10.0000 mL | INTRAVENOUS | Status: DC | PRN
  Administered 2017-12-13: 10 mL
  Filled 2017-12-13: qty 10

## 2017-12-18 ENCOUNTER — Ambulatory Visit (HOSPITAL_COMMUNITY): Admission: RE | Admit: 2017-12-18 | Payer: BLUE CROSS/BLUE SHIELD | Source: Ambulatory Visit

## 2017-12-20 NOTE — Progress Notes (Signed)
[place]  [phone]) [phone] [phone]  Clinic Follow Up Note   Patient Care Team: Lanae Boast, FNP as PCP - General (Family Medicine) 12/26/2017  CHIEF COMPLAINTS:  Follow up gastric Cancer  Oncology History   Cancer Staging Gastric cancer [facility]) Staging form: Stomach, AJCC 8th Edition - Clinical stage from 06/04/2017: Stage IVB (cT4, cN0, cM1) - Signed by [name], MD on 06/10/2017       Gastric cancer ([place])   06/03/2017 - 06/06/2017 Hospital Admission    Admitted to the hospital on 06/03/17 with complaints of fatigue and melena. During his stay he underwent an endoscopy that revealed a malignant gastric tumor in the cardia and in the gastric fundus. Biopsy results revealed adenocarcinoma. Pt was discharged on 06/06/17.    06/04/2017 Initial Biopsy    Diagnosis 06/04/17 Stomach, biopsy, Fundus - ADENOCARCINOMA - SEE COMMENT Microscopic Comment The stomach biopsy is of an adenocarcinoma arising in a background of intestinal metaplasia. A Warthin-Starry stain is performed to determine the possibility of the presence of Helicobacter pylori. The Warthin-Starry stain is negative for organisms morphologically consistent with Helicobacter pylori.    06/04/2017 Procedure    Esophagogastroduodenoscopy 06/04/17 by Dr. Benson Norway Impression: Normal esophagus. Malignant gastric tumor in the cardia and in the  gastric fundus. Biopsied. Normal examined duodenum.    06/04/2017 Miscellaneous    HER2 (-) EBV (-) MSI-S PD-L1 CPS 5%    06/05/2017 Imaging    CT CAP W Contrast 06/05/17 IMPRESSION: 9.1 cm fungating mass in the gastric cardia/posterior gastric fundus, corresponding to the patient's newly diagnosed gastric Cancer. Associated extra gastric extension with peritoneal disease in the left upper abdomen, including a dominant 4.2 cm peritoneal implant. Small volume pelvic ascites. 1.8 cm hypoenhancing lesion along the inferior spleen, indeterminate.    06/10/2017 Initial Diagnosis    Gastric cancer ([place])    06/26/2017 -  Chemotherapy    First line chemo FOLFOX every 2 weeks      08/29/2017 Imaging    IMPRESSION: 1. Interval decrease in size of fungating ulcerative mass arising from the posterior gastric fundus. 2. Left peritoneal implant, compatible with peritoneal carcinomatosis, has decreased in size from the previous exam. 3. Right lobe of liver lesion present on previous exam is less conspicuous on today's study. A new lesion is identified within the left lobe of liver, suspicious for metastasis. Additional focus of low attenuation within segment 5 adjacent to the gallbladder fossa may represent focal fatty deposition. 4. Similar appearance of mildly complex cyst containing mural calcification and possible internal septation arising from the inferior pole of right kidney. More definitive characterization of this lesion with renal protocol MRI may be helpful. 5.  Mild prostate gland enlargement. 6.  Aortic Atherosclerosis (ICD10-I70.0).    11/25/2017 Imaging    11/25/2017 CT AP W Contrast IMPRESSION: 1. Mixed appearance, with the gastric fundal mass thicker than it was on 08/29/2017 (although still improved compared to 06/05/2017), but with reduced local conglomerate adenopathy along the splenic hilum compared to 08/29/2017. 2. At this time I do not see any definite hepatic metastatic lesions. Several lesions of suspicion in the left hepatic lobe and inferiorly in the right hepatic lobe are not readily visible today. There is a small focus of hypodensity adjacent to the gallbladder fossa in segment 7 which is technically nonspecific but which could reflect focal fatty infiltration. 3. Increase in nodular contour of scarring in the left lower lobe. This is probably incidental but I cannot completely exclude the  possibility of a localized metastatic lesion. Surveillance of the left lower lobe suggested. 4. Bosniak category 12F cyst of  the right kidney lower pole with internal septation and calcification. Surveillance of this lesion is suggested; I suspect that the patient's cancer surveillance will supersede the typical recommended surveillance schedule for complex cyst. 5. Other imaging findings of potential clinical significance: Aortic Atherosclerosis (ICD10-I70.0). Degenerative glenohumeral arthropathy bilaterally. Mild impingement at L4-5 and L5-S1.      HISTORY OF PRESENTING ILLNESS:  Ryan Collins 57 y.o. male is a here because of newly diagnosed gastric cancer. The patient was referred by hospitalist after her recent hospital discharge.  The patient presents to the clinic today by himself.   Pt went to urgent care on 05/15/17 with complaints of worsening fatigue, central abdominal pain, black stool for 4 months, and 50 lbs weight loss in the past 6 months. discomfort over the past few months. He described the pain as sharp and dull that is worse after he eats. The pain would last 30-40 minutes after eating. Pt was then seen in the ED and admitted to the hospital on 06/03/17 with complaints of fatigue and melena. During his stay he underwent an endoscopy that revealed a malignant gastric tumor in the cardia and in the gastric fundus. Biopsy of the mass revealed adenocarcinoma.  Patient received blood transfusion and 1 dose of IV Feraheme, tolerated well.  His fatigue has much improved.  Pt was dischared on 06/06/17.    Patient is single, no children, lives with his brother in a trailer, he has multiple brothers and sisters and some of them live in the town.  Her mother also lives in [place].  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  He is concerned about his finances if he is not able to continue working.   CURRENT THERAPY: first line chemo FOLFOX every 2 weeks, started on 06/26/2017  INTERIM HISTORY:  Ryan Collins returns for follow-up. He is here alone. He didn't receive a call from Dr.  Marlowe Aschoff office. He lost his appetite and was fatigued after last chemo cycle. These symptoms lasted for a week. He also complains of numbness and tingling in his fingers and toes for which he takes Vitamin B12. He also reports diarrhea and  "upset stomach" after food, but denies nausea.       MEDICAL HISTORY:  Past Medical History:  Diagnosis Date  . DVT (deep venous thrombosis) ([place]) 2011  . GI bleeding 2001  . History of DVT of lower extremity   . Melena 05/2017  . Stomach ulcer    Clinically suspected; No EGD confirmation as of 06/03/17    SURGICAL HISTORY: Past Surgical History:  Procedure Laterality Date  . ESOPHAGOGASTRODUODENOSCOPY  06/04/2017  . ESOPHAGOGASTRODUODENOSCOPY N/A 06/04/2017   Procedure: ESOPHAGOGASTRODUODENOSCOPY (EGD);  Surgeon: Carol Ada, MD;  Location: [facility];  Service: Endoscopy;  Laterality: N/A;  . IR FLUORO GUIDE PORT INSERTION RIGHT  06/20/2017  . IR US GUIDE VASC ACCESS RIGHT  06/20/2017    SOCIAL HISTORY: Social History   Socioeconomic History  . Marital status: Legally Separated    Spouse name: Not on file  . Number of children: Not on file  . Years of education: Not on file  . Highest education level: Not on file  Occupational History  . Not on file  Social Needs  . Financial resource strain: Not on file  . Food insecurity:    Worry: Not on file    Inability: Not  on file  . Transportation needs:    Medical: Not on file    Non-medical: Not on file  Tobacco Use  . Smoking status: Never Smoker  . Smokeless tobacco: Never Used  Substance and Sexual Activity  . Alcohol use: Yes    Alcohol/week: 5.0 standard drinks    Types: 5 Cans of beer per week    Comment: 05/2017 i QUIT DRINKING 4 MONTHS AGO "  . Drug use: Yes    Types: Marijuana    Comment: occ  . Sexual activity: Not on file  Lifestyle  . Physical activity:    Days per week: Not on file    Minutes per session: Not on file  . Stress: Not on file  Relationships  .  Social connections:    Talks on phone: Not on file    Gets together: Not on file    Attends religious service: Not on file    Active member of club or organization: Not on file    Attends meetings of clubs or organizations: Not on file    Relationship status: Not on file  . Intimate partner violence:    Fear of current or ex partner: Not on file    Emotionally abused: Not on file    Physically abused: Not on file    Forced sexual activity: Not on file  Other Topics Concern  . Not on file  Social History Narrative  . Not on file    FAMILY HISTORY: Family History  Problem Relation Age of Onset  . Hypertension Mother   . Cancer Sister 68       breast    ALLERGIES:  has No Known Allergies.  MEDICATIONS:  Current Outpatient Medications  Medication Sig Dispense Refill  . acetaminophen (TYLENOL) 500 MG tablet Take 1,000 mg by mouth every 6 (six) hours as needed for mild pain.     Marland Kitchen amLODipine (NORVASC) 5 MG tablet Take 1 tablet (5 mg total) by mouth daily. 30 tablet 5  . Blood Pressure Monitoring (BLOOD PRESSURE MONITOR/M CUFF) MISC 1 each by Does not apply route daily. 1 each 0  . lidocaine-prilocaine (EMLA) cream Apply 1 application topically as needed (for port).    . mirtazapine (REMERON) 7.5 MG tablet Take 1 tablet (7.5 mg total) by mouth at bedtime. 30 tablet 1  . ondansetron (ZOFRAN) 8 MG tablet Take 8 mg by mouth 2 (two) times daily as needed for refractory nausea / vomiting. Start on day 3 after chemo    . pantoprazole (PROTONIX) 40 MG tablet Take 1 tablet (40 mg total) by mouth 2 (two) times daily. 60 tablet 2  . prochlorperazine (COMPAZINE) 10 MG tablet Take 1 tablet (10 mg total) by mouth every 6 (six) hours as needed for nausea or vomiting. 30 tablet 3  . thiamine 100 MG tablet Take 1 tablet (100 mg total) by mouth daily. 30 tablet 3  . vitamin B-12 (CYANOCOBALAMIN) 1000 MCG tablet Take 1 tablet (1,000 mcg total) by mouth daily. 30 tablet 0   No current  facility-administered medications for this visit.    Facility-Administered Medications Ordered in Other Visits  Medication Dose Route Frequency Provider Last Rate Last Dose  . dextrose 5 % solution   Intravenous Continuous [name], MD      . fluorouracil (ADRUCIL) 4,700 mg in sodium chloride 0.9 % 56 mL chemo infusion  2,400 mg/m2 (Treatment Plan Recorded) Intravenous 1 day or 1 dose [name], MD      .  fluorouracil (ADRUCIL) chemo injection 800 mg  400 mg/m2 (Treatment Plan Recorded) Intravenous Once [name], MD      . leucovorin 780 mg in dextrose 5 % 250 mL infusion  400 mg/m2 (Treatment Plan Recorded) Intravenous Once [name], MD      . oxaliplatin (ELOXATIN) 135 mg in dextrose 5 % 500 mL chemo infusion  70 mg/m2 (Treatment Plan Recorded) Intravenous Once [name], MD        REVIEW OF SYSTEMS:  Constitutional: Denies fevers, chills or abnormal night sweats, (+) weight loss, improving, (+) fatigue  Eyes: Denies blurriness of vision, double vision or watery eyes Ears, nose, mouth, throat, and face: Denies mucositis or sore throat Respiratory: Denies cough, dyspnea or wheezes Cardiovascular: Denies palpitation, chest discomfort or lower extremity swelling Gastrointestinal:  Denies nausea, heartburn (+) intermittent epigastric pain (+) right inguinal hernia (+) left lower abdominal tingling Skin: Denies abnormal skin rashes Lymphatics: Denies new lymphadenopathy or easy bruising Neurological: Denies weakness (+) numbness and tingling in all extremities  Behavioral/Psych: Mood is stable, no new changes  All other systems were reviewed with the patient and are negative.  PHYSICAL EXAMINATION:  ECOG PERFORMANCE STATUS: 1 - Symptomatic but completely ambulatory   Today's Vitals   12/26/17 0820 12/26/17 0822  BP: 96/69   Pulse: 68   Resp: 18   Temp: 97.7 F (36.5 C)   TempSrc: Oral   SpO2: 100%   Weight: 166 lb 9.6 oz (75.6 kg)   Height: 5' 11.5" (1.816 m)   PainSc:  0-No  pain   GENERAL:alert, no distress and comfortable SKIN: skin color, texture, turgor are normal, no rashes or significant lesions EYES: normal, conjunctiva are pink and non-injected, sclera clear OROPHARYNX:no exudate, no erythema and lips, buccal mucosa, and tongue normal  NECK: supple, thyroid normal size, non-tender, without nodularity LYMPH:  no palpable lymphadenopathy in the cervical, axillary or inguinal LUNGS: clear to auscultation and percussion with normal breathing effort HEART: regular rate & rhythm and no murmurs and no lower extremity edema ABDOMEN:abdomen soft, non-tender and normal bowel sounds (+) right inguinal hernia Musculoskeletal:no cyanosis of digits and no clubbing  PSYCH: alert & oriented x 3 with fluent speech NEURO: no focal motor/sensory deficits  LABORATORY DATA:  I have reviewed the data as listed CBC Latest Ref Rng & Units 12/26/2017 12/11/2017 11/27/2017  WBC 4.0 - 10.3 K/uL 4.1 5.1 6.2  Hemoglobin 13.0 - 17.1 g/dL 11.9(L) 12.6(L) 12.3(L)  Hematocrit 38.4 - 49.9 % 36.2(L) 38.1(L) 36.7(L)  Platelets 140 - 400 K/uL 193 191 182    CMP Latest Ref Rng & Units 12/26/2017 12/11/2017 11/27/2017  Glucose 70 - 99 mg/dL 94 115(H) 88  BUN 6 - 20 mg/dL 8 9 7   Creatinine 0.61 - 1.24 mg/dL 0.79 0.88 0.78  Sodium 135 - 145 mmol/L 138 136 135  Potassium 3.5 - 5.1 mmol/L 4.0 3.9 3.8  Chloride 98 - 111 mmol/L 104 105 104  CO2 22 - 32 mmol/L 25 25 26   Calcium 8.9 - 10.3 mg/dL 9.0 9.3 9.2  Total Protein 6.5 - 8.1 g/dL 6.8 7.1 6.9  Total Bilirubin 0.3 - 1.2 mg/dL 0.6 0.4 0.5  Alkaline Phos 38 - 126 U/L 55 58 62  AST 15 - 41 U/L 24 20 22   ALT 0 - 44 U/L 17 14 19    CEA 06/13/17: 53.01 07/10/17: 106.44 08/07/17: 31.11 09/04/17: 11.02 10/02/17: 5.64 10/30/17: 4.31 11/27/17: 7.56 12/26/17: PENDING  PATHOLOGY  Diagnosis 06/04/17 Stomach, biopsy, Fundus - ADENOCARCINOMA -  SEE COMMENT Microscopic Comment The stomach biopsy is of an adenocarcinoma arising in a background of  intestinal metaplasia. A Warthin-Starry stain is performed to determine the possibility of the presence of Helicobacter pylori. The Warthin-Starry stain is negative for organisms morphologically consistent with Helicobacter pylori.  RADIOGRAPHIC STUDIES: I have personally reviewed the radiological images as listed and agreed with the findings in the report.  11/25/2017 CT CAP W Contrast IMPRESSION: 1. Mixed appearance, with the gastric fundal mass thicker than it was on 08/29/2017 (although still improved compared to 06/05/2017), but with reduced local conglomerate adenopathy along the splenic hilum compared to 08/29/2017. 2. At this time I do not see any definite hepatic metastatic lesions. Several lesions of suspicion in the left hepatic lobe and inferiorly in the right hepatic lobe are not readily visible today. There is a small focus of hypodensity adjacent to the gallbladder fossa in segment 7 which is technically nonspecific but which could reflect focal fatty infiltration. 3. Increase in nodular contour of scarring in the left lower lobe. This is probably incidental but I cannot completely exclude the possibility of a localized metastatic lesion. Surveillance of the left lower lobe suggested. 4. Bosniak category 77F cyst of the right kidney lower pole with internal septation and calcification. Surveillance of this lesion is suggested; I suspect that the patient's cancer surveillance will supersede the typical recommended surveillance schedule for complex cyst. 5. Other imaging findings of potential clinical significance: Aortic Atherosclerosis (ICD10-I70.0). Degenerative glenohumeral arthropathy bilaterally. Mild impingement at L4-5 and L5-S1.  Mr Abdomen W Wo Contrast  Result Date: 12/23/2017 CLINICAL DATA:  Gastric carcinoma. Undergoing chemotherapy. Indeterminate liver lesion on recent CT. EXAM: MRI ABDOMEN WITHOUT AND WITH CONTRAST TECHNIQUE: Multiplanar multisequence MR  imaging of the abdomen was performed both before and after the administration of intravenous contrast. CONTRAST:  96m MULTIHANCE GADOBENATE DIMEGLUMINE 529 MG/ML IV SOLN COMPARISON:  CT on 11/25/2017 FINDINGS: Lower chest: No acute findings. Hepatobiliary: Diffusely decreased T2 signal of the hepatic parenchyma seen as well as the splenic parenchyma and bone marrow. This is consistent with iron deposition. A focal area of sparing is seen in the inferior right hepatic lobe corresponding with the lesion on recent CT, and other areas of sparing are seen in the peripheral right hepatic lobe more superiorly. No liver masses are identified. Gallbladder is unremarkable. No evidence of biliary ductal dilatation. Pancreas: Normal signal intensity. No mass or inflammatory changes. Spleen: Diffusely decreased T2 signal, consistent with iron deposition. No evidence of splenomegaly or splenic masses. Adrenals/Urinary Tract: A small complex cyst containing a few thin internal septations is again seen in the lower pole the right kidney measuring 1.9 cm. This is consistent with a probably benign Bosniak category 2 F cyst. A few other tiny sub-cm cysts are noted bilaterally. No evidence of solid renal mass or hydronephrosis. Stomach/Bowel: Ill-defined soft tissue mass in the fundus of the stomach shows no significant change. Vascular/Lymphatic: No pathologically enlarged lymph nodes identified. No abdominal aortic aneurysm. Other:  None. Musculoskeletal:  No suspicious bone lesions identified. IMPRESSION: Ill-defined soft tissue mass in the gastric fundus shows no significant change, consistent known gastric adenocarcinoma. No definite liver metastases identified. Hemosiderosis noted, with focal area of sparing seen in the right hepatic lobe which accounts for the lesions seen on recent CT. Stable 1.9 cm probably benign Bosniak category 2 F cystic lesion in right kidney. Recommend continued attention on follow-up imaging.  Electronically Signed   By: JEarle GellM.D.   On: 12/23/2017 08:28  CT CAP W Contrast 06/05/17 IMPRESSION: 9.1 cm fungating mass in the gastric cardia/posterior gastric fundus, corresponding to the patient's newly diagnosed gastric Cancer. Associated extra gastric extension with peritoneal disease in the left upper abdomen, including a dominant 4.2 cm peritoneal implant. Small volume pelvic ascites. 1.8 cm hypoenhancing lesion along the inferior spleen, indeterminate.  PROCEDURE  Esophagogastroduodenoscopy 06/04/17 by Dr. Benson Norway Findings: The esophagus was normal. A large, fungating and ulcerated, non-circumferential mass with no bleeding and no stigmata of recent bleeding was found in the cardia and in the gastric fundus. Biopsies were taken with a cold forceps for histology. The examined duodenum was normal. A large friable fungating gastric mass involving the cardia and the fundus was identified. The mass extended approximately 5 cm. It was difficult to measure the width of the lesion. There was a large necrotic and deep ulcer in the mid portion of the mass. It is estimated that it encompasses 25% of the fundic circumfirence. Multiple cold biopsies were obtained. Impression: Normal esophagus. Malignant gastric tumor in the cardia and in the  gastric fundus. Biopsied. Normal examined duodenum.  ASSESSMENT & PLAN:  Ryan Collins 57 y.o. male, without significant past medical history, but also does not  see doctors routinely, presented with worsening fatigue, intermittent epigastric pain and melena for 3-4 months, and 50 pounds weight loss over the past 6 months.  1.  Gastric cancer, T4bNxM1 with peritoneal metastasis, adenocarcinoma, HER2(-), MSI-stable, PD-L1positive (5%) -I previously reviewed his CT scan findings, endoscopy and biopsy results of the gastric mass with patient in details. He has a large mass in the cardia and in the gastric fundus, with evidence of extra gastric extension, and  the peritoneum metastasis on CT scan.  No other distant metastasis. -Due to the peritoneal metastasis, unfortunately his cancer is not curable at this stage. We previously discussed the role of HIPEC if he has excellent response to chemotherapy, and no evidence of other metastasis, I may consider refer him to Dr. Lennie Odor in the future. Although due to the aggressive nature of gastric cancer, HIPEC is usually not offered.  -He started first-line chemotherapy with FOLFOX on 06/26/17, he has been tolerating well so far. -Goal of therapy is palliative. -His tumor is negative for HER-2, MSI-stable, PD-L1positive (5).  He would be a candidate for immunotherapy as third line -His tumor marker has been trending down, CEA 11.02 on 09/04/17.  -His CT CAP W Contrast from 08/29/17 revealed a good partial response except one indeterminate lesion in left lobe of liver. I have personally reviewed his recent restaging CT, and discussed with radiologist Dr. [name]. I agree with his interpretation. I recommended continuing chemo at that time. Pt agreed. -he has a right inguinal hernia onset in Jan 2019 and expressed interest in having it repaired. -He is clinically doing very well, his abdominal pain has near resolved, tolerating chemo treatment well, tumor marker CA has dropped to normal. -I personally reviewed his restaging CT scan from November 25, 2017 and explained to pt, which showed overall improvement compared to January 2019, but is a gastric fundal mass is slightly thicker than it was on April 2019.  No significant peritoneal disease or other metastasis, but there was a small focus of hypodensity adjacent to the gallbladder in the liver.  -I obtained a abdominal MRI on December 23, 2017, which showed no liver metastasis. -We previously discussed his case in GI tumor board, the previous peritoneal mass felt to be the extension of his gastric mass, no other  extensive peritoneal metastasis seen on the scan.  Dr. Barry Dienes will  see patient to discuss potential surgical resection. I referred him.  -He is clinically doing well, little more fatigued from chemotherapy, it has been taking longer to recover.  He has some developed a mild peripheral neuropathy, no impact on his functions, I will reduce his oxaliplatin dose to 70 mg/m from now on. -Lab reviewed, adequate for treatment, will continue FOLFOX every 2 weeks.  2.  Anemia of iron deficiency and tumor bleeding, B12 deficiency -He has clinical GI bleeding with melena, iron study also showed iron deficiency. -He has received blood transfusion, and 2 dose of IV Feraheme, repeated iron level was adequate.   -We continue to monitor his CBC and iron studies closely -He will continue Oral-B complex   3. History of DVT, ?  Protein C deficiency -he was on coumadin for his DVT before, and previous labs showed a protein C deficiency, which increases his risk of thrombosis. -We previously discussed the high risk of thrombosis secondary to his underlying malignancy. -Due to his chronic GI bleeding from his tumor, I would not do prophylactic anticoagulation.  4. History of alcohol and marijuana abuse -He has quit drinking alcohol since his diagnosis of cancer -He does smoke marijuana occasionally  5. Weight loss and malnutrition  -He has lost significant weight since the diagnosis -Previously encouraged him to increase his nutrition supplement.  He has been seen by our dietitian, will follow-up next week. -weight is stable overall   6. Goal of care discussion  -We previously discussed the incurable nature of his cancer, and the overall poor prognosis, especially if he does not have good response to chemotherapy or progress on chemo -The patient understands the goal of care is palliative. -he is full code now   7.  Right inguinal hernia -He will discuss with Dr. Barry Dienes for consideration of right inguinal hernia repair.   8.  Peripheral neuropathy, grade 1, second to  chemotherapy oxaliplatin -He has a mild numbness on his fingers, no impact on his hand function, no neuropathy on his feet -I will reduce his oxaliplatin dose, and watch his neuropathy closely.    PLAN: -Labs reviewed, proceed with cycle 14 FOLFOX today, continue q2 weeks -He will see Dr. Barry Dienes soon to discuss surgery, I sent a message to Dr. Barry Dienes today. -f/u in 2 weeks    All questions were answered. The patient knows to call the clinic with any problems, questions or concerns.  I spent 20 minutes counseling the patient face to face. The total time spent in the appointment was 25 minutes and more than 50% was on counseling.  Dierdre Searles Dweik am acting as scribe for Dr. [name].  I have reviewed the above documentation for accuracy and completeness, and I agree with the above.    [name], MD 12/26/2017 10:37 AM

## 2017-12-23 ENCOUNTER — Ambulatory Visit (HOSPITAL_COMMUNITY)
Admission: RE | Admit: 2017-12-23 | Discharge: 2017-12-23 | Disposition: A | Discharge status: Home / Self Care | Payer: BLUE CROSS/BLUE SHIELD | Source: Ambulatory Visit | Attending: Nurse Practitioner | Admitting: Nurse Practitioner

## 2017-12-23 DIAGNOSIS — K769 Liver disease, unspecified: Secondary | ICD-10-CM | POA: Diagnosis not present

## 2017-12-23 DIAGNOSIS — C169 Malignant neoplasm of stomach, unspecified: Secondary | ICD-10-CM | POA: Diagnosis not present

## 2017-12-23 MED ORDER — GADOBENATE DIMEGLUMINE 529 MG/ML IV SOLN
15.0000 mL | Freq: Once | INTRAVENOUS | Status: AC | PRN
  Administered 2017-12-23: 15 mL via INTRAVENOUS

## 2017-12-26 ENCOUNTER — Inpatient Hospital Stay: Payer: BLUE CROSS/BLUE SHIELD

## 2017-12-26 ENCOUNTER — Telehealth: Payer: Self-pay | Admitting: Hematology

## 2017-12-26 ENCOUNTER — Inpatient Hospital Stay (HOSPITAL_BASED_OUTPATIENT_CLINIC_OR_DEPARTMENT_OTHER): Payer: BLUE CROSS/BLUE SHIELD | Admitting: Hematology

## 2017-12-26 ENCOUNTER — Encounter: Payer: Self-pay | Admitting: Hematology

## 2017-12-26 VITALS — BP 96/69 | HR 68 | Temp 97.7°F | Resp 18 | Ht 71.5 in | Wt 166.6 lb

## 2017-12-26 DIAGNOSIS — F1011 Alcohol abuse, in remission: Secondary | ICD-10-CM

## 2017-12-26 DIAGNOSIS — C161 Malignant neoplasm of fundus of stomach: Secondary | ICD-10-CM

## 2017-12-26 DIAGNOSIS — K921 Melena: Secondary | ICD-10-CM

## 2017-12-26 DIAGNOSIS — D500 Iron deficiency anemia secondary to blood loss (chronic): Secondary | ICD-10-CM

## 2017-12-26 DIAGNOSIS — R109 Unspecified abdominal pain: Secondary | ICD-10-CM

## 2017-12-26 DIAGNOSIS — C786 Secondary malignant neoplasm of retroperitoneum and peritoneum: Secondary | ICD-10-CM | POA: Diagnosis not present

## 2017-12-26 DIAGNOSIS — F1210 Cannabis abuse, uncomplicated: Secondary | ICD-10-CM

## 2017-12-26 DIAGNOSIS — G620 Drug-induced polyneuropathy: Secondary | ICD-10-CM

## 2017-12-26 DIAGNOSIS — Z9221 Personal history of antineoplastic chemotherapy: Secondary | ICD-10-CM

## 2017-12-26 DIAGNOSIS — Z5111 Encounter for antineoplastic chemotherapy: Secondary | ICD-10-CM | POA: Diagnosis not present

## 2017-12-26 DIAGNOSIS — E538 Deficiency of other specified B group vitamins: Secondary | ICD-10-CM

## 2017-12-26 DIAGNOSIS — D6859 Other primary thrombophilia: Secondary | ICD-10-CM

## 2017-12-26 DIAGNOSIS — Z86718 Personal history of other venous thrombosis and embolism: Secondary | ICD-10-CM

## 2017-12-26 DIAGNOSIS — K4090 Unilateral inguinal hernia, without obstruction or gangrene, not specified as recurrent: Secondary | ICD-10-CM

## 2017-12-26 DIAGNOSIS — E46 Unspecified protein-calorie malnutrition: Secondary | ICD-10-CM

## 2017-12-26 LAB — CBC WITH DIFFERENTIAL (CANCER CENTER ONLY)
BASOS PCT: 1 %
Basophils Absolute: 0 10*3/uL (ref 0.0–0.1)
EOS ABS: 0 10*3/uL (ref 0.0–0.5)
Eosinophils Relative: 1 %
HCT: 36.2 % — ABNORMAL LOW (ref 38.4–49.9)
HEMOGLOBIN: 11.9 g/dL — AB (ref 13.0–17.1)
Lymphocytes Relative: 37 %
Lymphs Abs: 1.5 10*3/uL (ref 0.9–3.3)
MCH: 29.1 pg (ref 27.2–33.4)
MCHC: 32.9 g/dL (ref 32.0–36.0)
MCV: 88.3 fL (ref 79.3–98.0)
Monocytes Absolute: 0.7 10*3/uL (ref 0.1–0.9)
Monocytes Relative: 17 %
NEUTROS PCT: 44 %
Neutro Abs: 1.8 10*3/uL (ref 1.5–6.5)
Platelet Count: 193 10*3/uL (ref 140–400)
RBC: 4.1 MIL/uL — AB (ref 4.20–5.82)
RDW: 17.3 % — ABNORMAL HIGH (ref 11.0–14.6)
WBC: 4.1 10*3/uL (ref 4.0–10.3)

## 2017-12-26 LAB — IRON AND TIBC
Iron: 85 ug/dL (ref 42–163)
SATURATION RATIOS: 34 % — AB (ref 42–163)
TIBC: 250 ug/dL (ref 202–409)
UIBC: 165 ug/dL

## 2017-12-26 LAB — CMP (CANCER CENTER ONLY)
ALK PHOS: 55 U/L (ref 38–126)
ALT: 17 U/L (ref 0–44)
ANION GAP: 9 (ref 5–15)
AST: 24 U/L (ref 15–41)
Albumin: 3.6 g/dL (ref 3.5–5.0)
BUN: 8 mg/dL (ref 6–20)
CALCIUM: 9 mg/dL (ref 8.9–10.3)
CO2: 25 mmol/L (ref 22–32)
CREATININE: 0.79 mg/dL (ref 0.61–1.24)
Chloride: 104 mmol/L (ref 98–111)
Glucose, Bld: 94 mg/dL (ref 70–99)
Potassium: 4 mmol/L (ref 3.5–5.1)
Sodium: 138 mmol/L (ref 135–145)
Total Bilirubin: 0.6 mg/dL (ref 0.3–1.2)
Total Protein: 6.8 g/dL (ref 6.5–8.1)

## 2017-12-26 LAB — CEA (IN HOUSE-CHCC): CEA (CHCC-IN HOUSE): 9.46 ng/mL — AB (ref 0.00–5.00)

## 2017-12-26 LAB — FERRITIN: Ferritin: 343 ng/mL — ABNORMAL HIGH (ref 24–336)

## 2017-12-26 MED ORDER — DEXTROSE 5 % IV SOLN
INTRAVENOUS | Status: DC
  Administered 2017-12-26: 11:00:00 via INTRAVENOUS
  Filled 2017-12-26: qty 250

## 2017-12-26 MED ORDER — DEXTROSE 5 % IV SOLN
Freq: Once | INTRAVENOUS | Status: AC
  Administered 2017-12-26: 10:00:00 via INTRAVENOUS
  Filled 2017-12-26: qty 250

## 2017-12-26 MED ORDER — OXALIPLATIN CHEMO INJECTION 100 MG/20ML
70.0000 mg/m2 | Freq: Once | INTRAVENOUS | Status: AC
  Administered 2017-12-26: 135 mg via INTRAVENOUS
  Filled 2017-12-26: qty 27

## 2017-12-26 MED ORDER — PALONOSETRON HCL INJECTION 0.25 MG/5ML
INTRAVENOUS | Status: AC
  Filled 2017-12-26: qty 5

## 2017-12-26 MED ORDER — DEXAMETHASONE SODIUM PHOSPHATE 10 MG/ML IJ SOLN
INTRAMUSCULAR | Status: AC
  Filled 2017-12-26: qty 1

## 2017-12-26 MED ORDER — LEUCOVORIN CALCIUM INJECTION 350 MG
400.0000 mg/m2 | Freq: Once | INTRAVENOUS | Status: AC
  Administered 2017-12-26: 780 mg via INTRAVENOUS
  Filled 2017-12-26: qty 39

## 2017-12-26 MED ORDER — VITAMIN B-12 1000 MCG PO TABS: 1000.0000 ug | ORAL_TABLET | Freq: Every day | ORAL | 0 refills | Status: AC

## 2017-12-26 MED ORDER — DEXAMETHASONE SODIUM PHOSPHATE 10 MG/ML IJ SOLN
10.0000 mg | Freq: Once | INTRAMUSCULAR | Status: AC
  Administered 2017-12-26: 10 mg via INTRAVENOUS

## 2017-12-26 MED ORDER — FLUOROURACIL CHEMO INJECTION 2.5 GM/50ML
400.0000 mg/m2 | Freq: Once | INTRAVENOUS | Status: AC
  Administered 2017-12-26: 800 mg via INTRAVENOUS
  Filled 2017-12-26: qty 16

## 2017-12-26 MED ORDER — SODIUM CHLORIDE 0.9 % IV SOLN
2400.0000 mg/m2 | INTRAVENOUS | Status: DC
  Administered 2017-12-26: 4700 mg via INTRAVENOUS
  Filled 2017-12-26: qty 94

## 2017-12-26 MED ORDER — PALONOSETRON HCL INJECTION 0.25 MG/5ML
0.2500 mg | Freq: Once | INTRAVENOUS | Status: AC
  Administered 2017-12-26: 0.25 mg via INTRAVENOUS

## 2017-12-26 MED ORDER — SODIUM CHLORIDE 0.9% FLUSH
10.0000 mL | INTRAVENOUS | Status: DC | PRN
  Administered 2017-12-26: 10 mL via INTRAVENOUS
  Filled 2017-12-26: qty 10

## 2017-12-26 NOTE — Progress Notes (Signed)
Patient aware to return Sat 8/17 at noon.

## 2017-12-26 NOTE — Telephone Encounter (Signed)
No LOS 8/15

## 2017-12-26 NOTE — Patient Instructions (Signed)
[facility] Discharge Instructions for Patients Receiving Chemotherapy  Today you received the following chemotherapy agents: Oxaliplatin, leucovorin, 5FU   To help prevent nausea and vomiting after your treatment, we encourage you to take your nausea medication as directed.    If you develop nausea and vomiting that is not controlled by your nausea medication, call the clinic.   BELOW ARE SYMPTOMS THAT SHOULD BE REPORTED IMMEDIATELY:  *FEVER GREATER THAN 100.5 F  *CHILLS WITH OR WITHOUT FEVER  NAUSEA AND VOMITING THAT IS NOT CONTROLLED WITH YOUR NAUSEA MEDICATION  *UNUSUAL SHORTNESS OF BREATH  *UNUSUAL BRUISING OR BLEEDING  TENDERNESS IN MOUTH AND THROAT WITH OR WITHOUT PRESENCE OF ULCERS  *URINARY PROBLEMS  *BOWEL PROBLEMS  UNUSUAL RASH Items with * indicate a potential emergency and should be followed up as soon as possible.  Feel free to call the clinic should you have any questions or concerns. The clinic phone number is [phone].  Please show the CHEMO ALERT CARD at check-in to the Emergency Department and triage nurse.   

## 2017-12-28 ENCOUNTER — Inpatient Hospital Stay: Payer: BLUE CROSS/BLUE SHIELD

## 2017-12-28 VITALS — BP 106/76 | HR 88 | Temp 99.5°F | Resp 18

## 2017-12-28 DIAGNOSIS — Z5111 Encounter for antineoplastic chemotherapy: Secondary | ICD-10-CM | POA: Diagnosis not present

## 2017-12-28 DIAGNOSIS — D500 Iron deficiency anemia secondary to blood loss (chronic): Secondary | ICD-10-CM

## 2017-12-28 MED ORDER — SODIUM CHLORIDE 0.9% FLUSH
10.0000 mL | INTRAVENOUS | Status: DC | PRN
  Filled 2017-12-28: qty 10

## 2017-12-28 MED ORDER — HEPARIN SOD (PORK) LOCK FLUSH 100 UNIT/ML IV SOLN
250.0000 [IU] | Freq: Once | INTRAVENOUS | Status: AC | PRN
  Administered 2017-12-28: 500 [IU]
  Filled 2017-12-28: qty 5

## 2017-12-28 NOTE — Patient Instructions (Signed)

## 2018-01-02 ENCOUNTER — Other Ambulatory Visit: Payer: Self-pay | Admitting: General Surgery

## 2018-01-08 ENCOUNTER — Ambulatory Visit (INDEPENDENT_AMBULATORY_CARE_PROVIDER_SITE_OTHER): Payer: BLUE CROSS/BLUE SHIELD | Admitting: Family Medicine

## 2018-01-08 ENCOUNTER — Encounter: Payer: Self-pay | Admitting: Family Medicine

## 2018-01-08 ENCOUNTER — Other Ambulatory Visit: Payer: Self-pay

## 2018-01-08 VITALS — BP 101/78 | HR 107 | Temp 98.6°F | Resp 16 | Ht 71.5 in | Wt 154.0 lb

## 2018-01-08 DIAGNOSIS — Z23 Encounter for immunization: Secondary | ICD-10-CM

## 2018-01-08 DIAGNOSIS — I10 Essential (primary) hypertension: Secondary | ICD-10-CM

## 2018-01-08 DIAGNOSIS — C161 Malignant neoplasm of fundus of stomach: Secondary | ICD-10-CM | POA: Diagnosis not present

## 2018-01-08 LAB — POCT URINALYSIS DIPSTICK
Blood, UA: NEGATIVE
Glucose, UA: NEGATIVE
Ketones, UA: 15
Leukocytes, UA: NEGATIVE
Nitrite, UA: NEGATIVE
Protein, UA: POSITIVE — AB
Spec Grav, UA: >=1.03 — AB (ref 1.010–1.025)
Urobilinogen, UA: 1 E.U./dL
pH, UA: 5.5 (ref 5.0–8.0)

## 2018-01-08 MED ORDER — AMLODIPINE BESYLATE 5 MG PO TABS: 5.0000 mg | ORAL_TABLET | Freq: Every day | ORAL | 11 refills | Status: DC

## 2018-01-08 NOTE — Patient Instructions (Signed)

## 2018-01-08 NOTE — Progress Notes (Signed)
Patient [facility]  Provider: Lanae Boast, FNP   Hypertension Follow Up Visit  SUBJECTIVE:  Ryan Collins is a 57 y.o. male who  has a past medical history of DVT (deep venous thrombosis) ([place]) (2011), GI bleeding (2001), History of DVT of lower extremity, Melena (05/2017), and Stomach ulcer.   Patient is currently undergoing  chemotherapy for gastric cancer. Patient denies colostomy hx. He states that he will have gastric surgery with a placement of feeding tube within the next 6 weeks.  Patient states that he is doing ok today.    Current Outpatient Medications  Medication Sig Dispense Refill  . acetaminophen (TYLENOL) 500 MG tablet Take 1,000 mg by mouth every 6 (six) hours as needed for mild pain.     Marland Kitchen amLODipine (NORVASC) 5 MG tablet Take 1 tablet (5 mg total) by mouth daily. 30 tablet 5  . Blood Pressure Monitoring (BLOOD PRESSURE MONITOR/M CUFF) MISC 1 each by Does not apply route daily. 1 each 0  . lidocaine-prilocaine (EMLA) cream Apply 1 application topically as needed (for port).    . mirtazapine (REMERON) 7.5 MG tablet Take 1 tablet (7.5 mg total) by mouth at bedtime. 30 tablet 1  . ondansetron (ZOFRAN) 8 MG tablet Take 8 mg by mouth 2 (two) times daily as needed for refractory nausea / vomiting. Start on day 3 after chemo    . pantoprazole (PROTONIX) 40 MG tablet Take 1 tablet (40 mg total) by mouth 2 (two) times daily. 60 tablet 2  . prochlorperazine (COMPAZINE) 10 MG tablet Take 1 tablet (10 mg total) by mouth every 6 (six) hours as needed for nausea or vomiting. 30 tablet 3  . thiamine 100 MG tablet Take 1 tablet (100 mg total) by mouth daily. 30 tablet 3  . vitamin B-12 (CYANOCOBALAMIN) 1000 MCG tablet Take 1 tablet (1,000 mcg total) by mouth daily. 30 tablet 0   No current facility-administered medications for this visit.     Recent Results (from the past 2160 hour(s))  CBC with Differential (Cancer Center Only)      Status: Abnormal   Collection Time: 10/16/17 11:59 AM  Result Value Ref Range   WBC Count 5.7 4.0 - 10.3 K/uL   RBC 4.06 (L) 4.20 - 5.82 MIL/uL   Hemoglobin 11.8 (L) 13.0 - 17.1 g/dL   HCT 36.1 (L) 38.4 - 49.9 %   MCV 88.8 79.3 - 98.0 fL   MCH 29.1 27.2 - 33.4 pg   MCHC 32.8 32.0 - 36.0 g/dL   RDW 18.3 (H) 11.0 - 14.6 %   Platelet Count 218 140 - 400 K/uL   Neutrophils Relative % 34 %   Neutro Abs 2.0 1.5 - 6.5 K/uL   Lymphocytes Relative 42 %   Lymphs Abs 2.4 0.9 - 3.3 K/uL   Monocytes Relative 19 %   Monocytes Absolute 1.1 (H) 0.1 - 0.9 K/uL   Eosinophils Relative 4 %   Eosinophils Absolute 0.2 0.0 - 0.5 K/uL   Basophils Relative 1 %   Basophils Absolute 0.0 0.0 - 0.1 K/uL    Comment: Performed at [facility], [address] [place], [place] [address]  CMP ([place] only)     Status: None   Collection Time: 10/16/17 11:59 AM  Result Value Ref Range   Sodium 137 136 - 145 mmol/L   Potassium 3.9 3.5 - 5.1 mmol/L   Chloride 106 98 - 109 mmol/L   CO2  25 22 - 29 mmol/L   Glucose, Bld 92 70 - 140 mg/dL   BUN 10 7 - 26 mg/dL   Creatinine 0.79 0.70 - 1.30 mg/dL   Calcium 9.3 8.4 - 10.4 mg/dL   Total Protein 7.1 6.4 - 8.3 g/dL   Albumin 4.0 3.5 - 5.0 g/dL   AST 31 5 - 34 U/L   ALT 18 0 - 55 U/L   Alkaline Phosphatase 59 40 - 150 U/L   Total Bilirubin 0.5 0.2 - 1.2 mg/dL   GFR, Est Non Af Am >60 >60 mL/min   GFR, Est AFR Am >60 >60 mL/min    Comment: (NOTE) The eGFR has been calculated using the CKD EPI equation. This calculation has not been validated in all clinical situations. eGFR's persistently <60 mL/min signify possible Chronic Kidney Disease.    Anion gap 6 3 - 11    Comment: Performed at [facility], [address] [place], [place] [address]  Iron and TIBC     Status: Abnormal   Collection Time: 10/30/17  9:55 AM  Result Value Ref Range   Iron 85 42 - 163 ug/dL   TIBC 261 202 - 409 ug/dL   Saturation  Ratios 32 (L) 42 - 163 %   UIBC 177 ug/dL    Comment: Performed at [facility], [place] [address], [place], [place] [address]  Ferritin     Status: Abnormal   Collection Time: 10/30/17  9:55 AM  Result Value Ref Range   Ferritin 348 (H) 22 - 316 ng/mL    Comment: Performed at [facility], [place] [address], [place], [place] [address]  CEA (IN HOUSE-[facility])     Status: None   Collection Time: 10/30/17  9:55 AM  Result Value Ref Range   CEA ([facility]-In House) 4.31 0.00 - 5.00 ng/mL    Comment: Performed at [facility], [place] [address], [place], [place] [address]  CMP ([place] only)     Status: Abnormal   Collection Time: 10/30/17  9:55 AM  Result Value Ref Range   Sodium 135 (L) 136 - 145 mmol/L   Potassium 4.3 3.5 - 5.1 mmol/L   Chloride 104 98 - 109 mmol/L   CO2 23 22 - 29 mmol/L   Glucose, Bld 94 70 - 140 mg/dL   BUN 14 7 - 26 mg/dL   Creatinine 0.89 0.70 - 1.30 mg/dL   Calcium 9.6 8.4 - 10.4 mg/dL   Total Protein 7.4 6.4 - 8.3 g/dL   Albumin 4.0 3.5 - 5.0 g/dL   AST 29 5 - 34 U/L   ALT 16 0 - 55 U/L   Alkaline Phosphatase 60 40 - 150 U/L   Total Bilirubin 0.6 0.2 - 1.2 mg/dL   GFR, Est Non Af Am >60 >60 mL/min   GFR, Est AFR Am >60 >60 mL/min    Comment: (NOTE) The eGFR has been calculated using the CKD EPI equation. This calculation has not been validated in all clinical situations. eGFR's persistently <60 mL/min signify possible Chronic Kidney Disease.    Anion gap 8 3 - 11    Comment: Performed at [facility], [address], [place], [place] [address]  CBC with Differential ([place] Only)     Status: Abnormal   Collection Time: 10/30/17  9:55 AM  Result Value Ref Range   WBC Count 6.0 4.0 - 10.3 K/uL   RBC  4.28 4.20 - 5.82 MIL/uL   Hemoglobin 12.7 (L) 13.0 - 17.1 g/dL   HCT 38.2 (L) 38.4 - 49.9 %   MCV 89.3 79.3 - 98.0 fL   MCH 29.7 27.2 - 33.4 pg   MCHC 33.3  32.0 - 36.0 g/dL   RDW 17.4 (H) 11.0 - 14.6 %   Platelet Count 199 140 - 400 K/uL   Neutrophils Relative % 48 %   Neutro Abs 2.9 1.5 - 6.5 K/uL   Lymphocytes Relative 31 %   Lymphs Abs 1.9 0.9 - 3.3 K/uL   Monocytes Relative 19 %   Monocytes Absolute 1.1 (H) 0.1 - 0.9 K/uL   Eosinophils Relative 1 %   Eosinophils Absolute 0.1 0.0 - 0.5 K/uL   Basophils Relative 1 %   Basophils Absolute 0.1 0.0 - 0.1 K/uL    Comment: Performed at [facility], [place] [address] [place], [place] [address]  CBC with Differential ([place] Only)     Status: Abnormal   Collection Time: 11/13/17  8:52 AM  Result Value Ref Range   WBC Count 5.6 4.0 - 10.3 K/uL   RBC 4.08 (L) 4.20 - 5.82 MIL/uL   Hemoglobin 11.9 (L) 13.0 - 17.1 g/dL   HCT 36.7 (L) 38.4 - 49.9 %   MCV 90.0 79.3 - 98.0 fL   MCH 29.3 27.2 - 33.4 pg   MCHC 32.5 32.0 - 36.0 g/dL   RDW 17.5 (H) 11.0 - 14.6 %   Platelet Count 181 140 - 400 K/uL   Neutrophils Relative % 45 %   Neutro Abs 2.5 1.5 - 6.5 K/uL   Lymphocytes Relative 32 %   Lymphs Abs 1.8 0.9 - 3.3 K/uL   Monocytes Relative 21 %   Monocytes Absolute 1.2 (H) 0.1 - 0.9 K/uL   Eosinophils Relative 2 %   Eosinophils Absolute 0.1 0.0 - 0.5 K/uL   Basophils Relative 0 %   Basophils Absolute 0.0 0.0 - 0.1 K/uL    Comment: Performed at [facility], [place] [address] [place], [place] [address]  CMP ([place] only)     Status: Abnormal   Collection Time: 11/13/17  8:52 AM  Result Value Ref Range   Sodium 137 135 - 145 mmol/L    Comment: Please note reference intervals were recently updated.   Potassium 4.0 3.5 - 5.1 mmol/L   Chloride 106 98 - 111 mmol/L   CO2 27 22 - 32 mmol/L   Glucose, Bld 95 70 - 99 mg/dL   BUN 12 6 - 20 mg/dL    Comment: Please note change in reference range.   Creatinine 1.08 0.61 - 1.24 mg/dL   Calcium 9.2 8.9 - 10.3 mg/dL   Total Protein 6.8 6.5 - 8.1 g/dL   Albumin 3.7 3.5 - 5.0 g/dL   AST 24 15 -  41 U/L   ALT 22 0 - 44 U/L   Alkaline Phosphatase 62 38 - 126 U/L   Total Bilirubin 0.3 0.3 - 1.2 mg/dL   GFR, Est Non Af Am >60 >60 mL/min   GFR, Est AFR Am >60 >60 mL/min    Comment: (NOTE) The eGFR has been calculated using the CKD EPI equation. This calculation has not been validated in all clinical situations. eGFR's persistently <60 mL/min signify possible Chronic Kidney Disease.    Anion gap 4 (L) 5 - 15    Comment: Performed at [facility], [address]  [place], [place] [address]  Iron and TIBC     Status: Abnormal   Collection Time: 11/27/17  9:14 AM  Result Value Ref Range   Iron 66 42 - 163 ug/dL   TIBC 260 202 - 409 ug/dL   Saturation Ratios 25 (L) 42 - 163 %   UIBC 194 ug/dL    Comment: Performed at [facility], [place] [address], [place], [place] [address]  Ferritin     Status: None   Collection Time: 11/27/17  9:14 AM  Result Value Ref Range   Ferritin 267 24 - 336 ng/mL    Comment: Performed at [facility], [place] [address], [place], [place] [address]  CEA (IN HOUSE-[facility])     Status: Abnormal   Collection Time: 11/27/17  9:15 AM  Result Value Ref Range   CEA ([facility]-In House) 7.56 (H) 0.00 - 5.00 ng/mL    Comment: Performed at [facility], [place] [address], [place], [place] [address]  CMP ([place] only)     Status: None   Collection Time: 11/27/17  9:15 AM  Result Value Ref Range   Sodium 135 135 - 145 mmol/L    Comment: Please note reference intervals were recently updated.   Potassium 3.8 3.5 - 5.1 mmol/L   Chloride 104 98 - 111 mmol/L   CO2 26 22 - 32 mmol/L   Glucose, Bld 88 70 - 99 mg/dL   BUN 7 6 - 20 mg/dL    Comment: Please note change in reference range.   Creatinine 0.78 0.61 - 1.24 mg/dL   Calcium 9.2 8.9 - 10.3 mg/dL   Total Protein 6.9 6.5 - 8.1 g/dL   Albumin 3.8 3.5 - 5.0 g/dL   AST 22 15 - 41 U/L   ALT 19 0 - 44 U/L   Alkaline  Phosphatase 62 38 - 126 U/L   Total Bilirubin 0.5 0.3 - 1.2 mg/dL   GFR, Est Non Af Am >60 >60 mL/min   GFR, Est AFR Am >60 >60 mL/min    Comment: (NOTE) The eGFR has been calculated using the CKD EPI equation. This calculation has not been validated in all clinical situations. eGFR's persistently <60 mL/min signify possible Chronic Kidney Disease.    Anion gap 5 5 - 15    Comment: Performed at [facility], [address], [place], [place] [address]  CBC with Differential ([place] Only)     Status: Abnormal   Collection Time: 11/27/17  9:15 AM  Result Value Ref Range   WBC Count 6.2 4.0 - 10.3 K/uL   RBC 4.22 4.20 - 5.82 MIL/uL   Hemoglobin 12.3 (L) 13.0 - 17.1 g/dL   HCT 36.7 (L) 38.4 - 49.9 %   MCV 87.0 79.3 - 98.0 fL   MCH 29.1 27.2 - 33.4 pg   MCHC 33.5 32.0 - 36.0 g/dL   RDW 16.6 (H) 11.0 - 14.6 %   Platelet Count 182 140 - 400 K/uL   Neutrophils Relative % 47 %   Neutro Abs 2.9 1.5 - 6.5 K/uL   Lymphocytes Relative 33 %   Lymphs Abs 2.0 0.9 - 3.3 K/uL   Monocytes Relative 19 %   Monocytes Absolute 1.2 (H) 0.1 - 0.9 K/uL   Eosinophils Relative 1 %   Eosinophils Absolute 0.1 0.0 - 0.5 K/uL   Basophils Relative 0 %   Basophils Absolute 0.0 0.0 - 0.1 K/uL    Comment:  Performed at [facility], [place] [address], [place], [place] [address]  CMP ([place] only)     Status: Abnormal   Collection Time: 12/11/17  8:15 AM  Result Value Ref Range   Sodium 136 135 - 145 mmol/L   Potassium 3.9 3.5 - 5.1 mmol/L   Chloride 105 98 - 111 mmol/L   CO2 25 22 - 32 mmol/L   Glucose, Bld 115 (H) 70 - 99 mg/dL   BUN 9 6 - 20 mg/dL   Creatinine 0.88 0.61 - 1.24 mg/dL   Calcium 9.3 8.9 - 10.3 mg/dL   Total Protein 7.1 6.5 - 8.1 g/dL   Albumin 3.8 3.5 - 5.0 g/dL   AST 20 15 - 41 U/L   ALT 14 0 - 44 U/L   Alkaline Phosphatase 58 38 - 126 U/L   Total Bilirubin 0.4 0.3 - 1.2 mg/dL   GFR, Est Non Af Am >60 >60 mL/min   GFR, Est  AFR Am >60 >60 mL/min    Comment: (NOTE) The eGFR has been calculated using the CKD EPI equation. This calculation has not been validated in all clinical situations. eGFR's persistently <60 mL/min signify possible Chronic Kidney Disease.    Anion gap 6 5 - 15    Comment: Performed at [facility], [address], [place], [place] [address]  CBC with Differential ([place] Only)     Status: Abnormal   Collection Time: 12/11/17  8:15 AM  Result Value Ref Range   WBC Count 5.1 4.0 - 10.3 K/uL   RBC 4.27 4.20 - 5.82 MIL/uL   Hemoglobin 12.6 (L) 13.0 - 17.1 g/dL   HCT 38.1 (L) 38.4 - 49.9 %   MCV 89.2 79.3 - 98.0 fL   MCH 29.4 27.2 - 33.4 pg   MCHC 33.0 32.0 - 36.0 g/dL   RDW 17.8 (H) 11.0 - 14.6 %   Platelet Count 191 140 - 400 K/uL   Neutrophils Relative % 41 %   Neutro Abs 2.1 1.5 - 6.5 K/uL   Lymphocytes Relative 39 %   Lymphs Abs 2.0 0.9 - 3.3 K/uL   Monocytes Relative 17 %   Monocytes Absolute 0.9 0.1 - 0.9 K/uL   Eosinophils Relative 2 %   Eosinophils Absolute 0.1 0.0 - 0.5 K/uL   Basophils Relative 1 %   Basophils Absolute 0.0 0.0 - 0.1 K/uL    Comment: Performed at [facility], [place] [address], [place], [place] [address]  Iron and TIBC     Status: Abnormal   Collection Time: 12/26/17  7:51 AM  Result Value Ref Range   Iron 85 42 - 163 ug/dL   TIBC 250 202 - 409 ug/dL   Saturation Ratios 34 (L) 42 - 163 %   UIBC 165 ug/dL    Comment: Performed at [facility], [place] [address] [place], [place] [address]  Ferritin     Status: Abnormal   Collection Time: 12/26/17  7:51 AM  Result Value Ref Range   Ferritin 343 (H) 24 - 336 ng/mL    Comment: Performed at [facility], [place] [address] [place], [place] [address]  CEA (IN HOUSE-[facility])     Status: Abnormal   Collection Time: 12/26/17  7:51 AM  Result Value Ref Range   CEA ([facility]-In House) 9.46 (H) 0.00 - 5.00 ng/mL     Comment: Performed at [facility], [address] [place], [place] [address]  [place] ([place] only)     Status: None   Collection Time: 12/26/17  7:51 AM  Result Value Ref Range   Sodium 138 135 - 145 mmol/L   Potassium 4.0 3.5 - 5.1 mmol/L   Chloride 104 98 - 111 mmol/L   CO2 25 22 - 32 mmol/L   Glucose, Bld 94 70 - 99 mg/dL   BUN 8 6 - 20 mg/dL   Creatinine 0.79 0.61 - 1.24 mg/dL   Calcium 9.0 8.9 - 10.3 mg/dL   Total Protein 6.8 6.5 - 8.1 g/dL   Albumin 3.6 3.5 - 5.0 g/dL   AST 24 15 - 41 U/L   ALT 17 0 - 44 U/L   Alkaline Phosphatase 55 38 - 126 U/L   Total Bilirubin 0.6 0.3 - 1.2 mg/dL   GFR, Est Non Af Am >60 >60 mL/min   GFR, Est AFR Am >60 >60 mL/min    Comment: (NOTE) The eGFR has been calculated using the CKD EPI equation. This calculation has not been validated in all clinical situations. eGFR's persistently <60 mL/min signify possible Chronic Kidney Disease.    Anion gap 9 5 - 15    Comment: Performed at [facility], [address] [place], [place] [address]  CBC with Differential ([place] Only)     Status: Abnormal   Collection Time: 12/26/17  7:51 AM  Result Value Ref Range   WBC Count 4.1 4.0 - 10.3 K/uL   RBC 4.10 (L) 4.20 - 5.82 MIL/uL   Hemoglobin 11.9 (L) 13.0 - 17.1 g/dL   HCT 36.2 (L) 38.4 - 49.9 %   MCV 88.3 79.3 - 98.0 fL   MCH 29.1 27.2 - 33.4 pg   MCHC 32.9 32.0 - 36.0 g/dL   RDW 17.3 (H) 11.0 - 14.6 %   Platelet Count 193 140 - 400 K/uL   Neutrophils Relative % 44 %   Neutro Abs 1.8 1.5 - 6.5 K/uL   Lymphocytes Relative 37 %   Lymphs Abs 1.5 0.9 - 3.3 K/uL   Monocytes Relative 17 %   Monocytes Absolute 0.7 0.1 - 0.9 K/uL   Eosinophils Relative 1 %   Eosinophils Absolute 0.0 0.0 - 0.5 K/uL   Basophils Relative 1 %   Basophils Absolute 0.0 0.0 - 0.1 K/uL    Comment: Performed at [facility], [place] [address] [place], [place] [address]    Hypertension  ROS: Review of Systems  Constitutional: Negative.   HENT: Negative.   Eyes: Negative.   Respiratory: Negative.   Cardiovascular: Negative.   Gastrointestinal: Negative.        Gastric Cancer  Genitourinary: Negative.   Musculoskeletal: Negative.   Skin: Negative.   Neurological: Negative.   Psychiatric/Behavioral: Negative.      OBJECTIVE:   BP 101/78 (BP Location: Left Arm, Patient Position: Sitting, Cuff Size: Normal)   Pulse (!) 107   Temp 98.6 F (37 C) (Oral)   Resp 16   Ht 5' 11.5" (1.816 m)   Wt 154 lb (69.9 kg)   SpO2 100%   BMI 21.18 kg/m   Physical Exam  Constitutional: He is oriented to person, place, and time. He appears well-developed and well-nourished. No distress.  HENT:  Head: Normocephalic and atraumatic.  Eyes: Pupils are equal, round, and reactive to light. Conjunctivae and EOM are normal.  Neck: Normal range of motion. Neck supple.  Cardiovascular: Normal rate, regular  rhythm, normal heart sounds and intact distal pulses.  Pulmonary/Chest: Effort normal and breath sounds normal.  Abdominal: Soft. Bowel sounds are normal. He exhibits no distension. There is no tenderness.  Musculoskeletal: Normal range of motion.  Neurological: He is alert and oriented to person, place, and time.  Skin: Skin is warm and dry.  Psychiatric: He has a normal mood and affect. His behavior is normal. Judgment and thought content normal.  Nursing note and vitals reviewed.    ASSESSMENT/PLAN:  1. Flu vaccine need Administered - Flu Vaccine QUAD 6+ mos PF IM (Fluarix Quad PF)  2. Essential hypertension The current medical regimen is effective;  continue present plan and medications. - Urinalysis Dipstick  3. Malignant neoplasm of fundus of stomach [facility]) Patient followed by Dr. Burr Medico in the cancer center   Return to care as scheduled and prn. Patient verbalized understanding and agreed with plan of care.    Ms. Doug Sou. Nathaneil Canary, FNP-BC [facility] [address] [place], [place] [address] [phone]

## 2018-01-09 ENCOUNTER — Inpatient Hospital Stay: Payer: BLUE CROSS/BLUE SHIELD

## 2018-01-09 ENCOUNTER — Inpatient Hospital Stay (HOSPITAL_BASED_OUTPATIENT_CLINIC_OR_DEPARTMENT_OTHER): Payer: BLUE CROSS/BLUE SHIELD | Admitting: Nurse Practitioner

## 2018-01-09 ENCOUNTER — Encounter: Payer: Self-pay | Admitting: Nurse Practitioner

## 2018-01-09 VITALS — BP 99/72 | HR 85 | Temp 98.5°F | Resp 18 | Ht 71.5 in | Wt 157.4 lb

## 2018-01-09 VITALS — BP 99/65 | HR 91 | Temp 98.6°F | Resp 18

## 2018-01-09 DIAGNOSIS — C786 Secondary malignant neoplasm of retroperitoneum and peritoneum: Secondary | ICD-10-CM

## 2018-01-09 DIAGNOSIS — F17210 Nicotine dependence, cigarettes, uncomplicated: Secondary | ICD-10-CM

## 2018-01-09 DIAGNOSIS — C161 Malignant neoplasm of fundus of stomach: Secondary | ICD-10-CM | POA: Diagnosis not present

## 2018-01-09 DIAGNOSIS — K921 Melena: Secondary | ICD-10-CM | POA: Diagnosis not present

## 2018-01-09 DIAGNOSIS — Z5111 Encounter for antineoplastic chemotherapy: Secondary | ICD-10-CM | POA: Diagnosis not present

## 2018-01-09 DIAGNOSIS — Z9221 Personal history of antineoplastic chemotherapy: Secondary | ICD-10-CM

## 2018-01-09 DIAGNOSIS — D500 Iron deficiency anemia secondary to blood loss (chronic): Secondary | ICD-10-CM

## 2018-01-09 DIAGNOSIS — D6859 Other primary thrombophilia: Secondary | ICD-10-CM

## 2018-01-09 DIAGNOSIS — K4090 Unilateral inguinal hernia, without obstruction or gangrene, not specified as recurrent: Secondary | ICD-10-CM

## 2018-01-09 DIAGNOSIS — G620 Drug-induced polyneuropathy: Secondary | ICD-10-CM

## 2018-01-09 DIAGNOSIS — E46 Unspecified protein-calorie malnutrition: Secondary | ICD-10-CM

## 2018-01-09 DIAGNOSIS — Z95828 Presence of other vascular implants and grafts: Secondary | ICD-10-CM

## 2018-01-09 DIAGNOSIS — F1011 Alcohol abuse, in remission: Secondary | ICD-10-CM

## 2018-01-09 DIAGNOSIS — Z86718 Personal history of other venous thrombosis and embolism: Secondary | ICD-10-CM

## 2018-01-09 LAB — CBC WITH DIFFERENTIAL (CANCER CENTER ONLY)
BASOS ABS: 0 10*3/uL (ref 0.0–0.1)
BASOS PCT: 0 %
EOS ABS: 0 10*3/uL (ref 0.0–0.5)
Eosinophils Relative: 1 %
HCT: 36.8 % — ABNORMAL LOW (ref 38.4–49.9)
Hemoglobin: 12.3 g/dL — ABNORMAL LOW (ref 13.0–17.1)
LYMPHS ABS: 1.3 10*3/uL (ref 0.9–3.3)
Lymphocytes Relative: 27 %
MCH: 29.6 pg (ref 27.2–33.4)
MCHC: 33.3 g/dL (ref 32.0–36.0)
MCV: 89 fL (ref 79.3–98.0)
Monocytes Absolute: 1.1 10*3/uL — ABNORMAL HIGH (ref 0.1–0.9)
Monocytes Relative: 22 %
NEUTROS PCT: 50 %
Neutro Abs: 2.5 10*3/uL (ref 1.5–6.5)
PLATELETS: 232 10*3/uL (ref 140–400)
RBC: 4.14 MIL/uL — AB (ref 4.20–5.82)
RDW: 17 % — AB (ref 11.0–14.6)
WBC: 4.9 10*3/uL (ref 4.0–10.3)

## 2018-01-09 LAB — CMP (CANCER CENTER ONLY)
ALT: 13 U/L (ref 0–44)
AST: 21 U/L (ref 15–41)
Albumin: 3.6 g/dL (ref 3.5–5.0)
Alkaline Phosphatase: 62 U/L (ref 38–126)
Anion gap: 8 (ref 5–15)
BILIRUBIN TOTAL: 0.6 mg/dL (ref 0.3–1.2)
BUN: 15 mg/dL (ref 6–20)
CHLORIDE: 102 mmol/L (ref 98–111)
CO2: 24 mmol/L (ref 22–32)
Calcium: 9.3 mg/dL (ref 8.9–10.3)
Creatinine: 0.91 mg/dL (ref 0.61–1.24)
Glucose, Bld: 94 mg/dL (ref 70–99)
Potassium: 4.4 mmol/L (ref 3.5–5.1)
Sodium: 134 mmol/L — ABNORMAL LOW (ref 135–145)
TOTAL PROTEIN: 7 g/dL (ref 6.5–8.1)

## 2018-01-09 MED ORDER — SODIUM CHLORIDE 0.9 % IV SOLN
2400.0000 mg/m2 | INTRAVENOUS | Status: DC
  Administered 2018-01-09: 4700 mg via INTRAVENOUS
  Filled 2018-01-09: qty 94

## 2018-01-09 MED ORDER — DEXAMETHASONE SODIUM PHOSPHATE 10 MG/ML IJ SOLN
10.0000 mg | Freq: Once | INTRAMUSCULAR | Status: AC
  Administered 2018-01-09: 10 mg via INTRAVENOUS

## 2018-01-09 MED ORDER — LEUCOVORIN CALCIUM INJECTION 350 MG
400.0000 mg/m2 | Freq: Once | INTRAVENOUS | Status: AC
  Administered 2018-01-09: 780 mg via INTRAVENOUS
  Filled 2018-01-09: qty 39

## 2018-01-09 MED ORDER — PALONOSETRON HCL INJECTION 0.25 MG/5ML
INTRAVENOUS | Status: AC
  Filled 2018-01-09: qty 5

## 2018-01-09 MED ORDER — DEXTROSE 5 % IV SOLN
INTRAVENOUS | Status: DC
  Administered 2018-01-09: 13:00:00 via INTRAVENOUS
  Filled 2018-01-09: qty 250

## 2018-01-09 MED ORDER — DEXTROSE 5 % IV SOLN
Freq: Once | INTRAVENOUS | Status: AC
  Administered 2018-01-09: 13:00:00 via INTRAVENOUS
  Filled 2018-01-09: qty 250

## 2018-01-09 MED ORDER — OXALIPLATIN CHEMO INJECTION 100 MG/20ML
70.0000 mg/m2 | Freq: Once | INTRAVENOUS | Status: AC
  Administered 2018-01-09: 135 mg via INTRAVENOUS
  Filled 2018-01-09: qty 20

## 2018-01-09 MED ORDER — PALONOSETRON HCL INJECTION 0.25 MG/5ML
0.2500 mg | Freq: Once | INTRAVENOUS | Status: AC
  Administered 2018-01-09: 0.25 mg via INTRAVENOUS

## 2018-01-09 MED ORDER — DEXAMETHASONE SODIUM PHOSPHATE 10 MG/ML IJ SOLN
INTRAMUSCULAR | Status: AC
  Filled 2018-01-09: qty 1

## 2018-01-09 MED ORDER — FLUOROURACIL CHEMO INJECTION 2.5 GM/50ML
400.0000 mg/m2 | Freq: Once | INTRAVENOUS | Status: AC
  Administered 2018-01-09: 800 mg via INTRAVENOUS
  Filled 2018-01-09: qty 16

## 2018-01-09 MED ORDER — SODIUM CHLORIDE 0.9% FLUSH
10.0000 mL | INTRAVENOUS | Status: DC | PRN
  Administered 2018-01-09: 10 mL via INTRAVENOUS
  Filled 2018-01-09: qty 10

## 2018-01-09 NOTE — Progress Notes (Signed)
[place]  [phone] [phone]) [phone]  Clinic Follow up Note   Patient Care Team: Lanae Boast, FNP as PCP - General (Family Medicine) 01/09/2018  SUMMARY OF ONCOLOGIC HISTORY: Oncology History   Cancer Staging Gastric cancer [facility]) Staging form: Stomach, AJCC 8th Edition - Clinical stage from 06/04/2017: Stage IVB (cT4, cN0, cM1) - Signed by [name], MD on 06/10/2017       Gastric cancer ([place])   06/03/2017 - 06/06/2017 Hospital Admission    Admitted to the hospital on 06/03/17 with complaints of fatigue and melena. During his stay he underwent an endoscopy that revealed a malignant gastric tumor in the cardia and in the gastric fundus. Biopsy results revealed adenocarcinoma. Pt was discharged on 06/06/17.    06/04/2017 Initial Biopsy    Diagnosis 06/04/17 Stomach, biopsy, Fundus - ADENOCARCINOMA - SEE COMMENT Microscopic Comment The stomach biopsy is of an adenocarcinoma arising in a background of intestinal metaplasia. A Warthin-Starry stain is performed to determine the possibility of the presence of Helicobacter pylori. The Warthin-Starry stain is negative for organisms morphologically consistent with Helicobacter pylori.    06/04/2017 Procedure    Esophagogastroduodenoscopy 06/04/17 by Dr. Benson Norway Impression: Normal esophagus. Malignant gastric tumor in the cardia and in the  gastric fundus. Biopsied. Normal examined duodenum.    06/04/2017 Miscellaneous    HER2 (-) EBV (-) MSI-S PD-L1 CPS 5%    06/05/2017 Imaging    CT CAP W Contrast 06/05/17 IMPRESSION: 9.1 cm fungating mass in the gastric cardia/posterior gastric fundus, corresponding to the patient's newly diagnosed gastric Cancer. Associated extra gastric extension with peritoneal disease in the left upper abdomen, including a dominant 4.2 cm peritoneal implant. Small volume pelvic ascites. 1.8 cm hypoenhancing lesion along the inferior spleen, indeterminate.    06/10/2017 Initial  Diagnosis    Gastric cancer ([place])    06/26/2017 -  Chemotherapy    First line chemo FOLFOX every 2 weeks      08/29/2017 Imaging    IMPRESSION: 1. Interval decrease in size of fungating ulcerative mass arising from the posterior gastric fundus. 2. Left peritoneal implant, compatible with peritoneal carcinomatosis, has decreased in size from the previous exam. 3. Right lobe of liver lesion present on previous exam is less conspicuous on today's study. A new lesion is identified within the left lobe of liver, suspicious for metastasis. Additional focus of low attenuation within segment 5 adjacent to the gallbladder fossa may represent focal fatty deposition. 4. Similar appearance of mildly complex cyst containing mural calcification and possible internal septation arising from the inferior pole of right kidney. More definitive characterization of this lesion with renal protocol MRI may be helpful. 5.  Mild prostate gland enlargement. 6.  Aortic Atherosclerosis (ICD10-I70.0).    11/25/2017 Imaging    11/25/2017 CT AP W Contrast IMPRESSION: 1. Mixed appearance, with the gastric fundal mass thicker than it was on 08/29/2017 (although still improved compared to 06/05/2017), but with reduced local conglomerate adenopathy along the splenic hilum compared to 08/29/2017. 2. At this time I do not see any definite hepatic metastatic lesions. Several lesions of suspicion in the left hepatic lobe and inferiorly in the right hepatic lobe are not readily visible today. There is a small focus of hypodensity adjacent to the gallbladder fossa in segment 7 which is technically nonspecific but which could reflect focal fatty infiltration. 3. Increase in nodular contour of scarring in the left lower lobe. This is probably incidental but I cannot completely exclude the possibility of a  localized metastatic lesion. Surveillance of the left lower lobe suggested. 4. Bosniak category 75F cyst of the right kidney  lower pole with internal septation and calcification. Surveillance of this lesion is suggested; I suspect that the patient's cancer surveillance will supersede the typical recommended surveillance schedule for complex cyst. 5. Other imaging findings of potential clinical significance: Aortic Atherosclerosis (ICD10-I70.0). Degenerative glenohumeral arthropathy bilaterally. Mild impingement at L4-5 and L5-S1.   CURRENT THERAPY: first line chemo FOLFOX every 2 weeks, started on 06/26/2017  INTERVAL HISTORY: Mr. Davenport returns for follow up as scheduled. He completed cycle 14 FOLFOX on 8/15. He saw Dr. Barry Dienes last week who plans to perform surgery, but date is not set. His appetite is low for 1 week following treatment, but then rebounds. Denies n/v/c/d. Takes anti-emetics sparingly. Has occasional stomach pain that is managed with extra strength tylenol, he takes once per day. Has mild neuropathy in fingertips, not limiting function. He denies fever, chills, cough, chest pain, dyspnea, leg swelling, or mucositis.    MEDICAL HISTORY:  Past Medical History:  Diagnosis Date  . DVT (deep venous thrombosis) ([place]) 2011  . GI bleeding 2001  . History of DVT of lower extremity   . Melena 05/2017  . Stomach ulcer    Clinically suspected; No EGD confirmation as of 06/03/17    SURGICAL HISTORY: Past Surgical History:  Procedure Laterality Date  . ESOPHAGOGASTRODUODENOSCOPY  06/04/2017  . ESOPHAGOGASTRODUODENOSCOPY N/A 06/04/2017   Procedure: ESOPHAGOGASTRODUODENOSCOPY (EGD);  Surgeon: Carol Ada, MD;  Location: [facility];  Service: Endoscopy;  Laterality: N/A;  . IR FLUORO GUIDE PORT INSERTION RIGHT  06/20/2017  . IR US GUIDE VASC ACCESS RIGHT  06/20/2017    I have reviewed the social history and family history with the patient and they are unchanged from previous note.  ALLERGIES:  has No Known Allergies.  MEDICATIONS:  Current Outpatient Medications  Medication Sig Dispense Refill  .  acetaminophen (TYLENOL) 500 MG tablet Take 1,000 mg by mouth every 6 (six) hours as needed for mild pain.     Marland Kitchen amLODipine (NORVASC) 5 MG tablet Take 1 tablet (5 mg total) by mouth daily. 30 tablet 11  . mirtazapine (REMERON) 7.5 MG tablet Take 1 tablet (7.5 mg total) by mouth at bedtime. 30 tablet 1  . ondansetron (ZOFRAN) 8 MG tablet Take 8 mg by mouth 2 (two) times daily as needed for refractory nausea / vomiting. Start on day 3 after chemo    . pantoprazole (PROTONIX) 40 MG tablet Take 1 tablet (40 mg total) by mouth 2 (two) times daily. 60 tablet 2  . prochlorperazine (COMPAZINE) 10 MG tablet Take 1 tablet (10 mg total) by mouth every 6 (six) hours as needed for nausea or vomiting. 30 tablet 3  . vitamin B-12 (CYANOCOBALAMIN) 1000 MCG tablet Take 1 tablet (1,000 mcg total) by mouth daily. 30 tablet 0  . Blood Pressure Monitoring (BLOOD PRESSURE MONITOR/M CUFF) MISC 1 each by Does not apply route daily. 1 each 0  . lidocaine-prilocaine (EMLA) cream Apply 1 application topically as needed (for port).    . thiamine 100 MG tablet Take 1 tablet (100 mg total) by mouth daily. 30 tablet 3   No current facility-administered medications for this visit.     PHYSICAL EXAMINATION: ECOG PERFORMANCE STATUS: 1 - Symptomatic but completely ambulatory  Vitals:   01/09/18 1058  BP: 99/72  Pulse: 85  Resp: 18  Temp: 98.5 F (36.9 C)  SpO2: 100%   Filed Weights  01/09/18 1058  Weight: 157 lb 6.4 oz (71.4 kg)    GENERAL:alert, no distress and comfortable SKIN: skin color, texture, turgor are normal, no rashes or significant lesions EYES: sclera clear OROPHARYNX:no thrush or ulcers LYMPH:  no palpable cervical or supraclavicular lymphadenopathy LUNGS: clear to auscultation with normal breathing effort HEART: regular rate & rhythm, no lower extremity edema ABDOMEN:abdomen soft, non-tender and normal bowel sounds NEURO: alert & oriented x 3 with fluent speech, no focal motor deficits. Mildly  decreased vibratory sense to fingertips per tuning fork exam  PAC without erythema   LABORATORY DATA:  I have reviewed the data as listed CBC Latest Ref Rng & Units 01/09/2018 12/26/2017 12/11/2017  WBC 4.0 - 10.3 K/uL 4.9 4.1 5.1  Hemoglobin 13.0 - 17.1 g/dL 12.3(L) 11.9(L) 12.6(L)  Hematocrit 38.4 - 49.9 % 36.8(L) 36.2(L) 38.1(L)  Platelets 140 - 400 K/uL 232 193 191     CMP Latest Ref Rng & Units 01/09/2018 12/26/2017 12/11/2017  Glucose 70 - 99 mg/dL 94 94 115(H)  BUN 6 - 20 mg/dL _0 Creatinine 0.61 - 1.24 mg/dL 0.91 0.79 0.88  Sodium 135 - 145 mmol/L 134(L) 138 136  Potassium 3.5 - 5.1 mmol/L 4.4 4.0 3.9  Chloride 98 - 111 mmol/L 102 104 105  CO2 22 - 32 mmol/L _1 Calcium 8.9 - 10.3 mg/dL 9.3 9.0 9.3  Total Protein 6.5 - 8.1 g/dL 7.0 6.8 7.1  Total Bilirubin 0.3 - 1.2 mg/dL 0.6 0.6 0.4  Alkaline Phos 38 - 126 U/L 62 55 58  AST 15 - 41 U/L _2 ALT 0 - 44 U/L _3 CEA 06/13/17: 53.01 07/10/17: 106.44 08/07/17: 31.11 09/04/17: 11.02 10/02/17: 5.64 10/30/17: 4.31 11/27/17: 7.56 12/26/17: 9.46  PATHOLOGY  Diagnosis 06/04/17 Stomach, biopsy, Fundus - ADENOCARCINOMA - SEE COMMENT Microscopic Comment The stomach biopsy is of an adenocarcinoma arising in a background of intestinal metaplasia. A Warthin-Starry stain is performed to determine the possibility of the presence of Helicobacter pylori. The Warthin-Starry stain is negative for organisms morphologically consistent with Helicobacter pylori.   RADIOGRAPHIC STUDIES: I have personally reviewed the radiological images as listed and agreed with the findings in the report. No results found.   ASSESSMENT & PLAN: Issacc Merlo 57 y.o. male, without significant past medical history, but also does not  see doctors routinely, presented with worsening fatigue, intermittent epigastric pain and melena for 3-4 months, and 50 pounds weight loss over the past 6 months.  1.  Gastric cancer, T4bNxM1 with peritoneal  metastasis, adenocarcinoma, HER2(-), MSI-stable, PD-L1positive (5%) 2. Anemia of iron deficiency and tumor bleeding, B12 deficiency  3. History of DVT ? Protein C deficiency  4. History of alcohol and marijuana abuse 5. Weight loss and malnutrition  6. Goal of care discussion  7. Right inguinal hernia 8. Peripheral neuropathy, G1, secondary to chemotherapy oxaliplatin   Mr. Leinbach appears unchanged. He completed 14 cycles FOLFOX. He is tolerating treatment well overall. He has mild neuropathy, otherwise no significant side effects. Labs reviewed, CBC and CMP adequate to proceed with next cycle. Dr. Barry Dienes is planning diagnostic lap and total gastrectomy and J tube, but date is not set yet. We will plan to continue chemo until surgical date is set. He will proceed with cycle 15 FOLFOX today and return in 2 weeks.   PLAN: -Labs reviewed, proceed with cycle 15 FOLFOX -Continue q2 weeks until surgical date is known -Return in 2 weeks for f/u  and next cycle   All questions were answered. The patient knows to call the clinic with any problems, questions or concerns. No barriers to learning was detected.     Alla Feeling, NP 01/09/18

## 2018-01-09 NOTE — Patient Instructions (Signed)
[facility] Discharge Instructions for Patients Receiving Chemotherapy  Today you received the following chemotherapy agents: Oxaliplatin, leucovorin, 5FU   To help prevent nausea and vomiting after your treatment, we encourage you to take your nausea medication as directed.    If you develop nausea and vomiting that is not controlled by your nausea medication, call the clinic.   BELOW ARE SYMPTOMS THAT SHOULD BE REPORTED IMMEDIATELY:  *FEVER GREATER THAN 100.5 F  *CHILLS WITH OR WITHOUT FEVER  NAUSEA AND VOMITING THAT IS NOT CONTROLLED WITH YOUR NAUSEA MEDICATION  *UNUSUAL SHORTNESS OF BREATH  *UNUSUAL BRUISING OR BLEEDING  TENDERNESS IN MOUTH AND THROAT WITH OR WITHOUT PRESENCE OF ULCERS  *URINARY PROBLEMS  *BOWEL PROBLEMS  UNUSUAL RASH Items with * indicate a potential emergency and should be followed up as soon as possible.  Feel free to call the clinic should you have any questions or concerns. The clinic phone number is [phone].  Please show the CHEMO ALERT CARD at check-in to the Emergency Department and triage nurse.   

## 2018-01-11 DIAGNOSIS — Z5111 Encounter for antineoplastic chemotherapy: Secondary | ICD-10-CM | POA: Diagnosis not present

## 2018-01-11 MED ORDER — SODIUM CHLORIDE 0.9% FLUSH
10.0000 mL | INTRAVENOUS | Status: DC | PRN
  Administered 2018-01-11: 10 mL

## 2018-01-11 MED ORDER — HEPARIN SOD (PORK) LOCK FLUSH 100 UNIT/ML IV SOLN
500.0000 [IU] | Freq: Once | INTRAVENOUS | Status: AC | PRN
  Administered 2018-01-11: 500 [IU]

## 2018-01-11 NOTE — Addendum Note (Signed)
Addended by: Lenox Ponds E on: 01/11/2018 12:27 PM   Modules accepted: Orders

## 2018-01-16 NOTE — Progress Notes (Signed)
[place]  [phone] Fax:([phone]) [phone]  Clinic Follow Up Note   Patient Care Team: Lanae Boast, FNP as PCP - General (Family Medicine) 01/22/2018  CHIEF COMPLAINTS:  Follow up gastric Cancer  Oncology History   Cancer Staging Gastric cancer [facility]) Staging form: Stomach, AJCC 8th Edition - Clinical stage from 06/04/2017: Stage IVB (cT4, cN0, cM1) - Signed by [name], MD on 06/10/2017       Gastric cancer ([place])   06/03/2017 - 06/06/2017 Hospital Admission    Admitted to the hospital on 06/03/17 with complaints of fatigue and melena. During his stay he underwent an endoscopy that revealed a malignant gastric tumor in the cardia and in the gastric fundus. Biopsy results revealed adenocarcinoma. Pt was discharged on 06/06/17.    06/04/2017 Initial Biopsy    Diagnosis 06/04/17 Stomach, biopsy, Fundus - ADENOCARCINOMA - SEE COMMENT Microscopic Comment The stomach biopsy is of an adenocarcinoma arising in a background of intestinal metaplasia. A Warthin-Starry stain is performed to determine the possibility of the presence of Helicobacter pylori. The Warthin-Starry stain is negative for organisms morphologically consistent with Helicobacter pylori.    06/04/2017 Procedure    Esophagogastroduodenoscopy 06/04/17 by Dr. Benson Norway Impression: Normal esophagus. Malignant gastric tumor in the cardia and in the  gastric fundus. Biopsied. Normal examined duodenum.    06/04/2017 Miscellaneous    HER2 (-) EBV (-) MSI-S PD-L1 CPS 5%    06/05/2017 Imaging    CT CAP W Contrast 06/05/17 IMPRESSION: 9.1 cm fungating mass in the gastric cardia/posterior gastric fundus, corresponding to the patient's newly diagnosed gastric Cancer. Associated extra gastric extension with peritoneal disease in the left upper abdomen, including a dominant 4.2 cm peritoneal implant. Small volume pelvic ascites. 1.8 cm hypoenhancing lesion along the inferior spleen, indeterminate.    06/10/2017 Initial Diagnosis    Gastric cancer ([place])    06/26/2017 -  Chemotherapy    First line chemo FOLFOX every 2 weeks      08/29/2017 Imaging    IMPRESSION: 1. Interval decrease in size of fungating ulcerative mass arising from the posterior gastric fundus. 2. Left peritoneal implant, compatible with peritoneal carcinomatosis, has decreased in size from the previous exam. 3. Right lobe of liver lesion present on previous exam is less conspicuous on today's study. A new lesion is identified within the left lobe of liver, suspicious for metastasis. Additional focus of low attenuation within segment 5 adjacent to the gallbladder fossa may represent focal fatty deposition. 4. Similar appearance of mildly complex cyst containing mural calcification and possible internal septation arising from the inferior pole of right kidney. More definitive characterization of this lesion with renal protocol MRI may be helpful. 5.  Mild prostate gland enlargement. 6.  Aortic Atherosclerosis (ICD10-I70.0).    11/25/2017 Imaging    11/25/2017 CT AP W Contrast IMPRESSION: 1. Mixed appearance, with the gastric fundal mass thicker than it was on 08/29/2017 (although still improved compared to 06/05/2017), but with reduced local conglomerate adenopathy along the splenic hilum compared to 08/29/2017. 2. At this time I do not see any definite hepatic metastatic lesions. Several lesions of suspicion in the left hepatic lobe and inferiorly in the right hepatic lobe are not readily visible today. There is a small focus of hypodensity adjacent to the gallbladder fossa in segment 7 which is technically nonspecific but which could reflect focal fatty infiltration. 3. Increase in nodular contour of scarring in the left lower lobe. This is probably incidental but I cannot completely exclude the  possibility of a localized metastatic lesion. Surveillance of the left lower lobe suggested. 4. Bosniak category 21F cyst of  the right kidney lower pole with internal septation and calcification. Surveillance of this lesion is suggested; I suspect that the patient's cancer surveillance will supersede the typical recommended surveillance schedule for complex cyst. 5. Other imaging findings of potential clinical significance: Aortic Atherosclerosis (ICD10-I70.0). Degenerative glenohumeral arthropathy bilaterally. Mild impingement at L4-5 and L5-S1.    12/23/2017 Imaging    12/23/2017 MRI Abdomen IMPRESSION: Ill-defined soft tissue mass in the gastric fundus shows no significant change, consistent known gastric adenocarcinoma.  No definite liver metastases identified. Hemosiderosis noted, with focal area of sparing seen in the right hepatic lobe which accounts for the lesions seen on recent CT.  Stable 1.9 cm probably benign Bosniak category 2 F cystic lesion in right kidney. Recommend continued attention on follow-up imaging.      HISTORY OF PRESENTING ILLNESS:  Ryan Collins 57 y.o. male is a here because of newly diagnosed gastric cancer. The patient was referred by hospitalist after her recent hospital discharge.  The patient presents to the clinic today by himself.   Pt went to urgent care on 05/15/17 with complaints of worsening fatigue, central abdominal pain, black stool for 4 months, and 50 lbs weight loss in the past 6 months. discomfort over the past few months. He described the pain as sharp and dull that is worse after he eats. The pain would last 30-40 minutes after eating. Pt was then seen in the ED and admitted to the hospital on 06/03/17 with complaints of fatigue and melena. During his stay he underwent an endoscopy that revealed a malignant gastric tumor in the cardia and in the gastric fundus. Biopsy of the mass revealed adenocarcinoma.  Patient received blood transfusion and 1 dose of IV Feraheme, tolerated well.  His fatigue has much improved.  Pt was dischared on 06/06/17.    Patient is  single, no children, lives with his brother in a trailer, he has multiple brothers and sisters and some of them live in the town.  Her mother also lives in [place].  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  He is concerned about his finances if he is not able to continue working.   CURRENT THERAPY: first line chemo FOLFOX every 2 weeks, started on 06/26/2017  INTERIM HISTORY:  Ryan Collins returns for follow-up. He is here alone at the clinic. He is doing well and denies new complaints. He is gaining weight. He loses appetite with chemo but tries to eat well when he recovers. He states that his neuropathy is stable and he doesn't drop things and is able to walk without difficulties.      He saw Dr. Barry Dienes for his gastric cancer and he is willing to do surgery if needed.  MEDICAL HISTORY:  Past Medical History:  Diagnosis Date  . DVT (deep venous thrombosis) ([place]) 2011  . GI bleeding 2001  . History of DVT of lower extremity   . Melena 05/2017  . Stomach ulcer    Clinically suspected; No EGD confirmation as of 06/03/17    SURGICAL HISTORY: Past Surgical History:  Procedure Laterality Date  . ESOPHAGOGASTRODUODENOSCOPY  06/04/2017  . ESOPHAGOGASTRODUODENOSCOPY N/A 06/04/2017   Procedure: ESOPHAGOGASTRODUODENOSCOPY (EGD);  Surgeon: Carol Ada, MD;  Location: [facility];  Service: Endoscopy;  Laterality: N/A;  . IR FLUORO GUIDE PORT INSERTION RIGHT  06/20/2017  . IR US GUIDE VASC ACCESS RIGHT  06/20/2017  SOCIAL HISTORY: Social History   Socioeconomic History  . Marital status: Legally Separated    Spouse name: Not on file  . Number of children: Not on file  . Years of education: Not on file  . Highest education level: Not on file  Occupational History  . Not on file  Social Needs  . Financial resource strain: Not on file  . Food insecurity:    Worry: Not on file    Inability: Not on file  . Transportation needs:    Medical: Not on file     Non-medical: Not on file  Tobacco Use  . Smoking status: Never Smoker  . Smokeless tobacco: Never Used  Substance and Sexual Activity  . Alcohol use: Yes    Alcohol/week: 5.0 standard drinks    Types: 5 Cans of beer per week    Comment: 05/2017 i QUIT DRINKING 4 MONTHS AGO "  . Drug use: Yes    Types: Marijuana    Comment: occ  . Sexual activity: Not on file  Lifestyle  . Physical activity:    Days per week: Not on file    Minutes per session: Not on file  . Stress: Not on file  Relationships  . Social connections:    Talks on phone: Not on file    Gets together: Not on file    Attends religious service: Not on file    Active member of club or organization: Not on file    Attends meetings of clubs or organizations: Not on file    Relationship status: Not on file  . Intimate partner violence:    Fear of current or ex partner: Not on file    Emotionally abused: Not on file    Physically abused: Not on file    Forced sexual activity: Not on file  Other Topics Concern  . Not on file  Social History Narrative  . Not on file    FAMILY HISTORY: Family History  Problem Relation Age of Onset  . Hypertension Mother   . Cancer Sister 21       breast    ALLERGIES:  has No Known Allergies.  MEDICATIONS:  Current Outpatient Medications  Medication Sig Dispense Refill  . acetaminophen (TYLENOL) 500 MG tablet Take 1,000 mg by mouth every 6 (six) hours as needed for mild pain.     Marland Kitchen amLODipine (NORVASC) 5 MG tablet Take 1 tablet (5 mg total) by mouth daily. 30 tablet 11  . Blood Pressure Monitoring (BLOOD PRESSURE MONITOR/M CUFF) MISC 1 each by Does not apply route daily. 1 each 0  . lidocaine-prilocaine (EMLA) cream Apply 1 application topically as needed (for port).    . mirtazapine (REMERON) 7.5 MG tablet Take 1 tablet (7.5 mg total) by mouth at bedtime. 30 tablet 1  . ondansetron (ZOFRAN) 8 MG tablet Take 8 mg by mouth 2 (two) times daily as needed for refractory nausea /  vomiting. Start on day 3 after chemo    . pantoprazole (PROTONIX) 40 MG tablet Take 1 tablet (40 mg total) by mouth 2 (two) times daily. 60 tablet 2  . prochlorperazine (COMPAZINE) 10 MG tablet Take 1 tablet (10 mg total) by mouth every 6 (six) hours as needed for nausea or vomiting. 30 tablet 3  . thiamine 100 MG tablet Take 1 tablet (100 mg total) by mouth daily. 30 tablet 3  . vitamin B-12 (CYANOCOBALAMIN) 1000 MCG tablet Take 1 tablet (1,000 mcg total) by mouth daily. 30 tablet  0   No current facility-administered medications for this visit.    Facility-Administered Medications Ordered in Other Visits  Medication Dose Route Frequency Provider Last Rate Last Dose  . dextrose 5 % solution   Intravenous Continuous [name], MD 20 mL/hr at 01/22/18 1010    . fluorouracil (ADRUCIL) 4,700 mg in sodium chloride 0.9 % 56 mL chemo infusion  2,400 mg/m2 (Treatment Plan Recorded) Intravenous 1 day or 1 dose [name], MD      . fluorouracil (ADRUCIL) chemo injection 800 mg  400 mg/m2 (Treatment Plan Recorded) Intravenous Once [name], MD      . leucovorin 780 mg in dextrose 5 % 250 mL infusion  400 mg/m2 (Treatment Plan Recorded) Intravenous Once [name], MD 145 mL/hr at 01/22/18 1111 780 mg at 01/22/18 1111  . sodium chloride flush (NS) 0.9 % injection 10 mL  10 mL Intracatheter PRN [name], MD   10 mL at 01/11/18 1222    REVIEW OF SYSTEMS:  Constitutional: Denies fevers, chills or abnormal night sweats, (+) weight gain Eyes: Denies blurriness of vision, double vision or watery eyes Ears, nose, mouth, throat, and face: Denies mucositis or sore throat Respiratory: Denies cough, dyspnea or wheezes Cardiovascular: Denies palpitation, chest discomfort or lower extremity swelling Gastrointestinal:  Denies nausea, heartburn (+) intermittent epigastric pain (+) right inguinal hernia (+) left lower abdominal tingling Skin: Denies abnormal skin rashes Lymphatics: Denies new lymphadenopathy or easy  bruising Neurological: Denies weakness (+) numbness and tingling in all extremities, stable Behavioral/Psych: Mood is stable, no new changes  All other systems were reviewed with the patient and are negative.  PHYSICAL EXAMINATION:  ECOG PERFORMANCE STATUS: 1 - Symptomatic but completely ambulatory   Today's Vitals   01/22/18 0936  BP: 98/73  Pulse: 82  Resp: 17  Temp: 98.7 F (37.1 C)  TempSrc: Oral  SpO2: 100%  Weight: 163 lb 14.4 oz (74.3 kg)  Height: 5' 11.5" (1.816 m)   GENERAL:alert, no distress and comfortable SKIN: skin color, texture, turgor are normal, no rashes or significant lesions EYES: normal, conjunctiva are pink and non-injected, sclera clear OROPHARYNX:no exudate, no erythema and lips, buccal mucosa, and tongue normal  NECK: supple, thyroid normal size, non-tender, without nodularity LYMPH:  no palpable lymphadenopathy in the cervical, axillary or inguinal LUNGS: clear to auscultation and percussion with normal breathing effort HEART: regular rate & rhythm and no murmurs and no lower extremity edema ABDOMEN:abdomen soft, non-tender and normal bowel sounds (+) right inguinal hernia Musculoskeletal:no cyanosis of digits and no clubbing  PSYCH: alert & oriented x 3 with fluent speech NEURO: no focal motor/sensory deficits  LABORATORY DATA:  I have reviewed the data as listed CBC Latest Ref Rng & Units 01/22/2018 01/09/2018 12/26/2017  WBC 4.0 - 10.3 K/uL 6.6 4.9 4.1  Hemoglobin 13.0 - 17.1 g/dL 11.3(L) 12.3(L) 11.9(L)  Hematocrit 38.4 - 49.9 % 33.7(L) 36.8(L) 36.2(L)  Platelets 140 - 400 K/uL 182 232 193    CMP Latest Ref Rng & Units 01/22/2018 01/09/2018 12/26/2017  Glucose 70 - 99 mg/dL 100(H) 94 94  BUN 6 - 20 mg/dL 9 15 8   Creatinine 0.61 - 1.24 mg/dL 0.75 0.91 0.79  Sodium 135 - 145 mmol/L 139 134(L) 138  Potassium 3.5 - 5.1 mmol/L 4.2 4.4 4.0  Chloride 98 - 111 mmol/L 107 102 104  CO2 22 - 32 mmol/L 25 24 25   Calcium 8.9 - 10.3 mg/dL 9.1 9.3 9.0    Total Protein 6.5 - 8.1 g/dL  6.4(L) 7.0 6.8  Total Bilirubin 0.3 - 1.2 mg/dL 0.5 0.6 0.6  Alkaline Phos 38 - 126 U/L 59 62 55  AST 15 - 41 U/L 20 21 24   ALT 0 - 44 U/L 11 13 17    CEA 06/13/17: 53.01 07/10/17: 106.44 08/07/17: 31.11 09/04/17: 11.02 10/02/17: 5.64 10/30/17: 4.31 11/27/17: 7.56 12/26/17: 9.46  PATHOLOGY  Diagnosis 06/04/17 Stomach, biopsy, Fundus - ADENOCARCINOMA - SEE COMMENT Microscopic Comment The stomach biopsy is of an adenocarcinoma arising in a background of intestinal metaplasia. A Warthin-Starry stain is performed to determine the possibility of the presence of Helicobacter pylori. The Warthin-Starry stain is negative for organisms morphologically consistent with Helicobacter pylori.  RADIOGRAPHIC STUDIES: I have personally reviewed the radiological images as listed and agreed with the findings in the report.  12/23/2017 MRI Abdomen IMPRESSION: Ill-defined soft tissue mass in the gastric fundus shows no significant change, consistent known gastric adenocarcinoma.  No definite liver metastases identified. Hemosiderosis noted, with focal area of sparing seen in the right hepatic lobe which accounts for the lesions seen on recent CT.  Stable 1.9 cm probably benign Bosniak category 2 F cystic lesion in right kidney. Recommend continued attention on follow-up imaging.  11/25/2017 CT CAP W Contrast IMPRESSION: 1. Mixed appearance, with the gastric fundal mass thicker than it was on 08/29/2017 (although still improved compared to 06/05/2017), but with reduced local conglomerate adenopathy along the splenic hilum compared to 08/29/2017. 2. At this time I do not see any definite hepatic metastatic lesions. Several lesions of suspicion in the left hepatic lobe and inferiorly in the right hepatic lobe are not readily visible today. There is a small focus of hypodensity adjacent to the gallbladder fossa in segment 7 which is technically nonspecific but which  could reflect focal fatty infiltration. 3. Increase in nodular contour of scarring in the left lower lobe. This is probably incidental but I cannot completely exclude the possibility of a localized metastatic lesion. Surveillance of the left lower lobe suggested. 4. Bosniak category 71F cyst of the right kidney lower pole with internal septation and calcification. Surveillance of this lesion is suggested; I suspect that the patient's cancer surveillance will supersede the typical recommended surveillance schedule for complex cyst. 5. Other imaging findings of potential clinical significance: Aortic Atherosclerosis (ICD10-I70.0). Degenerative glenohumeral arthropathy bilaterally. Mild impingement at L4-5 and L5-S1.  No results found. CT CAP W Contrast 06/05/17 IMPRESSION: 9.1 cm fungating mass in the gastric cardia/posterior gastric fundus, corresponding to the patient's newly diagnosed gastric Cancer. Associated extra gastric extension with peritoneal disease in the left upper abdomen, including a dominant 4.2 cm peritoneal implant. Small volume pelvic ascites. 1.8 cm hypoenhancing lesion along the inferior spleen, indeterminate.  PROCEDURE  Esophagogastroduodenoscopy 06/04/17 by Dr. Benson Norway Findings: The esophagus was normal. A large, fungating and ulcerated, non-circumferential mass with no bleeding and no stigmata of recent bleeding was found in the cardia and in the gastric fundus. Biopsies were taken with a cold forceps for histology. The examined duodenum was normal. A large friable fungating gastric mass involving the cardia and the fundus was identified. The mass extended approximately 5 cm. It was difficult to measure the width of the lesion. There was a large necrotic and deep ulcer in the mid portion of the mass. It is estimated that it encompasses 25% of the fundic circumfirence. Multiple cold biopsies were obtained. Impression: Normal esophagus. Malignant gastric tumor in the cardia  and in the  gastric fundus. Biopsied. Normal examined duodenum.  ASSESSMENT & PLAN:  Ryan Collins 57 y.o. male, without significant past medical history, but also does not  see doctors routinely, presented with worsening fatigue, intermittent epigastric pain and melena for 3-4 months, and 50 pounds weight loss over the past 6 months.  1.  Gastric cancer, T4bNxM1 with peritoneal metastasis, adenocarcinoma, HER2(-), MSI-stable, PD-L1positive (5%) -I previously reviewed his CT scan findings, endoscopy and biopsy results of the gastric mass with patient in details. He has a large mass in the cardia and in the gastric fundus, with evidence of extra gastric extension, and the peritoneum metastasis on CT scan.  No other distant metastasis. -Due to the peritoneal metastasis, unfortunately his cancer is not curable at this stage. We previously discussed the role of HIPEC if he has excellent response to chemotherapy, and no evidence of other metastasis, I may consider refer him to Dr. Lennie Odor in the future. Although due to the aggressive nature of gastric cancer, HIPEC is usually not offered.  -He started first-line chemotherapy with FOLFOX on 06/26/17, he has been tolerating well so far. -Goal of therapy is palliative. -His tumor is negative for HER-2, MSI-stable, PD-L1positive (5).  He would be a candidate for immunotherapy as third line -His tumor marker has been trending down, CEA 11.02 on 09/04/17.  -His CT CAP W Contrast from 08/29/17 revealed a good partial response except one indeterminate lesion in left lobe of liver. I have personally reviewed his recent restaging CT, and discussed with radiologist Dr. [name]. I agree with his interpretation. I recommended continuing chemo at that time. Pt agreed. -he has a right inguinal hernia onset in Jan 2019 and expressed interest in having it repaired. -He is clinically doing very well, his abdominal pain has near resolved, tolerating chemo treatment well, tumor  marker CEA has dropped to normal. -I personally reviewed his restaging CT scan from November 25, 2017 and explained to pt, which showed overall improvement compared to January 2019, but is a gastric fundal mass is slightly thicker than it was on April 2019.  No significant peritoneal disease or other metastasis, but there was a small focus of hypodensity adjacent to the gallbladder in the liver.  -I obtained a abdominal MRI on December 23, 2017, which showed no liver metastasis. -We previously discussed his case in GI tumor board, the previous peritoneal mass felt to be the extension of his gastric mass, no other extensive peritoneal metastasis seen on the scan. I previously referred to Dr. Barry Dienes.  Dr. Barry Dienes plan to do a exploratory laparoscope to rule out metastasis, if negative, will consider gastrostomy. -He developed fatigue and mild peripheral neuropathy, no impact on his functions, I previously reduced his oxaliplatin dose to 70 mg/m and plan to continue treatment with reduced dose.  Neuropathy has been stable overall. -Lab reviewed, CBC showed Hg 11.3 Hct 33.7. CMP and iron studies were pending adequate for treatment, will continue FOLFOX every 2 weeks. -We will likely stop after this cycle next cycle chemotherapy, to prepare him for the surgery.  2.  Anemia of iron deficiency and tumor bleeding, B12 deficiency -He has clinical GI bleeding with melena, iron study also showed iron deficiency. -He has received blood transfusion, and 2 dose of IV Feraheme, repeated iron level was adequate.   -We continue to monitor his CBC and iron studies closely -He will continue Oral-B complex   3. History of DVT, ?  Protein C deficiency -he was on coumadin for his DVT before, and previous labs showed a protein C deficiency, which increases his risk  of thrombosis. -We previously discussed the high risk of thrombosis secondary to his underlying malignancy. -Due to his chronic GI bleeding from his tumor, I would  not do prophylactic anticoagulation.  4. History of alcohol and marijuana abuse -He has quit drinking alcohol since his diagnosis of cancer -He does smoke marijuana occasionally  5. Weight loss and malnutrition  -He has lost significant weight since the diagnosis -Previously encouraged him to increase his nutrition supplement.  He has been seen by our dietitian, will follow-up next week. -weight is stable overall   6. Goal of care discussion  -We previously discussed the incurable nature of his cancer, and the overall poor prognosis, especially if he does not have good response to chemotherapy or progress on chemo -The patient understands the goal of care is palliative. -he is full code now   7.  Right inguinal hernia -He will discuss with Dr. Barry Dienes for consideration of right inguinal hernia repair.   8.  Peripheral neuropathy, grade 1, second to chemotherapy oxaliplatin -He has a mild numbness on his fingers, no impact on his hand function, no neuropathy on his feet -I will reduce his oxaliplatin dose, and watch his neuropathy closely.    PLAN: -Labs reviewed, proceed with cycle 14 FOLFOX today, continue q2 weeks with reduced oxaliplatin today -Depends on his date of surgery, may stop chemotherapy after this cycle or next cycle -We will likely consider repeating staging scan before his surgery -f/u in 2 weeks    All questions were answered. The patient knows to call the clinic with any problems, questions or concerns.  I spent 20 minutes counseling the patient face to face. The total time spent in the appointment was 25 minutes and more than 50% was on counseling.  Dierdre Searles Dweik am acting as scribe for Dr. [name].  I have reviewed the above documentation for accuracy and completeness, and I agree with the above.    [name], MD 01/22/2018 1:10 PM

## 2018-01-22 ENCOUNTER — Encounter: Payer: Self-pay | Admitting: Hematology

## 2018-01-22 ENCOUNTER — Inpatient Hospital Stay: Payer: Medicaid Other

## 2018-01-22 ENCOUNTER — Inpatient Hospital Stay: Payer: Medicaid Other | Attending: Hematology

## 2018-01-22 ENCOUNTER — Inpatient Hospital Stay (HOSPITAL_BASED_OUTPATIENT_CLINIC_OR_DEPARTMENT_OTHER): Payer: Medicaid Other | Admitting: Hematology

## 2018-01-22 VITALS — BP 98/73 | HR 82 | Temp 98.7°F | Resp 17 | Ht 71.5 in | Wt 163.9 lb

## 2018-01-22 DIAGNOSIS — I700 Atherosclerosis of aorta: Secondary | ICD-10-CM | POA: Insufficient documentation

## 2018-01-22 DIAGNOSIS — C161 Malignant neoplasm of fundus of stomach: Secondary | ICD-10-CM

## 2018-01-22 DIAGNOSIS — D500 Iron deficiency anemia secondary to blood loss (chronic): Secondary | ICD-10-CM | POA: Insufficient documentation

## 2018-01-22 DIAGNOSIS — F1010 Alcohol abuse, uncomplicated: Secondary | ICD-10-CM | POA: Diagnosis not present

## 2018-01-22 DIAGNOSIS — Z8719 Personal history of other diseases of the digestive system: Secondary | ICD-10-CM | POA: Insufficient documentation

## 2018-01-22 DIAGNOSIS — Z79899 Other long term (current) drug therapy: Secondary | ICD-10-CM

## 2018-01-22 DIAGNOSIS — Z5111 Encounter for antineoplastic chemotherapy: Secondary | ICD-10-CM | POA: Diagnosis not present

## 2018-01-22 DIAGNOSIS — G620 Drug-induced polyneuropathy: Secondary | ICD-10-CM

## 2018-01-22 DIAGNOSIS — T451X5S Adverse effect of antineoplastic and immunosuppressive drugs, sequela: Secondary | ICD-10-CM | POA: Insufficient documentation

## 2018-01-22 DIAGNOSIS — Z86718 Personal history of other venous thrombosis and embolism: Secondary | ICD-10-CM

## 2018-01-22 DIAGNOSIS — R630 Anorexia: Secondary | ICD-10-CM | POA: Insufficient documentation

## 2018-01-22 DIAGNOSIS — R634 Abnormal weight loss: Secondary | ICD-10-CM

## 2018-01-22 DIAGNOSIS — K4090 Unilateral inguinal hernia, without obstruction or gangrene, not specified as recurrent: Secondary | ICD-10-CM | POA: Diagnosis not present

## 2018-01-22 DIAGNOSIS — C786 Secondary malignant neoplasm of retroperitoneum and peritoneum: Secondary | ICD-10-CM | POA: Diagnosis not present

## 2018-01-22 DIAGNOSIS — E46 Unspecified protein-calorie malnutrition: Secondary | ICD-10-CM | POA: Diagnosis not present

## 2018-01-22 DIAGNOSIS — F1210 Cannabis abuse, uncomplicated: Secondary | ICD-10-CM | POA: Insufficient documentation

## 2018-01-22 DIAGNOSIS — E538 Deficiency of other specified B group vitamins: Secondary | ICD-10-CM | POA: Insufficient documentation

## 2018-01-22 DIAGNOSIS — K921 Melena: Secondary | ICD-10-CM | POA: Insufficient documentation

## 2018-01-22 DIAGNOSIS — N400 Enlarged prostate without lower urinary tract symptoms: Secondary | ICD-10-CM | POA: Insufficient documentation

## 2018-01-22 DIAGNOSIS — R5383 Other fatigue: Secondary | ICD-10-CM

## 2018-01-22 LAB — CBC WITH DIFFERENTIAL (CANCER CENTER ONLY)
BASOS ABS: 0 10*3/uL (ref 0.0–0.1)
Basophils Relative: 0 %
Eosinophils Absolute: 0 10*3/uL (ref 0.0–0.5)
Eosinophils Relative: 1 %
HEMATOCRIT: 33.7 % — AB (ref 38.4–49.9)
HEMOGLOBIN: 11.3 g/dL — AB (ref 13.0–17.1)
LYMPHS PCT: 32 %
Lymphs Abs: 2.1 10*3/uL (ref 0.9–3.3)
MCH: 29.5 pg (ref 27.2–33.4)
MCHC: 33.5 g/dL (ref 32.0–36.0)
MCV: 88 fL (ref 79.3–98.0)
Monocytes Absolute: 1 10*3/uL — ABNORMAL HIGH (ref 0.1–0.9)
Monocytes Relative: 16 %
NEUTROS ABS: 3.4 10*3/uL (ref 1.5–6.5)
NEUTROS PCT: 51 %
Platelet Count: 182 10*3/uL (ref 140–400)
RBC: 3.83 MIL/uL — AB (ref 4.20–5.82)
RDW: 17.5 % — ABNORMAL HIGH (ref 11.0–14.6)
WBC: 6.6 10*3/uL (ref 4.0–10.3)

## 2018-01-22 LAB — CEA (IN HOUSE-CHCC): CEA (CHCC-In House): 15.75 ng/mL — ABNORMAL HIGH (ref 0.00–5.00)

## 2018-01-22 LAB — IRON AND TIBC
IRON: 60 ug/dL (ref 42–163)
SATURATION RATIOS: 25 % — AB (ref 42–163)
TIBC: 239 ug/dL (ref 202–409)
UIBC: 179 ug/dL

## 2018-01-22 LAB — CMP (CANCER CENTER ONLY)
ALBUMIN: 3.3 g/dL — AB (ref 3.5–5.0)
ALT: 11 U/L (ref 0–44)
ANION GAP: 7 (ref 5–15)
AST: 20 U/L (ref 15–41)
Alkaline Phosphatase: 59 U/L (ref 38–126)
BUN: 9 mg/dL (ref 6–20)
CO2: 25 mmol/L (ref 22–32)
Calcium: 9.1 mg/dL (ref 8.9–10.3)
Chloride: 107 mmol/L (ref 98–111)
Creatinine: 0.75 mg/dL (ref 0.61–1.24)
GLUCOSE: 100 mg/dL — AB (ref 70–99)
POTASSIUM: 4.2 mmol/L (ref 3.5–5.1)
Sodium: 139 mmol/L (ref 135–145)
TOTAL PROTEIN: 6.4 g/dL — AB (ref 6.5–8.1)
Total Bilirubin: 0.5 mg/dL (ref 0.3–1.2)

## 2018-01-22 LAB — FERRITIN: FERRITIN: 338 ng/mL — AB (ref 24–336)

## 2018-01-22 MED ORDER — FLUOROURACIL CHEMO INJECTION 2.5 GM/50ML
400.0000 mg/m2 | Freq: Once | INTRAVENOUS | Status: AC
  Administered 2018-01-22: 800 mg via INTRAVENOUS
  Filled 2018-01-22: qty 16

## 2018-01-22 MED ORDER — DEXAMETHASONE SODIUM PHOSPHATE 10 MG/ML IJ SOLN
INTRAMUSCULAR | Status: AC
  Filled 2018-01-22: qty 1

## 2018-01-22 MED ORDER — SODIUM CHLORIDE 0.9 % IV SOLN
2400.0000 mg/m2 | INTRAVENOUS | Status: DC
  Administered 2018-01-22: 4700 mg via INTRAVENOUS
  Filled 2018-01-22: qty 94

## 2018-01-22 MED ORDER — PALONOSETRON HCL INJECTION 0.25 MG/5ML
INTRAVENOUS | Status: AC
  Filled 2018-01-22: qty 5

## 2018-01-22 MED ORDER — DEXTROSE 5 % IV SOLN
INTRAVENOUS | Status: DC
  Administered 2018-01-22: 10:00:00 via INTRAVENOUS
  Filled 2018-01-22: qty 250

## 2018-01-22 MED ORDER — OXALIPLATIN CHEMO INJECTION 100 MG/20ML
70.0000 mg/m2 | Freq: Once | INTRAVENOUS | Status: AC
  Administered 2018-01-22: 135 mg via INTRAVENOUS
  Filled 2018-01-22: qty 27

## 2018-01-22 MED ORDER — PALONOSETRON HCL INJECTION 0.25 MG/5ML
0.2500 mg | Freq: Once | INTRAVENOUS | Status: AC
  Administered 2018-01-22: 0.25 mg via INTRAVENOUS

## 2018-01-22 MED ORDER — DEXAMETHASONE SODIUM PHOSPHATE 10 MG/ML IJ SOLN
10.0000 mg | Freq: Once | INTRAMUSCULAR | Status: AC
  Administered 2018-01-22: 10 mg via INTRAVENOUS

## 2018-01-22 MED ORDER — SODIUM CHLORIDE 0.9% FLUSH
10.0000 mL | INTRAVENOUS | Status: DC | PRN
  Administered 2018-01-22: 10 mL via INTRAVENOUS
  Filled 2018-01-22: qty 10

## 2018-01-22 MED ORDER — LEUCOVORIN CALCIUM INJECTION 350 MG
400.0000 mg/m2 | Freq: Once | INTRAVENOUS | Status: AC
  Administered 2018-01-22: 780 mg via INTRAVENOUS
  Filled 2018-01-22: qty 39

## 2018-01-22 MED ORDER — DEXTROSE 5 % IV SOLN
Freq: Once | INTRAVENOUS | Status: AC
  Administered 2018-01-22: 10:00:00 via INTRAVENOUS
  Filled 2018-01-22: qty 250

## 2018-01-22 NOTE — Patient Instructions (Addendum)
[place] Discharge Instructions for Patients Receiving Chemotherapy  Today you received the following chemotherapy agents: Oxaliplatin (Eloxatin), Leucovorin, and Fluorouracil (Adrucil, 5-FU)  To help prevent nausea and vomiting after your treatment, we encourage you to take your nausea medication as directed. Received Aloxi during treatment today-->Take Compazine (not Zofran) for the next 3 days as needed.    If you develop nausea and vomiting that is not controlled by your nausea medication, call the clinic.   BELOW ARE SYMPTOMS THAT SHOULD BE REPORTED IMMEDIATELY:  *FEVER GREATER THAN 100.5 F  *CHILLS WITH OR WITHOUT FEVER  NAUSEA AND VOMITING THAT IS NOT CONTROLLED WITH YOUR NAUSEA MEDICATION  *UNUSUAL SHORTNESS OF BREATH  *UNUSUAL BRUISING OR BLEEDING  TENDERNESS IN MOUTH AND THROAT WITH OR WITHOUT PRESENCE OF ULCERS  *URINARY PROBLEMS  *BOWEL PROBLEMS  UNUSUAL RASH Items with * indicate a potential emergency and should be followed up as soon as possible.  Feel free to call the clinic should you have any questions or concerns. The clinic phone number is [phone].  Please show the [place] at check-in to the Emergency Department and triage nurse.

## 2018-01-23 ENCOUNTER — Telehealth: Payer: Self-pay

## 2018-01-23 NOTE — Telephone Encounter (Signed)
Per 9/11 no los °

## 2018-01-24 ENCOUNTER — Inpatient Hospital Stay: Payer: Medicaid Other

## 2018-01-24 VITALS — BP 102/68 | HR 89 | Temp 98.7°F | Resp 18

## 2018-01-24 DIAGNOSIS — C161 Malignant neoplasm of fundus of stomach: Secondary | ICD-10-CM

## 2018-01-24 MED ORDER — SODIUM CHLORIDE 0.9% FLUSH
10.0000 mL | INTRAVENOUS | Status: DC | PRN
  Administered 2018-01-24: 10 mL
  Filled 2018-01-24: qty 10

## 2018-01-24 MED ORDER — HEPARIN SOD (PORK) LOCK FLUSH 100 UNIT/ML IV SOLN
500.0000 [IU] | Freq: Once | INTRAVENOUS | Status: AC | PRN
  Administered 2018-01-24: 500 [IU]
  Filled 2018-01-24: qty 5

## 2018-02-04 NOTE — Progress Notes (Signed)
[facility]  [phone] [phone]) [phone]  Clinic Follow up Note   Patient Care Team: Ryan Boast, FNP as PCP - General (Family Medicine) 02/05/2018  SUMMARY OF ONCOLOGIC HISTORY: Oncology History   Cancer Staging Gastric cancer [facility]) Staging form: Stomach, AJCC 8th Edition - Clinical stage from 06/04/2017: Stage IVB (cT4, cN0, cM1) - Signed by Ryan Merle, MD on 06/10/2017       Gastric cancer ([place])   06/03/2017 - 06/06/2017 Hospital Admission    Admitted to the hospital on 06/03/17 with complaints of fatigue and melena. During his stay he underwent an endoscopy that revealed a malignant gastric tumor in the cardia and in the gastric fundus. Biopsy results revealed adenocarcinoma. Pt was discharged on 06/06/17.    06/04/2017 Initial Biopsy    Diagnosis 06/04/17 Stomach, biopsy, Fundus - ADENOCARCINOMA - SEE COMMENT Microscopic Comment The stomach biopsy is of an adenocarcinoma arising in a background of intestinal metaplasia. A Warthin-Starry stain is performed to determine the possibility of the presence of Helicobacter pylori. The Warthin-Starry stain is negative for organisms morphologically consistent with Helicobacter pylori.    06/04/2017 Procedure    Esophagogastroduodenoscopy 06/04/17 by Dr. Benson Collins Impression: Normal esophagus. Malignant gastric tumor in the cardia and in the  gastric fundus. Biopsied. Normal examined duodenum.    06/04/2017 Miscellaneous    HER2 (-) EBV (-) MSI-S PD-L1 CPS 5%    06/05/2017 Imaging    CT CAP W Contrast 06/05/17 IMPRESSION: 9.1 cm fungating mass in the gastric cardia/posterior gastric fundus, corresponding to the patient's newly diagnosed gastric Cancer. Associated extra gastric extension with peritoneal disease in the left upper abdomen, including a dominant 4.2 cm peritoneal implant. Small volume pelvic ascites. 1.8 cm hypoenhancing lesion along the inferior spleen, indeterminate.    06/10/2017 Initial  Diagnosis    Gastric cancer ([place])    06/26/2017 -  Chemotherapy    First line chemo FOLFOX every 2 weeks      08/29/2017 Imaging    IMPRESSION: 1. Interval decrease in size of fungating ulcerative mass arising from the posterior gastric fundus. 2. Left peritoneal implant, compatible with peritoneal carcinomatosis, has decreased in size from the previous exam. 3. Right lobe of liver lesion present on previous exam is less conspicuous on today's study. A new lesion is identified within the left lobe of liver, suspicious for metastasis. Additional focus of low attenuation within segment 5 adjacent to the gallbladder fossa may represent focal fatty deposition. 4. Similar appearance of mildly complex cyst containing mural calcification and possible internal septation arising from the inferior pole of right kidney. More definitive characterization of this lesion with renal protocol MRI may be helpful. 5.  Mild prostate gland enlargement. 6.  Aortic Atherosclerosis (ICD10-I70.0).    11/25/2017 Imaging    11/25/2017 CT AP W Contrast IMPRESSION: 1. Mixed appearance, with the gastric fundal mass thicker than it was on 08/29/2017 (although still improved compared to 06/05/2017), but with reduced local conglomerate adenopathy along the splenic hilum compared to 08/29/2017. 2. At this time I do not see any definite hepatic metastatic lesions. Several lesions of suspicion in the left hepatic lobe and inferiorly in the right hepatic lobe are not readily visible today. There is a small focus of hypodensity adjacent to the gallbladder fossa in segment 7 which is technically nonspecific but which could reflect focal fatty infiltration. 3. Increase in nodular contour of scarring in the left lower lobe. This is probably incidental but I cannot completely exclude the possibility of a  localized metastatic lesion. Surveillance of the left lower lobe suggested. 4. Bosniak category 16F cyst of the right kidney  lower pole with internal septation and calcification. Surveillance of this lesion is suggested; I suspect that the patient's cancer surveillance will supersede the typical recommended surveillance schedule for complex cyst. 5. Other imaging findings of potential clinical significance: Aortic Atherosclerosis (ICD10-I70.0). Degenerative glenohumeral arthropathy bilaterally. Mild impingement at L4-5 and L5-S1.    12/23/2017 Imaging    12/23/2017 MRI Abdomen IMPRESSION: Ill-defined soft tissue mass in the gastric fundus shows no significant change, consistent known gastric adenocarcinoma.  No definite liver metastases identified. Hemosiderosis noted, with focal area of sparing seen in the right hepatic lobe which accounts for the lesions seen on recent CT.  Stable 1.9 cm probably benign Bosniak category 2 F cystic lesion in right kidney. Recommend continued attention on follow-up imaging.   CURRENT THERAPY: first line chemo FOLFOX every 2 weeks, started on 06/26/2017, last dose 01/22/18 on hold pending surgery on 10/15  INTERVAL HISTORY: Ryan Collins returns for follow up as scheduled. He completed cycle 16 FOLFOX on 01/22/18. He had fatigue on day 3 with pump d/c. He notes his decreased appetite takes longer to recover after chemo. He does not feel mirtazapine 7.5 mg is very helpful. He drinks ensure sporadically when he has supply. Has infrequent intermittent LUQ abdominal pain, managed with tylenol PRN. Denies abdominal pain currently. Mild neuropathy has become constant, but still not limiting function. Has darkening to hands without redness or skin breakdown. Denies n/v/c/d or bleeding. No recent fever, chills, cough, chest pain, dyspnea, or leg edema.    MEDICAL HISTORY:  Past Medical History:  Diagnosis Date  . DVT (deep venous thrombosis) ([place]) 2011  . GI bleeding 2001  . History of DVT of lower extremity   . Melena 05/2017  . Stomach ulcer    Clinically suspected; No EGD  confirmation as of 06/03/17    SURGICAL HISTORY: Past Surgical History:  Procedure Laterality Date  . ESOPHAGOGASTRODUODENOSCOPY  06/04/2017  . ESOPHAGOGASTRODUODENOSCOPY N/A 06/04/2017   Procedure: ESOPHAGOGASTRODUODENOSCOPY (EGD);  Surgeon: Carol Ada, MD;  Location: [facility];  Service: Endoscopy;  Laterality: N/A;  . IR FLUORO GUIDE PORT INSERTION RIGHT  06/20/2017  . IR US GUIDE VASC ACCESS RIGHT  06/20/2017    I have reviewed the social history and family history with the patient and they are unchanged from previous note.  ALLERGIES:  has No Known Allergies.  MEDICATIONS:  Current Outpatient Medications  Medication Sig Dispense Refill  . acetaminophen (TYLENOL) 500 MG tablet Take 1,000 mg by mouth every 6 (six) hours as needed for mild pain.     Marland Kitchen amLODipine (NORVASC) 5 MG tablet Take 1 tablet (5 mg total) by mouth daily. 30 tablet 11  . Blood Pressure Monitoring (BLOOD PRESSURE MONITOR/M CUFF) MISC 1 each by Does not apply route daily. 1 each 0  . lidocaine-prilocaine (EMLA) cream Apply 1 application topically as needed (for port).    . mirtazapine (REMERON) 15 MG tablet Take 1 tablet (15 mg total) by mouth at bedtime. 30 tablet 1  . ondansetron (ZOFRAN) 8 MG tablet Take 8 mg by mouth 2 (two) times daily as needed for refractory nausea / vomiting. Start on day 3 after chemo    . pantoprazole (PROTONIX) 40 MG tablet Take 1 tablet (40 mg total) by mouth 2 (two) times daily. 60 tablet 2  . prochlorperazine (COMPAZINE) 10 MG tablet Take 1 tablet (10 mg total) by mouth every 6 (  six) hours as needed for nausea or vomiting. 30 tablet 3  . thiamine 100 MG tablet Take 1 tablet (100 mg total) by mouth daily. 30 tablet 3  . vitamin B-12 (CYANOCOBALAMIN) 1000 MCG tablet Take 1 tablet (1,000 mcg total) by mouth daily. 30 tablet 0   Current Facility-Administered Medications  Medication Dose Route Frequency Provider Last Rate Last Dose  . heparin lock flush 100 unit/mL  500 Units  Intracatheter Once PRN Cira Rue K, NP      . sodium chloride flush (NS) 0.9 % injection 10 mL  10 mL Intracatheter PRN Alla Feeling, NP   10 mL at 02/05/18 0845   Facility-Administered Medications Ordered in Other Visits  Medication Dose Route Frequency Provider Last Rate Last Dose  . sodium chloride flush (NS) 0.9 % injection 10 mL  10 mL Intracatheter PRN Ryan Merle, MD   10 mL at 01/11/18 1222    PHYSICAL EXAMINATION: ECOG PERFORMANCE STATUS: 1 - Symptomatic but completely ambulatory  Vitals:   02/05/18 0821  BP: 112/81  Pulse: 89  Resp: 16  Temp: 98.1 F (36.7 C)  SpO2: 100%   Filed Weights   02/05/18 0821  Weight: 154 lb 1.6 oz (69.9 kg)    GENERAL:alert, no distress and comfortable SKIN: skin color, texture, turgor are normal, no rashes or significant lesions. Mild hyperpigmentation to palms EYES: sclera clear OROPHARYNX:no thrush or ulcers LYMPH:  no palpable cervical or supraclavicular lymphadenopathy  LUNGS: clear to auscultation with normal breathing effort HEART: regular rate & rhythm; no lower extremity edema. RLE slightly larger due to remote DVT  ABDOMEN:abdomen soft, non-tender and normal bowel sounds Musculoskeletal:no cyanosis of digits and no clubbing  NEURO: alert & oriented x 3 with fluent speech, no focal motor deficits. Mildly decreased vibratory sense over fingertips per tuning fork exam PAC without erythema   LABORATORY DATA:  I have reviewed the data as listed CBC Latest Ref Rng & Units 02/05/2018 01/22/2018 01/09/2018  WBC 4.0 - 10.3 K/uL 9.3 6.6 4.9  Hemoglobin 13.0 - 17.1 g/dL 12.0(L) 11.3(L) 12.3(L)  Hematocrit 38.4 - 49.9 % 35.7(L) 33.7(L) 36.8(L)  Platelets 140 - 400 K/uL 257 182 232     CMP Latest Ref Rng & Units 02/05/2018 01/22/2018 01/09/2018  Glucose 70 - 99 mg/dL 77 100(H) 94  BUN 6 - 20 mg/dL _0 Creatinine 0.61 - 1.24 mg/dL 0.77 0.75 0.91  Sodium 135 - 145 mmol/L 138 139 134(L)  Potassium 3.5 - 5.1 mmol/L 3.9 4.2 4.4    Chloride 98 - 111 mmol/L 105 107 102  CO2 22 - 32 mmol/L _1 Calcium 8.9 - 10.3 mg/dL 9.2 9.1 9.3  Total Protein 6.5 - 8.1 g/dL 6.9 6.4(L) 7.0  Total Bilirubin 0.3 - 1.2 mg/dL 0.7 0.5 0.6  Alkaline Phos 38 - 126 U/L 72 59 62  AST 15 - 41 U/L _2 ALT 0 - 44 U/L _3 CEA 06/13/17: 53.01 07/10/17: 106.44 08/07/17: 31.11 09/04/17: 11.02 10/02/17: 5.64 10/30/17: 4.31 11/27/17: 7.56 12/26/17: 9.46 01/22/18: 15.75  PATHOLOGY  Diagnosis 06/04/17 Stomach, biopsy, Fundus - ADENOCARCINOMA - SEE COMMENT Microscopic Comment The stomach biopsy is of an adenocarcinoma arising in a background of intestinal metaplasia. A Warthin-Starry stain is performed to determine the possibility of the presence of Helicobacter pylori. The Warthin-Starry stain is negative for organisms morphologically consistent with Helicobacter pylori.   RADIOGRAPHIC STUDIES: I have personally reviewed the radiological images as listed and  agreed with the findings in the report. No results found.   ASSESSMENT & PLAN: Ryan Collins 57 y.o. male, without significant past medical history, but also does not  see doctors routinely, presented with worsening fatigue, intermittent epigastric pain and melena for 3-4 months, and 50 pounds weight loss over the past 6 months.  1.  Gastric cancer, T4bNxM1 with peritoneal metastasis, adenocarcinoma, HER2(-), MSI-stable, PD-L1positive (5%) -Ryan Collins appears stable. He completed 16 cycles FOLFOX, last dose 01/22/18. He has tolerated well overall, with mild fatigue, neuropathy, and weight loss recently. He has minimal pain, and remains active and functional.  -Dr. Barry Dienes is planning diagnostic laparoscopy and tumor resection on 10/15, will continue holding chemo pending surgery.  -CBC, CMP reviewed, overall stable. CEA trending up slightly to 15, although he is unchanged clinically. -will obtain restaging scans prior to surgery, I ordered CT CAP today. I reviewed the  plan with Dr. Barry Dienes.  -we discussed possible need for additional cancer treatment after surgery, depending on final surgical path.  -Plan to see him back after surgery, tentatively in 6 weeks   2.  Anemia of iron deficiency and tumor bleeding, B12 deficiency -s/p IV Feraheme in 05/2017 and 06/2017; not currently on oral iron  -Hgb 12.0 today, recent iron studies are stable  3. History of DVT, ?  Protein C deficiency -We previously discussed the high risk of thrombosis secondary to his underlying malignancy. -Due to his chronic GI bleeding from his tumor, Dr. Burr Medico did not recommend prophylactic anticoagulation. -will defer to Dr. Barry Dienes regarding peri/post operative anticoagulation  4. History of alcohol and marijuana abuse  5. Weight loss and malnutrition  -His weight fluctuates, down 9 lbs from last cycle. I increased mirtazapine to 15 mg po qHS, new Rx sent. I strongly encouraged him to increase ensure intake and gain weight before surgery, he agrees.   6. Goal of care discussion  -he is full code now   7.  Right inguinal hernia -He will discuss with Dr. Barry Dienes for consideration of right inguinal hernia repair.   8.  Peripheral neuropathy, grade 1, second to chemotherapy oxaliplatin -oxaliplatin was reduced to 70 mg/m2 from cycle 14 stable, no functional difficulties    PLAN: -Labs reviewed -Hold chemotherapy for pending surgery -CT CAP in 1 week, restaging before surgery  -Increased mirtazapine to 15 mg, new Rx sent -Discussed with Dr. Barry Dienes     Orders Placed This Encounter  Procedures  . CT Abdomen Pelvis W Contrast    Standing Status:   Future    Standing Expiration Date:   02/05/2019    Order Specific Question:   If indicated for the ordered procedure, I authorize the administration of contrast media per Radiology protocol    Answer:   Yes    Order Specific Question:   Preferred imaging location?    Answer:   [facility]    Order Specific Question:    Is Oral Contrast requested for this exam?    Answer:   Yes, Per Radiology protocol    Order Specific Question:   Radiology Contrast Protocol - do NOT remove file path    Answer:   \\[email or web address]  . CT Chest W Contrast    Standing Status:   Future    Standing Expiration Date:   02/05/2019    Order Specific Question:   If indicated for the ordered procedure, I authorize the administration of contrast media per Radiology protocol    Answer:   Yes  Order Specific Question:   Preferred imaging location?    Answer:   [facility]    Order Specific Question:   Radiology Contrast Protocol - do NOT remove file path    Answer:   \\[email or web address]   All questions were answered. The patient knows to call the clinic with any problems, questions or concerns. No barriers to learning was detected. I spent 20 minutes counseling the patient face to face. The total time spent in the appointment was 25 minutes and more than 50% was on counseling, review of test results, and coordination of care.      Alla Feeling, NP 02/05/18

## 2018-02-05 ENCOUNTER — Inpatient Hospital Stay: Payer: Medicaid Other

## 2018-02-05 ENCOUNTER — Telehealth: Payer: Self-pay

## 2018-02-05 ENCOUNTER — Encounter: Payer: Self-pay | Admitting: Nurse Practitioner

## 2018-02-05 ENCOUNTER — Inpatient Hospital Stay (HOSPITAL_BASED_OUTPATIENT_CLINIC_OR_DEPARTMENT_OTHER): Payer: Medicaid Other | Admitting: Nurse Practitioner

## 2018-02-05 VITALS — BP 112/81 | HR 89 | Temp 98.1°F | Resp 16 | Ht 71.5 in | Wt 154.1 lb

## 2018-02-05 DIAGNOSIS — K921 Melena: Secondary | ICD-10-CM

## 2018-02-05 DIAGNOSIS — I700 Atherosclerosis of aorta: Secondary | ICD-10-CM

## 2018-02-05 DIAGNOSIS — F1210 Cannabis abuse, uncomplicated: Secondary | ICD-10-CM

## 2018-02-05 DIAGNOSIS — C169 Malignant neoplasm of stomach, unspecified: Secondary | ICD-10-CM

## 2018-02-05 DIAGNOSIS — N400 Enlarged prostate without lower urinary tract symptoms: Secondary | ICD-10-CM

## 2018-02-05 DIAGNOSIS — D500 Iron deficiency anemia secondary to blood loss (chronic): Secondary | ICD-10-CM | POA: Diagnosis not present

## 2018-02-05 DIAGNOSIS — C161 Malignant neoplasm of fundus of stomach: Secondary | ICD-10-CM

## 2018-02-05 DIAGNOSIS — C786 Secondary malignant neoplasm of retroperitoneum and peritoneum: Secondary | ICD-10-CM

## 2018-02-05 DIAGNOSIS — Z79899 Other long term (current) drug therapy: Secondary | ICD-10-CM

## 2018-02-05 DIAGNOSIS — R630 Anorexia: Secondary | ICD-10-CM

## 2018-02-05 DIAGNOSIS — T451X5S Adverse effect of antineoplastic and immunosuppressive drugs, sequela: Secondary | ICD-10-CM

## 2018-02-05 DIAGNOSIS — G20 Parkinson's disease: Secondary | ICD-10-CM

## 2018-02-05 DIAGNOSIS — R5383 Other fatigue: Secondary | ICD-10-CM

## 2018-02-05 DIAGNOSIS — Z8719 Personal history of other diseases of the digestive system: Secondary | ICD-10-CM

## 2018-02-05 DIAGNOSIS — E46 Unspecified protein-calorie malnutrition: Secondary | ICD-10-CM

## 2018-02-05 DIAGNOSIS — Z86718 Personal history of other venous thrombosis and embolism: Secondary | ICD-10-CM

## 2018-02-05 DIAGNOSIS — E538 Deficiency of other specified B group vitamins: Secondary | ICD-10-CM

## 2018-02-05 DIAGNOSIS — K4090 Unilateral inguinal hernia, without obstruction or gangrene, not specified as recurrent: Secondary | ICD-10-CM

## 2018-02-05 DIAGNOSIS — R634 Abnormal weight loss: Secondary | ICD-10-CM

## 2018-02-05 DIAGNOSIS — F1010 Alcohol abuse, uncomplicated: Secondary | ICD-10-CM

## 2018-02-05 LAB — CBC WITH DIFFERENTIAL (CANCER CENTER ONLY)
BASOS ABS: 0 10*3/uL (ref 0.0–0.1)
Basophils Relative: 0 %
Eosinophils Absolute: 0 10*3/uL (ref 0.0–0.5)
Eosinophils Relative: 0 %
HEMATOCRIT: 35.7 % — AB (ref 38.4–49.9)
HEMOGLOBIN: 12 g/dL — AB (ref 13.0–17.1)
Lymphocytes Relative: 23 %
Lymphs Abs: 2.1 10*3/uL (ref 0.9–3.3)
MCH: 29.3 pg (ref 27.2–33.4)
MCHC: 33.6 g/dL (ref 32.0–36.0)
MCV: 87.1 fL (ref 79.3–98.0)
MONOS PCT: 26 %
Monocytes Absolute: 2.4 10*3/uL — ABNORMAL HIGH (ref 0.1–0.9)
NEUTROS ABS: 4.7 10*3/uL (ref 1.5–6.5)
NEUTROS PCT: 51 %
Platelet Count: 257 10*3/uL (ref 140–400)
RBC: 4.1 MIL/uL — AB (ref 4.20–5.82)
RDW: 16.6 % — ABNORMAL HIGH (ref 11.0–14.6)
WBC: 9.3 10*3/uL (ref 4.0–10.3)

## 2018-02-05 LAB — CMP (CANCER CENTER ONLY)
ALK PHOS: 72 U/L (ref 38–126)
ALT: 12 U/L (ref 0–44)
AST: 17 U/L (ref 15–41)
Albumin: 3.4 g/dL — ABNORMAL LOW (ref 3.5–5.0)
Anion gap: 7 (ref 5–15)
BILIRUBIN TOTAL: 0.7 mg/dL (ref 0.3–1.2)
BUN: 10 mg/dL (ref 6–20)
CO2: 26 mmol/L (ref 22–32)
CREATININE: 0.77 mg/dL (ref 0.61–1.24)
Calcium: 9.2 mg/dL (ref 8.9–10.3)
Chloride: 105 mmol/L (ref 98–111)
GFR, Est AFR Am: >60 mL/min (ref >60)
GFR, Estimated: >60 mL/min (ref >60)
Glucose, Bld: 77 mg/dL (ref 70–99)
Potassium: 3.9 mmol/L (ref 3.5–5.1)
SODIUM: 138 mmol/L (ref 135–145)
TOTAL PROTEIN: 6.9 g/dL (ref 6.5–8.1)

## 2018-02-05 MED ORDER — SODIUM CHLORIDE 0.9% FLUSH
10.0000 mL | INTRAVENOUS | Status: DC | PRN
  Administered 2018-02-05: 10 mL via INTRAVENOUS
  Filled 2018-02-05: qty 10

## 2018-02-05 MED ORDER — HEPARIN SOD (PORK) LOCK FLUSH 100 UNIT/ML IV SOLN
500.0000 [IU] | Freq: Once | INTRAVENOUS | Status: DC | PRN
  Filled 2018-02-05: qty 5

## 2018-02-05 MED ORDER — MIRTAZAPINE 15 MG PO TABS: 15.0000 mg | ORAL_TABLET | Freq: Every day | ORAL | 1 refills | Status: DC

## 2018-02-05 MED ORDER — SODIUM CHLORIDE 0.9% FLUSH
10.0000 mL | INTRAVENOUS | Status: DC | PRN
  Administered 2018-02-05: 10 mL
  Filled 2018-02-05: qty 10

## 2018-02-05 NOTE — Telephone Encounter (Signed)
Printed avs and calender of upcoming appointment. Per 9/25 los 

## 2018-02-07 ENCOUNTER — Inpatient Hospital Stay: Payer: Medicaid Other

## 2018-02-12 ENCOUNTER — Ambulatory Visit (HOSPITAL_COMMUNITY)
Admission: RE | Admit: 2018-02-12 | Discharge: 2018-02-12 | Disposition: A | Discharge status: Home / Self Care | Payer: Medicaid Other | Source: Ambulatory Visit | Attending: Nurse Practitioner | Admitting: Nurse Practitioner

## 2018-02-12 ENCOUNTER — Encounter (HOSPITAL_COMMUNITY): Payer: Self-pay

## 2018-02-12 DIAGNOSIS — I700 Atherosclerosis of aorta: Secondary | ICD-10-CM | POA: Insufficient documentation

## 2018-02-12 DIAGNOSIS — I313 Pericardial effusion (noninflammatory): Secondary | ICD-10-CM | POA: Diagnosis not present

## 2018-02-12 DIAGNOSIS — J984 Other disorders of lung: Secondary | ICD-10-CM | POA: Diagnosis not present

## 2018-02-12 DIAGNOSIS — K760 Fatty (change of) liver, not elsewhere classified: Secondary | ICD-10-CM | POA: Diagnosis not present

## 2018-02-12 DIAGNOSIS — N281 Cyst of kidney, acquired: Secondary | ICD-10-CM | POA: Diagnosis not present

## 2018-02-12 DIAGNOSIS — R590 Localized enlarged lymph nodes: Secondary | ICD-10-CM | POA: Diagnosis not present

## 2018-02-12 DIAGNOSIS — C161 Malignant neoplasm of fundus of stomach: Secondary | ICD-10-CM | POA: Diagnosis present

## 2018-02-12 MED ORDER — IOHEXOL 300 MG/ML  SOLN
100.0000 mL | Freq: Once | INTRAMUSCULAR | Status: AC | PRN
  Administered 2018-02-12: 100 mL via INTRAVENOUS

## 2018-02-12 MED ORDER — SODIUM CHLORIDE 0.9 % IJ SOLN
INTRAMUSCULAR | Status: AC
  Filled 2018-02-12: qty 50

## 2018-02-17 NOTE — Pre-Procedure Instructions (Signed)
[name]  02/17/2018      [id number] - [place], [address] [place] [address] Phone: [phone] Fax: [place], [place] - [address] [place] [address] Phone: [phone] Fax: [phone]    Your procedure is scheduled on 02/25/2018.  Report to [facility] Admitting at [place].M.  Call this number if you have problems the morning of surgery:  [phone]   Remember:  Do not eat or drink after midnight.      Take these medicines the morning of surgery with A SIP OF WATER: Acetaminophen (Tylenol) - if needed Amlodipine (Norvasc) Ondansetron (Zofran) - if needed Pantoprazole (Protonix)  7 days prior to surgery STOP taking any Aspirin (unless otherwise instructed by your surgeon), Aleve, Naproxen, Ibuprofen, Motrin, Advil, Goody's, BC's, all herbal medications, fish oil, and all vitamins     Do not wear jewelry.  Do not wear lotions, powders, or perfumes, or deodorant.  Men may shave face and neck.  Do not bring valuables to the hospital.  [facility] is not responsible for any belongings or valuables.  Contacts, eyeglasses, hearing aids, dentures or bridgework may not be worn into surgery.  Leave your suitcase in the car.  After surgery it may be brought to your room.  For patients admitted to the hospital, discharge time will be determined by your treatment team.  Patients discharged the day of surgery will not be allowed to drive home.   Name and phone number of your driver:    Special instructions:   [facility]- Preparing For Surgery  Before surgery, you can play an important role. Because skin is not sterile, your skin needs to be as free of germs as possible. You can reduce the number of germs on your skin by washing with CHG (chlorahexidine gluconate) Soap  before surgery.  CHG is an antiseptic cleaner which kills germs and bonds with the skin to continue killing germs even after washing.    Oral Hygiene is also important to reduce your risk of infection.  Remember - BRUSH YOUR TEETH THE MORNING OF SURGERY WITH YOUR REGULAR TOOTHPASTE  Please do not use if you have an allergy to CHG or antibacterial soaps. If your skin becomes reddened/irritated stop using the CHG.  Do not shave (including legs and underarms) for at least 48 hours prior to first CHG shower. It is OK to shave your face.  Please follow these instructions carefully.   1. Shower the NIGHT BEFORE SURGERY and the MORNING OF SURGERY with CHG.   2. If you chose to wash your hair, wash your hair first as usual with your normal shampoo.  3. After you shampoo, rinse your hair and body thoroughly to remove the shampoo.  4. Use CHG as you would any other liquid soap. You can apply CHG directly to the skin and wash gently with a scrungie or a clean washcloth.   5. Apply the CHG Soap to your body ONLY FROM THE NECK DOWN.  Do not use on open wounds or open sores. Avoid contact with your eyes, ears, mouth and genitals (private parts). Wash Face and genitals (private parts)  with your normal soap.  6. Wash thoroughly, paying special attention to the area where your surgery will be performed.  7. Thoroughly rinse your body with warm water from the  neck down.  8. DO NOT shower/wash with your normal soap after using and rinsing off the CHG Soap.  9. Pat yourself dry with a CLEAN TOWEL.  10. Wear CLEAN PAJAMAS to bed the night before surgery, wear comfortable clothes the morning of surgery  11. Place CLEAN SHEETS on your bed the night of your first shower and DO NOT SLEEP WITH PETS.    Day of Surgery: Shower as stated above. Do not apply any deodorants/lotions.  Please wear clean clothes to the hospital/surgery center.   Remember to brush your teeth WITH YOUR REGULAR  TOOTHPASTE.    Please read over the following fact sheets that you were given.

## 2018-02-18 ENCOUNTER — Encounter (HOSPITAL_COMMUNITY): Payer: Self-pay

## 2018-02-18 ENCOUNTER — Other Ambulatory Visit: Payer: Self-pay

## 2018-02-18 ENCOUNTER — Encounter (HOSPITAL_COMMUNITY)
Admission: RE | Admit: 2018-02-18 | Discharge: 2018-02-18 | Disposition: A | Discharge status: Home / Self Care | Payer: Medicaid Other | Source: Ambulatory Visit | Attending: General Surgery | Admitting: General Surgery

## 2018-02-18 DIAGNOSIS — Z01812 Encounter for preprocedural laboratory examination: Secondary | ICD-10-CM | POA: Diagnosis present

## 2018-02-18 HISTORY — DX: Essential (primary) hypertension: I10

## 2018-02-18 HISTORY — DX: Malignant (primary) neoplasm, unspecified: C80.1

## 2018-02-18 LAB — COMPREHENSIVE METABOLIC PANEL
ALBUMIN: 3.4 g/dL — AB (ref 3.5–5.0)
ALK PHOS: 63 U/L (ref 38–126)
ALT: 11 U/L (ref 0–44)
AST: 19 U/L (ref 15–41)
Anion gap: 11 (ref 5–15)
BUN: 9 mg/dL (ref 6–20)
CALCIUM: 9.3 mg/dL (ref 8.9–10.3)
CO2: 24 mmol/L (ref 22–32)
CREATININE: 0.73 mg/dL (ref 0.61–1.24)
Chloride: 101 mmol/L (ref 98–111)
GFR calc non Af Amer: >60 mL/min (ref >60)
GLUCOSE: 101 mg/dL — AB (ref 70–99)
Potassium: 3.9 mmol/L (ref 3.5–5.1)
SODIUM: 136 mmol/L (ref 135–145)
Total Bilirubin: 0.7 mg/dL (ref 0.3–1.2)
Total Protein: 7 g/dL (ref 6.5–8.1)

## 2018-02-18 LAB — CBC WITH DIFFERENTIAL/PLATELET
Abs Immature Granulocytes: 0.15 10*3/uL — ABNORMAL HIGH (ref 0.00–0.07)
Basophils Absolute: 0.1 10*3/uL (ref 0.0–0.1)
Basophils Relative: 0 %
EOS PCT: 1 %
Eosinophils Absolute: 0.1 10*3/uL (ref 0.0–0.5)
HEMATOCRIT: 40.6 % (ref 39.0–52.0)
HEMOGLOBIN: 13.1 g/dL (ref 13.0–17.0)
Immature Granulocytes: 1 %
LYMPHS ABS: 3.4 10*3/uL (ref 0.7–4.0)
LYMPHS PCT: 27 %
MCH: 29.1 pg (ref 26.0–34.0)
MCHC: 32.3 g/dL (ref 30.0–36.0)
MCV: 90.2 fL (ref 80.0–100.0)
MONO ABS: 1.3 10*3/uL — AB (ref 0.1–1.0)
MONOS PCT: 11 %
NEUTROS ABS: 7.6 10*3/uL (ref 1.7–7.7)
Neutrophils Relative %: 60 %
Platelets: 393 10*3/uL (ref 150–400)
RBC: 4.5 MIL/uL (ref 4.22–5.81)
RDW: 16.3 % — ABNORMAL HIGH (ref 11.5–15.5)
WBC: 12.6 10*3/uL — AB (ref 4.0–10.5)
nRBC: 0 % (ref 0.0–0.2)

## 2018-02-18 LAB — PROTIME-INR
INR: 1.07
Prothrombin Time: 13.8 seconds (ref 11.4–15.2)

## 2018-02-18 NOTE — Progress Notes (Signed)
Notified Dr. Barry Dienes of patient's elevated WBC

## 2018-02-18 NOTE — Progress Notes (Signed)
PCP - Dr. Seward Carol Cardiologist - patient denies  Chest x-ray - n/a EKG - 07/12/2017 Stress Test - patient denies ECHO - patient denies Cardiac Cath - patient denies  Sleep Study - patient denies  Anesthesia review: n/a  Patient denies shortness of breath, fever, cough and chest pain at PAT appointment   Patient verbalized understanding of instructions that were given to them at the PAT appointment. Patient was also instructed that they will need to review over the PAT instructions again at home before surgery.

## 2018-02-24 NOTE — H&P (Signed)
Ryan Collins Documented: 02/17/2018 11:46 AM Location: [facility] Patient #: [id number] DOB: 02-Aug-1960 Single / Language: Ryan Collins / Race: Black or African American Male   History of Present Illness [name] MD; 02/17/2018 12:33 PM) The patient is a 57 year old male who presents for a follow-up for Gastric cancer. Pt is a 57 yo M diagnosed in January 2019 when he was admitted wtih pain and upper GI bleed. EGD showed a fungated mass in the cardia of the stomach. His original staging scans were suspicious for a large peritoneal implant, but upon his later imaging, it was determined that that may have just been the mass in the stomach rather than an implant. There was also an indeterminate lesion in the liver that has not essentially changed, and looks unlikely to represent malignancy on recent MRI. He presents to discuss possible surgery. He had regained back much of his weight loss prior to diagnosis. He has not had any residual issues with nausea or bloody stools.  Of note, his sister died of metastatic breast cancer.  He had his last dose of chemo 2-3 weeks ago. His appetite is getting better. He is set up for surgery 10/15.  Restaging CTs 02/12/2018 IMPRESSION: 1. Roughly similar appearance of the mass involving the gastric cardia/fundus. 2. Hepatic steatosis with some areas of fatty sparing as shown on MRI. No definite focal metastatic lesion to the liver is identified. 3. Nonspecific 0.8 cm lymph node between the tail the pancreas in the splenic hilum. There is also a mildly enlarged but stable right hilar lymph node at 1.0 cm in diameter, significance uncertain. 4. Overall stable appearance of the 1.9 cm Bosniak category 52F cyst of the right kidney lower pole. We have now documented 9 months of relative stability. Surveillance at or before 6 months time is recommended. 5. Other imaging findings of potential clinical significance: Aortic Atherosclerosis  (ICD10-I70.0). Small anterior pericardial effusion. Stable scarring in the left lower lobe. Medial hypoenhancement of the upper spleen is probably from early phase of contrast, less likely to be splenic infarct given the relatively normal appearance on delayed images. Impingement at L4-5 and L5-S1.     EGD 05/2017 A large friable fungating gastric mass involving the cardia and the fundus was identified. The mass extended approximately 5 cm. It was difficult to measure the width of the lesion. There was a large necrotic and deep ulcer in the mid portion of the mass. It is estimated that it encompasses 25% of the fundic circumfirence. Multiple cold biopsies were obtained.  MRI abd 12/23/2017  IMPRESSION: Ill-defined soft tissue mass in the gastric fundus shows no significant change, consistent known gastric adenocarcinoma.  No definite liver metastases identified. Hemosiderosis noted, with focal area of sparing seen in the right hepatic lobe which accounts for the lesions seen on recent CT.  Stable 1.9 cm probably benign Bosniak category 2 F cystic lesion in right kidney. Recommend continued attention on follow-up imaging.  CT chest/abd/pelvis 11/25/2017 IMPRESSION: 1. Mixed appearance, with the gastric fundal mass thicker than it was on 08/29/2017 (although still improved compared to 06/05/2017), but with reduced local conglomerate adenopathy along the splenic hilum compared to 08/29/2017. 2. At this time I do not see any definite hepatic metastatic lesions. Several lesions of suspicion in the left hepatic lobe and inferiorly in the right hepatic lobe are not readily visible today. There is a small focus of hypodensity adjacent to the gallbladder fossa in segment 7 which is technically nonspecific but  which could reflect focal fatty infiltration. 3. Increase in nodular contour of scarring in the left lower lobe. This is probably incidental but I cannot completely exclude  the possibility of a localized metastatic lesion. Surveillance of the left lower lobe suggested. 4. Bosniak category 2F cyst of the right kidney lower pole with internal septation and calcification. Surveillance of this lesion is suggested; I suspect that the patient's cancer surveillance will supersede the typical recommended surveillance schedule for complex cyst. 5. Other imaging findings of potential clinical significance: Aortic Atherosclerosis (ICD10-I70.0). Degenerative glenohumeral arthropathy bilaterally. Mild impingement at L4-5 and L5-S1.  Pathology 06/04/2017 Diagnosis Stomach, biopsy, Fundus - ADENOCARCINOMA   Allergies (Ryan Collins, [place]; 02/17/2018 11:47 AM) No Known Drug Allergies [01/02/2018]: Allergies Reconciled   Medication History (Ryan Collins, [place]; 02/17/2018 11:47 AM) AmLODIPine Besylate (5MG  Tablet, Oral) Active. Mirtazapine (7.5MG  Tablet, Oral) Active. Pantoprazole Sodium (40MG  Tablet DR, Oral) Active. Prochlorperazine Maleate (10MG  Tablet, Oral) Active. Vitamin B-12 (1000MCG Tablet, Oral) Active. Vitamin B-1 (100MG  Tablet, Oral) Active. Acetaminophen (500MG  Tablet, Oral) Active. Lidocaine (2.5% Gel, External) Active. Zofran (8MG  Tablet, Oral) Active. Thiamine HCl (100MG  Tablet, Oral) Active. Medications Reconciled    Review of Systems [name] MD; 02/17/2018 12:21 PM) All other systems negative  Vitals (Ryan Collins RMA; 02/17/2018 11:46 AM) 02/17/2018 11:46 AM Weight: 152.4 lb Height: 71in Body Surface Area: 1.88 m Body Mass Index: 21.26 kg/m  Temp.: 97.70F  Pulse: 103 (Regular)  BP: 128/88 (Sitting, Left Arm, Standard)       Physical Exam [name] MD; 02/17/2018 12:33 PM) General Mental Status-Alert. General Appearance-Consistent with stated age. Hydration-Well hydrated. Voice-Normal.  Head and Neck Head-normocephalic, atraumatic with no lesions or palpable  masses.  Eye Sclera/Conjunctiva - Bilateral-No scleral icterus.  Chest and Lung Exam Chest and lung exam reveals -quiet, even and easy respiratory effort with no use of accessory muscles. Inspection Chest Wall - Normal. Back - normal.  Breast - Did not examine.  Cardiovascular Cardiovascular examination reveals -normal pedal pulses bilaterally. Note: regular rate and rhythm  Abdomen Inspection-Inspection Normal. Palpation/Percussion Palpation and Percussion of the abdomen reveal - Soft, Non Tender, No Rebound tenderness, No Rigidity (guarding) and No hepatosplenomegaly.  Peripheral Vascular Upper Extremity Inspection - Bilateral - Normal - No Clubbing, No Cyanosis, No Edema, Pulses Intact. Lower Extremity Palpation - Edema - Bilateral - No edema.  Neurologic Neurologic evaluation reveals -alert and oriented x 3 with no impairment of recent or remote memory. Mental Status-Normal.  Musculoskeletal Global Assessment -Note: no gross deformities.  Normal Exam - Left-Upper Extremity Strength Normal and Lower Extremity Strength Normal. Normal Exam - Right-Upper Extremity Strength Normal and Lower Extremity Strength Normal.  Lymphatic Head & Neck  General Head & Neck Lymphatics: Bilateral - Description - Normal. Axillary  General Axillary Region: Bilateral - Description - Normal. Tenderness - Non Tender.    Assessment & Plan [name] MD; 02/17/2018 12:34 PM) MALIGNANT NEOPLASM OF CARDIA OF STOMACH (C16.0) Impression: Discussed surgery again.  REviewed diagnostic laparoscopy. Discussed that carcinomatosis would make [place] abort surgery.  Also discussed total gastrectomy and the way we reconstruct this. Reviewed J tube and timing of this as well as post op recovery. We are on board for next week. Current Plans Instructed to keep follow-up appointment as scheduled Pt Education - [facility]   Signed by [name], MD (02/17/2018  12:34 PM)

## 2018-02-24 NOTE — Anesthesia Preprocedure Evaluation (Addendum)
Anesthesia Evaluation  Patient identified by MRN, date of birth, ID band Patient awake    Reviewed: Allergy & Precautions, H&P , NPO status , Patient's Chart, lab work & pertinent test results  Airway Mallampati: II  TM Distance: >3 FB Neck ROM: Full    Dental no notable dental hx. (+) Loose, Poor Dentition, Dental Advisory Given,    Pulmonary neg pulmonary ROS,    Pulmonary exam normal breath sounds clear to auscultation       Cardiovascular hypertension, Pt. on medications Normal cardiovascular exam Rhythm:Regular Rate:Normal     Neuro/Psych PSYCHIATRIC DISORDERS negative neurological ROS     GI/Hepatic Neg liver ROS, PUD, GERD  Medicated,  Endo/Other    Renal/GU negative Renal ROS     Musculoskeletal   Abdominal   Peds  Hematology  (+) Blood dyscrasia, anemia ,   Anesthesia Other Findings Gastric Cancer Epidural;  Reproductive/Obstetrics                           Lab Results  Component Value Date   WBC 12.6 (H) 02/18/2018   HGB 13.1 02/18/2018   HCT 40.6 02/18/2018   MCV 90.2 02/18/2018   PLT 393 02/18/2018   Lab Results  Component Value Date   CREATININE 0.73 02/18/2018   BUN 9 02/18/2018   NA 136 02/18/2018   K 3.9 02/18/2018   CL 101 02/18/2018   CO2 24 02/18/2018     Anesthesia Physical Anesthesia Plan  ASA: II  Anesthesia Plan: General   Post-op Pain Management: GA combined w/ Regional for post-op pain   Induction: Intravenous  PONV Risk Score and Plan: Treatment may vary due to age or medical condition, Ondansetron and Dexamethasone  Airway Management Planned: Oral ETT  Additional Equipment:   Intra-op Plan:   Post-operative Plan: Extubation in OR  Informed Consent: I have reviewed the patients History and Physical, chart, labs and discussed the procedure including the risks, benefits and alternatives for the proposed anesthesia with the patient or  authorized representative who has indicated his/her understanding and acceptance.   Dental advisory given  Plan Discussed with: CRNA, Anesthesiologist and Surgeon  Anesthesia Plan Comments:        Anesthesia Quick Evaluation

## 2018-02-25 ENCOUNTER — Encounter (HOSPITAL_COMMUNITY)
Admission: RE | Disposition: A | Discharge status: Home / Self Care | Payer: Self-pay | Source: Ambulatory Visit | Attending: General Surgery

## 2018-02-25 ENCOUNTER — Inpatient Hospital Stay (HOSPITAL_COMMUNITY): Payer: Medicaid Other | Admitting: Anesthesiology

## 2018-02-25 ENCOUNTER — Inpatient Hospital Stay (HOSPITAL_COMMUNITY)
Admission: RE | Admit: 2018-02-25 | Discharge: 2018-03-06 | DRG: 326 | Disposition: A | Discharge status: Home / Self Care | Payer: Medicaid Other | Source: Ambulatory Visit | Attending: General Surgery | Admitting: General Surgery

## 2018-02-25 ENCOUNTER — Inpatient Hospital Stay (HOSPITAL_COMMUNITY): Payer: Medicaid Other | Admitting: Physician Assistant

## 2018-02-25 DIAGNOSIS — E871 Hypo-osmolality and hyponatremia: Secondary | ICD-10-CM | POA: Diagnosis not present

## 2018-02-25 DIAGNOSIS — Z95828 Presence of other vascular implants and grafts: Secondary | ICD-10-CM | POA: Diagnosis not present

## 2018-02-25 DIAGNOSIS — K279 Peptic ulcer, site unspecified, unspecified as acute or chronic, without hemorrhage or perforation: Secondary | ICD-10-CM | POA: Diagnosis present

## 2018-02-25 DIAGNOSIS — Z23 Encounter for immunization: Secondary | ICD-10-CM

## 2018-02-25 DIAGNOSIS — R402140 Coma scale, eyes open, spontaneous, unspecified time: Secondary | ICD-10-CM | POA: Diagnosis present

## 2018-02-25 DIAGNOSIS — R402360 Coma scale, best motor response, obeys commands, unspecified time: Secondary | ICD-10-CM | POA: Diagnosis present

## 2018-02-25 DIAGNOSIS — D62 Acute posthemorrhagic anemia: Secondary | ICD-10-CM | POA: Diagnosis not present

## 2018-02-25 DIAGNOSIS — Z681 Body mass index (BMI) 19 or less, adult: Secondary | ICD-10-CM

## 2018-02-25 DIAGNOSIS — I10 Essential (primary) hypertension: Secondary | ICD-10-CM | POA: Diagnosis present

## 2018-02-25 DIAGNOSIS — I959 Hypotension, unspecified: Secondary | ICD-10-CM | POA: Diagnosis present

## 2018-02-25 DIAGNOSIS — Z903 Acquired absence of stomach [part of]: Secondary | ICD-10-CM

## 2018-02-25 DIAGNOSIS — K219 Gastro-esophageal reflux disease without esophagitis: Secondary | ICD-10-CM | POA: Diagnosis present

## 2018-02-25 DIAGNOSIS — Z79899 Other long term (current) drug therapy: Secondary | ICD-10-CM

## 2018-02-25 DIAGNOSIS — Z934 Other artificial openings of gastrointestinal tract status: Secondary | ICD-10-CM

## 2018-02-25 DIAGNOSIS — E43 Unspecified severe protein-calorie malnutrition: Secondary | ICD-10-CM | POA: Diagnosis present

## 2018-02-25 DIAGNOSIS — R402250 Coma scale, best verbal response, oriented, unspecified time: Secondary | ICD-10-CM | POA: Diagnosis present

## 2018-02-25 DIAGNOSIS — K4090 Unilateral inguinal hernia, without obstruction or gangrene, not specified as recurrent: Secondary | ICD-10-CM | POA: Diagnosis present

## 2018-02-25 DIAGNOSIS — C169 Malignant neoplasm of stomach, unspecified: Secondary | ICD-10-CM | POA: Diagnosis present

## 2018-02-25 DIAGNOSIS — Z980 Intestinal bypass and anastomosis status: Secondary | ICD-10-CM

## 2018-02-25 HISTORY — PX: JEJUNOSTOMY: SHX313

## 2018-02-25 HISTORY — PX: SPLENECTOMY, TOTAL: SHX788

## 2018-02-25 HISTORY — PX: LAPAROSCOPY: SHX197

## 2018-02-25 HISTORY — PX: GASTRECTOMY: SHX58

## 2018-02-25 LAB — CBC
HCT: 23.7 % — ABNORMAL LOW (ref 39.0–52.0)
HEMOGLOBIN: 7.6 g/dL — AB (ref 13.0–17.0)
MCH: 28.1 pg (ref 26.0–34.0)
MCHC: 32.1 g/dL (ref 30.0–36.0)
MCV: 87.8 fL (ref 80.0–100.0)
Platelets: 313 10*3/uL (ref 150–400)
RBC: 2.7 MIL/uL — ABNORMAL LOW (ref 4.22–5.81)
RDW: 17 % — AB (ref 11.5–15.5)
WBC: 20.6 10*3/uL — ABNORMAL HIGH (ref 4.0–10.5)
nRBC: 0 % (ref 0.0–0.2)

## 2018-02-25 LAB — URINALYSIS, ROUTINE W REFLEX MICROSCOPIC
BILIRUBIN URINE: NEGATIVE
GLUCOSE, UA: NEGATIVE mg/dL
Hgb urine dipstick: NEGATIVE
KETONES UR: 5 mg/dL — AB
Leukocytes, UA: NEGATIVE
Nitrite: NEGATIVE
PH: 5 (ref 5.0–8.0)
Protein, ur: NEGATIVE mg/dL
SPECIFIC GRAVITY, URINE: 1.03 (ref 1.005–1.030)

## 2018-02-25 LAB — PREPARE RBC (CROSSMATCH)

## 2018-02-25 LAB — MRSA PCR SCREENING: MRSA by PCR: NEGATIVE

## 2018-02-25 SURGERY — LAPAROSCOPY, DIAGNOSTIC
Anesthesia: General | Site: Abdomen

## 2018-02-25 MED ORDER — BUPIVACAINE HCL (PF) 0.25 % IJ SOLN
INTRAMUSCULAR | Status: AC
  Filled 2018-02-25: qty 30

## 2018-02-25 MED ORDER — EPHEDRINE 5 MG/ML INJ
INTRAVENOUS | Status: AC
  Filled 2018-02-25: qty 10

## 2018-02-25 MED ORDER — HYDROMORPHONE HCL 1 MG/ML IJ SOLN
INTRAMUSCULAR | Status: AC
  Filled 2018-02-25: qty 1

## 2018-02-25 MED ORDER — ROCURONIUM BROMIDE 10 MG/ML (PF) SYRINGE
PREFILLED_SYRINGE | INTRAVENOUS | Status: DC | PRN
  Administered 2018-02-25: 10 mg via INTRAVENOUS
  Administered 2018-02-25: 20 mg via INTRAVENOUS
  Administered 2018-02-25: 10 mg via INTRAVENOUS
  Administered 2018-02-25: 20 mg via INTRAVENOUS
  Administered 2018-02-25: 10 mg via INTRAVENOUS
  Administered 2018-02-25: 50 mg via INTRAVENOUS
  Administered 2018-02-25 (×2): 10 mg via INTRAVENOUS

## 2018-02-25 MED ORDER — PROCHLORPERAZINE MALEATE 10 MG PO TABS
10.0000 mg | ORAL_TABLET | Freq: Four times a day (QID) | ORAL | Status: DC | PRN
  Filled 2018-02-25: qty 1

## 2018-02-25 MED ORDER — FENTANYL CITRATE (PF) 250 MCG/5ML IJ SOLN
INTRAMUSCULAR | Status: AC
  Filled 2018-02-25: qty 5

## 2018-02-25 MED ORDER — PROPOFOL 10 MG/ML IV BOLUS
INTRAVENOUS | Status: DC | PRN
  Administered 2018-02-25: 120 mg via INTRAVENOUS

## 2018-02-25 MED ORDER — BUPIVACAINE-EPINEPHRINE (PF) 0.25% -1:200000 IJ SOLN
INTRAMUSCULAR | Status: AC
  Filled 2018-02-25: qty 30

## 2018-02-25 MED ORDER — PROCHLORPERAZINE EDISYLATE 10 MG/2ML IJ SOLN: 5.0000 mg | Freq: Four times a day (QID) | INTRAMUSCULAR | Status: DC | PRN

## 2018-02-25 MED ORDER — CEFAZOLIN SODIUM-DEXTROSE 2-4 GM/100ML-% IV SOLN
INTRAVENOUS | Status: AC
  Filled 2018-02-25: qty 100

## 2018-02-25 MED ORDER — LIDOCAINE 2% (20 MG/ML) 5 ML SYRINGE
INTRAMUSCULAR | Status: AC
  Filled 2018-02-25: qty 5

## 2018-02-25 MED ORDER — HYDRALAZINE HCL 20 MG/ML IJ SOLN: 10.0000 mg | INTRAMUSCULAR | Status: DC | PRN

## 2018-02-25 MED ORDER — ROPIVACAINE HCL 2 MG/ML IJ SOLN
INTRAMUSCULAR | Status: DC | PRN
  Administered 2018-02-25: 5 mL/h via EPIDURAL

## 2018-02-25 MED ORDER — ACETAMINOPHEN 500 MG PO TABS
ORAL_TABLET | ORAL | Status: AC
  Administered 2018-02-25: 1000 mg via ORAL
  Filled 2018-02-25: qty 2

## 2018-02-25 MED ORDER — DIPHENHYDRAMINE HCL 12.5 MG/5ML PO ELIX: 12.5000 mg | ORAL_SOLUTION | Freq: Four times a day (QID) | ORAL | Status: DC | PRN

## 2018-02-25 MED ORDER — FAMOTIDINE IN NACL 20-0.9 MG/50ML-% IV SOLN
20.0000 mg | Freq: Two times a day (BID) | INTRAVENOUS | Status: DC
  Administered 2018-02-25 – 2018-03-03 (×13): 20 mg via INTRAVENOUS
  Filled 2018-02-25 (×13): qty 50

## 2018-02-25 MED ORDER — ROCURONIUM BROMIDE 50 MG/5ML IV SOSY
PREFILLED_SYRINGE | INTRAVENOUS | Status: AC
  Filled 2018-02-25: qty 15

## 2018-02-25 MED ORDER — HYDROCODONE-ACETAMINOPHEN 7.5-325 MG PO TABS: 1.0000 | ORAL_TABLET | Freq: Once | ORAL | Status: DC | PRN

## 2018-02-25 MED ORDER — EVICEL 2 ML EX KIT
PACK | CUTANEOUS | Status: AC
  Filled 2018-02-25: qty 1

## 2018-02-25 MED ORDER — ONDANSETRON HCL 4 MG/2ML IJ SOLN
INTRAMUSCULAR | Status: AC
  Filled 2018-02-25: qty 2

## 2018-02-25 MED ORDER — SODIUM CHLORIDE 0.9% FLUSH: 9.0000 mL | INTRAVENOUS | Status: DC | PRN

## 2018-02-25 MED ORDER — ONDANSETRON HCL 4 MG/2ML IJ SOLN
INTRAMUSCULAR | Status: DC | PRN
  Administered 2018-02-25: 4 mg via INTRAVENOUS

## 2018-02-25 MED ORDER — SODIUM CHLORIDE 0.9 % IV SOLN
INTRAVENOUS | Status: DC | PRN
  Administered 2018-02-25: 40 ug/min via INTRAVENOUS

## 2018-02-25 MED ORDER — ACETAMINOPHEN 10 MG/ML IV SOLN: 1000.0000 mg | Freq: Once | INTRAVENOUS | Status: DC | PRN

## 2018-02-25 MED ORDER — PHENOL 1.4 % MT LIQD
1.0000 | OROMUCOSAL | Status: DC | PRN
  Filled 2018-02-25: qty 177

## 2018-02-25 MED ORDER — ACETAMINOPHEN 500 MG PO TABS
1000.0000 mg | ORAL_TABLET | ORAL | Status: AC
  Administered 2018-02-25: 1000 mg via ORAL

## 2018-02-25 MED ORDER — PROPOFOL 10 MG/ML IV BOLUS
INTRAVENOUS | Status: AC
  Filled 2018-02-25: qty 20

## 2018-02-25 MED ORDER — EVICEL 5 ML EX KIT
PACK | CUTANEOUS | Status: AC
  Filled 2018-02-25: qty 2

## 2018-02-25 MED ORDER — SUCCINYLCHOLINE CHLORIDE 200 MG/10ML IV SOSY
PREFILLED_SYRINGE | INTRAVENOUS | Status: AC
  Filled 2018-02-25: qty 10

## 2018-02-25 MED ORDER — FENTANYL CITRATE (PF) 100 MCG/2ML IJ SOLN
INTRAMUSCULAR | Status: DC | PRN
  Administered 2018-02-25 (×2): 50 ug via INTRAVENOUS
  Administered 2018-02-25: 100 ug via INTRAVENOUS
  Administered 2018-02-25 (×2): 50 ug via INTRAVENOUS

## 2018-02-25 MED ORDER — LACTATED RINGERS IV SOLN
INTRAVENOUS | Status: DC | PRN
  Administered 2018-02-25 (×4): via INTRAVENOUS

## 2018-02-25 MED ORDER — LIDOCAINE HCL 1 % IJ SOLN
INTRAMUSCULAR | Status: AC
  Filled 2018-02-25: qty 20

## 2018-02-25 MED ORDER — HEMOSTATIC AGENTS (NO CHARGE) OPTIME
TOPICAL | Status: DC | PRN
  Administered 2018-02-25: 1 via TOPICAL

## 2018-02-25 MED ORDER — METHOCARBAMOL 1000 MG/10ML IJ SOLN
500.0000 mg | Freq: Four times a day (QID) | INTRAVENOUS | Status: DC | PRN
  Administered 2018-02-27: 500 mg via INTRAVENOUS
  Filled 2018-02-25: qty 500
  Filled 2018-02-25 (×2): qty 5

## 2018-02-25 MED ORDER — ALBUMIN HUMAN 5 % IV SOLN
12.5000 g | Freq: Once | INTRAVENOUS | Status: AC
  Administered 2018-02-25: 12.5 g via INTRAVENOUS

## 2018-02-25 MED ORDER — MEPERIDINE HCL 50 MG/ML IJ SOLN: 6.2500 mg | INTRAMUSCULAR | Status: DC | PRN

## 2018-02-25 MED ORDER — SUGAMMADEX SODIUM 200 MG/2ML IV SOLN
INTRAVENOUS | Status: DC | PRN
  Administered 2018-02-25: 300 mg via INTRAVENOUS

## 2018-02-25 MED ORDER — SODIUM CHLORIDE 0.9% IV SOLUTION
Freq: Once | INTRAVENOUS | Status: AC
  Administered 2018-02-25: via INTRAVENOUS

## 2018-02-25 MED ORDER — MIDAZOLAM HCL 5 MG/5ML IJ SOLN
INTRAMUSCULAR | Status: DC | PRN
  Administered 2018-02-25: 2 mg via INTRAVENOUS

## 2018-02-25 MED ORDER — LIDOCAINE HCL (CARDIAC) PF 100 MG/5ML IV SOSY
PREFILLED_SYRINGE | INTRAVENOUS | Status: DC | PRN
  Administered 2018-02-25: 100 mg via INTRAVENOUS

## 2018-02-25 MED ORDER — DIPHENHYDRAMINE HCL 50 MG/ML IJ SOLN: 12.5000 mg | Freq: Four times a day (QID) | INTRAMUSCULAR | Status: DC | PRN

## 2018-02-25 MED ORDER — CEFAZOLIN SODIUM-DEXTROSE 2-4 GM/100ML-% IV SOLN
2.0000 g | Freq: Three times a day (TID) | INTRAVENOUS | Status: AC
  Administered 2018-02-25: 2 g via INTRAVENOUS
  Filled 2018-02-25: qty 100

## 2018-02-25 MED ORDER — HYDROMORPHONE HCL 1 MG/ML IJ SOLN
0.2500 mg | INTRAMUSCULAR | Status: DC | PRN
  Administered 2018-02-25: 1 mg via INTRAVENOUS

## 2018-02-25 MED ORDER — DIPHENHYDRAMINE HCL 50 MG/ML IJ SOLN
12.5000 mg | Freq: Four times a day (QID) | INTRAMUSCULAR | Status: DC | PRN
  Administered 2018-02-27: 12.5 mg via INTRAVENOUS
  Filled 2018-02-25: qty 1

## 2018-02-25 MED ORDER — EVICEL 5 ML EX KIT
PACK | CUTANEOUS | Status: DC | PRN
  Administered 2018-02-25: 5 mL

## 2018-02-25 MED ORDER — LACTATED RINGERS IV BOLUS
1000.0000 mL | Freq: Once | INTRAVENOUS | Status: AC
  Administered 2018-02-25: 1000 mL via INTRAVENOUS

## 2018-02-25 MED ORDER — KCL IN DEXTROSE-NACL 20-5-0.45 MEQ/L-%-% IV SOLN
INTRAVENOUS | Status: DC
  Administered 2018-02-25 – 2018-03-03 (×9): via INTRAVENOUS
  Filled 2018-02-25 (×12): qty 1000

## 2018-02-25 MED ORDER — ACETAMINOPHEN 10 MG/ML IV SOLN
1000.0000 mg | Freq: Four times a day (QID) | INTRAVENOUS | Status: AC
  Administered 2018-02-25 – 2018-02-26 (×4): 1000 mg via INTRAVENOUS
  Filled 2018-02-25 (×4): qty 100

## 2018-02-25 MED ORDER — EPHEDRINE SULFATE-NACL 50-0.9 MG/10ML-% IV SOSY
PREFILLED_SYRINGE | INTRAVENOUS | Status: DC | PRN
  Administered 2018-02-25: 5 mg via INTRAVENOUS
  Administered 2018-02-25: 20 mg via INTRAVENOUS
  Administered 2018-02-25: 5 mg via INTRAVENOUS

## 2018-02-25 MED ORDER — ROPIVACAINE HCL 2 MG/ML IJ SOLN
5.0000 mL/h | INTRAMUSCULAR | Status: AC
  Administered 2018-02-26 – 2018-02-28 (×2): 5 mL/h via EPIDURAL
  Filled 2018-02-25 (×6): qty 200

## 2018-02-25 MED ORDER — PROMETHAZINE HCL 25 MG/ML IJ SOLN: 6.2500 mg | INTRAMUSCULAR | Status: DC | PRN

## 2018-02-25 MED ORDER — ONDANSETRON HCL 4 MG/2ML IJ SOLN: 4.0000 mg | Freq: Four times a day (QID) | INTRAMUSCULAR | Status: DC | PRN

## 2018-02-25 MED ORDER — MIDAZOLAM HCL 2 MG/2ML IJ SOLN
INTRAMUSCULAR | Status: AC
  Filled 2018-02-25: qty 2

## 2018-02-25 MED ORDER — CHLORHEXIDINE GLUCONATE CLOTH 2 % EX PADS: 6.0000 | MEDICATED_PAD | Freq: Once | CUTANEOUS | Status: DC

## 2018-02-25 MED ORDER — DEXAMETHASONE SODIUM PHOSPHATE 10 MG/ML IJ SOLN
INTRAMUSCULAR | Status: DC | PRN
  Administered 2018-02-25: 10 mg via INTRAVENOUS

## 2018-02-25 MED ORDER — DEXAMETHASONE SODIUM PHOSPHATE 10 MG/ML IJ SOLN
INTRAMUSCULAR | Status: AC
  Filled 2018-02-25: qty 1

## 2018-02-25 MED ORDER — PHENYLEPHRINE 40 MCG/ML (10ML) SYRINGE FOR IV PUSH (FOR BLOOD PRESSURE SUPPORT)
PREFILLED_SYRINGE | INTRAVENOUS | Status: DC | PRN
  Administered 2018-02-25: 120 ug via INTRAVENOUS
  Administered 2018-02-25: 80 ug via INTRAVENOUS
  Administered 2018-02-25: 120 ug via INTRAVENOUS
  Administered 2018-02-25: 80 ug via INTRAVENOUS
  Administered 2018-02-25: 120 ug via INTRAVENOUS

## 2018-02-25 MED ORDER — CEFAZOLIN SODIUM-DEXTROSE 2-4 GM/100ML-% IV SOLN
2.0000 g | INTRAVENOUS | Status: AC
  Administered 2018-02-25 (×2): 2 g via INTRAVENOUS

## 2018-02-25 MED ORDER — ALBUMIN HUMAN 5 % IV SOLN
INTRAVENOUS | Status: DC | PRN
  Administered 2018-02-25 (×2): via INTRAVENOUS

## 2018-02-25 MED ORDER — NALOXONE HCL 0.4 MG/ML IJ SOLN: 0.4000 mg | INTRAMUSCULAR | Status: DC | PRN

## 2018-02-25 MED ORDER — ALBUMIN HUMAN 5 % IV SOLN
INTRAVENOUS | Status: AC
  Filled 2018-02-25: qty 250

## 2018-02-25 MED ORDER — SODIUM CHLORIDE 0.9 % IR SOLN
Status: DC | PRN
  Administered 2018-02-25: 1000 mL

## 2018-02-25 MED ORDER — LIDOCAINE HCL 1 % IJ SOLN
INTRAMUSCULAR | Status: DC | PRN
  Administered 2018-02-25: 7 mL

## 2018-02-25 MED ORDER — HYDROMORPHONE 1 MG/ML IV SOLN
INTRAVENOUS | Status: DC
  Administered 2018-02-25: 1.4 mg via INTRAVENOUS
  Administered 2018-02-25: 16:00:00 via INTRAVENOUS
  Administered 2018-02-26: 1.9 mg via INTRAVENOUS
  Administered 2018-02-26: 0.6 mg via INTRAVENOUS
  Administered 2018-02-26: 3.9 mg via INTRAVENOUS
  Administered 2018-02-26: 0.9 mg via INTRAVENOUS
  Administered 2018-02-26: 2.7 mg via INTRAVENOUS
  Administered 2018-02-26: 3.9 mg via INTRAVENOUS
  Administered 2018-02-27: 25 mg via INTRAVENOUS
  Administered 2018-02-27: 1.8 mg via INTRAVENOUS
  Administered 2018-02-27: 3 mg via INTRAVENOUS
  Administered 2018-02-27: 2.1 mg via INTRAVENOUS
  Administered 2018-02-27: 2.4 mg via INTRAVENOUS
  Administered 2018-02-27: 3.3 mg via INTRAVENOUS
  Administered 2018-02-28: 04:00:00 via INTRAVENOUS
  Administered 2018-03-01: 1 mg via INTRAVENOUS
  Administered 2018-03-02: 2.1 mg via INTRAVENOUS
  Administered 2018-03-02: 2.7 mg via INTRAVENOUS
  Administered 2018-03-02: 3.6 mg via INTRAVENOUS
  Administered 2018-03-03: 25 mg via INTRAVENOUS
  Administered 2018-03-03: 1.5 mg via INTRAVENOUS
  Administered 2018-03-03: 2.4 mg via INTRAVENOUS
  Filled 2018-02-25 (×5): qty 25

## 2018-02-25 SURGICAL SUPPLY — 96 items
BIOPATCH RED 1 DISK 7.0 (GAUZE/BANDAGES/DRESSINGS) ×2 IMPLANT
BLADE CLIPPER SURG (BLADE) ×2 IMPLANT
BLADE SURG 10 STRL SS (BLADE) ×2 IMPLANT
CANISTER SUCT 3000ML PPV (MISCELLANEOUS) ×4 IMPLANT
CHLORAPREP W/TINT 26ML (MISCELLANEOUS) ×2 IMPLANT
CLIP VESOCCLUDE LG 6/CT (CLIP) IMPLANT
CLIP VESOCCLUDE MED 24/CT (CLIP) IMPLANT
CLIP VESOLOCK LG 6/CT PURPLE (CLIP) IMPLANT
CLIP VESOLOCK MED 6/CT (CLIP) IMPLANT
CLIP VESOLOCK MED LG 6/CT (CLIP) IMPLANT
CONT SPEC 4OZ CLIKSEAL STRL BL (MISCELLANEOUS) ×6 IMPLANT
COVER SURGICAL LIGHT HANDLE (MISCELLANEOUS) ×2 IMPLANT
COVER WAND RF STERILE (DRAPES) IMPLANT
DECANTER SPIKE VIAL GLASS SM (MISCELLANEOUS) ×4 IMPLANT
DERMABOND ADVANCED (GAUZE/BANDAGES/DRESSINGS) ×1
DERMABOND ADVANCED .7 DNX12 (GAUZE/BANDAGES/DRESSINGS) ×1 IMPLANT
DRAIN CHANNEL 19F RND (DRAIN) ×2 IMPLANT
DRAIN PENROSE 1/2X12 LTX STRL (WOUND CARE) ×2 IMPLANT
DRAIN PENROSE 1/2X36 STERILE (WOUND CARE) IMPLANT
DRAPE LAPAROSCOPIC ABDOMINAL (DRAPES) ×2 IMPLANT
DRAPE UTILITY XL STRL (DRAPES) ×2 IMPLANT
DRAPE WARM FLUID 44X44 (DRAPE) ×2 IMPLANT
DRSG COVADERM 4X10 (GAUZE/BANDAGES/DRESSINGS) IMPLANT
DRSG COVADERM 4X14 (GAUZE/BANDAGES/DRESSINGS) ×2 IMPLANT
DRSG COVADERM 4X8 (GAUZE/BANDAGES/DRESSINGS) IMPLANT
DRSG TEGADERM 2-3/8X2-3/4 SM (GAUZE/BANDAGES/DRESSINGS) ×2 IMPLANT
DRSG TELFA 3X8 NADH (GAUZE/BANDAGES/DRESSINGS) ×2 IMPLANT
ELECT BLADE 6.5 EXT (BLADE) ×2 IMPLANT
ELECT REM PT RETURN 9FT ADLT (ELECTROSURGICAL) ×2
ELECTRODE REM PT RTRN 9FT ADLT (ELECTROSURGICAL) ×1 IMPLANT
EVACUATOR SILICONE 100CC (DRAIN) ×2 IMPLANT
GAUZE SPONGE 4X4 12PLY STRL (GAUZE/BANDAGES/DRESSINGS) ×2 IMPLANT
GLOVE BIO SURGEON STRL SZ 6 (GLOVE) ×8 IMPLANT
GLOVE BIOGEL PI IND STRL 6.5 (GLOVE) ×1 IMPLANT
GLOVE BIOGEL PI IND STRL 7.0 (GLOVE) ×1 IMPLANT
GLOVE BIOGEL PI INDICATOR 6.5 (GLOVE) ×1
GLOVE BIOGEL PI INDICATOR 7.0 (GLOVE) ×1
GLOVE INDICATOR 6.5 STRL GRN (GLOVE) ×6 IMPLANT
GOWN STRL REUS W/ TWL LRG LVL3 (GOWN DISPOSABLE) ×3 IMPLANT
GOWN STRL REUS W/TWL 2XL LVL3 (GOWN DISPOSABLE) ×2 IMPLANT
GOWN STRL REUS W/TWL LRG LVL3 (GOWN DISPOSABLE) ×3
HANDLE SUCTION POOLE (INSTRUMENTS) ×1 IMPLANT
HEMOSTAT SURGICEL 2X14 (HEMOSTASIS) ×2 IMPLANT
KIT BASIN OR (CUSTOM PROCEDURE TRAY) ×2 IMPLANT
KIT TUBE JEJUNAL 16FR (CATHETERS) ×2 IMPLANT
KIT TURNOVER KIT B (KITS) ×2 IMPLANT
L-HOOK LAP DISP 36CM (ELECTROSURGICAL) ×2
LHOOK LAP DISP 36CM (ELECTROSURGICAL) ×1 IMPLANT
NS IRRIG 1000ML POUR BTL (IV SOLUTION) ×4 IMPLANT
PACK GENERAL/GYN (CUSTOM PROCEDURE TRAY) ×2 IMPLANT
PAD ARMBOARD 7.5X6 YLW CONV (MISCELLANEOUS) ×4 IMPLANT
PENCIL BUTTON HOLSTER BLD 10FT (ELECTRODE) ×2 IMPLANT
PENCIL SMOKE EVACUATOR (MISCELLANEOUS) ×2 IMPLANT
RELOAD PROXIMATE 75MM BLUE (ENDOMECHANICALS) IMPLANT
RELOAD STAPLER BLUE 60MM (STAPLE) ×6 IMPLANT
RELOAD STAPLER GREEN 60MM (STAPLE) ×1 IMPLANT
RELOAD STAPLER WHITE 60MM (STAPLE) ×1 IMPLANT
SCISSORS LAP 5X35 DISP (ENDOMECHANICALS) IMPLANT
SET IRRIG TUBING LAPAROSCOPIC (IRRIGATION / IRRIGATOR) IMPLANT
SHEARS FOC LG CVD HARMONIC 17C (MISCELLANEOUS) ×2 IMPLANT
SLEEVE ENDOPATH XCEL 5M (ENDOMECHANICALS) ×4 IMPLANT
SLEEVE SUCTION CATH 165 (SLEEVE) ×2 IMPLANT
SPECIMEN JAR X LARGE (MISCELLANEOUS) ×2 IMPLANT
SPONGE LAP 18X36 RFD (DISPOSABLE) ×12 IMPLANT
STAPLE ECHEON FLEX 60 POW ENDO (STAPLE) ×2 IMPLANT
STAPLER ECHELON FLEX (STAPLE) IMPLANT
STAPLER PROXIMATE 75MM BLUE (STAPLE) IMPLANT
STAPLER RELOAD BLUE 60MM (STAPLE) ×12
STAPLER RELOAD GREEN 60MM (STAPLE) ×2
STAPLER RELOAD WHITE 60MM (STAPLE) ×2
STAPLER VISISTAT 35W (STAPLE) ×2 IMPLANT
STRIP PERI DRY FOR (MISCELLANEOUS) ×2 IMPLANT
STRIP PERI DRY VERITAS 60 (STAPLE) ×2 IMPLANT
SUCTION POOLE HANDLE (INSTRUMENTS) ×2
SUT ETHILON 2 0 FS 18 (SUTURE) ×6 IMPLANT
SUT MNCRL AB 4-0 PS2 18 (SUTURE) IMPLANT
SUT PDS AB 1 TP1 96 (SUTURE) ×4 IMPLANT
SUT PDS AB 3-0 SH 27 (SUTURE) ×8 IMPLANT
SUT PDS II 0 TP-1 LOOPED 60 (SUTURE) IMPLANT
SUT SILK 2 0 SH (SUTURE) ×2 IMPLANT
SUT SILK 2 0 SH CR/8 (SUTURE) ×2 IMPLANT
SUT SILK 2 0 TIES 10X30 (SUTURE) ×2 IMPLANT
SUT SILK 3 0 SH CR/8 (SUTURE) ×4 IMPLANT
SUT SILK 3 0 TIES 10X30 (SUTURE) ×2 IMPLANT
SUT SILK 3 0SH CR/8 30 (SUTURE) ×2 IMPLANT
SYR BULB IRRIGATION 50ML (SYRINGE) ×2 IMPLANT
TOWEL OR 17X24 6PK STRL BLUE (TOWEL DISPOSABLE) ×2 IMPLANT
TOWEL OR 17X26 10 PK STRL BLUE (TOWEL DISPOSABLE) ×2 IMPLANT
TRAY FOLEY MTR SLVR 14FR STAT (SET/KITS/TRAYS/PACK) IMPLANT
TRAY LAPAROSCOPIC MC (CUSTOM PROCEDURE TRAY) IMPLANT
TROCAR XCEL BLUNT TIP 100MML (ENDOMECHANICALS) IMPLANT
TROCAR XCEL NON-BLD 11X100MML (ENDOMECHANICALS) IMPLANT
TROCAR XCEL NON-BLD 5MMX100MML (ENDOMECHANICALS) ×4 IMPLANT
TUBING INSUFFLATION (TUBING) ×2 IMPLANT
TUNNELER SHEATH ON-Q 16GX12 DP (PAIN MANAGEMENT) ×2 IMPLANT
YANKAUER SUCT BULB TIP NO VENT (SUCTIONS) ×2 IMPLANT

## 2018-02-25 NOTE — Anesthesia Procedure Notes (Signed)
Procedure Name: Intubation Date/Time: 02/25/2018 7:51 AM Performed by: Carney Living, CRNA Pre-anesthesia Checklist: Patient identified, Emergency Drugs available, Suction available, Patient being monitored and Timeout performed Patient Re-evaluated:Patient Re-evaluated prior to induction Oxygen Delivery Method: Circle system utilized Preoxygenation: Pre-oxygenation with 100% oxygen Induction Type: IV induction Ventilation: Mask ventilation without difficulty Laryngoscope Size: Mac and 4 Grade View: Grade I Tube type: Oral Tube size: 7.5 mm Number of attempts: 1 Airway Equipment and Method: Stylet Placement Confirmation: ETT inserted through vocal cords under direct vision and breath sounds checked- equal and bilateral Secured at: 23 cm Tube secured with: Tape Dental Injury: Teeth and Oropharynx as per pre-operative assessment

## 2018-02-25 NOTE — Transfer of Care (Signed)
Immediate Anesthesia Transfer of Care Note  Patient: Ryan Collins  Procedure(s) Performed: LAPAROSCOPY DIAGNOSTIC (N/A Abdomen) TOTAL GASTRECTOMY WITH J TUBE PLACEMENT (N/A Abdomen) JEJUNOSTOMY TUBE (N/A Abdomen) SPLENECTOMY (N/A Abdomen) PANCREATECTOMY (N/A Abdomen)  Patient Location: PACU  Anesthesia Type:General  Level of Consciousness: awake and patient cooperative  Airway & Oxygen Therapy: Patient Spontanous Breathing and Patient connected to nasal cannula oxygen  Post-op Assessment: Report given to RN, Post -op Vital signs reviewed and stable and Patient moving all extremities X 4  Post vital signs: Reviewed and stable  Last Vitals:  Vitals Value Taken Time  BP 95/57 02/25/2018  1:02 PM  Temp    Pulse 79 02/25/2018  1:09 PM  Resp 17 02/25/2018  1:09 PM  SpO2 100 % 02/25/2018  1:09 PM  Vitals shown include unvalidated device data.  Last Pain: There were no vitals filed for this visit.       Complications: No apparent anesthesia complications

## 2018-02-25 NOTE — Interval H&P Note (Signed)
History and Physical Interval Note:  02/25/2018 7:32 AM  Ryan Collins  has presented today for surgery, with the diagnosis of GASTRIC CANCER  The various methods of treatment have been discussed with the patient and family. After consideration of risks, benefits and other options for treatment, the patient has consented to  Procedure(s): LAPAROSCOPY DIAGNOSTIC (N/A) TOTAL GASTRECTOMY WITH J TUBE PLACEMENT (N/A) JEJUNOSTOMY TUBE (N/A) as a surgical intervention .  The patient's history has been reviewed, patient examined, no change in status, stable for surgery.  I have reviewed the patient's chart and labs.  Questions were answered to the patient's satisfaction.     [name]

## 2018-02-25 NOTE — Progress Notes (Signed)
Spoke with Dr. Barry Dienes in regards to large amount of bloody output from [place]. Notified MD that patient is also diaphoretic and systolic blood pressure is in the 90s. Patient is not tachycardic and states pain is under control. CBC ordered as directed by Dr. Barry Dienes and 1 liter of LR given as bolus as directed. CBC resulted with hemoglobin dropping from 13.1 to 7.6. MD notified and order placed for patient to receive 1 unit PRBC. IV team at bedside now to access port-a-cath. Will administer blood after port-a-cath accessed.

## 2018-02-25 NOTE — Progress Notes (Signed)
Spoke with dr housed re: sbp 81-81, orders fro 1 unit albumin in pacu beofre transfer to icu

## 2018-02-25 NOTE — Op Note (Signed)
PRE-OPERATIVE DIAGNOSIS: gastric cancer, cT4N0M0  POST-OPERATIVE DIAGNOSIS:  Same  PROCEDURE:  Procedure(s): Diagnostic laparoscopy, liver biopsy, total gastrectomy with esophagojejunostomy en bloc with distal pancreatectomy/splenectomy, feeding jejunostomy tube  SURGEON:  Surgeon(s): [name], MD, FACS  ASSISTANT: Romana Juniper, MD  ANESTHESIA:   epidural and general  DRAINS: Jejunostomy Tube and (19 Fr) Blake drain(s) in the LUQ behind the esophagojejunostomy and tail of the pancreas   LOCAL MEDICATIONS USED:  BUPIVICAINE  and LIDOCAINE   SPECIMEN:  Source of Specimen:  liver biopsies, diaphragmatic biopsy, right liver biopsy, falciform biopsy (all negative by frozen) Total gastrectomy en bloc with distal pancreas and spleen  DISPOSITION OF SPECIMEN:  PATHOLOGY  COUNTS:  YES  DICTATION: .Dragon Dictation  PLAN OF CARE: Admit to inpatient   PATIENT DISPOSITION:  PACU - hemodynamically stable.  FINDINGS:   1.  Large posterior gastric tumor in the cardia/fundus densely adherent to distal pancreas, splenic vessels, and splenic hilum 2.  Indirect right inguinal hernia.    EBL: 300 mL  PROCEDURE:  Patient was identified in the holding area where he was taken to the operating room and placed supine on the operating room table.  General endotracheal anesthesia was induced.  A Foley catheter was placed, his arms were tucked, his abdomen was clipped, prepped, and draped in sterile fashion.  A timeout was performed according to the surgical safety checklist.  When all was correct, we continued.  The patient was placed into reverse Trendelenburg position and rotated to the right.  The left subcostal margin was anesthetized and a 5 mm Optiview trocar was placed under direct visualization.  There were several concerning locations seen on the left liver and the diaphragm.  A second 5 mm trocar was placed in the left upper quadrant and the laparoscopic biopsy forceps were used to  remove these areas for frozen section.  After these were off, the right side of the abdomen was examined with the scope.  Was also a concerning lesion on the right liver that was biopsied with a laparoscopic biopsy forceps and cautery.  Upon further look, there was a nodule on the falciform that was also biopsied.  These were sent for frozen section.  The pelvis was examined and there were no signs of drop metastases.  The frozen sections returned as all negative for malignancy.  A upper midline incision was made with a #10 blade.  The abdomen was examined digitally.  The incision was extended to just below the umbilicus.  The Bookwalter retractor was placed to assist with visualization.  The stomach was elevated and the mass was palpated posteriorly. The colon was taken off of the stomach with a combination of cautery and harmonic scalpel.  The lesser sac was quite fused until very high up.  There were several small areas of bleeding that were suture ligated.  The colon was painstakingly taken down from the stomach all the way up to the splenic flexure.  Once this was done, it was noted that the mass was adherent to the retroperitoneum.  It was not adherent to the aorta or centrally.  It did however appear to be adherent to the location where the superior border of the distal pancreas and the splenic vessels would be located.  The stomach was mobilized to just past the pylorus with the harmonic scalpel.  The spleen was rolled medially and the posterior attachments were taken down.  A Penrose drain was placed around the esophagus to assist with retraction.  Once this  was done, the retroperitoneum was opened as the spleen was rotated medially.  It was clear that the mass was adherent to the pancreas at that point.  The pancreas was mobilized and a green load of the Echelon stapler with Peri-Strips was used to fire across the spleen.  This was oversewn with a 2-0 silk suture.  The harmonic was used to continue the  dissection superiorly.  The left gastric pedicle was identified.  This was test clamped and the hepatic artery was palpated.  There was still an excellent pulse in the hilum of the liver.  The gastric pedicle was stapled with a vascular load of the Echelon stapler.  At that point, the stomach was freed up posteriorly.  The distal esophagus was mobilized in order to pull more down from the chest.  The sides of the esophagus were marked with long 3-0 silk sutures to prevent retraction into the chest.  A blue load was then used to divide the esophagus around 5-6 centimeters proximal to the tumor.  Another blue load was also used to divide the duodenum just past the pylorus.  The stomach en bloc with the spleen and distal pancreas were sent for frozen section of the proximal margin.  The small bowel was identified past the ligament of Treitz and it was noted that the transverse mesocolon had a defect.  This was closed significantly other than at the lowest part.  A roux limb was created  and the harmonic was used to divide just enough of the mesentery to be able to mobilize the small bowel superiorly.  The jejunojejunostomy was created with the blue load of the stapler as well.  The defect was closed with a 3-0 PDS in [place] fashion.  The duodenum was oversewn with a 3-0 PDS as well.  The frozen section had not returned yet, and the J-tube site was selected.  A pursestring suture was placed in the jejunum and this was opened with the cautery.  The J-tube was advanced through the abdominal wall and into the abdomen.  This was threaded into the jejunum through the pursestring suture.  The balloon was inflated with 3 cc of saline and the pursestring was tied down.  The tube was Witzeled with additional 3 -0 silks.  This was then pexed to the abdominal wall with 2-0 silks.    At this point, the frozen section returned as negative at the proximal margin.  The esophago-jejunostomy was then created.  The end of the  jejunum was secured posteriorly to the esophagus with the 3 -0 silk.  The distal esophagus was opened as well as the jejunal limb.  The anastomosis was created with a linear stapler.  The NG tube was threaded across the defect.  The defect was then closed with two 3-0 PDS in [place] fashion.  Stay sutures were placed with 3-0 silk on either side of the anastomosis.  There were several other anti-tension sutures placed on the medial aspect.  The defect in the transverse colon mesentery was examined and an additional suture was placed to decrease the size of this defect.  The abdomen was then irrigated with water.  The J-tube was secured with 2-0 nylons.  A drain was placed in the left upper quadrant and placed posterior to the esophagojejunostomy and just superior to the pancreatic tail.  This was secured with a 2-0 nylon as well.  Evicel was placed over the tail of the pancreas and across the esophageal anastomosis.  Counts were  performed and were correct.  The fascia was closed with #1 running looped PDS sutures x2.  The skin was irrigated and closed with skin staples.  Wounds were then cleaned, dried, and dressed with soft sterile dressings.  The patient was allowed to emerge from anesthesia and was taken to the PACU in stable condition.

## 2018-02-25 NOTE — Anesthesia Postprocedure Evaluation (Signed)
Anesthesia Post Note  Patient: Ryan Collins  Procedure(s) Performed: LAPAROSCOPY DIAGNOSTIC (N/A Abdomen) TOTAL GASTRECTOMY WITH J TUBE PLACEMENT (N/A Abdomen) JEJUNOSTOMY TUBE (N/A Abdomen) SPLENECTOMY (N/A Abdomen) PANCREATECTOMY (N/A Abdomen)     Patient location during evaluation: PACU Anesthesia Type: General Level of consciousness: awake and alert Pain management: pain level controlled Vital Signs Assessment: post-procedure vital signs reviewed and stable Respiratory status: spontaneous breathing, nonlabored ventilation, respiratory function stable and patient connected to nasal cannula oxygen Cardiovascular status: blood pressure returned to baseline and stable Postop Assessment: no apparent nausea or vomiting Anesthetic complications: no Comments: To floor w epidural     Last Vitals:  Vitals:   02/25/18 1617 02/25/18 1700  BP:  104/65  Pulse:  68  Resp: 12 18  Temp:  36.4 C  SpO2: 100% 100%    Last Pain:  Vitals:   02/25/18 1700  TempSrc: Oral  PainSc:                  Barnet Glasgow

## 2018-02-25 NOTE — Anesthesia Procedure Notes (Signed)
Epidural Patient location during procedure: holding area Start time: 02/25/2018 7:18 AM End time: 02/25/2018 7:28 AM  Staffing Anesthesiologist: Barnet Glasgow, MD Performed: anesthesiologist   Preanesthetic Checklist Completed: patient identified, site marked, surgical consent, pre-op evaluation, timeout performed, IV checked, risks and benefits discussed and monitors and equipment checked  Epidural Patient position: sitting Prep: ChloraPrep Patient monitoring: heart rate, continuous pulse ox and blood pressure Approach: midline Location: thoracic (1-12)  Needle:  Needle type: Hustead  Needle gauge: 18 G Needle length: 9 cm and 9 Needle insertion depth: 6 cm Catheter type: closed end flexible Catheter size: 20 Guage Catheter at skin depth: 11 cm Test dose: negative and 1.5% lidocaine  Additional Notes Test dose 1.5% Lidocaine with epi 1:200,000  Patient tolerated the insertion well without complications.Reason for block:post-op pain management

## 2018-02-26 ENCOUNTER — Other Ambulatory Visit: Payer: Self-pay

## 2018-02-26 ENCOUNTER — Encounter (HOSPITAL_COMMUNITY): Payer: Self-pay | Admitting: General Surgery

## 2018-02-26 LAB — CBC
HCT: 28 % — ABNORMAL LOW (ref 39.0–52.0)
HEMOGLOBIN: 9.3 g/dL — AB (ref 13.0–17.0)
MCH: 29.1 pg (ref 26.0–34.0)
MCHC: 33.2 g/dL (ref 30.0–36.0)
MCV: 87.5 fL (ref 80.0–100.0)
Platelets: 320 10*3/uL (ref 150–400)
RBC: 3.2 MIL/uL — ABNORMAL LOW (ref 4.22–5.81)
RDW: 16 % — ABNORMAL HIGH (ref 11.5–15.5)
WBC: 22.8 10*3/uL — AB (ref 4.0–10.5)
nRBC: 0 % (ref 0.0–0.2)

## 2018-02-26 LAB — TYPE AND SCREEN
ABO/RH(D): A POS
Antibody Screen: NEGATIVE
UNIT DIVISION: 0

## 2018-02-26 LAB — BPAM RBC
BLOOD PRODUCT EXPIRATION DATE: 201911042359
ISSUE DATE / TIME: 201910152344
UNIT TYPE AND RH: 6200

## 2018-02-26 LAB — BASIC METABOLIC PANEL
Anion gap: 6 (ref 5–15)
BUN: 8 mg/dL (ref 6–20)
CHLORIDE: 104 mmol/L (ref 98–111)
CO2: 24 mmol/L (ref 22–32)
CREATININE: 0.64 mg/dL (ref 0.61–1.24)
Calcium: 8.6 mg/dL — ABNORMAL LOW (ref 8.9–10.3)
GFR calc non Af Amer: >60 mL/min (ref >60)
Glucose, Bld: 136 mg/dL — ABNORMAL HIGH (ref 70–99)
Potassium: 4 mmol/L (ref 3.5–5.1)
Sodium: 134 mmol/L — ABNORMAL LOW (ref 135–145)

## 2018-02-26 LAB — PROTIME-INR
INR: 1.27
PROTHROMBIN TIME: 15.8 s — AB (ref 11.4–15.2)

## 2018-02-26 LAB — MAGNESIUM: Magnesium: 1.8 mg/dL (ref 1.7–2.4)

## 2018-02-26 NOTE — Progress Notes (Addendum)
1 Day Post-Op   Subjective/Chief Complaint: Had an episode of hypotension associated with a sudden increase in bloody drain output, around 300 mL.  Hgb 7.3, so 1 u prbcs given.  No other events.  Using PCA in addition to epidural.     Objective: Vital signs in last 24 hours: Temp:  [97.3 F (36.3 C)-98.8 F (37.1 C)] 98.2 F (36.8 C) (10/16 0400) Pulse Rate:  [66-96] 70 (10/16 0500) Resp:  [9-20] 15 (10/16 0500) BP: (81-125)/(52-80) 123/76 (10/16 0500) SpO2:  [97 %-100 %] 98 % (10/16 0500)    Intake/Output from previous day: 10/15 0701 - 10/16 0700 In: 7184.4 [I.V.:4738; Blood:346.7; IV Piggyback:2099.8] Out: 1224 [Urine:1045; Emesis/NG output:30; Drains:361; Blood:300] Intake/Output this shift: Total I/O In: 2571.1 [I.V.:1074.4; Blood:346.7; IV Piggyback:1150.1] Out: 700 [Urine:625; Drains:75]    General appearance: alert, cooperative and no significant distress Resp: breathing comfortably Cardio: regular rate and rhythm GI: soft, mildly distended, dressings c/d/i. Extremities: extremities normal, atraumatic, no cyanosis or edema  Lab Results:  Recent Labs    02/25/18 2157  WBC 20.6*  HGB 7.6*  HCT 23.7*  PLT 313   BMET No results for input(s): NA, K, CL, CO2, GLUCOSE, BUN, CREATININE, CALCIUM in the last 72 hours. PT/INR No results for input(s): LABPROT, INR in the last 72 hours. ABG No results for input(s): PHART, HCO3 in the last 72 hours.  Invalid input(s): PCO2, PO2  Studies/Results: No results found.  Anti-infectives: Anti-infectives (From admission, onward)   Start     Dose/Rate Route Frequency Ordered Stop   02/25/18 1500  ceFAZolin (ANCEF) IVPB 2g/100 mL premix     2 g 200 mL/hr over 30 Minutes Intravenous Every 8 hours 02/25/18 1451 02/25/18 1641   02/25/18 0630  ceFAZolin (ANCEF) IVPB 2g/100 mL premix     2 g 200 mL/hr over 30 Minutes Intravenous On call to O.R. 02/25/18 0627 02/25/18 1220   02/25/18 0630  ceFAZolin (ANCEF) 2-4 GM/100ML-%  IVPB    Note to Pharmacy:  Merryl Hacker   : cabinet override      02/25/18 0630 02/25/18 0806      Assessment/Plan: s/p Procedure(s): LAPAROSCOPY DIAGNOSTIC (N/A) TOTAL GASTRECTOMY WITH J TUBE PLACEMENT (N/A) JEJUNOSTOMY TUBE (N/A) SPLENECTOMY (N/A) PANCREATECTOMY (N/A) Continue foley due to epidural and urinary output monitoring.  NGT for total gastrectomy and esophagojejunostomy Epidural and PCA for pain control Ambulate as tolerated. Start low dose tube feeds tomorrow.   Anticipate upper GI on Friday AM to evaluate for leak.   Suspect leukocytosis from splenectomy. Will need splenectomy vaccines.      LOS: 1 day    [name] 02/26/2018

## 2018-02-26 NOTE — Progress Notes (Signed)
Initial Nutrition Assessment  DOCUMENTATION CODES:   Severe malnutrition in context of chronic illness  INTERVENTION:   RECOMMEND:   1.  Osmolite 1.5 to start at 15 ml/hr and increase slowly to goal rate of 55 ml/hr 2.  30 ml Prostat TID  Provides: 2180 kcal, 112 grams protein, and 1003 ml free water.   Recommend monitoring magnesium and phosphorus every 12 hours for 4 occurances, MD to replete as needed, as pt is at risk for refeeding syndrome given severe malnutrition.    NUTRITION DIAGNOSIS:   Severe Malnutrition related to cancer and cancer related treatments, chronic illness as evidenced by severe fat depletion, severe muscle depletion, energy intake < or equal to 75% for > or equal to 1 month, percent weight loss.  GOAL:   Patient will meet greater than or equal to 90% of their needs  MONITOR:   Diet advancement, TF tolerance, Labs  REASON FOR ASSESSMENT:   Consult Enteral/tube feeding initiation and management  ASSESSMENT:   Pt with PMH of HTN and daily ETOH use, who was dx with gastric cancer 05/2017 s/p chemo now admitted for surgery. Pt is s/p total gastrectomy with J tube placement.    Pt reports weight loss of 25% over 6 months. Weight loss started prior to cancer dx. He has been through chemo which contributed to weight loss.Pt was able to regain some weight while following up with cancer center RD.  Per pt he does not have much of an appetite and sometimes does not eat due to symptoms of bloating and pain.  24 hr recall:  Usually eats first meal between 9-12: eggs and grits or cereal Second meal is between 4-8 pm: sandwich or rice and gravy He drinks ensure when he can afford it.   Spoke with pt about starting nutrition through his J tube tomorrow. Questions answered. Pt seems anxious about hospitalization, reassured pt and notified RN.   JP drain: 401 ml x 24 hr NG for suction Surgically placed  J tube  Medications reviewed and include:  D5 1/2NS  with 20 mEq KCl/L @ 100 ml/hr Labs reviewed: Na 134 (L)    NUTRITION - FOCUSED PHYSICAL EXAM:    Most Recent Value  Orbital Region  Mild depletion  Upper Arm Region  Severe depletion  Thoracic and Lumbar Region  Severe depletion  Buccal Region  Mild depletion  Temple Region  Mild depletion  Clavicle Bone Region  Severe depletion  Clavicle and Acromion Bone Region  Severe depletion  Scapular Bone Region  Severe depletion  Dorsal Hand  Severe depletion  Patellar Region  Severe depletion  Anterior Thigh Region  Severe depletion  Posterior Calf Region  Moderate depletion  Edema (RD Assessment)  None  Hair  Unable to assess [no hair ]  Eyes  Reviewed  Mouth  Reviewed  Skin  Reviewed [hands darker from chemo per pt]  Nails  Reviewed [dark nail beds, from chemo per pt]       Diet Order:   Diet Order            Diet NPO time specified  Diet effective now              EDUCATION NEEDS:   Education needs have been addressed  Skin:  Skin Assessment: (abd incision)  Last BM:  unknown  Height:   Ht Readings from Last 1 Encounters:  02/18/18 5' 11.5" (1.816 m)    Weight:   Wt Readings from Last 1 Encounters:  02/18/18 68.3 kg    Ideal Body Weight:  79.5 kg  BMI:  There is no height or weight on file to calculate BMI.  Estimated Nutritional Needs:   Kcal:  2000-2300  Protein:  102-120 grams  Fluid:  > 2 L/day  Maylon Peppers RD, LDN, CNSC [phone] Pager [phone] After Hours Pager

## 2018-02-26 NOTE — Progress Notes (Signed)
Resting comfortably BP 120/60 P 60 SAO2 98% No pain with Thoracic Epidural infusion of 0.2% Ropivicaine @ 5cc/hr Plan is for epidural catheter to stay in two more days. Will check status on 10/17.

## 2018-02-27 DIAGNOSIS — E43 Unspecified severe protein-calorie malnutrition: Secondary | ICD-10-CM

## 2018-02-27 LAB — BASIC METABOLIC PANEL
ANION GAP: 6 (ref 5–15)
BUN: <5 mg/dL — ABNORMAL LOW (ref 6–20)
CHLORIDE: 102 mmol/L (ref 98–111)
CO2: 25 mmol/L (ref 22–32)
Calcium: 8.3 mg/dL — ABNORMAL LOW (ref 8.9–10.3)
Creatinine, Ser: 0.56 mg/dL — ABNORMAL LOW (ref 0.61–1.24)
GFR calc Af Amer: >60 mL/min (ref >60)
Glucose, Bld: 108 mg/dL — ABNORMAL HIGH (ref 70–99)
POTASSIUM: 3.6 mmol/L (ref 3.5–5.1)
SODIUM: 133 mmol/L — AB (ref 135–145)

## 2018-02-27 LAB — CBC
HCT: 27.9 % — ABNORMAL LOW (ref 39.0–52.0)
Hemoglobin: 9.2 g/dL — ABNORMAL LOW (ref 13.0–17.0)
MCH: 28.9 pg (ref 26.0–34.0)
MCHC: 33 g/dL (ref 30.0–36.0)
MCV: 87.7 fL (ref 80.0–100.0)
NRBC: 0 % (ref 0.0–0.2)
Platelets: 346 10*3/uL (ref 150–400)
RBC: 3.18 MIL/uL — AB (ref 4.22–5.81)
RDW: 16.5 % — ABNORMAL HIGH (ref 11.5–15.5)
WBC: 19.2 10*3/uL — ABNORMAL HIGH (ref 4.0–10.5)

## 2018-02-27 LAB — MAGNESIUM: MAGNESIUM: 1.8 mg/dL (ref 1.7–2.4)

## 2018-02-27 MED ORDER — ACETAMINOPHEN 10 MG/ML IV SOLN
1000.0000 mg | Freq: Four times a day (QID) | INTRAVENOUS | Status: AC
  Administered 2018-02-27 – 2018-02-28 (×4): 1000 mg via INTRAVENOUS
  Filled 2018-02-27 (×4): qty 100

## 2018-02-27 MED ORDER — OSMOLITE 1.5 CAL PO LIQD
1000.0000 mL | ORAL | Status: DC
  Administered 2018-02-27: 1000 mL
  Filled 2018-02-27 (×2): qty 1000

## 2018-02-27 NOTE — Progress Notes (Addendum)
2 Days Post-Op   Subjective/Chief Complaint: No more hypotension.  Still feeling quite sore.  No flatus yet.  No n/v.    Objective: Vital signs in last 24 hours: Temp:  [98.1 F (36.7 C)-99.1 F (37.3 C)] 98.7 F (37.1 C) (10/17 0851) Pulse Rate:  [56-87] 78 (10/17 1200) Resp:  [10-26] 12 (10/17 1200) BP: (115-151)/(60-102) 122/88 (10/17 1200) SpO2:  [92 %-100 %] 98 % (10/17 1200)    Intake/Output from previous day: 10/16 0701 - 10/17 0700 In: 2221.4 [I.V.:1888.7; IV Piggyback:100] Out: 2395 [Urine:1900; Emesis/NG output:150; Drains:345] Intake/Output this shift: Total I/O In: 425 [I.V.:350; Other:25; IV Piggyback:50] Out: -     General appearance: sleeping, arousable. Oriented Resp: breathing comfortably Cardio: regular rate and rhythm GI: soft, non distended, dressings c/d/i. Extremities: extremities normal, atraumatic, no cyanosis or edema  Lab Results:  Recent Labs    02/26/18 0615 02/27/18 0542  WBC 22.8* 19.2*  HGB 9.3* 9.2*  HCT 28.0* 27.9*  PLT 320 346   BMET Recent Labs    02/26/18 0615 02/27/18 0542  NA 134* 133*  K 4.0 3.6  CL 104 102  CO2 24 25  GLUCOSE 136* 108*  BUN 8 <5*  CREATININE 0.64 0.56*  CALCIUM 8.6* 8.3*   PT/INR Recent Labs    02/26/18 0615  LABPROT 15.8*  INR 1.27   ABG No results for input(s): PHART, HCO3 in the last 72 hours.  Invalid input(s): PCO2, PO2  Studies/Results: No results found.  Anti-infectives: Anti-infectives (From admission, onward)   Start     Dose/Rate Route Frequency Ordered Stop   02/25/18 1500  ceFAZolin (ANCEF) IVPB 2g/100 mL premix     2 g 200 mL/hr over 30 Minutes Intravenous Every 8 hours 02/25/18 1451 02/25/18 1641   02/25/18 0630  ceFAZolin (ANCEF) IVPB 2g/100 mL premix     2 g 200 mL/hr over 30 Minutes Intravenous On call to O.R. 02/25/18 0627 02/25/18 1220   02/25/18 0630  ceFAZolin (ANCEF) 2-4 GM/100ML-% IVPB    Note to Pharmacy:  Merryl Hacker   : cabinet override   02/25/18 0630 02/25/18 0806      Assessment/Plan: s/p Procedure(s): LAPAROSCOPY DIAGNOSTIC (N/A) TOTAL GASTRECTOMY WITH J TUBE PLACEMENT (N/A) JEJUNOSTOMY TUBE (N/A) SPLENECTOMY (N/A) PANCREATECTOMY (N/A) Continue foley due to epidural and urinary output monitoring.  NGT for total gastrectomy and esophagojejunostomy Epidural and PCA for pain control OOB today Start low dose tube feeds today Anticipate upper GI tomorrow AM to evaluate for leak.   Suspect leukocytosis from splenectomy.  Improving.  Transfer to 4 np if bed available.    Will need splenectomy vaccines.      LOS: 2 days    [name] 02/27/2018

## 2018-02-27 NOTE — Addendum Note (Signed)
Addendum  created 02/27/18 1044 by Montez Hageman, MD   Sign clinical note

## 2018-02-27 NOTE — Progress Notes (Signed)
S: Comfortable, no complaints O: Vitals reviewed. Epidural site clean,dry,intact. A: Continue epidural at current settings. P: Consider d/c of epidural tomorrow.

## 2018-02-28 ENCOUNTER — Inpatient Hospital Stay (HOSPITAL_COMMUNITY): Payer: Medicaid Other

## 2018-02-28 LAB — CBC
HCT: 27.3 % — ABNORMAL LOW (ref 39.0–52.0)
HEMOGLOBIN: 8.8 g/dL — AB (ref 13.0–17.0)
MCH: 28.5 pg (ref 26.0–34.0)
MCHC: 32.2 g/dL (ref 30.0–36.0)
MCV: 88.3 fL (ref 80.0–100.0)
PLATELETS: 344 10*3/uL (ref 150–400)
RBC: 3.09 MIL/uL — AB (ref 4.22–5.81)
RDW: 16 % — ABNORMAL HIGH (ref 11.5–15.5)
WBC: 13.2 10*3/uL — ABNORMAL HIGH (ref 4.0–10.5)
nRBC: 0 % (ref 0.0–0.2)

## 2018-02-28 LAB — BASIC METABOLIC PANEL
Anion gap: 6 (ref 5–15)
BUN: <5 mg/dL — ABNORMAL LOW (ref 6–20)
CHLORIDE: 101 mmol/L (ref 98–111)
CO2: 25 mmol/L (ref 22–32)
CREATININE: 0.68 mg/dL (ref 0.61–1.24)
Calcium: 8.4 mg/dL — ABNORMAL LOW (ref 8.9–10.3)
Glucose, Bld: 97 mg/dL (ref 70–99)
Potassium: 3.5 mmol/L (ref 3.5–5.1)
Sodium: 132 mmol/L — ABNORMAL LOW (ref 135–145)

## 2018-02-28 LAB — MAGNESIUM: Magnesium: 1.9 mg/dL (ref 1.7–2.4)

## 2018-02-28 MED ORDER — OSMOLITE 1.5 CAL PO LIQD
1000.0000 mL | ORAL | Status: DC
  Administered 2018-02-28 – 2018-03-01 (×2): 1000 mL
  Filled 2018-02-28 (×2): qty 1000

## 2018-02-28 MED ORDER — IOHEXOL 350 MG/ML SOLN
300.0000 mL | Freq: Once | INTRAVENOUS | Status: AC | PRN
  Administered 2018-02-28: 175 mL via ORAL

## 2018-02-28 NOTE — Progress Notes (Signed)
Initial Nutrition Assessment  DOCUMENTATION CODES:   Severe malnutrition in context of chronic illness  INTERVENTION:   RECOMMEND:   1.  Osmolite 1.5, increase slowly to goal rate of 55 ml/hr 2.  30 ml Prostat TID  Provides: 2180 kcal, 112 grams protein, and 1003 ml free water.   Recommend monitoring magnesium and phosphorus every 12 hours for 4 occurances, MD to replete as needed, as pt is at risk for refeeding syndrome given severe malnutrition.   Daily weights  NUTRITION DIAGNOSIS:   Severe Malnutrition related to cancer and cancer related treatments, chronic illness as evidenced by severe fat depletion, severe muscle depletion, energy intake < or equal to 75% for > or equal to 1 month, percent weight loss. Ongoing.   GOAL:   Patient will meet greater than or equal to 90% of their needs Progressing  MONITOR:   Diet advancement, TF tolerance, Labs  ASSESSMENT:   Pt with PMH of HTN and daily ETOH use, who was dx with gastric cancer 05/2017 s/p chemo now admitted for surgery. Pt is s/p total gastrectomy with J tube placement.    10/17 TF started @ 10 ml/hr 10/18 TF advanced to 20 ml/hr, no flatus or BM yet, NG tube out and pt tolerating  JP drain: 110 ml x 24 hr NG out Surgically placed  J tube  Medications reviewed and include:  D5 1/2NS with 20 mEq KCl/L @ 60 ml/hr Labs reviewed: Na 132 (L), K+ WNL, Magnesium WNL    Diet Order:   Diet Order            Diet NPO time specified  Diet effective now              EDUCATION NEEDS:   Education needs have been addressed  Skin:  Skin Assessment: (abd incision)  Last BM:  unknown  Height:   Ht Readings from Last 1 Encounters:  02/18/18 5' 11.5" (1.816 m)    Weight:   Wt Readings from Last 1 Encounters:  02/18/18 68.3 kg    Ideal Body Weight:  79.5 kg  BMI:  There is no height or weight on file to calculate BMI.  Estimated Nutritional Needs:   Kcal:  2000-2300  Protein:  102-120  grams  Fluid:  > 2 L/day  Maylon Peppers RD, LDN, CNSC [phone] Pager [phone] After Hours Pager

## 2018-02-28 NOTE — Progress Notes (Signed)
S: Comfortable, no complaints O: Vitals reviewed. Epidural site clean,dry,intact. A: Continue epidural at current settings. P: Consider d/c of epidural tomorrow.

## 2018-02-28 NOTE — Progress Notes (Signed)
Paged trauma for CCS coverage, patient pulled NG out by accident.   MD said to leave it out for now and to let dayshift know.   Omelia Blackwater RN, BSN

## 2018-02-28 NOTE — Progress Notes (Signed)
3 Days Post-Op   Subjective/Chief Complaint: ngt "came out" overnight.  No n/v.  No flatus.  Tolerating 10 ml/hr tube feeds.  Foley still not draining well.  Was OOB in chair yesterday.  Objective: Vital signs in last 24 hours: Temp:  [98.8 F (37.1 C)-99.3 F (37.4 C)] 98.8 F (37.1 C) (10/18 0000) Pulse Rate:  [68-96] 78 (10/18 0300) Resp:  [9-25] 12 (10/18 0621) BP: (98-151)/(59-94) 98/70 (10/18 0300) SpO2:  [94 %-100 %] 100 % (10/18 0621)    Intake/Output from previous day: 10/17 0701 - 10/18 0700 In: 2448.6 [I.V.:1715.2; NG/GT:94; IV Piggyback:539.3] Out: 5310 [Urine:4950; Emesis/NG output:250; Drains:110] Intake/Output this shift: No intake/output data recorded.    General appearance: A&O x 3 Resp: breathing comfortably Cardio: regular rate and rhythm GI: soft, non distended, dressings c/d/i. Extremities: extremities normal, atraumatic, no cyanosis or edema  Lab Results:  Recent Labs    02/27/18 0542 02/28/18 0352  WBC 19.2* 13.2*  HGB 9.2* 8.8*  HCT 27.9* 27.3*  PLT 346 344   BMET Recent Labs    02/27/18 0542 02/28/18 0352  NA 133* 132*  K 3.6 3.5  CL 102 101  CO2 25 25  GLUCOSE 108* 97  BUN <5* <5*  CREATININE 0.56* 0.68  CALCIUM 8.3* 8.4*   PT/INR Recent Labs    02/26/18 0615  LABPROT 15.8*  INR 1.27   ABG No results for input(s): PHART, HCO3 in the last 72 hours.  Invalid input(s): PCO2, PO2  Studies/Results: No results found.  Anti-infectives: Anti-infectives (From admission, onward)   Start     Dose/Rate Route Frequency Ordered Stop   02/25/18 1500  ceFAZolin (ANCEF) IVPB 2g/100 mL premix     2 g 200 mL/hr over 30 Minutes Intravenous Every 8 hours 02/25/18 1451 02/25/18 1641   02/25/18 0630  ceFAZolin (ANCEF) IVPB 2g/100 mL premix     2 g 200 mL/hr over 30 Minutes Intravenous On call to O.R. 02/25/18 0627 02/25/18 1220   02/25/18 0630  ceFAZolin (ANCEF) 2-4 GM/100ML-% IVPB    Note to Pharmacy:  Merryl Hacker   : cabinet  override      02/25/18 0630 02/25/18 0806      Assessment/Plan: s/p Procedure(s): LAPAROSCOPY DIAGNOSTIC (N/A) TOTAL GASTRECTOMY WITH J TUBE PLACEMENT (N/A) JEJUNOSTOMY TUBE (N/A) SPLENECTOMY (N/A) PANCREATECTOMY (N/A) Continue foley due to epidural and urinary output monitoring.  UGI today to eval for possible leak prior to oral intake.   Epidural and PCA for pain control OOB today Increase tube feeds.  Await flatus prior to ramp to goal.    Suspect leukocytosis from splenectomy.  Improving.  Transfer to 4 np if bed available.    Will need splenectomy vaccines.      LOS: 3 days    [name] 02/28/2018

## 2018-03-01 ENCOUNTER — Encounter (HOSPITAL_COMMUNITY): Payer: Self-pay | Admitting: Anesthesiology

## 2018-03-01 LAB — CBC
HCT: 26.3 % — ABNORMAL LOW (ref 39.0–52.0)
Hemoglobin: 8.5 g/dL — ABNORMAL LOW (ref 13.0–17.0)
MCH: 28.4 pg (ref 26.0–34.0)
MCHC: 32.3 g/dL (ref 30.0–36.0)
MCV: 88 fL (ref 80.0–100.0)
PLATELETS: 375 10*3/uL (ref 150–400)
RBC: 2.99 MIL/uL — AB (ref 4.22–5.81)
RDW: 15.8 % — ABNORMAL HIGH (ref 11.5–15.5)
WBC: 11.2 10*3/uL — AB (ref 4.0–10.5)
nRBC: 0 % (ref 0.0–0.2)

## 2018-03-01 LAB — BASIC METABOLIC PANEL
ANION GAP: 6 (ref 5–15)
BUN: <5 mg/dL — ABNORMAL LOW (ref 6–20)
CALCIUM: 8.5 mg/dL — AB (ref 8.9–10.3)
CO2: 26 mmol/L (ref 22–32)
Chloride: 103 mmol/L (ref 98–111)
Creatinine, Ser: 0.59 mg/dL — ABNORMAL LOW (ref 0.61–1.24)
GLUCOSE: 98 mg/dL (ref 70–99)
Potassium: 4 mmol/L (ref 3.5–5.1)
SODIUM: 135 mmol/L (ref 135–145)

## 2018-03-01 LAB — AMYLASE: AMYLASE: 65 U/L (ref 28–100)

## 2018-03-01 MED ORDER — OSMOLITE 1.5 CAL PO LIQD
1000.0000 mL | ORAL | Status: DC
  Administered 2018-03-02 – 2018-03-03 (×2): 1000 mL
  Filled 2018-03-01 (×3): qty 1000

## 2018-03-01 NOTE — Progress Notes (Addendum)
4 Days Post-Op   Subjective/Chief Complaint: UGI showed no leak.  No other overnight events.    Objective: Vital signs in last 24 hours: Temp:  [98.1 F (36.7 C)-99 F (37.2 C)] 99 F (37.2 C) (10/19 1211) Pulse Rate:  [68-98] 77 (10/19 1211) Resp:  [8-18] 11 (10/19 1211) BP: (102-138)/(63-90) 115/79 (10/19 1211) SpO2:  [96 %-100 %] 100 % (10/19 1211) Weight:  [69.3 kg] 69.3 kg (10/19 0500)    Intake/Output from previous day: 10/18 0701 - 10/19 0700 In: 1192.7 [I.V.:720; NG/GT:372.7; IV Piggyback:100] Out: 2470 [Urine:2375; Drains:95] Intake/Output this shift: No intake/output data recorded.   General appearance: A&O x 3 Resp: breathing comfortably Cardio: regular rate and rhythm GI: soft, non distended, dressings c/d/i. Drain serosang. Extremities: extremities normal, atraumatic, no cyanosis or edema  Lab Results:  Recent Labs    02/28/18 0352 03/01/18 0500  WBC 13.2* 11.2*  HGB 8.8* 8.5*  HCT 27.3* 26.3*  PLT 344 375   BMET Recent Labs    02/28/18 0352 03/01/18 0500  NA 132* 135  K 3.5 4.0  CL 101 103  CO2 25 26  GLUCOSE 97 98  BUN <5* <5*  CREATININE 0.68 0.59*  CALCIUM 8.4* 8.5*   PT/INR No results for input(s): LABPROT, INR in the last 72 hours. ABG No results for input(s): PHART, HCO3 in the last 72 hours.  Invalid input(s): PCO2, PO2  Studies/Results: Dg Ugi W/water Sol Cm  Result Date: 02/28/2018 CLINICAL DATA:  Post gastrectomy for gastric cancer. EXAM: WATER SOLUBLE UPPER GI SERIES TECHNIQUE: Single-column upper GI series was performed using water soluble contrast. CONTRAST:  175 cc Omnipaque 300 was taken orally. COMPARISON:  Preoperative CT dated 02/12/2018 FLUOROSCOPY TIME:  Fluoroscopy Time:  54 seconds Number of Acquired Spot Images: 8 FINDINGS: There has been a prior gastrectomy with esophagojejunostomy. Initial radiograph demonstrates no abnormally dilated small bowel loops. Colonic gas is present. Opacification of the distal  esophagus and proximal jejunum was demonstrated with continues drinking. The visualized small bowel loops are normal in caliber. No extraluminal contrast was seen. Rapid passage of contrast through the small bowel was witnessed. IMPRESSION: Post prior gastrectomy with esophagojejunostomy, with no evidence of contrast extravasation. Normal caliber and motility of the visualized small bowel loops. Electronically Signed   By: Fidela Salisbury M.D.   On: 02/28/2018 16:25    Anti-infectives: Anti-infectives (From admission, onward)   Start     Dose/Rate Route Frequency Ordered Stop   02/25/18 1500  ceFAZolin (ANCEF) IVPB 2g/100 mL premix     2 g 200 mL/hr over 30 Minutes Intravenous Every 8 hours 02/25/18 1451 02/25/18 1641   02/25/18 0630  ceFAZolin (ANCEF) IVPB 2g/100 mL premix     2 g 200 mL/hr over 30 Minutes Intravenous On call to O.R. 02/25/18 0627 02/25/18 1220   02/25/18 0630  ceFAZolin (ANCEF) 2-4 GM/100ML-% IVPB    Note to Pharmacy:  Merryl Hacker   : cabinet override      02/25/18 0630 02/25/18 0806      Assessment/Plan: s/p Procedure(s): LAPAROSCOPY DIAGNOSTIC (N/A) TOTAL GASTRECTOMY WITH J TUBE PLACEMENT (N/A) JEJUNOSTOMY TUBE (N/A) SPLENECTOMY (N/A) PANCREATECTOMY (N/A)    UGI today to eval for possible leak prior to oral intake.   Pull epidural.  Continue PCA. Add oxycodone.  OOB today Clears po. Increase tube feeds.    Transfer to floor tomorrow.     Will need splenectomy vaccines.      LOS: 4 days    [name] 03/01/2018

## 2018-03-01 NOTE — Anesthesia Post-op Follow-up Note (Signed)
  Anesthesia Pain Follow-up Note  Patient: Ryan Collins  Day #: 4  Date of Follow-up: 03/01/2018 Time: 10:26 AM  Last Vitals:  Vitals:   03/01/18 0403 03/01/18 0839  BP: 104/63 102/66  Pulse: 76 68  Resp:  18  Temp: 37.1 C 36.9 C  SpO2: 100% 100%    Level of Consciousness: alert  Pain: 2 /10   Side Effects:Pruritis  Catheter Site Exam:clean, dry  Epidural / Intrathecal (From admission, onward)   Start     Dose/Rate Route Frequency Ordered Stop   02/25/18 1330  ropivacaine (PF) 2 mg/mL (0.2%) (NAROPIN) injection     5 mL/hr 5 mL/hr  Epidural Continuous 02/25/18 1315 03/02/18 1329       Plan: D/C from anesthesia care at surgeon's request  Effie Berkshire

## 2018-03-02 LAB — BASIC METABOLIC PANEL
Anion gap: 5 (ref 5–15)
CHLORIDE: 103 mmol/L (ref 98–111)
CO2: 27 mmol/L (ref 22–32)
CREATININE: 0.57 mg/dL — AB (ref 0.61–1.24)
Calcium: 8.5 mg/dL — ABNORMAL LOW (ref 8.9–10.3)
GFR calc Af Amer: >60 mL/min (ref >60)
GFR calc non Af Amer: >60 mL/min (ref >60)
Glucose, Bld: 113 mg/dL — ABNORMAL HIGH (ref 70–99)
POTASSIUM: 4 mmol/L (ref 3.5–5.1)
SODIUM: 135 mmol/L (ref 135–145)

## 2018-03-02 LAB — CBC
HEMATOCRIT: 28 % — AB (ref 39.0–52.0)
Hemoglobin: 9 g/dL — ABNORMAL LOW (ref 13.0–17.0)
MCH: 28 pg (ref 26.0–34.0)
MCHC: 32.1 g/dL (ref 30.0–36.0)
MCV: 87.2 fL (ref 80.0–100.0)
Platelets: 420 10*3/uL — ABNORMAL HIGH (ref 150–400)
RBC: 3.21 MIL/uL — AB (ref 4.22–5.81)
RDW: 15.5 % (ref 11.5–15.5)
WBC: 9.7 10*3/uL (ref 4.0–10.5)
nRBC: 0 % (ref 0.0–0.2)

## 2018-03-02 NOTE — Progress Notes (Signed)
Patient ID: Ryan Collins, male   DOB: [birth year], 57 y.o.   MRN: [id number] 5 Days Post-Op   Subjective: No complaints this morning.  Tolerating clear liquid diet without nausea or pain.  Has been ambulatory.  Objective: Vital signs in last 24 hours: Temp:  [98.4 F (36.9 C)-99.4 F (37.4 C)] 98.7 F (37.1 C) (10/20 0335) Pulse Rate:  [67-77] 73 (10/20 0335) Resp:  [11-20] 12 (10/20 0349) BP: (115-129)/(67-90) 115/67 (10/20 0335) SpO2:  [97 %-100 %] 100 % (10/20 0349) Weight:  [67.3 kg] 67.3 kg (10/20 0550)    Intake/Output from previous day: 10/19 0701 - 10/20 0700 In: 2745.3 [P.O.:480; I.V.:1296.4; NG/GT:650.2; IV Piggyback:150] Out: 2835 [Urine:2675; Drains:160] Intake/Output this shift: Total I/O In: -  Out: 100 [Urine:100]  General appearance: alert, cooperative and no distress GI: normal findings: soft, non-tender and Nondistended Incision/Wound: Dressings clean and dry  Lab Results:  Recent Labs    03/01/18 0500 03/02/18 0344  WBC 11.2* 9.7  HGB 8.5* 9.0*  HCT 26.3* 28.0*  PLT 375 420*   BMET Recent Labs    03/01/18 0500 03/02/18 0344  NA 135 135  K 4.0 4.0  CL 103 103  CO2 26 27  GLUCOSE 98 113*  BUN <5* <5*  CREATININE 0.59* 0.57*  CALCIUM 8.5* 8.5*     Studies/Results: Dg Ugi W/water Sol Cm  Result Date: 02/28/2018 CLINICAL DATA:  Post gastrectomy for gastric cancer. EXAM: WATER SOLUBLE UPPER GI SERIES TECHNIQUE: Single-column upper GI series was performed using water soluble contrast. CONTRAST:  175 cc Omnipaque 300 was taken orally. COMPARISON:  Preoperative CT dated 02/12/2018 FLUOROSCOPY TIME:  Fluoroscopy Time:  54 seconds Number of Acquired Spot Images: 8 FINDINGS: There has been a prior gastrectomy with esophagojejunostomy. Initial radiograph demonstrates no abnormally dilated small bowel loops. Colonic gas is present. Opacification of the distal esophagus and proximal jejunum was demonstrated with continues drinking. The visualized small  bowel loops are normal in caliber. No extraluminal contrast was seen. Rapid passage of contrast through the small bowel was witnessed. IMPRESSION: Post prior gastrectomy with esophagojejunostomy, with no evidence of contrast extravasation. Normal caliber and motility of the visualized small bowel loops. Electronically Signed   By: Fidela Salisbury M.D.   On: 02/28/2018 16:25    Anti-infectives: Anti-infectives (From admission, onward)   Start     Dose/Rate Route Frequency Ordered Stop   02/25/18 1500  ceFAZolin (ANCEF) IVPB 2g/100 mL premix     2 g 200 mL/hr over 30 Minutes Intravenous Every 8 hours 02/25/18 1451 02/25/18 1641   02/25/18 0630  ceFAZolin (ANCEF) IVPB 2g/100 mL premix     2 g 200 mL/hr over 30 Minutes Intravenous On call to O.R. 02/25/18 0627 02/25/18 1220   02/25/18 0630  ceFAZolin (ANCEF) 2-4 GM/100ML-% IVPB    Note to Pharmacy:  Merryl Hacker   : cabinet override      02/25/18 0630 02/25/18 0806      Assessment/Plan: s/p Procedure(s): LAPAROSCOPY DIAGNOSTIC TOTAL GASTRECTOMY WITH J TUBE PLACEMENT JEJUNOSTOMY TUBE SPLENECTOMY PANCREATECTOMY Doing well without apparent complication.  Diet advanced to full liquids.   LOS: 5 days    Daran Jolly 03/02/2018

## 2018-03-03 MED ORDER — OXYCODONE HCL 5 MG/5ML PO SOLN
5.0000 mg | ORAL | Status: DC | PRN
  Administered 2018-03-05 (×2): 10 mg via ORAL
  Administered 2018-03-05: 5 mg via ORAL
  Administered 2018-03-05 – 2018-03-06 (×2): 10 mg via ORAL
  Filled 2018-03-03 (×4): qty 10
  Filled 2018-03-03: qty 5

## 2018-03-03 MED ORDER — OSMOLITE 1.5 CAL PO LIQD
1000.0000 mL | ORAL | Status: DC
  Administered 2018-03-04: 1000 mL
  Filled 2018-03-03 (×3): qty 1000

## 2018-03-03 MED ORDER — POLYETHYLENE GLYCOL 3350 17 G PO PACK
17.0000 g | PACK | Freq: Two times a day (BID) | ORAL | Status: DC
  Administered 2018-03-03 – 2018-03-06 (×4): 17 g via ORAL
  Filled 2018-03-03 (×6): qty 1

## 2018-03-03 MED ORDER — HYDROMORPHONE HCL 1 MG/ML IJ SOLN: 0.5000 mg | INTRAMUSCULAR | Status: DC | PRN

## 2018-03-03 NOTE — Progress Notes (Signed)
Patient ID: Ryan Collins, male   DOB: [birth year], 57 y.o.   MRN: [id number] 6 Days Post-Op   Subjective: Pain tolerable.  Tolerating full liquids.  No bm yet.  Objective: Vital signs in last 24 hours: Temp:  [98.7 F (37.1 C)-99.8 F (37.7 C)] 98.9 F (37.2 C) (10/21 1222) Pulse Rate:  [70-102] 76 (10/21 1222) Resp:  [12-21] 16 (10/21 1222) BP: (115-123)/(72-88) 119/82 (10/21 1222) SpO2:  [93 %-100 %] 100 % (10/21 1222) Weight:  [66.7 kg] 66.7 kg (10/21 0333)    Intake/Output from previous day: 10/20 0701 - 10/21 0700 In: 100 [IV Piggyback:100] Out: 1000 [Urine:850; Drains:150] Intake/Output this shift: Total I/O In: 2240.4 [I.V.:1750.3; Other:30; IV Piggyback:460.1] Out: 55 [Drains:55]  General appearance: alert, cooperative and no distress GI: normal findings: soft, non-tender and Nondistended Incision/Wound: Dressings clean and dry  Lab Results:  Recent Labs    03/01/18 0500 03/02/18 0344  WBC 11.2* 9.7  HGB 8.5* 9.0*  HCT 26.3* 28.0*  PLT 375 420*   BMET Recent Labs    03/01/18 0500 03/02/18 0344  NA 135 135  K 4.0 4.0  CL 103 103  CO2 26 27  GLUCOSE 98 113*  BUN <5* <5*  CREATININE 0.59* 0.57*  CALCIUM 8.5* 8.5*     Studies/Results: No results found.  Anti-infectives: Anti-infectives (From admission, onward)   Start     Dose/Rate Route Frequency Ordered Stop   02/25/18 1500  ceFAZolin (ANCEF) IVPB 2g/100 mL premix     2 g 200 mL/hr over 30 Minutes Intravenous Every 8 hours 02/25/18 1451 02/25/18 1641   02/25/18 0630  ceFAZolin (ANCEF) IVPB 2g/100 mL premix     2 g 200 mL/hr over 30 Minutes Intravenous On call to O.R. 02/25/18 0627 02/25/18 1220   02/25/18 0630  ceFAZolin (ANCEF) 2-4 GM/100ML-% IVPB    Note to Pharmacy:  Merryl Hacker   : cabinet override      02/25/18 0630 02/25/18 0806      Assessment/Plan: s/p Procedure(s): LAPAROSCOPY DIAGNOSTIC TOTAL GASTRECTOMY WITH J TUBE PLACEMENT JEJUNOSTOMY  TUBE SPLENECTOMY PANCREATECTOMY Advance tube feeds to goal.   Work on discharge planning.     LOS: 6 days    [name] 03/03/2018

## 2018-03-03 NOTE — Progress Notes (Signed)
Dilaudid 64mL wasted in secure narcotic disposal.  Witnessed by Viann Fish, RN.

## 2018-03-04 DIAGNOSIS — Z903 Acquired absence of stomach [part of]: Secondary | ICD-10-CM

## 2018-03-04 DIAGNOSIS — Z980 Intestinal bypass and anastomosis status: Secondary | ICD-10-CM

## 2018-03-04 DIAGNOSIS — Z934 Other artificial openings of gastrointestinal tract status: Secondary | ICD-10-CM

## 2018-03-04 LAB — BASIC METABOLIC PANEL
ANION GAP: 8 (ref 5–15)
BUN: 7 mg/dL (ref 6–20)
CALCIUM: 8.8 mg/dL — AB (ref 8.9–10.3)
CO2: 25 mmol/L (ref 22–32)
Chloride: 103 mmol/L (ref 98–111)
Creatinine, Ser: 0.63 mg/dL (ref 0.61–1.24)
GFR calc Af Amer: >60 mL/min (ref >60)
GFR calc non Af Amer: >60 mL/min (ref >60)
GLUCOSE: 126 mg/dL — AB (ref 70–99)
Potassium: 4 mmol/L (ref 3.5–5.1)
Sodium: 136 mmol/L (ref 135–145)

## 2018-03-04 LAB — AMYLASE, PLEURAL OR PERITONEAL FLUID: Amylase, Fluid: 1500 U/L

## 2018-03-04 LAB — CBC
HCT: 29.1 % — ABNORMAL LOW (ref 39.0–52.0)
Hemoglobin: 9.6 g/dL — ABNORMAL LOW (ref 13.0–17.0)
MCH: 28.4 pg (ref 26.0–34.0)
MCHC: 33 g/dL (ref 30.0–36.0)
MCV: 86.1 fL (ref 80.0–100.0)
NRBC: 0 % (ref 0.0–0.2)
Platelets: 565 10*3/uL — ABNORMAL HIGH (ref 150–400)
RBC: 3.38 MIL/uL — ABNORMAL LOW (ref 4.22–5.81)
RDW: 15.4 % (ref 11.5–15.5)
WBC: 10.3 10*3/uL (ref 4.0–10.5)

## 2018-03-04 LAB — PREALBUMIN: Prealbumin: 9 mg/dL — ABNORMAL LOW (ref 18–38)

## 2018-03-04 MED ORDER — MENINGOCOCCAL A C Y&W-135 OLIG IM SOLR
0.5000 mL | Freq: Once | INTRAMUSCULAR | Status: AC
  Administered 2018-03-04: 0.5 mL via INTRAMUSCULAR
  Filled 2018-03-04: qty 0.5

## 2018-03-04 MED ORDER — OSMOLITE 1.5 CAL PO LIQD: 1000.0000 mL | ORAL | 2 refills | Status: AC

## 2018-03-04 MED ORDER — HAEMOPHILUS B POLYSAC CONJ VAC 10 MCG IJ SOLR
0.5000 mL | Freq: Once | INTRAMUSCULAR | Status: DC
  Filled 2018-03-04: qty 0.5

## 2018-03-04 MED ORDER — OSMOLITE 1.5 CAL PO LIQD
1000.0000 mL | ORAL | Status: DC
  Administered 2018-03-05 – 2018-03-06 (×2): 1000 mL
  Filled 2018-03-04 (×2): qty 1000

## 2018-03-04 MED ORDER — PRO-STAT SUGAR FREE PO LIQD
30.0000 mL | Freq: Three times a day (TID) | ORAL | Status: DC
  Administered 2018-03-04 – 2018-03-06 (×6): 30 mL
  Filled 2018-03-04 (×6): qty 30

## 2018-03-04 MED ORDER — OSMOLITE 1.5 CAL PO LIQD: 1000.0000 mL | ORAL | Status: DC

## 2018-03-04 MED ORDER — PNEUMOCOCCAL VAC POLYVALENT 25 MCG/0.5ML IJ INJ
0.5000 mL | INJECTION | INTRAMUSCULAR | Status: AC
  Administered 2018-03-05: 0.5 mL via INTRAMUSCULAR
  Filled 2018-03-04: qty 0.5

## 2018-03-04 MED ORDER — HAEMOPHILUS B POLYSAC CONJ VAC IM SOLR
0.5000 mL | Freq: Once | INTRAMUSCULAR | Status: AC
  Administered 2018-03-04: 0.5 mL via INTRAMUSCULAR
  Filled 2018-03-04 (×2): qty 0.5

## 2018-03-04 MED ORDER — PRO-STAT SUGAR FREE PO LIQD: 30.0000 mL | Freq: Three times a day (TID) | ORAL | 0 refills | Status: AC

## 2018-03-04 MED ORDER — OXYCODONE HCL 5 MG/5ML PO SOLN: 5.0000 mg | ORAL | 0 refills | Status: DC | PRN

## 2018-03-04 NOTE — Care Management Note (Signed)
Case Management Note  Patient Details  Name: Ryan Collins MRN: [id number] Date of Birth: 30-Mar-1961  Subjective/Objective:   Pt with gastric cancer admitted on 10/15 s/p lap diagnostic total gastrectomy, splenectomy, pancreatectomy, and J-tube placement.  PTA, pt independent, lives at home with brother.               Action/Plan: Met with pt to discuss dc planning.  He states he was not aware he would need home tube feedings at discharge.  He states that his brother is home most of the time, and will be able to assist him with tube feedings, if needed.  He is agreeable to [facility] follow up; referral to [facility] per pt choice for [facility], tube feeding and pump.  Consult to dietician for recommendations on nocturnal feeds once pt at goal rate.  Pt states he is up ambulating independently in room; has been walking in halls with assist.  Instructed bedside nurse to provide teaching for hooking up tube feeding and water flushes, and to allow pt demonstrate when possible.  She states she will do this and pass on to other shifts.    Expected Discharge Date:                  Expected Discharge Plan:  [place]  In-House Referral:     Discharge planning Services  CM Consult  Post Acute Care Choice:  Home Health Choice offered to:  Patient  DME Arranged:  Tube feeding pump, Tube feeding DME Agency:  [place]:  RN [facility] Agency:  [place]  Status of Service:  In process, will continue to follow  If discussed at Long Length of Stay Meetings, dates discussed:    Additional Comments:  Reinaldo Raddle, RN, BSN  Trauma/Neuro ICU Case Manager [phone]

## 2018-03-04 NOTE — Plan of Care (Signed)

## 2018-03-04 NOTE — Discharge Summary (Signed)
Physician Discharge Summary  Patient ID: Ryan Collins MRN: [id number] DOB/AGE: 56/07/1960 56 y.o.  Admit date: 02/25/2018  Discharge date: 03/06/2018   Admission Diagnoses: Patient Active Problem List   Diagnosis Date Noted  . Jejunostomy tube present ([place]) 03/04/2018  . S/P total gastrectomy and Roux-en-Y esophagojejunal anastomosis 03/04/2018  . Protein-calorie malnutrition, severe 02/27/2018  . Essential hypertension 06/12/2017  . Gastric cancer ([place]) 06/10/2017  . Iron deficiency anemia 06/03/2017  . History of DVT (deep vein thrombosis) 06/03/2017  . Vitamin B12 deficiency 06/03/2017  . Hypokalemia 06/03/2017  . Alcohol abuse 06/03/2017    Discharge Diagnoses:  Active Problems:   Gastric cancer ([place]) S/p total gastrectomy   Protein-calorie malnutrition, severe acute blood loss anemia Jejunostomy tube in place.    Discharged Condition: stable  Hospital Course: Pt was admitted to the ICU with epidural following diagnostic laparoscopy (6 negative peritoneal/liver biopsies),  total gastrectomy en bloc with splenectomy and distal pancreatectomy with feeding tube placement 02/25/2018 for gastric cancer at the posterior cardia s/p neoadjuvant chemotherapy.  He was sent to ICU based on epidural placement and episode of hypotension.  He required 1 u pRBCs on POD 0.  He was quite sore and used a full dose dilaudid PCA in addition to the epidural with local.    His NGT came out inadvertently on POD 2, but patient did not have any nausea.  He was started on tube feeds on POD 2.  He had good urine output and no further episodes of hypotension.  He had an upper GI that showed NO evidence of leak, so he was started on clear liquids.  His tube feeds were advanced.  His epidural and foley were removed on POD 4.  His PCA was discontinued the following day.  He became much more ambulatory.    He was advanced to full liquids and tolerates them but only very small amounts at a time, which is  consistent with fresh total gastrectomy.  His J tube clotted off once, but the nursing staff was able to get it unclogged.    He was advanced to goal on his feeds and pureed diet was attempted. This was successful along with cycled nocturnal tube feeds.  He is discharged to home with tube feeds in stable condition with good pain control on oral medication.     The LUQ JP drain for was sent for amylase, which was elevated at 1500. Drain was therefore left in place.   Consults: nutrition and anesthesia  Significant Diagnostic Studies: labs: HCT prior to d/c 29.1, Cr 0.63, WBCs 10.3, prealbumin 9,   Treatments: surgery: see above.    Discharge Exam: Blood pressure 112/73, pulse 81, temperature 99.6 F (37.6 C), temperature source Oral, resp. rate 20, weight 69 kg, SpO2 100 %. General appearance: alert, cooperative and no distress Resp: breathing comfortably Cardio: regular rate and rhythm GI: soft, non distended.  staples in place.  no erythema or drainage.  JP serosang. J tube in place Extremities: extremities normal, atraumatic, no cyanosis or edema  Disposition: Discharge disposition: 01-Home or Self Care       Discharge Instructions    Increase activity slowly   Complete by:  As directed      Allergies as of 03/06/2018   No Known Allergies     Medication List    TAKE these medications   acetaminophen 500 MG tablet Commonly known as:  TYLENOL Take 1,000 mg by mouth every 6 (six) hours as needed for mild pain.  amLODipine 5 MG tablet Commonly known as:  NORVASC Take 1 tablet (5 mg total) by mouth daily.   Blood Pressure Monitor/M Cuff Misc 1 each by Does not apply route daily.   feeding supplement (OSMOLITE 1.5 CAL) Liqd Place 1,000 mLs into feeding tube daily.   feeding supplement (PRO-STAT SUGAR FREE 64) Liqd Place 30 mLs into feeding tube 3 (three) times daily.   lidocaine-prilocaine cream Commonly known as:  EMLA Apply 1 application topically as needed  (for port).   mirtazapine 15 MG tablet Commonly known as:  REMERON Take 1 tablet (15 mg total) by mouth at bedtime.   ondansetron 8 MG tablet Commonly known as:  ZOFRAN Take 8 mg by mouth 2 (two) times daily as needed for refractory nausea / vomiting. Start on day 3 after chemo   oxyCODONE 5 MG/5ML solution Commonly known as:  ROXICODONE Take 5-10 mLs (5-10 mg total) by mouth every 4 (four) hours as needed for moderate pain or severe pain.   pantoprazole 40 MG tablet Commonly known as:  PROTONIX Take 1 tablet (40 mg total) by mouth 2 (two) times daily.   prochlorperazine 10 MG tablet Commonly known as:  COMPAZINE Take 1 tablet (10 mg total) by mouth every 6 (six) hours as needed for nausea or vomiting.   thiamine 100 MG tablet Take 1 tablet (100 mg total) by mouth daily.   vitamin B-12 1000 MCG tablet Commonly known as:  CYANOCOBALAMIN Take 1 tablet (1,000 mcg total) by mouth daily.            Durable Medical Equipment  (From admission, onward)         Start     Ordered   03/06/18 1131  For home use only DME Tube feeding  Once    Comments:  Osmolite or equivalent 1.5 formula via J-tube at goal rate of 85 ml/hr x 12 hours (7pm-7am). Provide 30 ml Prostat (or equivalent) TID per tube.   Nocturnal tube feeding regimen to provide 1830 kcal (92% of kcal needs), 109 grams of protein (100% of protein needs), 775 ml water.   03/06/18 1131   03/04/18 1440  For home use only DME Tube feeding pump  Once     03/04/18 1441         Follow-up Information    [name], MD Follow up in 2 week(s).   Specialty:  General Surgery Contact information: [address] [place] [address] [phone]        [name], MD Follow up in 4 week(s).   Specialties:  Hematology, Oncology Contact information: [place] [address] [phone]        [facility] Follow up in 1 week(s).   Why:  for staple  removal. Contact information: [address] [place] [phone]       [facility] [place] Follow up.   Specialty:  Home Health Services Why:  Home health to follow up with you for tube feeding teaching and drain care.  [facility] to provide tube feedings and tube feeding pump; they will deliver to your home.   Contact information: [address] [place] [address] [phone]           Signed: [name] 03/06/2018, 7:56 PM

## 2018-03-04 NOTE — Discharge Instructions (Signed)
Feeding Tube Use Some people who have trouble swallowing or cannot take food or medicine by mouth may need a feeding tube. A feeding tube can go into the nose and down to the stomach or small bowel, or through the skin in the abdomen and into the stomach or small bowel. Liquid food (formula), breast milk, or medicines may be given through the tube. Supplies needed for a tube feeding:  Clean gloves.  Prescribed formula or breast milk.  Appropriate feeding bag set, gravity drip tubing set, or 30-60-mL syringe with feeding extension tubing.  Feeding tube pump or syringe pump as needed.  Pole as needed.  20-60-mL syringe to check tube placement.  A syringe to flush the feeding tube.  Container.  Water.   How to give a feeding through a feeding tube pump 1. Have all supplies ready and available. 2. Wash hands well. 3. Put on clean gloves. 4. Check the placement of the feeding tube as directed. 5. Raise the head of the person 30-45 degrees or as directed. 6. Pour up to 4 hours of room-temperature feeding into the feeding bag set. 7. Hang the feeding bag set from a pole. 8. Flush the entire feeding bag set with the feeding. 9. Load the feeding bag set into the feeding tube pump. 10. Cap the feeding bag set. 11. Uncap the feeding tube. 12. Using a syringe, flush the feeding tube with water as directed. 13. Uncap the feeding bag set. 14. Connect the feeding bag set to the feeding tube. 15. Remove gloves. 16. Wash hands well. 17. Set the prescribed feeding rate. 18. Start the feeding tube pump.  Supplies needed for giving medicines through a feeding tube  60-mL syringe.  Container.  Water.    Clean gloves.   How to give medicines through a feeding tube 1. Have all supplies ready and available. 2. Wash hands well. 3. If the medicine cannot be given with the feeding, stop the feeding 60 minutes before giving the medicine. 4. If the person needs to take medicine on an  empty stomach, consider not giving a feeding for up to 2 hours (hold time) before giving medicine. Talk to the health care provider about the correct hold times. 5. Fill a container with 50-100 mL of warm water. 6. Prepare medicine that will go into the feeding tube: ? Liquid--Measure the prescribed medicine dose. 7. Raise the head of the person 30-45 degrees or as directed. 8. Wash hands well. 9. Put on clean gloves. 10. If a continuous tube feeding is infusing, stop the tube feeding. 11. If applicable, close the tubing clamp. 12. Disconnect the feeding bag set, or the gravity drip tubing set, from the feeding tube. 13. Cap the feeding bag set or gravity drip tubing set. 14. Check the placement of the feeding tube as directed. 15. Draw up 30 mL of water into a syringe or as directed. 16. Insert the tip of the syringe into the feeding tube. 17. Using the syringe, flush the feeding tube with water. 18. Remove the plunger of the syringe or replace the syringe with a syringe that contains medicine. 19. Give the medicine as directed. ? If giving only 1 dose of medicine, flush the feeding tube with water after giving the medicine. ? If giving more than 1 dose of medicine, give each separately. Flush the feeding tube between medicines and after the last dose of medicine. 20. If a tube feeding will not be continued after the medicine, cap the feeding tube.  Position the person to a comfortable position, but keep his or her head raised for 1 hour after giving the medicine. 21. If a tube feeding will be continued after the medicine, but the medicine cannot be given with the feeding, continue to hold the feeding for 30-60 minutes after the medicine is given. When appropriate, reconnect the feeding bag set, or gravity drip tubing set, to the feeding tube. 39. Throw away or clean used supplies as directed. 23. Remove gloves. 24. Wash hands well. This information is not intended to replace advice given to  you by your health care provider. Make sure you discuss any questions you have with your health care provider.   [facility], [place] [phone]  ABDOMINAL SURGERY: POST OP INSTRUCTIONS  Always review your discharge instruction sheet given to you by the facility where your surgery was performed.  IF YOU HAVE DISABILITY OR FAMILY LEAVE FORMS, YOU MUST BRING THEM TO THE OFFICE FOR PROCESSING.  PLEASE DO NOT GIVE THEM TO YOUR DOCTOR.  1. A prescription for pain medication may be given to you upon discharge.  Take your pain medication as prescribed, if needed.  If narcotic pain medicine is not needed, then you may take acetaminophen (Tylenol) or ibuprofen (Advil) as needed. 2. Take your usually prescribed medications unless otherwise directed. 3. If you need a refill on your pain medication, please contact your pharmacy. They will contact our office to request authorization.  Prescriptions will not be filled after 5pm or on week-ends. 4. You should follow a light diet the first few days after arrival home, such as soup and crackers, pudding, etc.unless your doctor has advised otherwise. A high-fiber, low fat diet can be resumed as tolerated.   Be sure to include lots of fluids daily. Most patients will experience some swelling and bruising on the chest and neck area.  Ice packs will help.  Swelling and bruising can take several days to resolve 5. Most patients will experience some swelling and bruising in the area of the incision. Ice pack will help. Swelling and bruising can take several days to resolve..  6. It is common to experience some constipation if taking pain medication after surgery.  Increasing fluid intake and taking a stool softener will usually help or prevent this problem from occurring.  A mild laxative (Milk of Magnesia or Miralax) should be taken according to package directions if there are no bowel movements after 48 hours. 7.  You may have steri-strips (small  skin tapes) in place directly over the incision.  These strips should be left on the skin for 10-14 days.  If your surgeon used skin glue on the incision, you may shower in 48 hours.  The glue will flake off over the next 2-3 weeks.  Any sutures or staples will be removed at the office during your follow-up visit. You may find that a light gauze bandage over your incision may keep your staples from being rubbed or pulled. You may shower and replace the bandage daily. 8. ACTIVITIES:  You may resume regular (light) daily activities beginning the next day--such as daily self-care, walking, climbing stairs--gradually increasing activities as tolerated.  You may have sexual intercourse when it is comfortable.  Refrain from any heavy lifting or straining until approved by your doctor. a. You may drive when you no longer are taking prescription pain medication, you can comfortably wear a seatbelt, and you can safely maneuver your car and apply brakes b.  Return to Work: __________8 weeks if applicable_________________________ 31. You should see your doctor in the office for a follow-up appointment approximately two weeks after your surgery.  Make sure that you call for this appointment within a day or two after you arrive home to insure a convenient appointment time. OTHER INSTRUCTIONS:  _____________________________________________________________ _____________________________________________________________  WHEN TO CALL YOUR DOCTOR: 1. Fever over 101.0 2. Inability to urinate 3. Nausea and/or vomiting 4. Extreme swelling or bruising 5. Continued bleeding from incision. 6. Increased pain, redness, or drainage from the incision. 7. Difficulty swallowing or breathing 8. Muscle cramping or spasms. 9. Numbness or tingling in hands or feet or around lips.  The clinic staff is available to answer your questions during regular business hours.  Please dont hesitate to call and ask to speak to one of the nurses  if you have concerns.  For further questions, please visit [email or web address]

## 2018-03-04 NOTE — Progress Notes (Signed)
Charge RN took downtime form for amylase, peritoneal fluid specimen to lab for specimen to be processed; lab staff notified RN that it is a send out and [facility] would be posting results.

## 2018-03-04 NOTE — Progress Notes (Addendum)
Nutrition Follow-up  DOCUMENTATION CODES:   Severe malnutrition in context of chronic illness  INTERVENTION:   Once goal rate is established and tolerated,  Recommend transitioning to nocturnal tube feeds using Osmolite 1.5 formula via J-tube at goal rate of 85 ml/hr x 12 hours (7pm-7am).  Provide 30 ml Prostat (or equivalent) TID per tube.   Nocturnal tube feeding regimen to provide 1830 kcal (92% of kcal needs), 109 grams of protein (100% of protein needs), 775 ml water.   Continue dysphagia 1 diet with pudding thick liquids as tolerated.  Provide Magic cup TID with meals, each supplement provides 290 kcal and 9 grams of protein.   NUTRITION DIAGNOSIS:   Severe Malnutrition related to cancer and cancer related treatments, chronic illness as evidenced by severe fat depletion, severe muscle depletion, energy intake < or equal to 75% for > or equal to 1 month, percent weight loss; ongoing  GOAL:   Patient will meet greater than or equal to 90% of their needs; progressing  MONITOR:   Diet advancement, TF tolerance, Labs  REASON FOR ASSESSMENT:   Consult Enteral/tube feeding initiation and management  ASSESSMENT:   Pt with PMH of HTN and daily ETOH use, who was dx with gastric cancer 05/2017 s/p chemo now admitted for surgery. Pt is s/p total gastrectomy with J tube placement.   Diet has been advanced to a dysphagia 1 diet with pudding thick liquids PO as tolerated. Pt with minimal po intake while on previous full liquid diet. Will order Magic at meals to aid in nutrition and po intake. Plans to advance tube feeding to goal rate of 55 ml/hr via J-tube today to provide 2180 kcal and 83 grams of protein (only meeting 81% of protein needs). RD to additionally order Prostat to aid in protein needs. RD consulted for recommended home nocturnal feeds once goal rate is tolerated and established. Nocturnal tube feeding recommendations stated above. RD to continue to monitor. Labs and  medications reviewed.   Diet Order:   Diet Order            DIET - DYS 1 Room service appropriate? Yes; Fluid consistency: Pudding Thick  Diet effective now              EDUCATION NEEDS:   Education needs have been addressed  Skin:  Skin Assessment: Skin Integrity Issues: Skin Integrity Issues:: Incisions Incisions: abdomen  Last BM:  Unknown  Height:   Ht Readings from Last 1 Encounters:  02/18/18 5' 11.5" (1.816 m)    Weight:   Wt Readings from Last 1 Encounters:  03/04/18 65.1 kg    Ideal Body Weight:  79.5 kg  BMI:  Body mass index is 19.74 kg/m.  Estimated Nutritional Needs:   Kcal:  2000-2300  Protein:  102-120 grams  Fluid:  > 2 L/day    Corrin Parker, MS, RD, LDN Pager # [phone] After hours/ weekend pager # [phone]

## 2018-03-04 NOTE — Progress Notes (Signed)
Called and spoke with [facility] and clarified questions regarding Port-a-cath care. Instructed to consult [facility] with further questions. VU. Fran Lowes, RN VAST

## 2018-03-04 NOTE — Progress Notes (Signed)
Patient ID: Ryan Collins, male   DOB: 25-Aug-1960, 57 y.o.   MRN: [id number] 7 Days Post-Op   Subjective: J tube was clogged a bit and tube feeds were off for around 18 hours.  He is back on tube feeds now.    Objective: Vital signs in last 24 hours: Temp:  [98.4 F (36.9 C)-99.4 F (37.4 C)] 99 F (37.2 C) (10/22 0900) Pulse Rate:  [84-96] 91 (10/22 0505) Resp:  [15-19] 19 (10/22 0505) BP: (117-134)/(71-91) 122/85 (10/22 0505) SpO2:  [97 %-100 %] 100 % (10/22 0505) Weight:  [65.1 kg] 65.1 kg (10/22 0505)    Intake/Output from previous day: 10/21 0701 - 10/22 0700 In: 2299.7 [I.V.:1809.6; IV Piggyback:460.1] Out: 135 [Drains:135] Intake/Output this shift: No intake/output data recorded.  General appearance: alert, cooperative and no distress GI: normal findings: soft, non-tender and Nondistended Incision/Wound: Dressings clean and dry Drain serosang.  Lab Results:  Recent Labs    03/02/18 0344  WBC 9.7  HGB 9.0*  HCT 28.0*  PLT 420*   BMET Recent Labs    03/02/18 0344  NA 135  K 4.0  CL 103  CO2 27  GLUCOSE 113*  BUN <5*  CREATININE 0.57*  CALCIUM 8.5*     Studies/Results: No results found.  Anti-infectives: Anti-infectives (From admission, onward)   Start     Dose/Rate Route Frequency Ordered Stop   02/25/18 1500  ceFAZolin (ANCEF) IVPB 2g/100 mL premix     2 g 200 mL/hr over 30 Minutes Intravenous Every 8 hours 02/25/18 1451 02/25/18 1641   02/25/18 0630  ceFAZolin (ANCEF) IVPB 2g/100 mL premix     2 g 200 mL/hr over 30 Minutes Intravenous On call to O.R. 02/25/18 0627 02/25/18 1220   02/25/18 0630  ceFAZolin (ANCEF) 2-4 GM/100ML-% IVPB    Note to Pharmacy:  Merryl Hacker   : cabinet override      02/25/18 0630 02/25/18 0806      Assessment/Plan: s/p Procedure(s): LAPAROSCOPY DIAGNOSTIC TOTAL GASTRECTOMY WITH J TUBE PLACEMENT JEJUNOSTOMY TUBE SPLENECTOMY PANCREATECTOMY  Advance tube feeds to goal.    Work on discharge planning.    mech soft diet, very little caloric intact, consistent with total gastrectomy  Still don't have drain amylase- will get this.     LOS: 7 days    [name] 03/04/2018

## 2018-03-05 MED ORDER — ACETAMINOPHEN 160 MG/5ML PO SOLN: 650.0000 mg | Freq: Four times a day (QID) | ORAL | Status: DC | PRN

## 2018-03-05 MED ORDER — OSMOLITE 1.5 CAL PO LIQD
1000.0000 mL | ORAL | Status: AC
  Administered 2018-03-05: 1000 mL
  Filled 2018-03-05 (×2): qty 1000

## 2018-03-05 NOTE — Progress Notes (Signed)
Pt tube feeding was restarted at 85 ml/hr for 12 hours at 21:55. The line was flushed and patent. Will continue to monitor pt throughout the night to check tolerance level.

## 2018-03-05 NOTE — Progress Notes (Addendum)
Patient ID: Ryan Collins, male   DOB: 09-05-1960, 57 y.o.   MRN: [id number] 8 Days Post-Op   Subjective: No acute change. JP amylase 1500  Objective: Vital signs in last 24 hours: Temp:  [98.6 F (37 C)-99.7 F (37.6 C)] 98.8 F (37.1 C) (10/23 0921) Pulse Rate:  [79-92] 79 (10/23 0921) Resp:  [12-24] 16 (10/23 0921) BP: (106-128)/(68-88) 106/88 (10/23 0921) SpO2:  [100 %] 100 % (10/23 0921) Weight:  [68.7 kg] 68.7 kg (10/23 0500)    Intake/Output from previous day: 10/22 0701 - 10/23 0700 In: -  Out: 40 [Drains:40] Intake/Output this shift: No intake/output data recorded.  General appearance: alert, cooperative and no distress GI: normal findings: soft, non-tender and Nondistended Incision/Wound: Dressings clean and dry Drain serosang.  Lab Results:  Recent Labs    03/04/18 1235  WBC 10.3  HGB 9.6*  HCT 29.1*  PLT 565*   BMET Recent Labs    03/04/18 1235  NA 136  K 4.0  CL 103  CO2 25  GLUCOSE 126*  BUN 7  CREATININE 0.63  CALCIUM 8.8*     Studies/Results: No results found.  Anti-infectives: Anti-infectives (From admission, onward)   Start     Dose/Rate Route Frequency Ordered Stop   02/25/18 1500  ceFAZolin (ANCEF) IVPB 2g/100 mL premix     2 g 200 mL/hr over 30 Minutes Intravenous Every 8 hours 02/25/18 1451 02/25/18 1641   02/25/18 0630  ceFAZolin (ANCEF) IVPB 2g/100 mL premix     2 g 200 mL/hr over 30 Minutes Intravenous On call to O.R. 02/25/18 0627 02/25/18 1220   02/25/18 0630  ceFAZolin (ANCEF) 2-4 GM/100ML-% IVPB    Note to Pharmacy:  Merryl Hacker   : cabinet override      02/25/18 0630 02/25/18 0806      Assessment/Plan: s/p Procedure(s): LAPAROSCOPY DIAGNOSTIC TOTAL GASTRECTOMY WITH J TUBE PLACEMENT JEJUNOSTOMY TUBE SPLENECTOMY PANCREATECTOMY  Advance tube feeds to goal, will try cycled TF overnight this evening.     mech soft diet, very little caloric intact, consistent with total gastrectomy  Drain amylase elevated  c/w low grade pancreatic leak, continue drain  Discharge when home health care arranged- aim for 10/24   LOS: 8 days    Ryan Collins 03/05/2018

## 2018-03-06 NOTE — Progress Notes (Signed)
Discharge instructions reviewed with patient.  Patient transported via wheelchair to be discharged home with family member.

## 2018-03-06 NOTE — Progress Notes (Signed)
Patient has well this pm denying any difficulties with nocturnal tube feedings Patient states pain meds worked well for about 6 hours for him to rest.

## 2018-03-06 NOTE — Progress Notes (Signed)
Patient ID: Ryan Collins, male   DOB: 07-05-1960, 57 y.o.   MRN: [id number] 9 Days Post-Op   Subjective: Tolerating nocturnal tube feeds  Objective: Vital signs in last 24 hours: Temp:  [98.3 F (36.8 C)-99.6 F (37.6 C)] 99.6 F (37.6 C) (10/24 0447) Pulse Rate:  [76-93] 81 (10/24 0447) Resp:  [16-20] 20 (10/23 1200) BP: (106-152)/(55-88) 118/76 (10/24 0447) SpO2:  [95 %-100 %] 100 % (10/24 0447) Weight:  [69 kg] 69 kg (10/24 0500)    Intake/Output from previous day: 10/23 0701 - 10/24 0700 In: 640 [P.O.:240; I.V.:400] Out: 40 [Drains:40] Intake/Output this shift: No intake/output data recorded.  General appearance: alert, cooperative and no distress GI: normal findings: soft, non-tender and Nondistended Incision/Wound: Dressings clean and dry Drain serosang.  Lab Results:  Recent Labs    03/04/18 1235  WBC 10.3  HGB 9.6*  HCT 29.1*  PLT 565*   BMET Recent Labs    03/04/18 1235  NA 136  K 4.0  CL 103  CO2 25  GLUCOSE 126*  BUN 7  CREATININE 0.63  CALCIUM 8.8*     Studies/Results: No results found.  Anti-infectives: Anti-infectives (From admission, onward)   Start     Dose/Rate Route Frequency Ordered Stop   02/25/18 1500  ceFAZolin (ANCEF) IVPB 2g/100 mL premix     2 g 200 mL/hr over 30 Minutes Intravenous Every 8 hours 02/25/18 1451 02/25/18 1641   02/25/18 0630  ceFAZolin (ANCEF) IVPB 2g/100 mL premix     2 g 200 mL/hr over 30 Minutes Intravenous On call to O.R. 02/25/18 0627 02/25/18 1220   02/25/18 0630  ceFAZolin (ANCEF) 2-4 GM/100ML-% IVPB    Note to Pharmacy:  Ryan Collins   : cabinet override      02/25/18 0630 02/25/18 0806      Assessment/Plan: s/p Procedure(s): LAPAROSCOPY DIAGNOSTIC TOTAL GASTRECTOMY WITH J TUBE PLACEMENT JEJUNOSTOMY TUBE SPLENECTOMY PANCREATECTOMY  Tolerating nocturnal tube feeds     mech soft diet, very little caloric intact, consistent with total gastrectomy  Drain amylase elevated c/w low grade  pancreatic leak, continue drain  Discharge today   LOS: 9 days    Ryan Collins 03/06/2018

## 2018-03-17 ENCOUNTER — Inpatient Hospital Stay: Payer: Medicaid Other

## 2018-03-17 ENCOUNTER — Other Ambulatory Visit: Payer: Self-pay

## 2018-03-17 ENCOUNTER — Telehealth: Payer: Self-pay

## 2018-03-17 ENCOUNTER — Inpatient Hospital Stay: Payer: Medicaid Other | Attending: Hematology

## 2018-03-17 VITALS — BP 100/69 | HR 62 | Temp 99.5°F | Resp 16

## 2018-03-17 DIAGNOSIS — F1210 Cannabis abuse, uncomplicated: Secondary | ICD-10-CM | POA: Insufficient documentation

## 2018-03-17 DIAGNOSIS — R1013 Epigastric pain: Secondary | ICD-10-CM | POA: Diagnosis not present

## 2018-03-17 DIAGNOSIS — D509 Iron deficiency anemia, unspecified: Secondary | ICD-10-CM | POA: Diagnosis not present

## 2018-03-17 DIAGNOSIS — C786 Secondary malignant neoplasm of retroperitoneum and peritoneum: Secondary | ICD-10-CM | POA: Diagnosis not present

## 2018-03-17 DIAGNOSIS — E46 Unspecified protein-calorie malnutrition: Secondary | ICD-10-CM | POA: Insufficient documentation

## 2018-03-17 DIAGNOSIS — D6859 Other primary thrombophilia: Secondary | ICD-10-CM | POA: Insufficient documentation

## 2018-03-17 DIAGNOSIS — Z86718 Personal history of other venous thrombosis and embolism: Secondary | ICD-10-CM | POA: Insufficient documentation

## 2018-03-17 DIAGNOSIS — E538 Deficiency of other specified B group vitamins: Secondary | ICD-10-CM | POA: Diagnosis not present

## 2018-03-17 DIAGNOSIS — C161 Malignant neoplasm of fundus of stomach: Secondary | ICD-10-CM

## 2018-03-17 DIAGNOSIS — R5383 Other fatigue: Secondary | ICD-10-CM | POA: Insufficient documentation

## 2018-03-17 DIAGNOSIS — Z9081 Acquired absence of spleen: Secondary | ICD-10-CM | POA: Diagnosis not present

## 2018-03-17 DIAGNOSIS — K922 Gastrointestinal hemorrhage, unspecified: Secondary | ICD-10-CM | POA: Insufficient documentation

## 2018-03-17 DIAGNOSIS — Z7901 Long term (current) use of anticoagulants: Secondary | ICD-10-CM | POA: Insufficient documentation

## 2018-03-17 DIAGNOSIS — T451X5S Adverse effect of antineoplastic and immunosuppressive drugs, sequela: Secondary | ICD-10-CM | POA: Insufficient documentation

## 2018-03-17 DIAGNOSIS — G620 Drug-induced polyneuropathy: Secondary | ICD-10-CM | POA: Diagnosis not present

## 2018-03-17 DIAGNOSIS — Z9221 Personal history of antineoplastic chemotherapy: Secondary | ICD-10-CM | POA: Insufficient documentation

## 2018-03-17 DIAGNOSIS — F1010 Alcohol abuse, uncomplicated: Secondary | ICD-10-CM | POA: Insufficient documentation

## 2018-03-17 DIAGNOSIS — K760 Fatty (change of) liver, not elsewhere classified: Secondary | ICD-10-CM | POA: Insufficient documentation

## 2018-03-17 DIAGNOSIS — Z79899 Other long term (current) drug therapy: Secondary | ICD-10-CM | POA: Insufficient documentation

## 2018-03-17 DIAGNOSIS — I10 Essential (primary) hypertension: Secondary | ICD-10-CM | POA: Insufficient documentation

## 2018-03-17 DIAGNOSIS — D500 Iron deficiency anemia secondary to blood loss (chronic): Secondary | ICD-10-CM

## 2018-03-17 LAB — CBC WITH DIFFERENTIAL (CANCER CENTER ONLY)
Abs Immature Granulocytes: 0.06 10*3/uL (ref 0.00–0.07)
BASOS PCT: 1 %
Basophils Absolute: 0.1 10*3/uL (ref 0.0–0.1)
EOS PCT: 2 %
Eosinophils Absolute: 0.2 10*3/uL (ref 0.0–0.5)
HCT: 32.9 % — ABNORMAL LOW (ref 39.0–52.0)
HEMOGLOBIN: 9.9 g/dL — AB (ref 13.0–17.0)
Immature Granulocytes: 1 %
LYMPHS PCT: 35 %
Lymphs Abs: 3.2 10*3/uL (ref 0.7–4.0)
MCH: 28.6 pg (ref 26.0–34.0)
MCHC: 30.1 g/dL (ref 30.0–36.0)
MCV: 95.1 fL (ref 80.0–100.0)
MONO ABS: 0.8 10*3/uL (ref 0.1–1.0)
Monocytes Relative: 9 %
Neutro Abs: 4.9 10*3/uL (ref 1.7–7.7)
Neutrophils Relative %: 52 %
Platelet Count: 544 10*3/uL — ABNORMAL HIGH (ref 150–400)
RBC: 3.46 MIL/uL — AB (ref 4.22–5.81)
RDW: 17.5 % — ABNORMAL HIGH (ref 11.5–15.5)
WBC Count: 9.1 10*3/uL (ref 4.0–10.5)
nRBC: 0 % (ref 0.0–0.2)

## 2018-03-17 LAB — CMP (CANCER CENTER ONLY)
ALK PHOS: 71 U/L (ref 38–126)
ALT: 34 U/L (ref 0–44)
AST: 24 U/L (ref 15–41)
Albumin: 3.1 g/dL — ABNORMAL LOW (ref 3.5–5.0)
Anion gap: 8 (ref 5–15)
BILIRUBIN TOTAL: 0.3 mg/dL (ref 0.3–1.2)
BUN: 13 mg/dL (ref 6–20)
CO2: 25 mmol/L (ref 22–32)
CREATININE: 0.72 mg/dL (ref 0.61–1.24)
Calcium: 8.6 mg/dL — ABNORMAL LOW (ref 8.9–10.3)
Chloride: 110 mmol/L (ref 98–111)
GFR, Est AFR Am: >60 mL/min (ref >60)
Glucose, Bld: 70 mg/dL (ref 70–99)
Potassium: 3.8 mmol/L (ref 3.5–5.1)
Sodium: 143 mmol/L (ref 135–145)
TOTAL PROTEIN: 6.2 g/dL — AB (ref 6.5–8.1)

## 2018-03-17 LAB — PREALBUMIN: Prealbumin: 22.8 mg/dL (ref 18–38)

## 2018-03-17 MED ORDER — SODIUM CHLORIDE 0.9 % IV SOLN
Freq: Once | INTRAVENOUS | Status: AC
  Administered 2018-03-17: 16:00:00 via INTRAVENOUS
  Filled 2018-03-17: qty 250

## 2018-03-17 MED ORDER — SODIUM CHLORIDE 0.9% FLUSH
10.0000 mL | Freq: Once | INTRAVENOUS | Status: AC
  Administered 2018-03-17: 10 mL via INTRAVENOUS
  Filled 2018-03-17: qty 10

## 2018-03-17 MED ORDER — HEPARIN SOD (PORK) LOCK FLUSH 100 UNIT/ML IV SOLN
500.0000 [IU] | Freq: Once | INTRAVENOUS | Status: AC
  Administered 2018-03-17: 500 [IU] via INTRAVENOUS
  Filled 2018-03-17: qty 5

## 2018-03-17 MED ORDER — SODIUM CHLORIDE 0.9 % IV SOLN
Freq: Once | INTRAVENOUS | Status: DC
  Filled 2018-03-17: qty 250

## 2018-03-17 NOTE — Telephone Encounter (Signed)
Per Dr. Burr Medico sent scheduling message for lab and infusion today (now) spoke with Lauren in Infusion agreed to add this patient.

## 2018-03-17 NOTE — Patient Instructions (Signed)
Dehydration, Adult Dehydration is when there is not enough fluid or water in your body. This happens when you lose more fluids than you take in. Dehydration can range from mild to very bad. It should be treated right away to keep it from getting very bad. Symptoms of mild dehydration may include:  Thirst.  Dry lips.  Slightly dry mouth.  Dry, warm skin.  Dizziness. Symptoms of moderate dehydration may include:  Very dry mouth.  Muscle cramps.  Dark pee (urine). Pee may be the color of tea.  Your body making less pee.  Your eyes making fewer tears.  Heartbeat that is uneven or faster than normal (palpitations).  Headache.  Light-headedness, especially when you stand up from sitting.  Fainting (syncope). Symptoms of very bad dehydration may include:  Changes in skin, such as: ? Cold and clammy skin. ? Blotchy (mottled) or pale skin. ? Skin that does not quickly return to normal after being lightly pinched and let go (poor skin turgor).  Changes in body fluids, such as: ? Feeling very thirsty. ? Your eyes making fewer tears. ? Not sweating when body temperature is high, such as in hot weather. ? Your body making very little pee.  Changes in vital signs, such as: ? Weak pulse. ? Pulse that is more than 100 beats a minute when you are sitting still. ? Fast breathing. ? Low blood pressure.  Other changes, such as: ? Sunken eyes. ? Cold hands and feet. ? Confusion. ? Lack of energy (lethargy). ? Trouble waking up from sleep. ? Short-term weight loss. ? Unconsciousness. Follow these instructions at home:  If told by your doctor, drink an ORS: ? Make an ORS by using instructions on the package. ? Start by drinking small amounts, about  cup (120 mL) every 5-10 minutes. ? Slowly drink more until you have had the amount that your doctor said to have.  Drink enough clear fluid to keep your pee clear or pale yellow. If you were told to drink an ORS, finish the ORS  first, then start slowly drinking clear fluids. Drink fluids such as: ? Water. Do not drink only water by itself. Doing that can make the salt (sodium) level in your body get too low (hyponatremia). ? Ice chips. ? Fruit juice that you have added water to (diluted). ? Low-calorie sports drinks.  Avoid: ? Alcohol. ? Drinks that have a lot of sugar. These include high-calorie sports drinks, fruit juice that does not have water added, and soda. ? Caffeine. ? Foods that are greasy or have a lot of fat or sugar.  Take over-the-counter and prescription medicines only as told by your doctor.  Do not take salt tablets. Doing that can make the salt level in your body get too high (hypernatremia).  Eat foods that have minerals (electrolytes). Examples include bananas, oranges, potatoes, tomatoes, and spinach.  Keep all follow-up visits as told by your doctor. This is important. Contact a doctor if:  You have belly (abdominal) pain that: ? Gets worse. ? Stays in one area (localizes).  You have a rash.  You have a stiff neck.  You get angry or annoyed more easily than normal (irritability).  You are more sleepy than normal.  You have a harder time waking up than normal.  You feel: ? Weak. ? Dizzy. ? Very thirsty.  You have peed (urinated) only a small amount of very dark pee during 6-8 hours. Get help right away if:  You have symptoms of   very bad dehydration.  You cannot drink fluids without throwing up (vomiting).  Your symptoms get worse with treatment.  You have a fever.  You have a very bad headache.  You are throwing up or having watery poop (diarrhea) and it: ? Gets worse. ? Does not go away.  You have blood or something green (bile) in your throw-up.  You have blood in your poop (stool). This may cause poop to look black and tarry.  You have not peed in 6-8 hours.  You pass out (faint).  Your heart rate when you are sitting still is more than 100 beats a  minute.  You have trouble breathing. This information is not intended to replace advice given to you by your health care provider. Make sure you discuss any questions you have with your health care provider. Document Released: 02/24/2009 Document Revised: 11/18/2015 Document Reviewed: 06/24/2015 [employer] Interactive Patient Education  2018 [employer]  

## 2018-03-18 ENCOUNTER — Telehealth: Payer: Self-pay | Admitting: Hematology

## 2018-03-18 LAB — IRON AND TIBC
Iron: 45 ug/dL (ref 42–163)
Saturation Ratios: 23 % (ref 20–55)
TIBC: 193 ug/dL — ABNORMAL LOW (ref 202–409)
UIBC: 148 ug/dL (ref 117–376)

## 2018-03-18 LAB — CEA (IN HOUSE-CHCC): CEA (CHCC-In House): 2.42 ng/mL (ref 0.00–5.00)

## 2018-03-18 LAB — FERRITIN: FERRITIN: 169 ng/mL (ref 24–336)

## 2018-03-18 NOTE — Telephone Encounter (Signed)
Called patient per 11/5 sch message - unable to reach patient and leave message -

## 2018-03-19 ENCOUNTER — Ambulatory Visit: Payer: Medicaid Other

## 2018-03-19 ENCOUNTER — Inpatient Hospital Stay: Payer: Medicaid Other | Admitting: Hematology

## 2018-03-19 ENCOUNTER — Inpatient Hospital Stay: Payer: Medicaid Other

## 2018-03-24 ENCOUNTER — Telehealth: Payer: Self-pay | Admitting: Hematology

## 2018-03-24 NOTE — Telephone Encounter (Signed)
Spoke to pt regarding appts per 11/9 sch message

## 2018-03-31 ENCOUNTER — Encounter: Payer: Self-pay | Admitting: Hematology

## 2018-03-31 ENCOUNTER — Telehealth: Payer: Self-pay | Admitting: Hematology

## 2018-03-31 ENCOUNTER — Inpatient Hospital Stay: Payer: Medicaid Other

## 2018-03-31 ENCOUNTER — Inpatient Hospital Stay (HOSPITAL_BASED_OUTPATIENT_CLINIC_OR_DEPARTMENT_OTHER): Payer: Medicaid Other | Admitting: Hematology

## 2018-03-31 VITALS — BP 116/73 | HR 63 | Temp 98.7°F | Resp 17 | Ht 71.0 in | Wt 157.8 lb

## 2018-03-31 DIAGNOSIS — K922 Gastrointestinal hemorrhage, unspecified: Secondary | ICD-10-CM

## 2018-03-31 DIAGNOSIS — C786 Secondary malignant neoplasm of retroperitoneum and peritoneum: Secondary | ICD-10-CM | POA: Diagnosis not present

## 2018-03-31 DIAGNOSIS — T451X5S Adverse effect of antineoplastic and immunosuppressive drugs, sequela: Secondary | ICD-10-CM

## 2018-03-31 DIAGNOSIS — G620 Drug-induced polyneuropathy: Secondary | ICD-10-CM

## 2018-03-31 DIAGNOSIS — Z7901 Long term (current) use of anticoagulants: Secondary | ICD-10-CM

## 2018-03-31 DIAGNOSIS — K760 Fatty (change of) liver, not elsewhere classified: Secondary | ICD-10-CM

## 2018-03-31 DIAGNOSIS — E46 Unspecified protein-calorie malnutrition: Secondary | ICD-10-CM

## 2018-03-31 DIAGNOSIS — Z9221 Personal history of antineoplastic chemotherapy: Secondary | ICD-10-CM

## 2018-03-31 DIAGNOSIS — C161 Malignant neoplasm of fundus of stomach: Secondary | ICD-10-CM

## 2018-03-31 DIAGNOSIS — Z95828 Presence of other vascular implants and grafts: Secondary | ICD-10-CM

## 2018-03-31 DIAGNOSIS — F1010 Alcohol abuse, uncomplicated: Secondary | ICD-10-CM

## 2018-03-31 DIAGNOSIS — Z9081 Acquired absence of spleen: Secondary | ICD-10-CM

## 2018-03-31 DIAGNOSIS — Z79899 Other long term (current) drug therapy: Secondary | ICD-10-CM

## 2018-03-31 DIAGNOSIS — R1013 Epigastric pain: Secondary | ICD-10-CM

## 2018-03-31 DIAGNOSIS — D6859 Other primary thrombophilia: Secondary | ICD-10-CM

## 2018-03-31 DIAGNOSIS — F1210 Cannabis abuse, uncomplicated: Secondary | ICD-10-CM

## 2018-03-31 DIAGNOSIS — E538 Deficiency of other specified B group vitamins: Secondary | ICD-10-CM

## 2018-03-31 DIAGNOSIS — Z86718 Personal history of other venous thrombosis and embolism: Secondary | ICD-10-CM

## 2018-03-31 DIAGNOSIS — R5383 Other fatigue: Secondary | ICD-10-CM

## 2018-03-31 DIAGNOSIS — D509 Iron deficiency anemia, unspecified: Secondary | ICD-10-CM

## 2018-03-31 DIAGNOSIS — I10 Essential (primary) hypertension: Secondary | ICD-10-CM

## 2018-03-31 LAB — CMP (CANCER CENTER ONLY)
ALK PHOS: 59 U/L (ref 38–126)
ALT: 23 U/L (ref 0–44)
AST: 25 U/L (ref 15–41)
Albumin: 3.1 g/dL — ABNORMAL LOW (ref 3.5–5.0)
Anion gap: 4 — ABNORMAL LOW (ref 5–15)
BUN: 14 mg/dL (ref 6–20)
CALCIUM: 8.5 mg/dL — AB (ref 8.9–10.3)
CO2: 26 mmol/L (ref 22–32)
CREATININE: 0.69 mg/dL (ref 0.61–1.24)
Chloride: 110 mmol/L (ref 98–111)
GFR, Estimated: >60 mL/min (ref >60)
Glucose, Bld: 72 mg/dL (ref 70–99)
Potassium: 4.2 mmol/L (ref 3.5–5.1)
Sodium: 140 mmol/L (ref 135–145)
Total Bilirubin: 0.4 mg/dL (ref 0.3–1.2)
Total Protein: 6.3 g/dL — ABNORMAL LOW (ref 6.5–8.1)

## 2018-03-31 LAB — CBC WITH DIFFERENTIAL (CANCER CENTER ONLY)
Abs Immature Granulocytes: 0.02 10*3/uL (ref 0.00–0.07)
BASOS ABS: 0 10*3/uL (ref 0.0–0.1)
Basophils Relative: 0 %
EOS PCT: 2 %
Eosinophils Absolute: 0.1 10*3/uL (ref 0.0–0.5)
HEMATOCRIT: 33.8 % — AB (ref 39.0–52.0)
Hemoglobin: 10.6 g/dL — ABNORMAL LOW (ref 13.0–17.0)
Immature Granulocytes: 0 %
LYMPHS ABS: 2.1 10*3/uL (ref 0.7–4.0)
Lymphocytes Relative: 37 %
MCH: 28.6 pg (ref 26.0–34.0)
MCHC: 31.4 g/dL (ref 30.0–36.0)
MCV: 91.1 fL (ref 80.0–100.0)
MONO ABS: 0.7 10*3/uL (ref 0.1–1.0)
Monocytes Relative: 13 %
NRBC: 0 % (ref 0.0–0.2)
Neutro Abs: 2.8 10*3/uL (ref 1.7–7.7)
Neutrophils Relative %: 48 %
Platelet Count: 345 10*3/uL (ref 150–400)
RBC: 3.71 MIL/uL — ABNORMAL LOW (ref 4.22–5.81)
RDW: 17.2 % — ABNORMAL HIGH (ref 11.5–15.5)
WBC Count: 5.7 10*3/uL (ref 4.0–10.5)

## 2018-03-31 MED ORDER — SODIUM CHLORIDE 0.9% FLUSH
10.0000 mL | INTRAVENOUS | Status: DC | PRN
  Administered 2018-03-31: 10 mL via INTRAVENOUS
  Filled 2018-03-31: qty 10

## 2018-03-31 MED ORDER — HEPARIN SOD (PORK) LOCK FLUSH 100 UNIT/ML IV SOLN
500.0000 [IU] | Freq: Once | INTRAVENOUS | Status: AC
  Administered 2018-03-31: 500 [IU] via INTRAVENOUS
  Filled 2018-03-31: qty 5

## 2018-03-31 NOTE — Progress Notes (Signed)
Ryan Collins  [phone] [phone]  Clinic Follow Up Note   Patient Care Team: Lanae Boast, Crowder as PCP - General (Family Medicine) 03/31/2018  CHIEF COMPLAINTS:  Follow up gastric Cancer  Oncology History   Cancer Staging Gastric cancer [facility]) Staging form: Stomach, AJCC 8th Edition - Clinical stage from 06/04/2017: Stage IVB (cT4, cN0, cM1) - Signed by [name], MD on 06/10/2017 - Pathologic stage from 02/25/2018: Stage III (ypT3, pN3a, cM0) - Signed by [name], MD on 04/01/2018       Gastric cancer ([place])   06/03/2017 - 06/06/2017 Hospital Admission    Admitted to the hospital on 06/03/17 with complaints of fatigue and melena. During his stay he underwent an endoscopy that revealed a malignant gastric tumor in the cardia and in the gastric fundus. Biopsy results revealed adenocarcinoma. Pt was discharged on 06/06/17.    06/04/2017 Initial Biopsy    Diagnosis 06/04/17 Stomach, biopsy, Fundus - ADENOCARCINOMA - SEE COMMENT Microscopic Comment The stomach biopsy is of an adenocarcinoma arising in a background of intestinal metaplasia. A Warthin-Starry stain is performed to determine the possibility of the presence of Helicobacter pylori. The Warthin-Starry stain is negative for organisms morphologically consistent with Helicobacter pylori.    06/04/2017 Procedure    Esophagogastroduodenoscopy 06/04/17 by Dr. Benson Norway Impression: Normal esophagus. Malignant gastric tumor in the cardia and in the  gastric fundus. Biopsied. Normal examined duodenum.    06/04/2017 Miscellaneous    HER2 (-) EBV (-) MSI-S PD-L1 CPS 5%    06/05/2017 Imaging    CT CAP W Contrast 06/05/17 IMPRESSION: 9.1 cm fungating mass in the gastric cardia/posterior gastric fundus, corresponding to the patient's newly diagnosed gastric Cancer. Associated extra gastric extension with peritoneal disease in the left upper abdomen, including a dominant 4.2 cm peritoneal implant.  Small volume pelvic ascites. 1.8 cm hypoenhancing lesion along the inferior spleen, indeterminate.    06/10/2017 Initial Diagnosis    Gastric cancer ([place])    06/26/2017 -  Chemotherapy    First line chemo FOLFOX every 2 weeks      08/29/2017 Imaging    IMPRESSION: 1. Interval decrease in size of fungating ulcerative mass arising from the posterior gastric fundus. 2. Left peritoneal implant, compatible with peritoneal carcinomatosis, has decreased in size from the previous exam. 3. Right lobe of liver lesion present on previous exam is less conspicuous on today's study. A new lesion is identified within the left lobe of liver, suspicious for metastasis. Additional focus of low attenuation within segment 5 adjacent to the gallbladder fossa may represent focal fatty deposition. 4. Similar appearance of mildly complex cyst containing mural calcification and possible internal septation arising from the inferior pole of right kidney. More definitive characterization of this lesion with renal protocol MRI may be helpful. 5.  Mild prostate gland enlargement. 6.  Aortic Atherosclerosis (ICD10-I70.0).    11/25/2017 Imaging    11/25/2017 CT AP W Contrast IMPRESSION: 1. Mixed appearance, with the gastric fundal mass thicker than it was on 08/29/2017 (although still improved compared to 06/05/2017), but with reduced local conglomerate adenopathy along the splenic hilum compared to 08/29/2017. 2. At this time I do not see any definite hepatic metastatic lesions. Several lesions of suspicion in the left hepatic lobe and inferiorly in the right hepatic lobe are not readily visible today. There is a small focus of hypodensity adjacent to the gallbladder fossa in segment 7 which is technically nonspecific but which could reflect focal fatty infiltration. 3. Increase in  nodular contour of scarring in the left lower lobe. This is probably incidental but I cannot completely exclude the possibility of a  localized metastatic lesion. Surveillance of the left lower lobe suggested. 4. Bosniak category 11F cyst of the right kidney lower pole with internal septation and calcification. Surveillance of this lesion is suggested; I suspect that the patient's cancer surveillance will supersede the typical recommended surveillance schedule for complex cyst. 5. Other imaging findings of potential clinical significance: Aortic Atherosclerosis (ICD10-I70.0). Degenerative glenohumeral arthropathy bilaterally. Mild impingement at L4-5 and L5-S1.    12/23/2017 Imaging    12/23/2017 MRI Abdomen IMPRESSION: Ill-defined soft tissue mass in the gastric fundus shows no significant change, consistent known gastric adenocarcinoma.  No definite liver metastases identified. Hemosiderosis noted, with focal area of sparing seen in the right hepatic lobe which accounts for the lesions seen on recent CT.  Stable 1.9 cm probably benign Bosniak category 2 F cystic lesion in right kidney. Recommend continued attention on follow-up imaging.    02/12/2018 Imaging    02/12/2018 CT CAP IMPRESSION: 1. Roughly similar appearance of the mass involving the gastric cardia/fundus. 2. Hepatic steatosis with some areas of fatty sparing as shown on MRI. No definite focal metastatic lesion to the liver is identified. 3. Nonspecific 0.8 cm lymph node between the tail the pancreas in the splenic hilum. There is also a mildly enlarged but stable right hilar lymph node at 1.0 cm in diameter, significance uncertain. 4. Overall stable appearance of the 1.9 cm Bosniak category 11F cyst of the right kidney lower pole. We have now documented 9 months of relative stability. Surveillance at or before 6 months time is recommended. 5. Other imaging findings of potential clinical significance: Aortic Atherosclerosis (ICD10-I70.0). Small anterior pericardial effusion. Stable scarring in the left lower lobe. Medial hypoenhancement of the  upper spleen is probably from early phase of contrast, less likely to be splenic infarct given the relatively normal appearance on delayed images. Impingement at L4-5 and L5-S1.    02/25/2018 Surgery    On 02/25/2018, he had a diagnostic laparoscopy, liver biopsy, total gastrectomy with esophagojejunostomy en bloc with distal pancreatectomy/splenectomy, feeding jejunostomy tube with Dr. Barry Dienes.     02/25/2018 Pathology Results    02/25/2018 Surgical Pathology Diagnosis 1. Soft tissue, biopsy, Diaphragmatic Nodule - NEGATIVE FOR CARCINOMA. 2. Liver, biopsy - LIVER PARENCHYMA, NEGATIVE FOR CARCINOMA. 3. Liver, biopsy, Right - LIVER PARENCHYMA WITH CAPSULAR FIBROSIS. - NEGATIVE FOR CARCINOMA. 4. Soft tissue, biopsy, Falciform Nodule - LIVER PARENCHYMA WITH FIBROTIC BANDS. SEE NOTE. - NEGATIVE FOR CARCINOMA. 5. Omentum, resection for tumor - BENIGN PERITONEUM-LINED ADIPOSE TISSUE. - NEGATIVE FOR CARCINOMA. 6. Stomach, resection for tumor, distal pancreas and spleen - INVASIVE MODERATELY TO POORLY DIFFERENTIATED GASTRIC CARCINOMA, 7 CM. - CARCINOMA INVADES INTO PERIGASTRIC SOFT TISSUE AND IS ADHERENT TO PANCREAS AND SPLEEN BUT DOES NOT INVADE INTO PANCREATIC OR SPLENIC PARENCHYMA. - NO EVIDENCE OF LYMPHOVASCULAR OR PERINEURAL INVASION. - MINIMAL THERAPEUTIC RESPONSE (TUMOR REGRESSION SCORE 3). - METASTATIC CARCINOMA TO EIGHT OF THIRTY-SEVEN LYMPH NODES (8/37). - ALL RESECTION MARGINS ARE NEGATIVE FOR CARCINOMA. - BENIGN UNREMARKABLE PANCREAS. - SEE ONCOLOGY TABLE.    02/25/2018 Cancer Staging    Staging form: Stomach, AJCC 8th Edition - Pathologic stage from 02/25/2018: Stage III (ypT3, pN3a, cM0) - Signed by [name], MD on 04/01/2018      HISTORY OF PRESENTING ILLNESS:  Ryan Collins 57 y.o. male is a here because of newly diagnosed gastric cancer. The patient was referred by hospitalist after  her recent hospital discharge.  The patient presents to the clinic today by himself.    Pt went to urgent care on 05/15/17 with complaints of worsening fatigue, central abdominal pain, black stool for 4 months, and 50 lbs weight loss in the past 6 months. discomfort over the past few months. He described the pain as sharp and dull that is worse after he eats. The pain would last 30-40 minutes after eating. Pt was then seen in the ED and admitted to the hospital on 06/03/17 with complaints of fatigue and melena. During his stay he underwent an endoscopy that revealed a malignant gastric tumor in the cardia and in the gastric fundus. Biopsy of the mass revealed adenocarcinoma.  Patient received blood transfusion and 1 dose of IV Feraheme, tolerated well.  His fatigue has much improved.  Pt was dischared on 06/06/17.    Patient is single, no children, lives with his brother in a trailer, he has multiple brothers and sisters and some of them live in the town.  Her mother also lives in [place].  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  He is concerned about his finances if he is not able to continue working.   CURRENT THERAPY: Surveillance  INTERIM HISTORY:  Ryan Collins returns for follow-up.  He underwent gastric cancer surgery by Dr. Barry Dienes on February 25, 2018, is recovering slowly but overall improved lately.  He came in for IV fluids few weeks ago for dehydration. He still on tube feeds at night, about 4 cans.  He eats 2 meals a day, small amount, able to drink 2-3 bottles of waters daily.  He denies dizziness, dehydration.  He has mild pain at his a feeding tube site, no other abdominal pain, his energy level is fair, able to function at home.  He still has numbness and tingling in the fingers and toes, overall improved.  He is able to function at home.     MEDICAL HISTORY:  Past Medical History:  Diagnosis Date  . Cancer [facility])    gastric cancer  . DVT (deep venous thrombosis) ([place]) 2011  . GI bleeding 2001  . History of DVT of lower extremity   .  Hypertension   . Melena 05/2017  . Stomach ulcer    Clinically suspected; No EGD confirmation as of 06/03/17    SURGICAL HISTORY: Past Surgical History:  Procedure Laterality Date  . ESOPHAGOGASTRODUODENOSCOPY  06/04/2017  . ESOPHAGOGASTRODUODENOSCOPY N/A 06/04/2017   Procedure: ESOPHAGOGASTRODUODENOSCOPY (EGD);  Surgeon: Carol Ada, MD;  Location: [place];  Service: Endoscopy;  Laterality: N/A;  . GASTRECTOMY N/A 02/25/2018   Procedure: TOTAL GASTRECTOMY WITH J TUBE PLACEMENT;  Surgeon: [name], MD;  Location: [place];  Service: General;  Laterality: N/A;  . IR FLUORO GUIDE PORT INSERTION RIGHT  06/20/2017  . IR US GUIDE VASC ACCESS RIGHT  06/20/2017  . JEJUNOSTOMY N/A 02/25/2018   Procedure: JEJUNOSTOMY TUBE;  Surgeon: [name], MD;  Location: [place];  Service: General;  Laterality: N/A;  . LAPAROSCOPY N/A 02/25/2018   Procedure: LAPAROSCOPY DIAGNOSTIC;  Surgeon: [name], MD;  Location: [place];  Service: General;  Laterality: N/A;  . SPLENECTOMY, TOTAL N/A 02/25/2018   Procedure: SPLENECTOMY;  Surgeon: [name], MD;  Location: [place];  Service: General;  Laterality: N/A;    SOCIAL HISTORY: Social History   Socioeconomic History  . Marital status: Legally Separated    Spouse name: Not on file  . Number of children: Not on  file  . Years of education: Not on file  . Highest education level: Not on file  Occupational History  . Not on file  Social Needs  . Financial resource strain: Not on file  . Food insecurity:    Worry: Not on file    Inability: Not on file  . Transportation needs:    Medical: Not on file    Non-medical: Not on file  Tobacco Use  . Smoking status: Never Smoker  . Smokeless tobacco: Never Used  Substance and Sexual Activity  . Alcohol use: Yes    Alcohol/week: 5.0 standard drinks    Types: 5 Cans of beer per week    Comment: 05/2017 i QUIT DRINKING 4 MONTHS AGO "  . Drug use: Yes    Frequency: 2.0 times per week    Types:  Marijuana    Comment: last use: 02/17/2018  . Sexual activity: Not on file  Lifestyle  . Physical activity:    Days per week: Not on file    Minutes per session: Not on file  . Stress: Not on file  Relationships  . Social connections:    Talks on phone: Not on file    Gets together: Not on file    Attends religious service: Not on file    Active member of club or organization: Not on file    Attends meetings of clubs or organizations: Not on file    Relationship status: Not on file  . Intimate partner violence:    Fear of current or ex partner: Not on file    Emotionally abused: Not on file    Physically abused: Not on file    Forced sexual activity: Not on file  Other Topics Concern  . Not on file  Social History Narrative  . Not on file    FAMILY HISTORY: Family History  Problem Relation Age of Onset  . Hypertension Mother   . Cancer Sister 80       breast    ALLERGIES:  has No Known Allergies.  MEDICATIONS:  Current Outpatient Medications  Medication Sig Dispense Refill  . acetaminophen (TYLENOL) 500 MG tablet Take 1,000 mg by mouth every 6 (six) hours as needed for mild pain.     . Amino Acids-Protein Hydrolys (FEEDING SUPPLEMENT, PRO-STAT SUGAR FREE 64,) LIQD Place 30 mLs into feeding tube 3 (three) times daily. 900 mL 0  . Blood Pressure Monitoring (BLOOD PRESSURE MONITOR/M CUFF) MISC 1 each by Does not apply route daily. 1 each 0  . lidocaine-prilocaine (EMLA) cream Apply 1 application topically as needed (for port).    . mirtazapine (REMERON) 15 MG tablet Take 1 tablet (15 mg total) by mouth at bedtime. 30 tablet 1  . Nutritional Supplements (FEEDING SUPPLEMENT, OSMOLITE 1.5 CAL,) LIQD Place 1,000 mLs into feeding tube daily. 60000 mL 2  . ondansetron (ZOFRAN) 8 MG tablet Take 8 mg by mouth 2 (two) times daily as needed for refractory nausea / vomiting. Start on day 3 after chemo    . oxyCODONE (ROXICODONE) 5 MG/5ML solution Take 5-10 mLs (5-10 mg total) by mouth  every 4 (four) hours as needed for moderate pain or severe pain. 250 mL 0  . pantoprazole (PROTONIX) 40 MG tablet Take 1 tablet (40 mg total) by mouth 2 (two) times daily. 60 tablet 2  . prochlorperazine (COMPAZINE) 10 MG tablet Take 1 tablet (10 mg total) by mouth every 6 (six) hours as needed for nausea or vomiting. 30 tablet 3  .  thiamine 100 MG tablet Take 1 tablet (100 mg total) by mouth daily. 30 tablet 3  . vitamin B-12 (CYANOCOBALAMIN) 1000 MCG tablet Take 1 tablet (1,000 mcg total) by mouth daily. 30 tablet 0  . amLODipine (NORVASC) 5 MG tablet Take 1 tablet (5 mg total) by mouth daily. (Patient not taking: Reported on 03/31/2018) 30 tablet 11   No current facility-administered medications for this visit.    Facility-Administered Medications Ordered in Other Visits  Medication Dose Route Frequency Provider Last Rate Last Dose  . sodium chloride flush (NS) 0.9 % injection 10 mL  10 mL Intracatheter PRN [name], MD   10 mL at 01/11/18 1222    REVIEW OF SYSTEMS:  Constitutional: Denies fevers, chills or abnormal night sweats, (+) weight loss  Eyes: Denies blurriness of vision, double vision or watery eyes Ears, nose, mouth, throat, and face: Denies mucositis or sore throat Respiratory: Denies cough, dyspnea or wheezes Cardiovascular: Denies palpitation, chest discomfort or lower extremity swelling Gastrointestinal:  Denies nausea, heartburn (+) mild pain at the feeding tube site  Skin: Denies abnormal skin rashes Lymphatics: Denies new lymphadenopathy or easy bruising Neurological: Denies weakness (+) numbness and tingling in all extremities, stable Behavioral/Psych: Mood is stable, no new changes  All other systems were reviewed with the patient and are negative.  PHYSICAL EXAMINATION:  ECOG PERFORMANCE STATUS: 2   Today's Vitals   03/31/18 1323 03/31/18 1333  BP: 116/73   Pulse: 63   Resp: 17   Temp: 98.7 F (37.1 C)   TempSrc: Oral   SpO2: 100%   Weight: 157 lb 12.8  oz (71.6 kg)   Height: 5' 11"  (1.803 m)   PainSc:  0-No pain   GENERAL:alert, no distress and comfortable SKIN: skin color, texture, turgor are normal, no rashes or significant lesions EYES: normal, conjunctiva are pink and non-injected, sclera clear OROPHARYNX:no exudate, no erythema and lips, buccal mucosa, and tongue normal  NECK: supple, thyroid normal size, non-tender, without nodularity LYMPH:  no palpable lymphadenopathy in the cervical, axillary or inguinal LUNGS: clear to auscultation and percussion with normal breathing effort HEART: regular rate & rhythm and no murmurs and no lower extremity edema ABDOMEN:abdomen soft, non-tender and normal bowel sounds (+) feeding tube at LUQ, covered, clean  Musculoskeletal:no cyanosis of digits and no clubbing  PSYCH: alert & oriented x 3 with fluent speech NEURO: no focal motor/sensory deficits  LABORATORY DATA:  I have reviewed the data as listed CBC Latest Ref Rng & Units 03/31/2018 03/17/2018 03/04/2018  WBC 4.0 - 10.5 K/uL 5.7 9.1 10.3  Hemoglobin 13.0 - 17.0 g/dL 10.6(L) 9.9(L) 9.6(L)  Hematocrit 39.0 - 52.0 % 33.8(L) 32.9(L) 29.1(L)  Platelets 150 - 400 K/uL 345 544(H) 565(H)    CMP Latest Ref Rng & Units 03/31/2018 03/17/2018 03/04/2018  Glucose 70 - 99 mg/dL 72 70 126(H)  BUN 6 - 20 mg/dL 14 13 7   Creatinine 0.61 - 1.24 mg/dL 0.69 0.72 0.63  Sodium 135 - 145 mmol/L 140 143 136  Potassium 3.5 - 5.1 mmol/L 4.2 3.8 4.0  Chloride 98 - 111 mmol/L 110 110 103  CO2 22 - 32 mmol/L 26 25 25   Calcium 8.9 - 10.3 mg/dL 8.5(L) 8.6(L) 8.8(L)  Total Protein 6.5 - 8.1 g/dL 6.3(L) 6.2(L) -  Total Bilirubin 0.3 - 1.2 mg/dL 0.4 0.3 -  Alkaline Phos 38 - 126 U/L 59 71 -  AST 15 - 41 U/L 25 24 -  ALT 0 - 44 U/L 23 34 -  CEA 06/13/17: 53.01 07/10/17: 106.44 08/07/17: 31.11 09/04/17: 11.02 10/02/17: 5.64 10/30/17: 4.31 11/27/17: 7.56 12/26/17: 9.46 01/22/2018: 15.75 03/17/2018: 2.42  PATHOLOGY  Diagnosis 06/04/17 Stomach, biopsy, Fundus -  ADENOCARCINOMA - SEE COMMENT Microscopic Comment The stomach biopsy is of an adenocarcinoma arising in a background of intestinal metaplasia. A Warthin-Starry stain is performed to determine the possibility of the presence of Helicobacter pylori. The Warthin-Starry stain is negative for organisms morphologically consistent with Helicobacter pylori.  RADIOGRAPHIC STUDIES: I have personally reviewed the radiological images as listed and agreed with the findings in the report.  12/23/2017 MRI Abdomen IMPRESSION: Ill-defined soft tissue mass in the gastric fundus shows no significant change, consistent known gastric adenocarcinoma.  No definite liver metastases identified. Hemosiderosis noted, with focal area of sparing seen in the right hepatic lobe which accounts for the lesions seen on recent CT.  Stable 1.9 cm probably benign Bosniak category 2 F cystic lesion in right kidney. Recommend continued attention on follow-up imaging.  11/25/2017 CT CAP W Contrast IMPRESSION: 1. Mixed appearance, with the gastric fundal mass thicker than it was on 08/29/2017 (although still improved compared to 06/05/2017), but with reduced local conglomerate adenopathy along the splenic hilum compared to 08/29/2017. 2. At this time I do not see any definite hepatic metastatic lesions. Several lesions of suspicion in the left hepatic lobe and inferiorly in the right hepatic lobe are not readily visible today. There is a small focus of hypodensity adjacent to the gallbladder fossa in segment 7 which is technically nonspecific but which could reflect focal fatty infiltration. 3. Increase in nodular contour of scarring in the left lower lobe. This is probably incidental but I cannot completely exclude the possibility of a localized metastatic lesion. Surveillance of the left lower lobe suggested. 4. Bosniak category 22F cyst of the right kidney lower pole with internal septation and calcification.  Surveillance of this lesion is suggested; I suspect that the patient's cancer surveillance will supersede the typical recommended surveillance schedule for complex cyst. 5. Other imaging findings of potential clinical significance: Aortic Atherosclerosis (ICD10-I70.0). Degenerative glenohumeral arthropathy bilaterally. Mild impingement at L4-5 and L5-S1.  No results found. CT CAP W Contrast 06/05/17 IMPRESSION: 9.1 cm fungating mass in the gastric cardia/posterior gastric fundus, corresponding to the patient's newly diagnosed gastric Cancer. Associated extra gastric extension with peritoneal disease in the left upper abdomen, including a dominant 4.2 cm peritoneal implant. Small volume pelvic ascites. 1.8 cm hypoenhancing lesion along the inferior spleen, indeterminate.  PROCEDURE  Esophagogastroduodenoscopy 06/04/17 by Dr. Benson Norway Findings: The esophagus was normal. A large, fungating and ulcerated, non-circumferential mass with no bleeding and no stigmata of recent bleeding was found in the cardia and in the gastric fundus. Biopsies were taken with a cold forceps for histology. The examined duodenum was normal. A large friable fungating gastric mass involving the cardia and the fundus was identified. The mass extended approximately 5 cm. It was difficult to measure the width of the lesion. There was a large necrotic and deep ulcer in the mid portion of the mass. It is estimated that it encompasses 25% of the fundic circumfirence. Multiple cold biopsies were obtained. Impression: Normal esophagus. Malignant gastric tumor in the cardia and in the  gastric fundus. Biopsied. Normal examined duodenum.  ASSESSMENT & PLAN:  Ryan Collins 57 y.o. male, without significant past medical history, but also does not  see doctors routinely, presented with worsening fatigue, intermittent epigastric pain and melena for 3-4 months, and 50 pounds weight loss over the past  6 months.  1.  Gastric cancer, [id number]  with presumed peritoneal metastasis, adenocarcinoma, HER2(-), MSI-stable, PD-L1positive (5%), [id number] -I previously reviewed his CT scan findings, endoscopy and biopsy results of the gastric mass with patient in details. He has a large mass in the cardia and in the gastric fundus, with evidence of extra gastric extension, and possible peritoneum metastasis on CT scan.  No other distant metastasis. -He started first-line chemotherapy with FOLFOX on 06/26/17, he has been tolerating well so far, s/p 7 months with good partial response  -He subsequently underwent partial gastrostomy, which showed a large 7 cm residual tumor, and 8 positive regional  lymph nodes, nodes were negative. Liver, peritoneum and omental biopsy which all came back negative during the surgery.  No peritoneal metastasis was noticed during the surgery. -I reviewed his surgical pathology findings with patient, case was discussed in our tumor board.  He is certainly at very high risk for recurrence.  Due to his prolonged neoadjuvant chemotherapy, I do not think he would benefit more from adjuvant chemo.  Due to the D2 resection, adjuvant radiation was felt not necessary. -His tumor marker CA has came down to normal after surgery, which is a good sign. -We will start cancer surveillance, plan to repeat scan in 3 to 4 months, and to monitor his labs including CA closely. -He is recovering slowly but well from surgery, he is due on feeding tube, but is eating more. -By the next months, I will see him back in 2 months.   2.  Anemia of iron deficiency and tumor bleeding, B12 deficiency -He has clinical GI bleeding with melena, iron study also showed iron deficiency. -He has received blood transfusion, and 2 dose of IV Feraheme, repeated iron level was adequate.   -We continue to monitor his CBC and iron studies closely -He will continue Oral-B complex   3. History of DVT, ?  Protein C deficiency -he was on coumadin for his DVT before,  and previous labs showed a protein C deficiency, which increases his risk of thrombosis. -We previously discussed the high risk of thrombosis secondary to his underlying malignancy. -Due to his chronic GI bleeding from his tumor, I would not do prophylactic anticoagulation.  4. History of alcohol and marijuana abuse -He has quit drinking alcohol since his diagnosis of cancer -He does smoke marijuana occasionally  5. Weight loss and malnutrition  -He has lost significant weight since the diagnosis -Previously encouraged him to increase his nutrition supplement.  He has been seen by our dietitian, will continue to f/u -he has been on tube fees since surgery, eating better now   6.  Peripheral neuropathy, grade 1, second to chemotherapy oxaliplatin -He has neuropathy on his fingers and toes, overall tolerable, and stable    PLAN: -Surgical pathology findings and lab results reviewed with patient, will continue surveillance -He will see Dr. Barry Dienes next week, and see me back in 2 months.  I will order a restaging scan on next visit.  All questions were answered. The patient knows to call the clinic with any problems, questions or concerns.  I spent 20 minutes counseling the patient face to face. The total time spent in the appointment was 25 minutes and more than 50% was on counseling.     [name], MD 03/31/2018 2:02 PM

## 2018-03-31 NOTE — Telephone Encounter (Signed)
Gave pt avs and calendar  °

## 2018-04-28 ENCOUNTER — Inpatient Hospital Stay: Payer: Medicaid Other | Attending: Hematology

## 2018-04-28 VITALS — BP 114/85 | HR 78 | Temp 98.9°F | Resp 17

## 2018-04-28 DIAGNOSIS — Z452 Encounter for adjustment and management of vascular access device: Secondary | ICD-10-CM | POA: Insufficient documentation

## 2018-04-28 DIAGNOSIS — C786 Secondary malignant neoplasm of retroperitoneum and peritoneum: Secondary | ICD-10-CM | POA: Diagnosis not present

## 2018-04-28 DIAGNOSIS — Z95828 Presence of other vascular implants and grafts: Secondary | ICD-10-CM

## 2018-04-28 DIAGNOSIS — C161 Malignant neoplasm of fundus of stomach: Secondary | ICD-10-CM

## 2018-04-28 DIAGNOSIS — Z9221 Personal history of antineoplastic chemotherapy: Secondary | ICD-10-CM | POA: Diagnosis not present

## 2018-04-28 MED ORDER — HEPARIN SOD (PORK) LOCK FLUSH 100 UNIT/ML IV SOLN
500.0000 [IU] | Freq: Once | INTRAVENOUS | Status: AC
  Administered 2018-04-28: 500 [IU] via INTRAVENOUS
  Filled 2018-04-28: qty 5

## 2018-04-28 MED ORDER — SODIUM CHLORIDE 0.9% FLUSH
10.0000 mL | Freq: Once | INTRAVENOUS | Status: AC
  Administered 2018-04-28: 10 mL via INTRAVENOUS
  Filled 2018-04-28: qty 10

## 2018-04-28 NOTE — Patient Instructions (Signed)
Implanted Port Home Guide An implanted port is a type of central line that is placed under the skin. Central lines are used to provide IV access when treatment or nutrition needs to be given through a person's veins. Implanted ports are used for long-term IV access. An implanted port may be placed because:  You need IV medicine that would be irritating to the small veins in your hands or arms.  You need long-term IV medicines, such as antibiotics.  You need IV nutrition for a long period.  You need frequent blood draws for lab tests.  You need dialysis.  Implanted ports are usually placed in the chest area, but they can also be placed in the upper arm, the abdomen, or the leg. An implanted port has two main parts:  Reservoir. The reservoir is round and will appear as a small, raised area under your skin. The reservoir is the part where a needle is inserted to give medicines or draw blood.  Catheter. The catheter is a thin, flexible tube that extends from the reservoir. The catheter is placed into a large vein. Medicine that is inserted into the reservoir goes into the catheter and then into the vein.  How will I care for my incision site? Do not get the incision site wet. Bathe or shower as directed by your health care provider. How is my port accessed? Special steps must be taken to access the port:  Before the port is accessed, a numbing cream can be placed on the skin. This helps numb the skin over the port site.  Your health care provider uses a sterile technique to access the port. ? Your health care provider must put on a mask and sterile gloves. ? The skin over your port is cleaned carefully with an antiseptic and allowed to dry. ? The port is gently pinched between sterile gloves, and a needle is inserted into the port.  Only "non-coring" port needles should be used to access the port. Once the port is accessed, a blood return should be checked. This helps ensure that the port  is in the vein and is not clogged.  If your port needs to remain accessed for a constant infusion, a clear (transparent) bandage will be placed over the needle site. The bandage and needle will need to be changed every week, or as directed by your health care provider.  Keep the bandage covering the needle clean and dry. Do not get it wet. Follow your health care provider's instructions on how to take a shower or bath while the port is accessed.  If your port does not need to stay accessed, no bandage is needed over the port.  What is flushing? Flushing helps keep the port from getting clogged. Follow your health care provider's instructions on how and when to flush the port. Ports are usually flushed with saline solution or a medicine called heparin. The need for flushing will depend on how the port is used.  If the port is used for intermittent medicines or blood draws, the port will need to be flushed: ? After medicines have been given. ? After blood has been drawn. ? As part of routine maintenance.  If a constant infusion is running, the port may not need to be flushed.  How long will my port stay implanted? The port can stay in for as long as your health care provider thinks it is needed. When it is time for the port to come out, surgery will be   done to remove it. The procedure is similar to the one performed when the port was put in. When should I seek immediate medical care? When you have an implanted port, you should seek immediate medical care if:  You notice a bad smell coming from the incision site.  You have swelling, redness, or drainage at the incision site.  You have more swelling or pain at the port site or the surrounding area.  You have a fever that is not controlled with medicine.  This information is not intended to replace advice given to you by your health care provider. Make sure you discuss any questions you have with your health care provider. Document  Released: 04/30/2005 Document Revised: 10/06/2015 Document Reviewed: 01/05/2013 Elsevier Interactive Patient Education  2017 [employer]  

## 2018-05-23 NOTE — Progress Notes (Signed)
Ryan Collins   Telephone:(336) [phone] Fax:(336) [phone]   Clinic Follow up Note   Patient Care Team: Seward Carol, MD as PCP - General (Internal Medicine)  Date of Service:  05/26/2018  CHIEF COMPLAINT: F/u of gastric cancer  SUMMARY OF ONCOLOGIC HISTORY: Oncology History   Cancer Staging Gastric cancer [facility]) Staging form: Stomach, AJCC 8th Edition - Clinical stage from 06/04/2017: Stage IVB (cT4, cN0, cM1) - Signed by [name], MD on 06/10/2017 - Pathologic stage from 02/25/2018: Stage III (ypT3, pN3a, cM0) - Signed by [name], MD on 04/01/2018       Gastric cancer ([place])   06/03/2017 - 06/06/2017 Hospital Admission    Admitted to the hospital on 06/03/17 with complaints of fatigue and melena. During his stay he underwent an endoscopy that revealed a malignant gastric tumor in the cardia and in the gastric fundus. Biopsy results revealed adenocarcinoma. Pt was discharged on 06/06/17.    06/04/2017 Initial Biopsy    Diagnosis 06/04/17 Stomach, biopsy, Fundus - ADENOCARCINOMA - SEE COMMENT Microscopic Comment The stomach biopsy is of an adenocarcinoma arising in a background of intestinal metaplasia. A Warthin-Starry stain is performed to determine the possibility of the presence of Helicobacter pylori. The Warthin-Starry stain is negative for organisms morphologically consistent with Helicobacter pylori.    06/04/2017 Procedure    Esophagogastroduodenoscopy 06/04/17 by Dr. Benson Norway Impression: Normal esophagus. Malignant gastric tumor in the cardia and in the  gastric fundus. Biopsied. Normal examined duodenum.    06/04/2017 Miscellaneous    HER2 (-) EBV (-) MSI-S PD-L1 CPS 5%    06/05/2017 Imaging    CT CAP W Contrast 06/05/17 IMPRESSION: 9.1 cm fungating mass in the gastric cardia/posterior gastric fundus, corresponding to the patient's newly diagnosed gastric Cancer. Associated extra gastric extension with peritoneal disease in the left upper abdomen,  including a dominant 4.2 cm peritoneal implant. Small volume pelvic ascites. 1.8 cm hypoenhancing lesion along the inferior spleen, indeterminate.    06/10/2017 Initial Diagnosis    Gastric cancer ([place])    06/26/2017 - 01/22/2018 Chemotherapy    First line chemo FOLFOX every 2 weeks     08/29/2017 Imaging    IMPRESSION: 1. Interval decrease in size of fungating ulcerative mass arising from the posterior gastric fundus. 2. Left peritoneal implant, compatible with peritoneal carcinomatosis, has decreased in size from the previous exam. 3. Right lobe of liver lesion present on previous exam is less conspicuous on today's study. A new lesion is identified within the left lobe of liver, suspicious for metastasis. Additional focus of low attenuation within segment 5 adjacent to the gallbladder fossa may represent focal fatty deposition. 4. Similar appearance of mildly complex cyst containing mural calcification and possible internal septation arising from the inferior pole of right kidney. More definitive characterization of this lesion with renal protocol MRI may be helpful. 5.  Mild prostate gland enlargement. 6.  Aortic Atherosclerosis (ICD10-I70.0).    11/25/2017 Imaging    11/25/2017 CT AP W Contrast IMPRESSION: 1. Mixed appearance, with the gastric fundal mass thicker than it was on 08/29/2017 (although still improved compared to 06/05/2017), but with reduced local conglomerate adenopathy along the splenic hilum compared to 08/29/2017. 2. At this time I do not see any definite hepatic metastatic lesions. Several lesions of suspicion in the left hepatic lobe and inferiorly in the right hepatic lobe are not readily visible today. There is a small focus of hypodensity adjacent to the gallbladder fossa in segment 7 which is technically nonspecific but  which could reflect focal fatty infiltration. 3. Increase in nodular contour of scarring in the left lower lobe. This is probably incidental but  I cannot completely exclude the possibility of a localized metastatic lesion. Surveillance of the left lower lobe suggested. 4. Bosniak category 325F cyst of the right kidney lower pole with internal septation and calcification. Surveillance of this lesion is suggested; I suspect that the patient's cancer surveillance will supersede the typical recommended surveillance schedule for complex cyst. 5. Other imaging findings of potential clinical significance: Aortic Atherosclerosis (ICD10-I70.0). Degenerative glenohumeral arthropathy bilaterally. Mild impingement at L4-5 and L5-S1.    12/23/2017 Imaging    12/23/2017 MRI Abdomen IMPRESSION: Ill-defined soft tissue mass in the gastric fundus shows no significant change, consistent known gastric adenocarcinoma.  No definite liver metastases identified. Hemosiderosis noted, with focal area of sparing seen in the right hepatic lobe which accounts for the lesions seen on recent CT.  Stable 1.9 cm probably benign Bosniak category 2 F cystic lesion in right kidney. Recommend continued attention on follow-up imaging.    02/12/2018 Imaging    02/12/2018 CT CAP IMPRESSION: 1. Roughly similar appearance of the mass involving the gastric cardia/fundus. 2. Hepatic steatosis with some areas of fatty sparing as shown on MRI. No definite focal metastatic lesion to the liver is identified. 3. Nonspecific 0.8 cm lymph node between the tail the pancreas in the splenic hilum. There is also a mildly enlarged but stable right hilar lymph node at 1.0 cm in diameter, significance uncertain. 4. Overall stable appearance of the 1.9 cm Bosniak category 325F cyst of the right kidney lower pole. We have now documented 9 months of relative stability. Surveillance at or before 6 months time is recommended. 5. Other imaging findings of potential clinical significance: Aortic Atherosclerosis (ICD10-I70.0). Small anterior pericardial effusion. Stable scarring in the  left lower lobe. Medial hypoenhancement of the upper spleen is probably from early phase of contrast, less likely to be splenic infarct given the relatively normal appearance on delayed images. Impingement at L4-5 and L5-S1.    02/25/2018 Surgery    On 02/25/2018, he had a diagnostic laparoscopy, liver biopsy, total gastrectomy with esophagojejunostomy en bloc with distal pancreatectomy/splenectomy, feeding jejunostomy tube with Dr. Barry Dienes.     02/25/2018 Pathology Results    02/25/2018 Surgical Pathology Diagnosis 1. Soft tissue, biopsy, Diaphragmatic Nodule - NEGATIVE FOR CARCINOMA. 2. Liver, biopsy - LIVER PARENCHYMA, NEGATIVE FOR CARCINOMA. 3. Liver, biopsy, Right - LIVER PARENCHYMA WITH CAPSULAR FIBROSIS. - NEGATIVE FOR CARCINOMA. 4. Soft tissue, biopsy, Falciform Nodule - LIVER PARENCHYMA WITH FIBROTIC BANDS. SEE NOTE. - NEGATIVE FOR CARCINOMA. 5. Omentum, resection for tumor - BENIGN PERITONEUM-LINED ADIPOSE TISSUE. - NEGATIVE FOR CARCINOMA. 6. Stomach, resection for tumor, distal pancreas and spleen - INVASIVE MODERATELY TO POORLY DIFFERENTIATED GASTRIC CARCINOMA, 7 CM. - CARCINOMA INVADES INTO PERIGASTRIC SOFT TISSUE AND IS ADHERENT TO PANCREAS AND SPLEEN BUT DOES NOT INVADE INTO PANCREATIC OR SPLENIC PARENCHYMA. - NO EVIDENCE OF LYMPHOVASCULAR OR PERINEURAL INVASION. - MINIMAL THERAPEUTIC RESPONSE (TUMOR REGRESSION SCORE 3). - METASTATIC CARCINOMA TO EIGHT OF THIRTY-SEVEN LYMPH NODES (8/37). - ALL RESECTION MARGINS ARE NEGATIVE FOR CARCINOMA. - BENIGN UNREMARKABLE PANCREAS. - SEE ONCOLOGY TABLE.    02/25/2018 Cancer Staging    Staging form: Stomach, AJCC 8th Edition - Pathologic stage from 02/25/2018: Stage III (ypT3, pN3a, cM0) - Signed by [name], MD on 04/01/2018      CURRENT THERAPY:  Surveillance   INTERVAL HISTORY:  Ryan Collins is here for a follow up  of gastric cancer. He presents to the clinic today by himself. He notes he is doing well overall. He  denies any new changes. He notes he is still sore and in pain (4-5/10) from surgery. The soreness is exacerbated by movement or eating, but not every time. He is taking tylenol and has been giving liquid Oxycodone  pain medication by Dr. Barry Dienes post-surgery. He ran out of that liquid medication 1-2 months ago. He plans to follow up with Dr. Barry Dienes on 1/20. He has taken Tylenol 2 tabs every 4-6 hours. He has been taking alka seltzer which has helped as well.  He denies nausea or bowel issues. He uses feeding tube every evening at 7pm. He notes his approving is improving. He is able to take care of himself and lives with his brother. He is out of work on disability. He notes numbness in fingers, but denies dropping items and still able to write correctly.    REVIEW OF SYSTEMS:   Constitutional: Denies fevers, chills or abnormal weight loss Eyes: Denies blurriness of vision Ears, nose, mouth, throat, and face: Denies mucositis or sore throat Respiratory: Denies cough, dyspnea or wheezes Cardiovascular: Denies palpitation, chest discomfort or lower extremity swelling Gastrointestinal:  Denies nausea, heartburn (+) RLQ Abdominal soreness/pain Skin: Denies abnormal skin rashes Lymphatics: Denies new lymphadenopathy or easy bruising Neurological:Denies tingling or new weaknesses (+) Numbness in fingers Behavioral/Psych: Mood is stable, no new changes  All other systems were reviewed with the patient and are negative.  MEDICAL HISTORY:  Past Medical History:  Diagnosis Date  . Cancer [facility])    gastric cancer  . DVT (deep venous thrombosis) ([place]) 2011  . GI bleeding 2001  . History of DVT of lower extremity   . Hypertension   . Melena 05/2017  . Stomach ulcer    Clinically suspected; No EGD confirmation as of 06/03/17    SURGICAL HISTORY: Past Surgical History:  Procedure Laterality Date  . ESOPHAGOGASTRODUODENOSCOPY  06/04/2017  . ESOPHAGOGASTRODUODENOSCOPY N/A 06/04/2017   Procedure:  ESOPHAGOGASTRODUODENOSCOPY (EGD);  Surgeon: Carol Ada, MD;  Location: [place];  Service: Endoscopy;  Laterality: N/A;  . GASTRECTOMY N/A 02/25/2018   Procedure: TOTAL GASTRECTOMY WITH J TUBE PLACEMENT;  Surgeon: [name], MD;  Location: [place];  Service: General;  Laterality: N/A;  . IR FLUORO GUIDE PORT INSERTION RIGHT  06/20/2017  . IR US GUIDE VASC ACCESS RIGHT  06/20/2017  . JEJUNOSTOMY N/A 02/25/2018   Procedure: JEJUNOSTOMY TUBE;  Surgeon: [name], MD;  Location: [place];  Service: General;  Laterality: N/A;  . LAPAROSCOPY N/A 02/25/2018   Procedure: LAPAROSCOPY DIAGNOSTIC;  Surgeon: [name], MD;  Location: [place];  Service: General;  Laterality: N/A;  . SPLENECTOMY, TOTAL N/A 02/25/2018   Procedure: SPLENECTOMY;  Surgeon: [name], MD;  Location: [place];  Service: General;  Laterality: N/A;    I have reviewed the social history and family history with the patient and they are unchanged from previous note.  ALLERGIES:  has No Known Allergies.  MEDICATIONS:  Current Outpatient Medications  Medication Sig Dispense Refill  . acetaminophen (TYLENOL) 500 MG tablet Take 1,000 mg by mouth every 6 (six) hours as needed for mild pain.     . Amino Acids-Protein Hydrolys (FEEDING SUPPLEMENT, PRO-STAT SUGAR FREE 64,) LIQD Place 30 mLs into feeding tube 3 (three) times daily. 900 mL 0  . lidocaine-prilocaine (EMLA) cream Apply 1 application topically as needed (for port).    . vitamin B-12 (CYANOCOBALAMIN) 1000 MCG tablet  Take 1 tablet (1,000 mcg total) by mouth daily. 30 tablet 0   No current facility-administered medications for this visit.    Facility-Administered Medications Ordered in Other Visits  Medication Dose Route Frequency Provider Last Rate Last Dose  . sodium chloride flush (NS) 0.9 % injection 10 mL  10 mL Intracatheter PRN [name], MD   10 mL at 01/11/18 1222    PHYSICAL EXAMINATION: ECOG PERFORMANCE STATUS: 1 - Symptomatic but completely  ambulatory  Vitals:   05/26/18 1055  BP: 116/72  Pulse: 74  Resp: 17  Temp: 98.1 F (36.7 C)  SpO2: 99%   Filed Weights   05/26/18 1055  Weight: 156 lb 6.4 oz (70.9 kg)    GENERAL:alert, no distress and comfortable SKIN: skin color, texture, turgor are normal, no rashes or significant lesions EYES: normal, Conjunctiva are pink and non-injected, sclera clear OROPHARYNX:no exudate, no erythema and lips, buccal mucosa, and tongue normal  NECK: supple, thyroid normal size, non-tender, without nodularity LYMPH:  no palpable lymphadenopathy in the cervical, axillary or inguinal LUNGS: clear to auscultation and percussion with normal breathing effort HEART: regular rate & rhythm and no murmurs and no lower extremity edema ABDOMEN:abdomen soft, non-tender and normal bowel sounds (+) J-tube in place, clean (+) Surgical incision healed well Musculoskeletal:no cyanosis of digits and no clubbing  NEURO: alert & oriented x 3 with fluent speech, no focal motor/sensory deficits  LABORATORY DATA:  I have reviewed the data as listed CBC Latest Ref Rng & Units 05/26/2018 03/31/2018 03/17/2018  WBC 4.0 - 10.5 K/uL 7.4 5.7 9.1  Hemoglobin 13.0 - 17.0 g/dL 11.8(L) 10.6(L) 9.9(L)  Hematocrit 39.0 - 52.0 % 35.4(L) 33.8(L) 32.9(L)  Platelets 150 - 400 K/uL 313 345 544(H)     CMP Latest Ref Rng & Units 05/26/2018 03/31/2018 03/17/2018  Glucose 70 - 99 mg/dL 94 72 70  BUN 6 - 20 mg/dL 18 14 13   Creatinine 0.61 - 1.24 mg/dL 0.68 0.69 0.72  Sodium 135 - 145 mmol/L 140 140 143  Potassium 3.5 - 5.1 mmol/L 4.4 4.2 3.8  Chloride 98 - 111 mmol/L 104 110 110  CO2 22 - 32 mmol/L 31 26 25   Calcium 8.9 - 10.3 mg/dL 9.0 8.5(L) 8.6(L)  Total Protein 6.5 - 8.1 g/dL 7.0 6.3(L) 6.2(L)  Total Bilirubin 0.3 - 1.2 mg/dL 0.3 0.4 0.3  Alkaline Phos 38 - 126 U/L 77 59 71  AST 15 - 41 U/L 35 25 24  ALT 0 - 44 U/L 41 23 34      RADIOGRAPHIC STUDIES: I have personally reviewed the radiological images as listed and  agreed with the findings in the report. No results found.   ASSESSMENT & PLAN:  Ryan Collins is a 58 y.o. male with   1.  Gastric cancer, T4bNxM1 with presumed peritoneal metastasis, adenocarcinoma, HER2(-), MSI-stable, PD-L1positive (5%), ypT3N3aM0 -He was diagnosed in 05/2017. He is s/p total gastrectomy with esophagojejunostomy en bloc with distal pancreatectomy/splenectomy with J-tube in place.   -Due to his prolonged neoadjuvant chemotherapy with FOLOX, I do not think he would benefit more from adjuvant chemo. Due to the D2 resection, adjuvant radiation was also not recommended  -He still has moderate abdominal pain and soreness post surgery. He will follow up with Dr. Barry Dienes on 1/20. He is taking tylenol as needed  -Next CT scan before next visit.  -F/u in 6 weeks   2. Anemia of iron deficiency and tumor bleeding, B12 deficiency -He has clinical GI bleeding with melena,  iron study also showed iron deficiency. -He has received blood transfusion, and 3 dose of IV Feraheme, responded well.  -We continue to monitor his CBC and iron studies closely -Hg at 11.8 today (05/26/2018) -He will continue Oral-B complex   3. History of DVT, ?  Protein C deficiency -He was on coumadin for his DVT before, and previous labs showed a protein C deficiency, which increases his risk of thrombosis. -We previously discussed the high risk of thrombosis secondary to his underlying malignancy. -Due to his chronic GI bleeding from his tumor, I would not do prophylactic anticoagulation.  4. History of alcohol and marijuana abuse -He has quit drinking alcohol since his diagnosis of cancer -He does smoke marijuana occasionally  5. Weight loss and malnutrition  -He has lost significant weight since the diagnosis -Will continue to f/u with our dietitian -He still has been tube feeding every night at 7pm. He is also eating better by mouth and weight is stable.   6. 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   7. Abdominal pain -Secondary to surgery  -He completed his prescription of oxycodone given by Dr. Barry Dienes. He has been taking 2 tabs of Tylenol every 4-6 hours.  -Due to his previous substance abuse, I will be cautious about narcotics.  He seems to be able to control his pain. -He will follow up with Dr. Barry Dienes on 1/20   PLAN: -F/u in 6 weeks  -With lab and CT CAP with contrast a few days before   No problem-specific Assessment & Plan notes found for this encounter.   Orders Placed This Encounter  Procedures  . CT Abdomen Pelvis W Contrast    Standing Status:   Future    Standing Expiration Date:   05/26/2019    Order Specific Question:   If indicated for the ordered procedure, I authorize the administration of contrast media per Radiology protocol    Answer:   Yes    Order Specific Question:   Preferred imaging location?    Answer:   [facility]    Order Specific Question:   Is Oral Contrast requested for this exam?    Answer:   Yes, Per Radiology protocol    Order Specific Question:   Radiology Contrast Protocol - do NOT remove file path    Answer:   \\[email or web address]  . CT Chest W Contrast    Standing Status:   Future    Standing Expiration Date:   05/26/2019    Order Specific Question:   If indicated for the ordered procedure, I authorize the administration of contrast media per Radiology protocol    Answer:   Yes    Order Specific Question:   Preferred imaging location?    Answer:   [facility]    Order Specific Question:   Radiology Contrast Protocol - do NOT remove file path    Answer:   \\[email or web address]  . Vitamin B12    Standing Status:   Standing    Number of Occurrences:   20    Standing Expiration Date:   05/26/2023   All questions were answered. The patient knows to call the clinic with any  problems, questions or concerns. No barriers to learning was detected. I spent 20 minutes counseling the patient face to face. The total time spent in the appointment was 25 minutes and more than 50% was on counseling and review of  test results     [name], MD 05/26/2018   I, Joslyn Devon, am acting as scribe for [name], MD.   I have reviewed the above documentation for accuracy and completeness, and I agree with the above.

## 2018-05-26 ENCOUNTER — Inpatient Hospital Stay (HOSPITAL_BASED_OUTPATIENT_CLINIC_OR_DEPARTMENT_OTHER): Payer: Medicaid Other | Admitting: Hematology

## 2018-05-26 ENCOUNTER — Inpatient Hospital Stay: Payer: Medicaid Other | Attending: Hematology

## 2018-05-26 ENCOUNTER — Other Ambulatory Visit: Payer: Self-pay | Admitting: Hematology

## 2018-05-26 ENCOUNTER — Inpatient Hospital Stay: Payer: Medicaid Other

## 2018-05-26 ENCOUNTER — Telehealth: Payer: Self-pay | Admitting: Hematology

## 2018-05-26 ENCOUNTER — Encounter: Payer: Self-pay | Admitting: Hematology

## 2018-05-26 VITALS — BP 116/72 | HR 74 | Temp 98.1°F | Resp 17 | Ht 71.0 in | Wt 156.4 lb

## 2018-05-26 DIAGNOSIS — Z79899 Other long term (current) drug therapy: Secondary | ICD-10-CM | POA: Insufficient documentation

## 2018-05-26 DIAGNOSIS — Z903 Acquired absence of stomach [part of]: Secondary | ICD-10-CM

## 2018-05-26 DIAGNOSIS — F1010 Alcohol abuse, uncomplicated: Secondary | ICD-10-CM | POA: Diagnosis not present

## 2018-05-26 DIAGNOSIS — Z980 Intestinal bypass and anastomosis status: Secondary | ICD-10-CM

## 2018-05-26 DIAGNOSIS — D500 Iron deficiency anemia secondary to blood loss (chronic): Secondary | ICD-10-CM

## 2018-05-26 DIAGNOSIS — R109 Unspecified abdominal pain: Secondary | ICD-10-CM | POA: Diagnosis not present

## 2018-05-26 DIAGNOSIS — Z7901 Long term (current) use of anticoagulants: Secondary | ICD-10-CM | POA: Diagnosis not present

## 2018-05-26 DIAGNOSIS — E538 Deficiency of other specified B group vitamins: Secondary | ICD-10-CM | POA: Diagnosis not present

## 2018-05-26 DIAGNOSIS — C786 Secondary malignant neoplasm of retroperitoneum and peritoneum: Secondary | ICD-10-CM | POA: Insufficient documentation

## 2018-05-26 DIAGNOSIS — Z934 Other artificial openings of gastrointestinal tract status: Secondary | ICD-10-CM

## 2018-05-26 DIAGNOSIS — I10 Essential (primary) hypertension: Secondary | ICD-10-CM

## 2018-05-26 DIAGNOSIS — M4854XA Collapsed vertebra, not elsewhere classified, thoracic region, initial encounter for fracture: Secondary | ICD-10-CM | POA: Insufficient documentation

## 2018-05-26 DIAGNOSIS — D509 Iron deficiency anemia, unspecified: Secondary | ICD-10-CM | POA: Diagnosis not present

## 2018-05-26 DIAGNOSIS — C161 Malignant neoplasm of fundus of stomach: Secondary | ICD-10-CM

## 2018-05-26 DIAGNOSIS — Z86718 Personal history of other venous thrombosis and embolism: Secondary | ICD-10-CM

## 2018-05-26 DIAGNOSIS — R599 Enlarged lymph nodes, unspecified: Secondary | ICD-10-CM | POA: Insufficient documentation

## 2018-05-26 DIAGNOSIS — F1210 Cannabis abuse, uncomplicated: Secondary | ICD-10-CM | POA: Insufficient documentation

## 2018-05-26 DIAGNOSIS — Z95828 Presence of other vascular implants and grafts: Secondary | ICD-10-CM | POA: Insufficient documentation

## 2018-05-26 DIAGNOSIS — Z9221 Personal history of antineoplastic chemotherapy: Secondary | ICD-10-CM | POA: Insufficient documentation

## 2018-05-26 LAB — CBC WITH DIFFERENTIAL (CANCER CENTER ONLY)
ABS IMMATURE GRANULOCYTES: 0.02 10*3/uL (ref 0.00–0.07)
Basophils Absolute: 0 10*3/uL (ref 0.0–0.1)
Basophils Relative: 0 %
EOS PCT: 0 %
Eosinophils Absolute: 0 10*3/uL (ref 0.0–0.5)
HCT: 35.4 % — ABNORMAL LOW (ref 39.0–52.0)
Hemoglobin: 11.8 g/dL — ABNORMAL LOW (ref 13.0–17.0)
Immature Granulocytes: 0 %
Lymphocytes Relative: 39 %
Lymphs Abs: 2.9 10*3/uL (ref 0.7–4.0)
MCH: 28.2 pg (ref 26.0–34.0)
MCHC: 33.3 g/dL (ref 30.0–36.0)
MCV: 84.7 fL (ref 80.0–100.0)
MONO ABS: 0.8 10*3/uL (ref 0.1–1.0)
MONOS PCT: 11 %
NEUTROS ABS: 3.7 10*3/uL (ref 1.7–7.7)
Neutrophils Relative %: 50 %
PLATELETS: 313 10*3/uL (ref 150–400)
RBC: 4.18 MIL/uL — AB (ref 4.22–5.81)
RDW: 17.2 % — ABNORMAL HIGH (ref 11.5–15.5)
WBC: 7.4 10*3/uL (ref 4.0–10.5)
nRBC: 0 % (ref 0.0–0.2)

## 2018-05-26 LAB — CMP (CANCER CENTER ONLY)
ALK PHOS: 77 U/L (ref 38–126)
ALT: 41 U/L (ref 0–44)
AST: 35 U/L (ref 15–41)
Albumin: 3.8 g/dL (ref 3.5–5.0)
Anion gap: 5 (ref 5–15)
BILIRUBIN TOTAL: 0.3 mg/dL (ref 0.3–1.2)
BUN: 18 mg/dL (ref 6–20)
CO2: 31 mmol/L (ref 22–32)
CREATININE: 0.68 mg/dL (ref 0.61–1.24)
Calcium: 9 mg/dL (ref 8.9–10.3)
Chloride: 104 mmol/L (ref 98–111)
GFR, Est AFR Am: >60 mL/min (ref >60)
GFR, Estimated: >60 mL/min (ref >60)
GLUCOSE: 94 mg/dL (ref 70–99)
POTASSIUM: 4.4 mmol/L (ref 3.5–5.1)
Sodium: 140 mmol/L (ref 135–145)
TOTAL PROTEIN: 7 g/dL (ref 6.5–8.1)

## 2018-05-26 LAB — IRON AND TIBC
IRON: 53 ug/dL (ref 42–163)
Saturation Ratios: 19 % — ABNORMAL LOW (ref 20–55)
TIBC: 284 ug/dL (ref 202–409)
UIBC: 230 ug/dL (ref 117–376)

## 2018-05-26 LAB — CEA (IN HOUSE-CHCC): CEA (CHCC-In House): 23.96 ng/mL — ABNORMAL HIGH (ref 0.00–5.00)

## 2018-05-26 LAB — FERRITIN: FERRITIN: 58 ng/mL (ref 24–336)

## 2018-05-26 LAB — VITAMIN B12: Vitamin B-12: 132 pg/mL — ABNORMAL LOW (ref 180–914)

## 2018-05-26 MED ORDER — HEPARIN SOD (PORK) LOCK FLUSH 100 UNIT/ML IV SOLN
500.0000 [IU] | Freq: Once | INTRAVENOUS | Status: AC
  Administered 2018-05-26: 500 [IU]
  Filled 2018-05-26: qty 5

## 2018-05-26 MED ORDER — SODIUM CHLORIDE 0.9% FLUSH
10.0000 mL | Freq: Once | INTRAVENOUS | Status: AC
  Administered 2018-05-26: 10 mL
  Filled 2018-05-26: qty 10

## 2018-05-26 NOTE — Telephone Encounter (Signed)
Printed calendar and avs. °

## 2018-05-27 ENCOUNTER — Telehealth: Payer: Self-pay | Admitting: Hematology

## 2018-05-27 ENCOUNTER — Telehealth: Payer: Self-pay

## 2018-05-27 NOTE — Telephone Encounter (Signed)
Spoke with patient re next appointment for 1/23. Patient will get updated schedule at 1/23 visit.

## 2018-05-27 NOTE — Telephone Encounter (Signed)
Spoke with patient regarding lab results.  Per Dr. Burr Medico B12 level is very low, will set up monthly B12 injections.  Tumor marker has increased significantly, will do CT scan sooner, within next 2 weeks.  Instructed patient he should receive phone calls to get these scheduled.  Patient verbalized an understanding.

## 2018-05-27 NOTE — Telephone Encounter (Signed)
-----   Message from [name], MD sent at 05/26/2018  8:34 PM EST ----- Please let pt know that his B12 level is very low, and I will set up B12 injection monthly. His tumor marker has significantly increased, will do CT scan sooner, in the next 2 weeks, I will send schedule message, thanks   [name]  05/26/2018

## 2018-06-03 ENCOUNTER — Telehealth: Payer: Self-pay | Admitting: Hematology

## 2018-06-03 NOTE — Telephone Encounter (Signed)
Scheduled appt per 1/21 sch message - pt is aware of appt date and time   

## 2018-06-03 NOTE — Progress Notes (Signed)
  Oncology Nurse Navigator Documentation  Navigator Location: [facility] (06/03/18 1515)   Navigator Encounter Type: Other (06/03/18 1515)                             Interventions: Referrals(Per Dr. Marlowe Aschoff request. Patient's J tube fell out. ) (06/03/18 1515) Referrals: Nutrition/dietician (06/03/18 1515)          Acuity: Level 1 (06/03/18 1515)         Time Spent with Patient: 15 (06/03/18 1515)

## 2018-06-05 ENCOUNTER — Other Ambulatory Visit: Payer: Self-pay | Admitting: Medical

## 2018-06-05 ENCOUNTER — Inpatient Hospital Stay: Payer: Medicaid Other

## 2018-06-05 ENCOUNTER — Ambulatory Visit (HOSPITAL_COMMUNITY)
Admission: RE | Admit: 2018-06-05 | Discharge: 2018-06-05 | Disposition: A | Discharge status: Home / Self Care | Payer: Medicaid Other | Source: Ambulatory Visit | Attending: Hematology | Admitting: Hematology

## 2018-06-05 ENCOUNTER — Inpatient Hospital Stay (HOSPITAL_BASED_OUTPATIENT_CLINIC_OR_DEPARTMENT_OTHER): Payer: Medicaid Other | Admitting: Medical

## 2018-06-05 VITALS — BP 132/92 | HR 78 | Temp 98.1°F | Resp 18

## 2018-06-05 VITALS — BP 156/92 | HR 75

## 2018-06-05 VITALS — BP 152/91 | HR 84 | Temp 98.9°F | Resp 16 | Ht 71.0 in | Wt 155.7 lb

## 2018-06-05 DIAGNOSIS — Z934 Other artificial openings of gastrointestinal tract status: Secondary | ICD-10-CM

## 2018-06-05 DIAGNOSIS — C161 Malignant neoplasm of fundus of stomach: Secondary | ICD-10-CM

## 2018-06-05 DIAGNOSIS — Z7901 Long term (current) use of anticoagulants: Secondary | ICD-10-CM

## 2018-06-05 DIAGNOSIS — R109 Unspecified abdominal pain: Secondary | ICD-10-CM

## 2018-06-05 DIAGNOSIS — M546 Pain in thoracic spine: Secondary | ICD-10-CM

## 2018-06-05 DIAGNOSIS — Z95828 Presence of other vascular implants and grafts: Secondary | ICD-10-CM

## 2018-06-05 DIAGNOSIS — E860 Dehydration: Secondary | ICD-10-CM

## 2018-06-05 DIAGNOSIS — Z903 Acquired absence of stomach [part of]: Secondary | ICD-10-CM

## 2018-06-05 DIAGNOSIS — I10 Essential (primary) hypertension: Secondary | ICD-10-CM

## 2018-06-05 DIAGNOSIS — Z980 Intestinal bypass and anastomosis status: Secondary | ICD-10-CM

## 2018-06-05 DIAGNOSIS — F1210 Cannabis abuse, uncomplicated: Secondary | ICD-10-CM

## 2018-06-05 DIAGNOSIS — Z9221 Personal history of antineoplastic chemotherapy: Secondary | ICD-10-CM

## 2018-06-05 DIAGNOSIS — M4854XA Collapsed vertebra, not elsewhere classified, thoracic region, initial encounter for fracture: Secondary | ICD-10-CM

## 2018-06-05 DIAGNOSIS — C786 Secondary malignant neoplasm of retroperitoneum and peritoneum: Secondary | ICD-10-CM | POA: Diagnosis not present

## 2018-06-05 DIAGNOSIS — D72823 Leukemoid reaction: Secondary | ICD-10-CM

## 2018-06-05 DIAGNOSIS — Z79899 Other long term (current) drug therapy: Secondary | ICD-10-CM

## 2018-06-05 DIAGNOSIS — R531 Weakness: Secondary | ICD-10-CM

## 2018-06-05 DIAGNOSIS — D500 Iron deficiency anemia secondary to blood loss (chronic): Secondary | ICD-10-CM

## 2018-06-05 DIAGNOSIS — Z86718 Personal history of other venous thrombosis and embolism: Secondary | ICD-10-CM

## 2018-06-05 DIAGNOSIS — E538 Deficiency of other specified B group vitamins: Secondary | ICD-10-CM

## 2018-06-05 DIAGNOSIS — D509 Iron deficiency anemia, unspecified: Secondary | ICD-10-CM

## 2018-06-05 DIAGNOSIS — E876 Hypokalemia: Secondary | ICD-10-CM

## 2018-06-05 DIAGNOSIS — Z7189 Other specified counseling: Secondary | ICD-10-CM

## 2018-06-05 DIAGNOSIS — K1379 Other lesions of oral mucosa: Secondary | ICD-10-CM

## 2018-06-05 DIAGNOSIS — F1010 Alcohol abuse, uncomplicated: Secondary | ICD-10-CM

## 2018-06-05 DIAGNOSIS — R599 Enlarged lymph nodes, unspecified: Secondary | ICD-10-CM

## 2018-06-05 LAB — CMP (CANCER CENTER ONLY)
ALT: 127 U/L — ABNORMAL HIGH (ref 0–44)
AST: 232 U/L (ref 15–41)
Albumin: 3.7 g/dL (ref 3.5–5.0)
Alkaline Phosphatase: 92 U/L (ref 38–126)
Anion gap: 10 (ref 5–15)
BUN: 6 mg/dL (ref 6–20)
CO2: 32 mmol/L (ref 22–32)
CREATININE: 0.71 mg/dL (ref 0.61–1.24)
Calcium: 9.1 mg/dL (ref 8.9–10.3)
Chloride: 98 mmol/L (ref 98–111)
GFR, Est AFR Am: >60 mL/min (ref >60)
GFR, Estimated: >60 mL/min (ref >60)
Glucose, Bld: 94 mg/dL (ref 70–99)
Potassium: 3.6 mmol/L (ref 3.5–5.1)
Sodium: 140 mmol/L (ref 135–145)
Total Bilirubin: 0.7 mg/dL (ref 0.3–1.2)
Total Protein: 6.9 g/dL (ref 6.5–8.1)

## 2018-06-05 LAB — CBC WITH DIFFERENTIAL (CANCER CENTER ONLY)
Abs Immature Granulocytes: 0.15 10*3/uL — ABNORMAL HIGH (ref 0.00–0.07)
Basophils Absolute: 0 10*3/uL (ref 0.0–0.1)
Basophils Relative: 0 %
Eosinophils Absolute: 0 10*3/uL (ref 0.0–0.5)
Eosinophils Relative: 0 %
HCT: 34.1 % — ABNORMAL LOW (ref 39.0–52.0)
HEMOGLOBIN: 11.5 g/dL — AB (ref 13.0–17.0)
Immature Granulocytes: 1 %
LYMPHS ABS: 3.1 10*3/uL (ref 0.7–4.0)
LYMPHS PCT: 24 %
MCH: 28.6 pg (ref 26.0–34.0)
MCHC: 33.7 g/dL (ref 30.0–36.0)
MCV: 84.8 fL (ref 80.0–100.0)
MONOS PCT: 11 %
Monocytes Absolute: 1.4 10*3/uL — ABNORMAL HIGH (ref 0.1–1.0)
Neutro Abs: 8.1 10*3/uL — ABNORMAL HIGH (ref 1.7–7.7)
Neutrophils Relative %: 64 %
Platelet Count: 371 10*3/uL (ref 150–400)
RBC: 4.02 MIL/uL — ABNORMAL LOW (ref 4.22–5.81)
RDW: 18.6 % — ABNORMAL HIGH (ref 11.5–15.5)
WBC Count: 12.7 10*3/uL — ABNORMAL HIGH (ref 4.0–10.5)
nRBC: 0.2 % (ref 0.0–0.2)

## 2018-06-05 MED ORDER — IOHEXOL 300 MG/ML  SOLN
100.0000 mL | Freq: Once | INTRAMUSCULAR | Status: AC | PRN
  Administered 2018-06-05: 100 mL via INTRAVENOUS

## 2018-06-05 MED ORDER — CYANOCOBALAMIN 1000 MCG/ML IJ SOLN
1000.0000 ug | Freq: Once | INTRAMUSCULAR | Status: AC
  Administered 2018-06-05: 1000 ug via INTRAMUSCULAR

## 2018-06-05 MED ORDER — CYANOCOBALAMIN 1000 MCG/ML IJ SOLN
INTRAMUSCULAR | Status: AC
  Filled 2018-06-05: qty 1

## 2018-06-05 MED ORDER — MAGIC MOUTHWASH W/LIDOCAINE: 5.0000 mL | Freq: Four times a day (QID) | ORAL | 2 refills | Status: AC | PRN

## 2018-06-05 MED ORDER — SODIUM CHLORIDE 0.9% FLUSH
10.0000 mL | Freq: Once | INTRAVENOUS | Status: AC
  Administered 2018-06-05: 10 mL
  Filled 2018-06-05: qty 10

## 2018-06-05 MED ORDER — HEPARIN SOD (PORK) LOCK FLUSH 100 UNIT/ML IV SOLN
500.0000 [IU] | Freq: Once | INTRAVENOUS | Status: AC
  Administered 2018-06-05: 500 [IU]
  Filled 2018-06-05: qty 5

## 2018-06-05 MED ORDER — SODIUM CHLORIDE 0.9 % IV SOLN
Freq: Once | INTRAVENOUS | Status: AC
  Administered 2018-06-05: 17:00:00 via INTRAVENOUS
  Filled 2018-06-05: qty 250

## 2018-06-05 MED ORDER — SODIUM CHLORIDE (PF) 0.9 % IJ SOLN
INTRAMUSCULAR | Status: AC
  Filled 2018-06-05: qty 50

## 2018-06-05 MED ORDER — CYCLOBENZAPRINE HCL 10 MG PO TABS: 10.0000 mg | ORAL_TABLET | Freq: Three times a day (TID) | ORAL | 1 refills | Status: DC | PRN

## 2018-06-05 MED ORDER — AMOXICILLIN-POT CLAVULANATE 600-42.9 MG/5ML PO SUSR: 600.0000 mg | Freq: Two times a day (BID) | ORAL | 0 refills | Status: DC

## 2018-06-05 NOTE — Progress Notes (Signed)
Pt seen by PA Van only, no RN assessment at this time.  PA aware. 

## 2018-06-05 NOTE — Patient Instructions (Signed)

## 2018-06-05 NOTE — Patient Instructions (Signed)

## 2018-06-09 ENCOUNTER — Inpatient Hospital Stay: Payer: Medicaid Other | Admitting: Nutrition

## 2018-06-09 NOTE — Progress Notes (Addendum)
[facility] Progress Note   Ryan Collins [id number] 07-28-1960 58 y.o.  Ryan Collins is managed by Dr, [name]  Actively treated with chemotherapy/immunotherapy/hormonal therapy: no   Assessment: Plan:    Pathologic compression fracture of thoracic vertebra, initial encounter [facility]) - Plan: MR THORACIC SPINE W WO CONTRAST  Dehydration - Plan: 0.9 %  sodium chloride infusion  Oral tenderness - Plan: magic mouthwash w/lidocaine SOLN  Acute midline thoracic back pain - Plan: cyclobenzaprine (FLEXERIL) 10 MG tablet  Leukemoid reaction - Plan: amoxicillin-clavulanate (AUGMENTIN ES-600) 600-42.9 MG/5ML suspension  Malignant neoplasm of fundus of stomach ([place])  Please see After Visit Summary for patient specific instructions.  Future Appointments  Date Time Provider [place]  07/03/2018  8:00 AM [facility] 4 [facility] None  07/03/2018  8:30 AM [facility] [place] FLUSH [facility] None  07/07/2018 10:15 AM [name], MD [facility] None  07/11/2018  9:00 AM Lanae Boast, FNP [facility] None  07/31/2018  2:15 PM [facility] [place] FL[facility] None    Orders Placed This Encounter  Procedures  . MR THORACIC SPINE W WO CONTRAST       Subjective:   Patient ID:  Ryan Collins is a 58 y.o. (DOB [birth year]) male.  Chief Complaint: No chief complaint on file.   HPI Ryan Collins is a 58 year old male with a history of gastric cancer who is managed by Dr. Burr Medico and was last seen on 05/26/2018.  At that visit the patient reported that he was doing well except for some abdominal pain/soreness following his surgery.  The soreness was exacerbated by movement and eating.  He reported that he was treating this with Tylenol and with Alka-Seltzer which helped.  He continued to use a feeding tube daily.  He presents to the office today as a walk-in patient stating that he is weak and would like to have his labs checked.  His CBC returned with a WBC of 12.7, hemoglobin 11.5,  hematocrit 34.1, and platelet count of 371.  He has been having back pain.  He denies any changes in activity or trauma.  He has been having oral soreness.  The patient reports that he had restaging CT scans completed today which showed the following:  1. Interval gastrectomy with esophagojejunostomy in the lower chest. Splenectomy and partial pancreatectomy. 2. At least 35 new pulmonary nodules are present, measuring up to 1.5 cm in diameter, high suspicion for metastatic disease. 3. Portacaval and porta hepatis adenopathy, suspicious for malignancy. 4. Although I do not see a definite liver tumor, sensitivity for small lesions is mildly adversely affected due to the late arterial phase of contrast administration. 5. New thoracic spine compression fractures at T6, T7, T2, T3, and T4 as detailed above. New lower sternal manubrial fracture. 6. Right inguinal hernia containing a loop of small bowel, no overt obstruction or strangulation currently evident. 7. Diffuse mild subcutaneous and mesenteric edema, cause uncertain. 8. Other imaging findings of potential clinical significance: Aortic Atherosclerosis (ICD10-I70.0). Lower lumbar spondylosis and degenerative disc disease.  Medications: I have reviewed the patient's current medications.  Allergies: No Known Allergies  Past Medical History:  Diagnosis Date  . [facility])    gastric cancer  . DVT (deep venous thrombosis) ([place]) 2011  . GI bleeding 2001  . History of DVT of lower extremity   . Hypertension   . Melena 05/2017  . Stomach ulcer    Clinically suspected; No EGD confirmation as of 06/03/17    Past Surgical History:  Procedure  Laterality Date  . ESOPHAGOGASTRODUODENOSCOPY  06/04/2017  . ESOPHAGOGASTRODUODENOSCOPY N/A 06/04/2017   Procedure: ESOPHAGOGASTRODUODENOSCOPY (EGD);  Surgeon: Carol Ada, MD;  Location: [place];  Service: Endoscopy;  Laterality: N/A;  . GASTRECTOMY N/A 02/25/2018   Procedure: TOTAL GASTRECTOMY  WITH J TUBE PLACEMENT;  Surgeon: [name], MD;  Location: [place];  Service: General;  Laterality: N/A;  . IR FLUORO GUIDE PORT INSERTION RIGHT  06/20/2017  . IR US GUIDE VASC ACCESS RIGHT  06/20/2017  . JEJUNOSTOMY N/A 02/25/2018   Procedure: JEJUNOSTOMY TUBE;  Surgeon: [name], MD;  Location: [place];  Service: General;  Laterality: N/A;  . LAPAROSCOPY N/A 02/25/2018   Procedure: LAPAROSCOPY DIAGNOSTIC;  Surgeon: [name], MD;  Location: [place];  Service: General;  Laterality: N/A;  . SPLENECTOMY, TOTAL N/A 02/25/2018   Procedure: SPLENECTOMY;  Surgeon: [name], MD;  Location: [place];  Service: General;  Laterality: N/A;    Family History  Problem Relation Age of Onset  . Hypertension Mother   . Cancer Sister 68       breast    Social History   Socioeconomic History  . Marital status: Legally Separated    Spouse name: Not on file  . Number of children: Not on file  . Years of education: Not on file  . Highest education level: Not on file  Occupational History  . Not on file  Social Needs  . Financial resource strain: Not on file  . Food insecurity:    Worry: Not on file    Inability: Not on file  . Transportation needs:    Medical: Not on file    Non-medical: Not on file  Tobacco Use  . Smoking status: Never Smoker  . Smokeless tobacco: Never Used  Substance and Sexual Activity  . Alcohol use: Yes    Alcohol/week: 5.0 standard drinks    Types: 5 Cans of beer per week    Comment: 05/2017 i QUIT DRINKING 4 MONTHS AGO "  . Drug use: Yes    Frequency: 2.0 times per week    Types: Marijuana    Comment: last use: 02/17/2018  . Sexual activity: Not on file  Lifestyle  . Physical activity:    Days per week: Not on file    Minutes per session: Not on file  . Stress: Not on file  Relationships  . Social connections:    Talks on phone: Not on file    Gets together: Not on file    Attends religious service: Not on file    Active member of club or  organization: Not on file    Attends meetings of clubs or organizations: Not on file    Relationship status: Not on file  . Intimate partner violence:    Fear of current or ex partner: Not on file    Emotionally abused: Not on file    Physically abused: Not on file    Forced sexual activity: Not on file  Other Topics Concern  . Not on file  Social History Narrative  . Not on file    Past Medical History, Surgical history, Social history, and Family history were reviewed and updated as appropriate.   Please see review of systems for further details on the patient's review from today.   Review of Systems:  Review of Systems  Constitutional: Negative for appetite change, chills, diaphoresis and fever.  HENT: Positive for mouth sores. Negative for sore throat and trouble swallowing.   Respiratory: Negative for  cough, shortness of breath and wheezing.   Cardiovascular: Negative for chest pain and palpitations.  Gastrointestinal: Negative for constipation, diarrhea, nausea and vomiting.  Genitourinary: Negative for difficulty urinating.  Musculoskeletal: Positive for back pain. Negative for arthralgias and myalgias.  Neurological: Positive for weakness.    Objective:   Physical Exam:  BP (!) 152/91 (BP Location: Left Arm, Patient Position: Sitting) Comment: Notified Nurse of BP  Pulse 84   Temp 98.9 F (37.2 C) (Oral)   Resp 16   Ht _0  (1.803 m)   Wt 155 lb 11.2 oz (70.6 kg)   SpO2 100%   BMI 21.72 kg/m  ECOG: 1  Physical Exam Constitutional:      General: He is not in acute distress.    Appearance: He is not diaphoretic.  HENT:     Head: Normocephalic.     Nose:     Comments: There is an area of ulceration on the right lateral tip of the tongue.  There is also an area of bleeding and a hematoma under the tongue. Eyes:     General: No scleral icterus.       Right eye: No discharge.        Left eye: No discharge.  Neck:     Musculoskeletal: Normal range of  motion and neck supple.  Cardiovascular:     Rate and Rhythm: Normal rate and regular rhythm.     Heart sounds: Normal heart sounds. No murmur. No friction rub. No gallop.   Pulmonary:     Effort: Pulmonary effort is normal. No respiratory distress.     Breath sounds: Normal breath sounds. No stridor. No wheezing or rales.  Chest:     Chest wall: No tenderness.  Musculoskeletal:        General: Tenderness (Tenderness over the thoracic spine.) present.  Lymphadenopathy:     Cervical: No cervical adenopathy.  Skin:    General: Skin is warm and dry.  Neurological:     Mental Status: He is alert.     Coordination: Coordination normal.     Lab Review:     Component Value Date/Time   NA 140 06/05/2018 1522   K 3.6 06/05/2018 1522   CL 98 06/05/2018 1522   CO2 32 06/05/2018 1522   GLUCOSE 94 06/05/2018 1522   BUN 6 06/05/2018 1522   CREATININE 0.71 06/05/2018 1522   CALCIUM 9.1 06/05/2018 1522   PROT 6.9 06/05/2018 1522   ALBUMIN 3.7 06/05/2018 1522   AST 232 (HH) 06/05/2018 1522   ALT 127 (H) 06/05/2018 1522   ALKPHOS 92 06/05/2018 1522   BILITOT 0.7 06/05/2018 1522   GFRNONAA >60 06/05/2018 1522   GFRAA >60 06/05/2018 1522       Component Value Date/Time   WBC 12.7 (H) 06/05/2018 1522   WBC 10.3 03/04/2018 1235   RBC 4.02 (L) 06/05/2018 1522   HGB 11.5 (L) 06/05/2018 1522   HCT 34.1 (L) 06/05/2018 1522   PLT 371 06/05/2018 1522   MCV 84.8 06/05/2018 1522   MCH 28.6 06/05/2018 1522   MCHC 33.7 06/05/2018 1522   RDW 18.6 (H) 06/05/2018 1522   LYMPHSABS 3.1 06/05/2018 1522   MONOABS 1.4 (H) 06/05/2018 1522   EOSABS 0.0 06/05/2018 1522   BASOSABS 0.0 06/05/2018 1522   -------------------------------  Imaging from last 24 hours (if applicable):  Radiology interpretation: Ct Chest W Contrast  Result Date: 06/05/2018 CLINICAL DATA:  Gastric tumor, surgically removed, prior chemotherapy, restaging EXAM: CT CHEST, ABDOMEN,  AND PELVIS WITH CONTRAST TECHNIQUE:  Multidetector CT imaging of the chest, abdomen and pelvis was performed following the standard protocol during bolus administration of intravenous contrast. CONTRAST:  117m OMNIPAQUE IOHEXOL 300 MG/ML  SOLN COMPARISON:  Multiple exams, including 02/12/2018 FINDINGS: CT CHEST FINDINGS Cardiovascular: Right subclavian central venous catheter tip: Right atrium. Atherosclerotic calcification of the aortic arch. Mediastinum/Nodes: The esophagojejunostomy is in the lower chest. No appreciable adenopathy. Lungs/Pleura: At least 35 new pulmonary nodules are present scattered in the lungs. An index right lower lobe nodule measures 1.5 by 1.2 cm on image 84/4. The nodules are highly suspicious for malignancy. Stable scarring in the right lower lobe. Musculoskeletal: Interval fracture of the lower sternal manubrium. Interval 65% compression fracture at T6 with 4 mm posterior bony retropulsion. Interval 35% compression fracture of the T7 vertebral level. Interval superior endplate compression fractures at T2, T3, and T4. CT ABDOMEN PELVIS FINDINGS Hepatobiliary: Mildly heterogeneous enhancement of the liver is probably attributable to the early contrast phase. There is some accentuated density on the late arterial phase images adjacent to the gallbladder fossa for example on image 58/2. A well-defined mass is not observed. Pancreas: Poor definition of the pancreatic tail, compatible with distal pancreatectomy. Spleen: Splenectomy. Adrenals/Urinary Tract: Unremarkable Stomach/Bowel: Gastrectomy. An indirect right inguinal hernia contains a loop of small bowel, the a variant loop is upper normal sized and the if aright loop is small in caliber. Normal appendix. Vascular/Lymphatic: Minimal aortoiliac atherosclerotic vascular disease. Portacaval node 2.0 cm in short axis on image 63/2. Additional porta hepatis adenopathy suspected. Reproductive: Unremarkable Other: Diffuse subcutaneous and mesenteric edema. Musculoskeletal: Grade  1 degenerative anterolisthesis at L4-5 with lower lumbar spondylosis and degenerative disc disease. Indirect right inguinal hernia is noted above. IMPRESSION: 1. Interval gastrectomy with esophagojejunostomy in the lower chest. Splenectomy and partial pancreatectomy. 2. At least 35 new pulmonary nodules are present, measuring up to 1.5 cm in diameter, high suspicion for metastatic disease. 3. Portacaval and porta hepatis adenopathy, suspicious for malignancy. 4. Although I do not see a definite liver tumor, sensitivity for small lesions is mildly adversely affected due to the late arterial phase of contrast administration. 5. New thoracic spine compression fractures at T6, T7, T2, T3, and T4 as detailed above. New lower sternal manubrial fracture. 6. Right inguinal hernia containing a loop of small bowel, no overt obstruction or strangulation currently evident. 7. Diffuse mild subcutaneous and mesenteric edema, cause uncertain. 8. Other imaging findings of potential clinical significance: Aortic Atherosclerosis (ICD10-I70.0). Lower lumbar spondylosis and degenerative disc disease. Electronically Signed   By: WVan ClinesM.D.   On: 06/05/2018 14:47   Ct Abdomen Pelvis W Contrast  Result Date: 06/05/2018 CLINICAL DATA:  Gastric tumor, surgically removed, prior chemotherapy, restaging EXAM: CT CHEST, ABDOMEN, AND PELVIS WITH CONTRAST TECHNIQUE: Multidetector CT imaging of the chest, abdomen and pelvis was performed following the standard protocol during bolus administration of intravenous contrast. CONTRAST:  1048mOMNIPAQUE IOHEXOL 300 MG/ML  SOLN COMPARISON:  Multiple exams, including 02/12/2018 FINDINGS: CT CHEST FINDINGS Cardiovascular: Right subclavian central venous catheter tip: Right atrium. Atherosclerotic calcification of the aortic arch. Mediastinum/Nodes: The esophagojejunostomy is in the lower chest. No appreciable adenopathy. Lungs/Pleura: At least 35 new pulmonary nodules are present scattered  in the lungs. An index right lower lobe nodule measures 1.5 by 1.2 cm on image 84/4. The nodules are highly suspicious for malignancy. Stable scarring in the right lower lobe. Musculoskeletal: Interval fracture of the lower sternal manubrium. Interval 65% compression fracture at T6  with 4 mm posterior bony retropulsion. Interval 35% compression fracture of the T7 vertebral level. Interval superior endplate compression fractures at T2, T3, and T4. CT ABDOMEN PELVIS FINDINGS Hepatobiliary: Mildly heterogeneous enhancement of the liver is probably attributable to the early contrast phase. There is some accentuated density on the late arterial phase images adjacent to the gallbladder fossa for example on image 58/2. A well-defined mass is not observed. Pancreas: Poor definition of the pancreatic tail, compatible with distal pancreatectomy. Spleen: Splenectomy. Adrenals/Urinary Tract: Unremarkable Stomach/Bowel: Gastrectomy. An indirect right inguinal hernia contains a loop of small bowel, the a variant loop is upper normal sized and the if aright loop is small in caliber. Normal appendix. Vascular/Lymphatic: Minimal aortoiliac atherosclerotic vascular disease. Portacaval node 2.0 cm in short axis on image 63/2. Additional porta hepatis adenopathy suspected. Reproductive: Unremarkable Other: Diffuse subcutaneous and mesenteric edema. Musculoskeletal: Grade 1 degenerative anterolisthesis at L4-5 with lower lumbar spondylosis and degenerative disc disease. Indirect right inguinal hernia is noted above. IMPRESSION: 1. Interval gastrectomy with esophagojejunostomy in the lower chest. Splenectomy and partial pancreatectomy. 2. At least 35 new pulmonary nodules are present, measuring up to 1.5 cm in diameter, high suspicion for metastatic disease. 3. Portacaval and porta hepatis adenopathy, suspicious for malignancy. 4. Although I do not see a definite liver tumor, sensitivity for small lesions is mildly adversely affected due  to the late arterial phase of contrast administration. 5. New thoracic spine compression fractures at T6, T7, T2, T3, and T4 as detailed above. New lower sternal manubrial fracture. 6. Right inguinal hernia containing a loop of small bowel, no overt obstruction or strangulation currently evident. 7. Diffuse mild subcutaneous and mesenteric edema, cause uncertain. 8. Other imaging findings of potential clinical significance: Aortic Atherosclerosis (ICD10-I70.0). Lower lumbar spondylosis and degenerative disc disease. Electronically Signed   By:  Clines M.D.   On: 06/05/2018 14:47        This patient was seen with Dr. Burr Medico with my treatment plan reviewed with her. She expressed agreement with my medical management of this patient.   Addendum I have seen the patient, examined him. I agree with the assessment and and plan and have edited the notes.   Mr. Cozzens presented with worsening weakness. His feeding tube felt out a few days ago. I reviewed his restaging CT images myself and discussed with patient.  Unfortunately, he has developed numerous new pulmonary nodules, largest 1.5 cm, highly suspicious for metastatic disease.  He also developed new thoracic spine compression fractures at different level, I recommend a thoracic MRI for further evaluation, and ruled out bone metastasis. I would like to get a tissue biopsy to confirm metastasis, but it may be difficult. I discussed restarting chemo. Due to his moderately neuropathy, FOLFOX is probably not an option. I discussed the option of weekly Taxol and Cyramza, or FOLFIRI. His tumor is HER2(-), MSS, PD-L1 5%, so Keytruda would be solely option. After discussion with pt, will proceed with weekly Taxol and Cyramza first, we discussed the potential side effects, especially worsening neuropathy, hair loss, hypertension, hemorrhage or thrombosis, he agrees to proceed. The goal of therapy is palliative, to prolong his life. Will start him in a few  weeks.   [name]  05/06/2019

## 2018-06-09 NOTE — Progress Notes (Signed)
DISCONTINUE ON PATHWAY REGIMEN - Gastroesophageal     A cycle is every 14 days:     Oxaliplatin      Leucovorin      5-Fluorouracil      5-Fluorouracil   **Always confirm dose/schedule in your pharmacy ordering system**  REASON: Disease Progression PRIOR TREATMENT: GEOS3: mFOLFOX6 q14 Days Until Progression or Unacceptable Toxicity TREATMENT RESPONSE: Unable to Evaluate  START ON PATHWAY REGIMEN - Gastroesophageal     A cycle is every 28 days:     Ramucirumab      Paclitaxel   **Always confirm dose/schedule in your pharmacy ordering system**  Patient Characteristics: Distant Metastases (cM1/pM1) / Locally Recurrent Disease, Adenocarcinoma - Esophageal, GE Junction, and Gastric, Second Line, MSS / pMMR or MSI Unknown Histology: Adenocarcinoma Disease Classification: Gastric Therapeutic Status: Distant Metastases (No Additional Staging) Line of Therapy: Second Line Microsatellite/Mismatch Repair Status: MSS/pMMR Intent of Therapy: Non-Curative / Palliative Intent, Discussed with Patient

## 2018-06-09 NOTE — Progress Notes (Signed)
58 year old male diagnosed with gastric cancer status post total gastrectomy on 02-25-2018. Consult requested to evaluate need for J-tube replacement.  Past medical history includes ulcer, hypertension, DVT, GI bleed, alcohol and marijuana, and J-tube placement.  Medications include vitamin B12.  Labs include B12 132 on January 13.  Height: 5 feet 11 inches. Weight: 157 pounds January 27. Usual body weight: 180 pounds May 22. BMI: 21.90.  Patient reports that he is eating without difficulty. This morning he ate a sausage biscuit, hash brown, and coffee. He denies nausea, vomiting, constipation, and diarrhea. Weight has been stable over the last several weeks. He has tried oral nutrition supplements but reports he gets diarrhea occasionally from them.  Nutrition diagnosis:  Unintended weight loss related to gastric cancer as evidenced by 13% weight loss over 9 months.  Intervention: Educated patient on strategies for eating smaller more frequent meals and snacks. Educated patient on reducing concentrated sweets and drinking no more than 4 ounces of liquid at mealtimes. Recommended patient try another oral nutrition supplement and provided coupons and samples.  Encouraged him to consume only 4 ounces at a time. Educated patient on the importance of weight maintenance. Fact sheets were provided.  Questions were answered and teach back method used.  Monitoring, evaluation, goals: Patient will tolerate adequate calories and protein to minimize weight loss without feeding tube placement.  Next visit: to be scheduled as needed.  **Disclaimer: This note was dictated with voice recognition software. Similar sounding words can inadvertently be transcribed and this note may contain transcription errors which may not have been corrected upon publication of note.**

## 2018-06-12 ENCOUNTER — Telehealth: Payer: Self-pay | Admitting: Hematology

## 2018-06-12 ENCOUNTER — Other Ambulatory Visit: Payer: Self-pay | Admitting: Hematology

## 2018-06-12 NOTE — Telephone Encounter (Signed)
Scheduled appt per 1/27 and 1/30 sch message - pt is aware of appt date and time

## 2018-06-13 ENCOUNTER — Encounter: Payer: Self-pay | Admitting: Hematology

## 2018-06-13 ENCOUNTER — Telehealth: Payer: Self-pay | Admitting: *Deleted

## 2018-06-13 ENCOUNTER — Ambulatory Visit (HOSPITAL_COMMUNITY)
Admission: RE | Admit: 2018-06-13 | Discharge: 2018-06-13 | Disposition: A | Discharge status: Home / Self Care | Payer: Medicaid Other | Source: Ambulatory Visit | Attending: Medical | Admitting: Medical

## 2018-06-13 ENCOUNTER — Inpatient Hospital Stay (HOSPITAL_BASED_OUTPATIENT_CLINIC_OR_DEPARTMENT_OTHER): Payer: Medicaid Other | Admitting: Hematology

## 2018-06-13 VITALS — BP 142/95 | HR 95 | Temp 98.1°F | Resp 17 | Ht 71.0 in | Wt 155.4 lb

## 2018-06-13 DIAGNOSIS — E538 Deficiency of other specified B group vitamins: Secondary | ICD-10-CM | POA: Diagnosis not present

## 2018-06-13 DIAGNOSIS — Z79899 Other long term (current) drug therapy: Secondary | ICD-10-CM

## 2018-06-13 DIAGNOSIS — I10 Essential (primary) hypertension: Secondary | ICD-10-CM

## 2018-06-13 DIAGNOSIS — C161 Malignant neoplasm of fundus of stomach: Secondary | ICD-10-CM

## 2018-06-13 DIAGNOSIS — Z9221 Personal history of antineoplastic chemotherapy: Secondary | ICD-10-CM

## 2018-06-13 DIAGNOSIS — F1210 Cannabis abuse, uncomplicated: Secondary | ICD-10-CM

## 2018-06-13 DIAGNOSIS — Z86718 Personal history of other venous thrombosis and embolism: Secondary | ICD-10-CM

## 2018-06-13 DIAGNOSIS — C786 Secondary malignant neoplasm of retroperitoneum and peritoneum: Secondary | ICD-10-CM | POA: Diagnosis not present

## 2018-06-13 DIAGNOSIS — D509 Iron deficiency anemia, unspecified: Secondary | ICD-10-CM

## 2018-06-13 DIAGNOSIS — Z7901 Long term (current) use of anticoagulants: Secondary | ICD-10-CM

## 2018-06-13 DIAGNOSIS — M4854XA Collapsed vertebra, not elsewhere classified, thoracic region, initial encounter for fracture: Secondary | ICD-10-CM | POA: Diagnosis not present

## 2018-06-13 DIAGNOSIS — D500 Iron deficiency anemia secondary to blood loss (chronic): Secondary | ICD-10-CM

## 2018-06-13 DIAGNOSIS — F1010 Alcohol abuse, uncomplicated: Secondary | ICD-10-CM

## 2018-06-13 DIAGNOSIS — R599 Enlarged lymph nodes, unspecified: Secondary | ICD-10-CM

## 2018-06-13 DIAGNOSIS — R109 Unspecified abdominal pain: Secondary | ICD-10-CM

## 2018-06-13 MED ORDER — GADOBUTROL 1 MMOL/ML IV SOLN
7.0000 mL | Freq: Once | INTRAVENOUS | Status: AC | PRN
  Administered 2018-06-13: 7 mL via INTRAVENOUS

## 2018-06-13 NOTE — Telephone Encounter (Signed)
"  Ryan Collins with MRI [phone]).  We need Lenus Trauger to come back per radiologist for more views.  Would like alternative number to call.  Unable to reach him or leave a message.."  No new numbers.  Rocco Rapozo's mom is listed ROI.  Ryan Collins will "Try to reach mom.  He's scheduled to see Dr. Burr Medico at 2:30 pm today.  Ask him if he has talked with or  returned to MRI when he see's Dr. Burr Medico.  If he has not, tell him to return to MRI.  Will take about ten minutes to get more thoracic MRI views."

## 2018-06-13 NOTE — Telephone Encounter (Signed)
"  Bonnie with MRI.  [name] here for Thoracic MRI.  Does he need a regular thoracic spine?  [facility] protocol is totally different.  Order note reads [place] protocol.  Do not see anything about stereotactic surgery."  Verbal order received and read back from Sandi Mealy PA for regular thoracic spine to be done.  Order given to [place] at this time.

## 2018-06-13 NOTE — Progress Notes (Signed)
Ryan Collins   [phone] Fax:([phone]) [phone]   Clinic Follow up Note   Patient Care Team: Seward Carol, MD as PCP - General (Internal Medicine)  Date of Service:  06/13/2018  CHIEF COMPLAINT: F/u of gastric cancer   SUMMARY OF ONCOLOGIC HISTORY: Oncology History   Cancer Staging Gastric cancer [facility]) Staging form: Stomach, AJCC 8th Edition - Clinical stage from 06/04/2017: Stage IVB (cT4, cN0, cM1) - Signed by [name], MD on 06/10/2017 - Pathologic stage from 02/25/2018: Stage III (ypT3, pN3a, cM0) - Signed by [name], MD on 04/01/2018       Gastric cancer ([place])   06/03/2017 - 06/06/2017 Hospital Admission    Admitted to the hospital on 06/03/17 with complaints of fatigue and melena. During his stay he underwent an endoscopy that revealed a malignant gastric tumor in the cardia and in the gastric fundus. Biopsy results revealed adenocarcinoma. Pt was discharged on 06/06/17.    06/04/2017 Initial Biopsy    Diagnosis 06/04/17 Stomach, biopsy, Fundus - ADENOCARCINOMA - SEE COMMENT Microscopic Comment The stomach biopsy is of an adenocarcinoma arising in a background of intestinal metaplasia. A Warthin-Starry stain is performed to determine the possibility of the presence of Helicobacter pylori. The Warthin-Starry stain is negative for organisms morphologically consistent with Helicobacter pylori.    06/04/2017 Procedure    Esophagogastroduodenoscopy 06/04/17 by Dr. Benson Norway Impression: Normal esophagus. Malignant gastric tumor in the cardia and in the  gastric fundus. Biopsied. Normal examined duodenum.    06/04/2017 Miscellaneous    HER2 (-) EBV (-) MSI-S PD-L1 CPS 5%    06/05/2017 Imaging    CT CAP W Contrast 06/05/17 IMPRESSION: 9.1 cm fungating mass in the gastric cardia/posterior gastric fundus, corresponding to the patient's newly diagnosed gastric Cancer. Associated extra gastric extension with peritoneal disease in the left upper abdomen,  including a dominant 4.2 cm peritoneal implant. Small volume pelvic ascites. 1.8 cm hypoenhancing lesion along the inferior spleen, indeterminate.    06/10/2017 Initial Diagnosis    Gastric cancer ([place])    06/26/2017 - 01/22/2018 Chemotherapy    First line chemo FOLFOX every 2 weeks     08/29/2017 Imaging    IMPRESSION: 1. Interval decrease in size of fungating ulcerative mass arising from the posterior gastric fundus. 2. Left peritoneal implant, compatible with peritoneal carcinomatosis, has decreased in size from the previous exam. 3. Right lobe of liver lesion present on previous exam is less conspicuous on today's study. A new lesion is identified within the left lobe of liver, suspicious for metastasis. Additional focus of low attenuation within segment 5 adjacent to the gallbladder fossa may represent focal fatty deposition. 4. Similar appearance of mildly complex cyst containing mural calcification and possible internal septation arising from the inferior pole of right kidney. More definitive characterization of this lesion with renal protocol MRI may be helpful. 5.  Mild prostate gland enlargement. 6.  Aortic Atherosclerosis (ICD10-I70.0).    11/25/2017 Imaging    11/25/2017 CT AP W Contrast IMPRESSION: 1. Mixed appearance, with the gastric fundal mass thicker than it was on 08/29/2017 (although still improved compared to 06/05/2017), but with reduced local conglomerate adenopathy along the splenic hilum compared to 08/29/2017. 2. At this time I do not see any definite hepatic metastatic lesions. Several lesions of suspicion in the left hepatic lobe and inferiorly in the right hepatic lobe are not readily visible today. There is a small focus of hypodensity adjacent to the gallbladder fossa in segment 7 which is technically nonspecific  but which could reflect focal fatty infiltration. 3. Increase in nodular contour of scarring in the left lower lobe. This is probably incidental but  I cannot completely exclude the possibility of a localized metastatic lesion. Surveillance of the left lower lobe suggested. 4. Bosniak category 17F cyst of the right kidney lower pole with internal septation and calcification. Surveillance of this lesion is suggested; I suspect that the patient's cancer surveillance will supersede the typical recommended surveillance schedule for complex cyst. 5. Other imaging findings of potential clinical significance: Aortic Atherosclerosis (ICD10-I70.0). Degenerative glenohumeral arthropathy bilaterally. Mild impingement at L4-5 and L5-S1.    12/23/2017 Imaging    12/23/2017 MRI Abdomen IMPRESSION: Ill-defined soft tissue mass in the gastric fundus shows no significant change, consistent known gastric adenocarcinoma.  No definite liver metastases identified. Hemosiderosis noted, with focal area of sparing seen in the right hepatic lobe which accounts for the lesions seen on recent CT.  Stable 1.9 cm probably benign Bosniak category 2 F cystic lesion in right kidney. Recommend continued attention on follow-up imaging.    02/12/2018 Imaging    02/12/2018 CT CAP IMPRESSION: 1. Roughly similar appearance of the mass involving the gastric cardia/fundus. 2. Hepatic steatosis with some areas of fatty sparing as shown on MRI. No definite focal metastatic lesion to the liver is identified. 3. Nonspecific 0.8 cm lymph node between the tail the pancreas in the splenic hilum. There is also a mildly enlarged but stable right hilar lymph node at 1.0 cm in diameter, significance uncertain. 4. Overall stable appearance of the 1.9 cm Bosniak category 17F cyst of the right kidney lower pole. We have now documented 9 months of relative stability. Surveillance at or before 6 months time is recommended. 5. Other imaging findings of potential clinical significance: Aortic Atherosclerosis (ICD10-I70.0). Small anterior pericardial effusion. Stable scarring in the  left lower lobe. Medial hypoenhancement of the upper spleen is probably from early phase of contrast, less likely to be splenic infarct given the relatively normal appearance on delayed images. Impingement at L4-5 and L5-S1.    02/25/2018 Surgery    On 02/25/2018, he had a diagnostic laparoscopy, liver biopsy, total gastrectomy with esophagojejunostomy en bloc with distal pancreatectomy/splenectomy, feeding jejunostomy tube with Dr. Barry Dienes.     02/25/2018 Pathology Results    02/25/2018 Surgical Pathology Diagnosis 1. Soft tissue, biopsy, Diaphragmatic Nodule - NEGATIVE FOR CARCINOMA. 2. Liver, biopsy - LIVER PARENCHYMA, NEGATIVE FOR CARCINOMA. 3. Liver, biopsy, Right - LIVER PARENCHYMA WITH CAPSULAR FIBROSIS. - NEGATIVE FOR CARCINOMA. 4. Soft tissue, biopsy, Falciform Nodule - LIVER PARENCHYMA WITH FIBROTIC BANDS. SEE NOTE. - NEGATIVE FOR CARCINOMA. 5. Omentum, resection for tumor - BENIGN PERITONEUM-LINED ADIPOSE TISSUE. - NEGATIVE FOR CARCINOMA. 6. Stomach, resection for tumor, distal pancreas and spleen - INVASIVE MODERATELY TO POORLY DIFFERENTIATED GASTRIC CARCINOMA, 7 CM. - CARCINOMA INVADES INTO PERIGASTRIC SOFT TISSUE AND IS ADHERENT TO PANCREAS AND SPLEEN BUT DOES NOT INVADE INTO PANCREATIC OR SPLENIC PARENCHYMA. - NO EVIDENCE OF LYMPHOVASCULAR OR PERINEURAL INVASION. - MINIMAL THERAPEUTIC RESPONSE (TUMOR REGRESSION SCORE 3). - METASTATIC CARCINOMA TO EIGHT OF THIRTY-SEVEN LYMPH NODES (8/37). - ALL RESECTION MARGINS ARE NEGATIVE FOR CARCINOMA. - BENIGN UNREMARKABLE PANCREAS. - SEE ONCOLOGY TABLE.    02/25/2018 Cancer Staging    Staging form: Stomach, AJCC 8th Edition - Pathologic stage from 02/25/2018: Stage III (ypT3, pN3a, cM0) - Signed by [name], MD on 04/01/2018    06/05/2018 Imaging    CT CAP W COntrast 06/05/18  IMPRESSION: 1. Interval gastrectomy with esophagojejunostomy in  the lower chest. Splenectomy and partial pancreatectomy. 2. At least 35 new  pulmonary nodules are present, measuring up to 1.5 cm in diameter, high suspicion for metastatic disease. 3. Portacaval and porta hepatis adenopathy, suspicious for malignancy. 4. Although I do not see a definite liver tumor, sensitivity for small lesions is mildly adversely affected due to the late arterial phase of contrast administration. 5. New thoracic spine compression fractures at T6, T7, T2, T3, and T4 as detailed above. New lower sternal manubrial fracture. 6. Right inguinal hernia containing a loop of small bowel, no overt obstruction or strangulation currently evident. 7. Diffuse mild subcutaneous and mesenteric edema, cause uncertain. 8. Other imaging findings of potential clinical significance: Aortic Atherosclerosis (ICD10-I70.0). Lower lumbar spondylosis and degenerative disc disease.    06/16/2018 -  Chemotherapy    The patient had PACLitaxel (TAXOL) 150 mg in sodium chloride 0.9 % 250 mL chemo infusion (</= 69m/m2), 80 mg/m2 = 150 mg, Intravenous,  Once, 0 of 6 cycles ramucirumab (CYRAMZA) 600 mg in sodium chloride 0.9 % 190 mL chemo infusion, 8 mg/kg = 600 mg, Intravenous, Once, 0 of 6 cycles  for chemotherapy treatment.       CURRENT THERAPY:  PENDING Taxol and Cyramza   INTERVAL HISTORY:  ECasimiro Lienhardis here for a follow up of gastric cancer and review latest scan. He presents to the clinic today by himself. He notes he is doing well. He has pain in mid lumber spine. He has been taking flexeril will mild relief, and also taking Tylenol and Alkazaltzer. He notes numbness in fingers and toes. He is still able to be active and take care of himself. He appetite and eating is improving.  He notes his stomach pain is intermittent    REVIEW OF SYSTEMS:   Constitutional: Denies fevers, chills or abnormal weight loss Eyes: Denies blurriness of vision Ears, nose, mouth, throat, and face: Denies mucositis or sore throat Respiratory: Denies cough, dyspnea or  wheezes Cardiovascular: Denies palpitation, chest discomfort or lower extremity swelling Gastrointestinal:  Denies nausea, heartburn or change in bowel habits (+0 intermittent abdominal pain  Skin: Denies abnormal skin rashes MSK: (+) Mid lumbar back pain  Lymphatics: Denies new lymphadenopathy or easy bruising Neurological:Denies numbness, tingling or new weaknesses Behavioral/Psych: Mood is stable, no new changes  All other systems were reviewed with the patient and are negative.  MEDICAL HISTORY:  Past Medical History:  Diagnosis Date  . Cancer ([facility]    gastric cancer  . DVT (deep [place]) [place]  . GI bleeding 2001  . History of DVT of lower extremity   . Hypertension   . Melena 05/2017  . Stomach ulcer    Clinically suspected; No EGD confirmation as of 06/03/17    SURGICAL HISTORY: Past Surgical History:  Procedure Laterality Date  . ESOPHAGOGASTRODUODENOSCOPY  06/04/2017  . ESOPHAGOGASTRODUODENOSCOPY N/A 06/04/2017   Procedure: ESOPHAGOGASTRODUODENOSCOPY (EGD);  Surgeon: HCarol Ada MD;  [place]  Service: Endoscopy;  Laterality: N/A;  . GASTRECTOMY N/A 02/25/2018   Procedure: TOTAL GASTRECTOMY WITH J TUBE PLACEMENT;  Surgeon: BStark Klein MD;  [place]  Service: General;  Laterality: N/A;  . IR FLUORO GUIDE PORT INSERTION RIGHT  06/20/2017  . IR [place] VASC ACCESS RIGHT  06/20/2017  . JEJUNOSTOMY N/A 02/25/2018   Procedure: JEJUNOSTOMY TUBE;  Surgeon: BStark Klein MD;  [place]  Service: General;  Laterality: N/A;  . LAPAROSCOPY N/A 02/25/2018   Procedure: LAPAROSCOPY DIAGNOSTIC;  Surgeon: BStark Klein  MD;  Location: [place];  Service: General;  Laterality: N/A;  . SPLENECTOMY, TOTAL N/A 02/25/2018   Procedure: SPLENECTOMY;  Surgeon: [name], MD;  Location: [place];  Service: General;  Laterality: N/A;    I have reviewed the social history and family history with the patient and they are unchanged from previous  note.  ALLERGIES:  has No Known Allergies.  MEDICATIONS:  Current Outpatient Medications  Medication Sig Dispense Refill  . acetaminophen (TYLENOL) 500 MG tablet Take 1,000 mg by mouth every 6 (six) hours as needed for mild pain.     . Amino Acids-Protein Hydrolys (FEEDING SUPPLEMENT, PRO-STAT SUGAR FREE 64,) LIQD Place 30 mLs into feeding tube 3 (three) times daily. 900 mL 0  . amoxicillin-clavulanate (AUGMENTIN ES-600) 600-42.9 MG/5ML suspension Take 5 mLs (600 mg total) by mouth 2 (two) times daily. 75 mL 0  . cyclobenzaprine (FLEXERIL) 10 MG tablet Take 1 tablet (10 mg total) by mouth 3 (three) times daily as needed for muscle spasms. 30 tablet 1  . lidocaine-prilocaine (EMLA) cream Apply 1 application topically as needed (for port).    . magic mouthwash w/lidocaine SOLN Take 5 mLs by mouth 4 (four) times daily as needed for mouth pain. 240 mL 2  . vitamin B-12 (CYANOCOBALAMIN) 1000 MCG tablet Take 1 tablet (1,000 mcg total) by mouth daily. 30 tablet 0   No current facility-administered medications for this visit.     PHYSICAL EXAMINATION: ECOG PERFORMANCE STATUS: 1 - Symptomatic but completely ambulatory  Vitals:   06/13/18 1423 06/13/18 1435  BP: (!) 145/105 (!) 142/95  Pulse: 95   Resp: 17   Temp: 98.1 F (36.7 C)   SpO2: 100%    Filed Weights   06/13/18 1423  Weight: 155 lb 6.4 oz (70.5 kg)    GENERAL:alert, no distress and comfortable SKIN: skin color, texture, turgor are normal, no rashes or significant lesions EYES: normal, Conjunctiva are pink and non-injected, sclera clear OROPHARYNX:no exudate, no erythema and lips, buccal mucosa, and tongue normal  NECK: supple, thyroid normal size, non-tender, without nodularity LYMPH:  no palpable lymphadenopathy in the cervical, axillary or inguinal LUNGS: clear to auscultation and percussion with normal breathing effort HEART: regular rate & rhythm and no murmurs and no lower extremity edema ABDOMEN:abdomen soft,  non-tender and normal bowel sounds Musculoskeletal:no cyanosis of digits and no clubbing  NEURO: alert & oriented x 3 with fluent speech, no focal motor/sensory deficits  LABORATORY DATA:  I have reviewed the data as listed CBC Latest Ref Rng & Units 06/05/2018 05/26/2018 03/31/2018  WBC 4.0 - 10.5 K/uL 12.7(H) 7.4 5.7  Hemoglobin 13.0 - 17.0 g/dL 11.5(L) 11.8(L) 10.6(L)  Hematocrit 39.0 - 52.0 % 34.1(L) 35.4(L) 33.8(L)  Platelets 150 - 400 K/uL 371 313 345     CMP Latest Ref Rng & Units 06/05/2018 05/26/2018 03/31/2018  Glucose 70 - 99 mg/dL 94 94 72  BUN 6 - 20 mg/dL 6 18 14   Creatinine 0.61 - 1.24 mg/dL 0.71 0.68 0.69  Sodium 135 - 145 mmol/L 140 140 140  Potassium 3.5 - 5.1 mmol/L 3.6 4.4 4.2  Chloride 98 - 111 mmol/L 98 104 110  CO2 22 - 32 mmol/L 32 31 26  Calcium 8.9 - 10.3 mg/dL 9.1 9.0 8.5(L)  Total Protein 6.5 - 8.1 g/dL 6.9 7.0 6.3(L)  Total Bilirubin 0.3 - 1.2 mg/dL 0.7 0.3 0.4  Alkaline Phos 38 - 126 U/L 92 77 59  AST 15 - 41 U/L 232(HH) 35 25  ALT 0 - 44 U/L 127(H) 41 23      RADIOGRAPHIC STUDIES: I have personally reviewed the radiological images as listed and agreed with the findings in the report. Mr Thoracic Spine W Wo Contrast  Result Date: 06/13/2018 CLINICAL DATA:  History of gastric cancer, mid back pain, compression fractures EXAM: MRI THORACIC WITHOUT AND WITH CONTRAST TECHNIQUE: Multiplanar and multiecho pulse sequences of the thoracic spine were obtained without and with intravenous contrast. CONTRAST:  7 mL Gadavist COMPARISON:  CT chest 06/05/2018, 02/12/2018 FINDINGS: MRI THORACIC SPINE FINDINGS Alignment:  Physiologic. Vertebrae: No discitis or osteomyelitis. Chronic T2, T3 T8, T9 and T10 vertebral body compression fractures without marrow edema. T6 and T7 vertebral body compression fractures without significant marrow edema within the vertebral bodies and without enhancing lesion within either vertebral body. Approximately 70% at T6 vertebral body height  loss. More focal sclerosis in the anterior aspect of the T6 vertebral body likely secondary to the degree of compression. Approximately 60% T7 vertebral body height loss. 3 mm retropulsion of the posterior margin of the T6 vertebral body mildly impressing on the thecal sac. No aggressive osseous lesion. Cord:  Normal signal and morphology. Paraspinal and other soft tissues: No acute paraspinal abnormality. Bilateral pulmonary nodules consistent with metastatic disease. Disc levels: Disc spaces: Degenerative disc disease with disc height loss at T3-4, T4-5. T1-T2: No disc protrusion, foraminal stenosis or central canal stenosis. T2-T3: No disc protrusion, foraminal stenosis or central canal stenosis. T3-T4: Minimal broad-based disc bulge. No foraminal or central canal stenosis. T4-T5: Mild broad-based disc bulge. No foraminal or central canal stenosis. T5-T6: No disc protrusion, foraminal stenosis or central canal stenosis. T6-T7: No disc protrusion, foraminal stenosis or central canal stenosis. T7-T8: No disc protrusion, foraminal stenosis or central canal stenosis. T8-T9: Mild broad-based disc bulge. No foraminal or central canal stenosis. T9-T10: Mild broad-based disc bulge. No foraminal or central canal stenosis. T10-T11: Minimal broad-based disc bulge. No foraminal or central canal stenosis. T11-T12: No disc protrusion, foraminal stenosis or central canal stenosis. IMPRESSION: 1. Chronic T6 and T7 vertebral body compression fractures. No aggressive osseous lesion to suggest malignancy. These likely reflect osteoporotic compression fractures given the chronic compression fractures at multiple other levels. If there is further clinical concern regarding underlying malignancy, follow-up MRI of the thoracic spine in 3-6 months may be helpful. 2. No acute osseous injury of the thoracic spine. 3. Thoracic spine spondylosis as described above. 4. Bilateral pulmonary nodules consistent with metastatic disease.  Electronically Signed   By: Kathreen Devoid   On: 06/13/2018 16:17     ASSESSMENT & PLAN:  Ryan Collins is a 58 y.o. male with   1. Gastric cancer, T4bNxM1 with presumedperitoneal metastasis, adenocarcinoma, HER2(-), MSI-stable, PD-L1positive (5%), ypT3N3aM0, lung metastasis in 05/2018  -He was diagnosed in 05/2017. Initial staging scan showed a large mass in LUQ, presumably peritoneal metastasis.  He underwent several months of FOLFOX, followed by total gastrectomy with esophagojejunostomy en bloc with distal pancreatectomy/splenectomy with J-tube in place in 02/2018 -Due to his prolonged neoadjuvant chemotherapy with FOLOX, I do not think he would benefit more from adjuvant chemo.Due to the D2 resection, adjuvant radiation was also not recommended  -I reviewed his CT CAP from 06/05/18 with patient in great detail. He has new numerous small pulmonary nodules consistent with metastasis. He also has lumbar sine compression fracture and uptake in stomach LN. I discussed compression fractures are often caused by cancer in the bone.  -Given the risk of lung biopsy, He will  proceed with MRI of spine today to rule out spinal metastasis.  -if MRI negative, will start him on second line chemo weekly taxol and Cyramza next week --Chemotherapy consent: Side effects including but does not not limited to, fatigue, nausea, vomiting, diarrhea, hair loss, neuropathy, fluid retention, renal and kidney dysfunction, neutropenic fever, needed for blood transfusion, bleeding, risk of hemorrhage, thrombosis, bowel perforation, were discussed with patient in great detail. He agrees to proceed. -The goal of therapy is palliative, to prolong his life. -f/u in 2 weeks   2. Anemia of iron deficiency and tumor bleeding, B12 deficiency -He has clinical GI bleeding with melena, iron study also showed iron deficiency. -He has received blood transfusion, and 3 dose of IV Feraheme, responded well.  -We continue to monitor his  CBC and iron studies closely -HG has been stable lately -He will continue Oral-B complex   3. History of DVT, ? Protein C deficiency -He was on coumadin for his DVT before, and previous labs showed a protein C deficiency, which increases his risk of thrombosis. -We previously discussed the high risk of thrombosis secondary to his underlying malignancy. -Due to his chronic GI bleeding from his tumor, I would not do prophylactic anticoagulation.  4. History of alcohol and marijuana abuse -He has quit drinking alcohol since his diagnosis of cancer -He still does smoke marijuana occasionally  5. Weight loss and malnutrition  -He has lost significant weight since the diagnosis and surgery  -Will continue to f/u with our dietitian -His J-tube fell out incidentally in December 2019  6. Peripheral neuropathy, grade 1, second to chemotherapy oxaliplatin -He has neuropathy on his fingers and toes, overall mild and stable.   7. Abdominal pain  -Secondary to surgery  -He completed his prescription of oxycodone given by Dr. Barry Dienes.  -Due to his previous substance abuse, I will be cautious about narcotics.  He seems to be able to control his pain with Tylenol -abdominal pain is intermittent now, he takes flexeril will mild relief, and also taking Tylenol and Alkazaltzer.  8. Thoracic compression fracture  -he is going to complete thoracic spine MRI after his visit today   PLAN: -MRI Spine today  -possible start chemo next week     No problem-specific Assessment & Plan notes found for this encounter.   No orders of the defined types were placed in this encounter.  All questions were answered. The patient knows to call the clinic with any problems, questions or concerns. No barriers to learning was detected. I spent 20 minutes counseling the patient face to face. The total time spent in the appointment was 25 minutes and more than 50% was on counseling and review of test results      [name], MD 06/13/2018   I, Joslyn Devon, am acting as scribe for [name], MD.   I have reviewed the above documentation for accuracy and completeness, and I agree with the above.   Addendum I reviewed his thoracic spine MRI after his clinical visit, which showed no evidence of bone metastasis.  I will call him on Monday to discuss the results. Plan to start chemotherapy weekly Taxol on day 1, 8, 15 and Cyramza on day 1 and 15, every 28 days next week   [name]

## 2018-06-16 ENCOUNTER — Telehealth: Payer: Self-pay | Admitting: Hematology

## 2018-06-16 ENCOUNTER — Inpatient Hospital Stay: Payer: Medicaid Other

## 2018-06-16 ENCOUNTER — Inpatient Hospital Stay: Payer: Medicaid Other | Attending: Hematology

## 2018-06-16 VITALS — BP 125/88 | HR 100 | Temp 99.0°F | Resp 16

## 2018-06-16 DIAGNOSIS — Z9221 Personal history of antineoplastic chemotherapy: Secondary | ICD-10-CM | POA: Insufficient documentation

## 2018-06-16 DIAGNOSIS — K922 Gastrointestinal hemorrhage, unspecified: Secondary | ICD-10-CM | POA: Diagnosis not present

## 2018-06-16 DIAGNOSIS — Z5111 Encounter for antineoplastic chemotherapy: Secondary | ICD-10-CM | POA: Diagnosis present

## 2018-06-16 DIAGNOSIS — Z903 Acquired absence of stomach [part of]: Secondary | ICD-10-CM

## 2018-06-16 DIAGNOSIS — D500 Iron deficiency anemia secondary to blood loss (chronic): Secondary | ICD-10-CM | POA: Diagnosis not present

## 2018-06-16 DIAGNOSIS — Z934 Other artificial openings of gastrointestinal tract status: Secondary | ICD-10-CM

## 2018-06-16 DIAGNOSIS — F1011 Alcohol abuse, in remission: Secondary | ICD-10-CM | POA: Insufficient documentation

## 2018-06-16 DIAGNOSIS — Z86718 Personal history of other venous thrombosis and embolism: Secondary | ICD-10-CM | POA: Diagnosis not present

## 2018-06-16 DIAGNOSIS — G620 Drug-induced polyneuropathy: Secondary | ICD-10-CM | POA: Insufficient documentation

## 2018-06-16 DIAGNOSIS — T451X5A Adverse effect of antineoplastic and immunosuppressive drugs, initial encounter: Secondary | ICD-10-CM | POA: Diagnosis not present

## 2018-06-16 DIAGNOSIS — D6859 Other primary thrombophilia: Secondary | ICD-10-CM | POA: Insufficient documentation

## 2018-06-16 DIAGNOSIS — Z980 Intestinal bypass and anastomosis status: Secondary | ICD-10-CM

## 2018-06-16 DIAGNOSIS — C161 Malignant neoplasm of fundus of stomach: Secondary | ICD-10-CM | POA: Diagnosis present

## 2018-06-16 DIAGNOSIS — E538 Deficiency of other specified B group vitamins: Secondary | ICD-10-CM | POA: Insufficient documentation

## 2018-06-16 DIAGNOSIS — R6881 Early satiety: Secondary | ICD-10-CM | POA: Insufficient documentation

## 2018-06-16 DIAGNOSIS — C7800 Secondary malignant neoplasm of unspecified lung: Secondary | ICD-10-CM | POA: Diagnosis not present

## 2018-06-16 DIAGNOSIS — Z95828 Presence of other vascular implants and grafts: Secondary | ICD-10-CM

## 2018-06-16 DIAGNOSIS — E46 Unspecified protein-calorie malnutrition: Secondary | ICD-10-CM | POA: Diagnosis not present

## 2018-06-16 DIAGNOSIS — M4854XA Collapsed vertebra, not elsewhere classified, thoracic region, initial encounter for fracture: Secondary | ICD-10-CM | POA: Insufficient documentation

## 2018-06-16 DIAGNOSIS — R066 Hiccough: Secondary | ICD-10-CM | POA: Insufficient documentation

## 2018-06-16 DIAGNOSIS — G8918 Other acute postprocedural pain: Secondary | ICD-10-CM | POA: Insufficient documentation

## 2018-06-16 DIAGNOSIS — F1210 Cannabis abuse, uncomplicated: Secondary | ICD-10-CM | POA: Diagnosis not present

## 2018-06-16 LAB — CMP (CANCER CENTER ONLY)
ALT: 32 U/L (ref 0–44)
AST: 27 U/L (ref 15–41)
Albumin: 3.6 g/dL (ref 3.5–5.0)
Alkaline Phosphatase: 221 U/L — ABNORMAL HIGH (ref 38–126)
Anion gap: 7 (ref 5–15)
BUN: <4 mg/dL — ABNORMAL LOW (ref 6–20)
CO2: 29 mmol/L (ref 22–32)
Calcium: 8.8 mg/dL — ABNORMAL LOW (ref 8.9–10.3)
Chloride: 106 mmol/L (ref 98–111)
Creatinine: 0.79 mg/dL (ref 0.61–1.24)
GFR, Est AFR Am: >60 mL/min (ref >60)
GFR, Estimated: >60 mL/min (ref >60)
Glucose, Bld: 191 mg/dL — ABNORMAL HIGH (ref 70–99)
Potassium: 3.4 mmol/L — ABNORMAL LOW (ref 3.5–5.1)
Sodium: 142 mmol/L (ref 135–145)
Total Bilirubin: 0.6 mg/dL (ref 0.3–1.2)
Total Protein: 6.6 g/dL (ref 6.5–8.1)

## 2018-06-16 LAB — CBC WITH DIFFERENTIAL (CANCER CENTER ONLY)
Abs Immature Granulocytes: 0.05 10*3/uL (ref 0.00–0.07)
BASOS ABS: 0 10*3/uL (ref 0.0–0.1)
Basophils Relative: 0 %
Eosinophils Absolute: 0 10*3/uL (ref 0.0–0.5)
Eosinophils Relative: 0 %
HCT: 34.4 % — ABNORMAL LOW (ref 39.0–52.0)
Hemoglobin: 11.6 g/dL — ABNORMAL LOW (ref 13.0–17.0)
Immature Granulocytes: 1 %
LYMPHS ABS: 1.6 10*3/uL (ref 0.7–4.0)
Lymphocytes Relative: 17 %
MCH: 28.6 pg (ref 26.0–34.0)
MCHC: 33.7 g/dL (ref 30.0–36.0)
MCV: 84.9 fL (ref 80.0–100.0)
Monocytes Absolute: 0.9 10*3/uL (ref 0.1–1.0)
Monocytes Relative: 9 %
NRBC: 0 % (ref 0.0–0.2)
Neutro Abs: 6.9 10*3/uL (ref 1.7–7.7)
Neutrophils Relative %: 73 %
Platelet Count: 552 10*3/uL — ABNORMAL HIGH (ref 150–400)
RBC: 4.05 MIL/uL — ABNORMAL LOW (ref 4.22–5.81)
RDW: 19.4 % — ABNORMAL HIGH (ref 11.5–15.5)
WBC Count: 9.4 10*3/uL (ref 4.0–10.5)

## 2018-06-16 LAB — TOTAL PROTEIN, URINE DIPSTICK: Protein, ur: NEGATIVE mg/dL

## 2018-06-16 MED ORDER — SODIUM CHLORIDE 0.9 % IV SOLN
8.0000 mg/kg | Freq: Once | INTRAVENOUS | Status: AC
  Administered 2018-06-16: 600 mg via INTRAVENOUS
  Filled 2018-06-16: qty 10

## 2018-06-16 MED ORDER — ACETAMINOPHEN 325 MG PO TABS
ORAL_TABLET | ORAL | Status: AC
  Filled 2018-06-16: qty 2

## 2018-06-16 MED ORDER — SODIUM CHLORIDE 0.9 % IV SOLN
Freq: Once | INTRAVENOUS | Status: AC
  Administered 2018-06-16: 16:00:00 via INTRAVENOUS
  Filled 2018-06-16: qty 250

## 2018-06-16 MED ORDER — DIPHENHYDRAMINE HCL 50 MG/ML IJ SOLN
50.0000 mg | Freq: Once | INTRAMUSCULAR | Status: AC
  Administered 2018-06-16: 50 mg via INTRAVENOUS

## 2018-06-16 MED ORDER — DIPHENHYDRAMINE HCL 50 MG/ML IJ SOLN
INTRAMUSCULAR | Status: AC
  Filled 2018-06-16: qty 1

## 2018-06-16 MED ORDER — ACETAMINOPHEN 325 MG PO TABS
650.0000 mg | ORAL_TABLET | Freq: Once | ORAL | Status: AC
  Administered 2018-06-16: 650 mg via ORAL

## 2018-06-16 MED ORDER — HEPARIN SOD (PORK) LOCK FLUSH 100 UNIT/ML IV SOLN
500.0000 [IU] | Freq: Once | INTRAVENOUS | Status: AC
  Administered 2018-06-16: 500 [IU]
  Filled 2018-06-16: qty 5

## 2018-06-16 MED ORDER — SODIUM CHLORIDE 0.9% FLUSH
10.0000 mL | Freq: Once | INTRAVENOUS | Status: AC
  Administered 2018-06-16: 10 mL
  Filled 2018-06-16: qty 10

## 2018-06-16 MED ORDER — SODIUM CHLORIDE 0.9% FLUSH
10.0000 mL | INTRAVENOUS | Status: DC | PRN
  Administered 2018-06-16: 10 mL
  Filled 2018-06-16: qty 10

## 2018-06-16 MED ORDER — HEPARIN SOD (PORK) LOCK FLUSH 100 UNIT/ML IV SOLN
500.0000 [IU] | Freq: Once | INTRAVENOUS | Status: AC | PRN
  Administered 2018-06-16: 500 [IU]
  Filled 2018-06-16: qty 5

## 2018-06-16 NOTE — Telephone Encounter (Signed)
No los per 01/31.

## 2018-06-16 NOTE — Addendum Note (Signed)
Addended by: Carlene Coria L on: 06/16/2018 02:50 PM   Modules accepted: Orders

## 2018-06-16 NOTE — Patient Instructions (Signed)
[facility] Discharge Instructions for Patients Receiving Chemotherapy  Today you received the following chemotherapy agents cyramza  To help prevent nausea and vomiting after your treatment, we encourage you to take your nausea medication as directed  If you develop nausea and vomiting that is not controlled by your nausea medication, call the clinic.   BELOW ARE SYMPTOMS THAT SHOULD BE REPORTED IMMEDIATELY:  *FEVER GREATER THAN 100.5 F  *CHILLS WITH OR WITHOUT FEVER  NAUSEA AND VOMITING THAT IS NOT CONTROLLED WITH YOUR NAUSEA MEDICATION  *UNUSUAL SHORTNESS OF BREATH  *UNUSUAL BRUISING OR BLEEDING  TENDERNESS IN MOUTH AND THROAT WITH OR WITHOUT PRESENCE OF ULCERS  *URINARY PROBLEMS  *BOWEL PROBLEMS  UNUSUAL RASH Items with * indicate a potential emergency and should be followed up as soon as possible.  Feel free to call the clinic should you have any questions or concerns. The clinic phone number is [phone].  Please show the [facility] at check-in to the Emergency Department and triage nurse.  Ramucirumab injection What is this medicine? RAMUCIRUMAB (ra mue SIR ue mab) is a monoclonal antibody. It is used to treat stomach cancer, colorectal cancer, liver cancer, and lung cancer. This medicine may be used for other purposes; ask your health care provider or pharmacist if you have questions. COMMON BRAND NAME(S): Cyramza What should I tell my health care provider before I take this medicine? They need to know if you have any of these conditions: -bleeding disorders -blood clots -heart disease, including heart failure, heart attack, or chest pain (angina) -high blood pressure -infection (especially a virus infection such as chickenpox, cold sores, or herpes) -protein in your urine -recent surgery -stroke -an unusual or allergic reaction to ramucirumab, other medicines, foods, dyes, or preservatives -pregnant or trying to get  pregnant -breast-feeding How should I use this medicine? This medicine is for infusion into a vein. It is given by a health care professional in a hospital or clinic setting. Talk to your pediatrician regarding the use of this medicine in children. Special care may be needed. Overdosage: If you think you have taken too much of this medicine contact a poison control center or emergency room at once. NOTE: This medicine is only for you. Do not share this medicine with others. What if I miss a dose? It is important not to miss your dose. Call your doctor or health care professional if you are unable to keep an appointment. What may interact with this medicine? Interactions have not been studied. This list may not describe all possible interactions. Give your health care provider a list of all the medicines, herbs, non-prescription drugs, or dietary supplements you use. Also tell them if you smoke, drink alcohol, or use illegal drugs. Some items may interact with your medicine. What should I watch for while using this medicine? Your condition will be monitored carefully while you are receiving this medicine. You will need to to check your blood pressure and have your blood and urine tested while you are taking this medicine. Your condition will be monitored carefully while you are receiving this medicine. This medicine may increase your risk to bruise or bleed. Call your doctor or health care professional if you notice any unusual bleeding. This medicine may rarely cause 'gastrointestinal perforation' (holes in the stomach, intestines or colon), a serious side effect requiring surgery to repair. This medicine should be started at least 28 days following major surgery and the site of the surgery should be totally healed.  Check with your doctor before scheduling dental work or surgery while you are receiving this treatment. Talk to your doctor if you have recently had surgery or if you have a wound that  has not healed. Do not become pregnant while taking this medicine or for 3 months after stopping it. Women should inform their doctor if they wish to become pregnant or think they might be pregnant. There is a potential for serious side effects to an unborn child. Talk to your health care professional or pharmacist for more information. Do not breast-feed an infant while taking this medicine or for 2 months after stopping it. This medicine may interfere with the ability to have a child. Talk with your doctor or health care professional if you are concerned about your fertility. What side effects may I notice from receiving this medicine? Side effects that you should report to your doctor or health care professional as soon as possible: -allergic reactions like skin rash, itching or hives, breathing problems, swelling of the face, lips, or tongue -signs of infection - fever or chills, cough, sore throat -chest pain or chest tightness -confusion -dizziness -feeling faint or lightheaded, falls -severe abdominal pain -severe nausea, vomiting -signs and symptoms of bleeding such as bloody or black, tarry stools; red or dark-brown urine; spitting up blood or brown material that looks like coffee grounds; red spots on the skin; unusual bruising or bleeding from the eye, gums, or nose -signs and symptoms of a blood clot such as breathing problems; changes in vision; chest pain; severe, sudden headache; pain, swelling, warmth in the leg; trouble speaking; sudden numbness or weakness of the face, arm or leg -symptoms of a stroke: change in mental awareness, inability to talk or move one side of the body -trouble walking, dizziness, loss of balance or coordination Side effects that usually do not require medical attention (report to your doctor or health care professional if they continue or are bothersome): -cold, clammy skin -constipation -diarrhea -headache -nausea, vomiting -stomach pain -unusually  slow heartbeat -unusually weak or tired This list may not describe all possible side effects. Call your doctor for medical advice about side effects. You may report side effects to FDA at 1-800-FDA-1088. Where should I keep my medicine? This drug is given in a hospital or clinic and will not be stored at home. NOTE: This sheet is a summary. It may not cover all possible information. If you have questions about this medicine, talk to your doctor, pharmacist, or health care provider.  2019 [employer] Standard (2017-10-09 11:25:23)

## 2018-06-16 NOTE — Progress Notes (Signed)
Ok to give cyramza today and taxol tomorrow due to patient being late per MD Burr Medico

## 2018-06-17 ENCOUNTER — Inpatient Hospital Stay: Payer: Medicaid Other

## 2018-06-17 ENCOUNTER — Other Ambulatory Visit: Payer: Self-pay | Admitting: Hematology

## 2018-06-17 VITALS — BP 128/90 | HR 71 | Temp 98.2°F | Resp 16

## 2018-06-17 DIAGNOSIS — C161 Malignant neoplasm of fundus of stomach: Secondary | ICD-10-CM

## 2018-06-17 DIAGNOSIS — Z5111 Encounter for antineoplastic chemotherapy: Secondary | ICD-10-CM | POA: Diagnosis not present

## 2018-06-17 MED ORDER — DEXAMETHASONE SODIUM PHOSPHATE 10 MG/ML IJ SOLN
INTRAMUSCULAR | Status: AC
  Filled 2018-06-17: qty 1

## 2018-06-17 MED ORDER — DEXAMETHASONE SODIUM PHOSPHATE 10 MG/ML IJ SOLN
10.0000 mg | Freq: Once | INTRAMUSCULAR | Status: AC
  Administered 2018-06-17: 10 mg via INTRAVENOUS

## 2018-06-17 MED ORDER — SODIUM CHLORIDE 0.9% FLUSH
10.0000 mL | INTRAVENOUS | Status: DC | PRN
  Administered 2018-06-17: 10 mL
  Filled 2018-06-17: qty 10

## 2018-06-17 MED ORDER — HEPARIN SOD (PORK) LOCK FLUSH 100 UNIT/ML IV SOLN
500.0000 [IU] | Freq: Once | INTRAVENOUS | Status: AC | PRN
  Administered 2018-06-17: 500 [IU]
  Filled 2018-06-17: qty 5

## 2018-06-17 MED ORDER — SODIUM CHLORIDE 0.9 % IV SOLN
80.0000 mg/m2 | Freq: Once | INTRAVENOUS | Status: AC
  Administered 2018-06-17: 150 mg via INTRAVENOUS
  Filled 2018-06-17: qty 25

## 2018-06-17 MED ORDER — FAMOTIDINE IN NACL 20-0.9 MG/50ML-% IV SOLN
INTRAVENOUS | Status: AC
  Filled 2018-06-17: qty 50

## 2018-06-17 MED ORDER — DIPHENHYDRAMINE HCL 50 MG/ML IJ SOLN
50.0000 mg | Freq: Once | INTRAMUSCULAR | Status: AC
  Administered 2018-06-17: 50 mg via INTRAVENOUS

## 2018-06-17 MED ORDER — SODIUM CHLORIDE 0.9 % IV SOLN
Freq: Once | INTRAVENOUS | Status: AC
  Administered 2018-06-17: 09:00:00 via INTRAVENOUS
  Filled 2018-06-17: qty 250

## 2018-06-17 MED ORDER — DIPHENHYDRAMINE HCL 50 MG/ML IJ SOLN
INTRAMUSCULAR | Status: AC
  Filled 2018-06-17: qty 1

## 2018-06-17 MED ORDER — FAMOTIDINE IN NACL 20-0.9 MG/50ML-% IV SOLN
20.0000 mg | Freq: Once | INTRAVENOUS | Status: AC
  Administered 2018-06-17: 20 mg via INTRAVENOUS

## 2018-06-17 NOTE — Progress Notes (Signed)
These preliminary result these preliminary results were noted.  Awaiting final report.

## 2018-06-17 NOTE — Patient Instructions (Signed)
[facility] Discharge Instructions for Patients Receiving Chemotherapy  Today you received the following chemotherapy agents taxol   To help prevent nausea and vomiting after your treatment, we encourage you to take your nausea medication as directed.   If you develop nausea and vomiting that is not controlled by your nausea medication, call the clinic.   BELOW ARE SYMPTOMS THAT SHOULD BE REPORTED IMMEDIATELY:  *FEVER GREATER THAN 100.5 F  *CHILLS WITH OR WITHOUT FEVER  NAUSEA AND VOMITING THAT IS NOT CONTROLLED WITH YOUR NAUSEA MEDICATION  *UNUSUAL SHORTNESS OF BREATH  *UNUSUAL BRUISING OR BLEEDING  TENDERNESS IN MOUTH AND THROAT WITH OR WITHOUT PRESENCE OF ULCERS  *URINARY PROBLEMS  *BOWEL PROBLEMS  UNUSUAL RASH Items with * indicate a potential emergency and should be followed up as soon as possible.  Feel free to call the clinic you have any questions or concerns. The clinic phone number is [phone].  Paclitaxel injection What is this medicine? PACLITAXEL (PAK li TAX el) is a chemotherapy drug. It targets fast dividing cells, like cancer cells, and causes these cells to die. This medicine is used to treat ovarian cancer, breast cancer, lung cancer, Kaposi's sarcoma, and other cancers. This medicine may be used for other purposes; ask your health care provider or pharmacist if you have questions. COMMON BRAND NAME(S): Onxol, Taxol What should I tell my health care provider before I take this medicine? They need to know if you have any of these conditions: -history of irregular heartbeat -liver disease -low blood counts, like low white cell, platelet, or red cell counts -lung or breathing disease, like asthma -tingling of the fingers or toes, or other nerve disorder -an unusual or allergic reaction to paclitaxel, alcohol, polyoxyethylated castor oil, other chemotherapy, other medicines, foods, dyes, or preservatives -pregnant or trying to get  pregnant -breast-feeding How should I use this medicine? This drug is given as an infusion into a vein. It is administered in a hospital or clinic by a specially trained health care professional. Talk to your pediatrician regarding the use of this medicine in children. Special care may be needed. Overdosage: If you think you have taken too much of this medicine contact a poison control center or emergency room at once. NOTE: This medicine is only for you. Do not share this medicine with others. What if I miss a dose? It is important not to miss your dose. Call your doctor or health care professional if you are unable to keep an appointment. What may interact with this medicine? Do not take this medicine with any of the following medications: -disulfiram -metronidazole This medicine may also interact with the following medications: -antiviral medicines for hepatitis, HIV or AIDS -certain antibiotics like erythromycin and clarithromycin -certain medicines for fungal infections like ketoconazole and itraconazole -certain medicines for seizures like carbamazepine, phenobarbital, phenytoin -gemfibrozil -nefazodone -rifampin -St. John's wort This list may not describe all possible interactions. Give your health care provider a list of all the medicines, herbs, non-prescription drugs, or dietary supplements you use. Also tell them if you smoke, drink alcohol, or use illegal drugs. Some items may interact with your medicine. What should I watch for while using this medicine? Your condition will be monitored carefully while you are receiving this medicine. You will need important blood work done while you are taking this medicine. This medicine can cause serious allergic reactions. To reduce your risk you will need to take other medicine(s) before treatment with this medicine. If you experience allergic  reactions like skin rash, itching or hives, swelling of the face, lips, or tongue, tell your  doctor or health care professional right away. In some cases, you may be given additional medicines to help with side effects. Follow all directions for their use. This drug may make you feel generally unwell. This is not uncommon, as chemotherapy can affect healthy cells as well as cancer cells. Report any side effects. Continue your course of treatment even though you feel ill unless your doctor tells you to stop. Call your doctor or health care professional for advice if you get a fever, chills or sore throat, or other symptoms of a cold or flu. Do not treat yourself. This drug decreases your body's ability to fight infections. Try to avoid being around people who are sick. This medicine may increase your risk to bruise or bleed. Call your doctor or health care professional if you notice any unusual bleeding. Be careful brushing and flossing your teeth or using a toothpick because you may get an infection or bleed more easily. If you have any dental work done, tell your dentist you are receiving this medicine. Avoid taking products that contain aspirin, acetaminophen, ibuprofen, naproxen, or ketoprofen unless instructed by your doctor. These medicines may hide a fever. Do not become pregnant while taking this medicine. Women should inform their doctor if they wish to become pregnant or think they might be pregnant. There is a potential for serious side effects to an unborn child. Talk to your health care professional or pharmacist for more information. Do not breast-feed an infant while taking this medicine. Men are advised not to father a child while receiving this medicine. This product may contain alcohol. Ask your pharmacist or healthcare provider if this medicine contains alcohol. Be sure to tell all healthcare providers you are taking this medicine. Certain medicines, like metronidazole and disulfiram, can cause an unpleasant reaction when taken with alcohol. The reaction includes flushing,  headache, nausea, vomiting, sweating, and increased thirst. The reaction can last from 30 minutes to several hours. What side effects may I notice from receiving this medicine? Side effects that you should report to your doctor or health care professional as soon as possible: -allergic reactions like skin rash, itching or hives, swelling of the face, lips, or tongue -breathing problems -changes in vision -fast, irregular heartbeat -high or low blood pressure -mouth sores -pain, tingling, numbness in the hands or feet -signs of decreased platelets or bleeding - bruising, pinpoint red spots on the skin, black, tarry stools, blood in the urine -signs of decreased red blood cells - unusually weak or tired, feeling faint or lightheaded, falls -signs of infection - fever or chills, cough, sore throat, pain or difficulty passing urine -signs and symptoms of liver injury like dark yellow or brown urine; general ill feeling or flu-like symptoms; light-colored stools; loss of appetite; nausea; right upper belly pain; unusually weak or tired; yellowing of the eyes or skin -swelling of the ankles, feet, hands -unusually slow heartbeat Side effects that usually do not require medical attention (report to your doctor or health care professional if they continue or are bothersome): -diarrhea -hair loss -loss of appetite -muscle or joint pain -nausea, vomiting -pain, redness, or irritation at site where injected -tiredness This list may not describe all possible side effects. Call your doctor for medical advice about side effects. You may report side effects to FDA at 1-800-FDA-1088. Where should I keep my medicine? This drug is given in a hospital or  clinic and will not be stored at home. NOTE: This sheet is a summary. It may not cover all possible information. If you have questions about this medicine, talk to your doctor, pharmacist, or health care provider.  2019 [employer] Standard (2017-01-01  13:14:55)

## 2018-06-19 NOTE — Progress Notes (Signed)
[place]   [phone] Fax:([phone]) [phone]   Clinic Follow up Note   Patient Care Team: Seward Carol, MD as PCP - General (Internal Medicine) 06/24/2018  CHIEF COMPLAINT: F/u of gastric cancer  SUMMARY OF ONCOLOGIC HISTORY: Oncology History   Cancer Staging Gastric cancer [facility]) Staging form: Stomach, AJCC 8th Edition - Clinical stage from 06/04/2017: Stage IVB (cT4, cN0, cM1) - Signed by [name], MD on 06/10/2017 - Pathologic stage from 02/25/2018: Stage III (ypT3, pN3a, cM0) - Signed by [name], MD on 04/01/2018       Gastric cancer ([place])   06/03/2017 - 06/06/2017 Hospital Admission    Admitted to the hospital on 06/03/17 with complaints of fatigue and melena. During his stay he underwent an endoscopy that revealed a malignant gastric tumor in the cardia and in the gastric fundus. Biopsy results revealed adenocarcinoma. Pt was discharged on 06/06/17.    06/04/2017 Initial Biopsy    Diagnosis 06/04/17 Stomach, biopsy, Fundus - ADENOCARCINOMA - SEE COMMENT Microscopic Comment The stomach biopsy is of an adenocarcinoma arising in a background of intestinal metaplasia. A Warthin-Starry stain is performed to determine the possibility of the presence of Helicobacter pylori. The Warthin-Starry stain is negative for organisms morphologically consistent with Helicobacter pylori.    06/04/2017 Procedure    Esophagogastroduodenoscopy 06/04/17 by Dr. Benson Norway Impression: Normal esophagus. Malignant gastric tumor in the cardia and in the  gastric fundus. Biopsied. Normal examined duodenum.    06/04/2017 Miscellaneous    HER2 (-) EBV (-) MSI-S PD-L1 CPS 5%    06/05/2017 Imaging    CT CAP W Contrast 06/05/17 IMPRESSION: 9.1 cm fungating mass in the gastric cardia/posterior gastric fundus, corresponding to the patient's newly diagnosed gastric Cancer. Associated extra gastric extension with peritoneal disease in the left upper abdomen, including a dominant 4.2  cm peritoneal implant. Small volume pelvic ascites. 1.8 cm hypoenhancing lesion along the inferior spleen, indeterminate.    06/10/2017 Initial Diagnosis    Gastric cancer ([place])    06/26/2017 - 01/22/2018 Chemotherapy    First line chemo FOLFOX every 2 weeks     08/29/2017 Imaging    IMPRESSION: 1. Interval decrease in size of fungating ulcerative mass arising from the posterior gastric fundus. 2. Left peritoneal implant, compatible with peritoneal carcinomatosis, has decreased in size from the previous exam. 3. Right lobe of liver lesion present on previous exam is less conspicuous on today's study. A new lesion is identified within the left lobe of liver, suspicious for metastasis. Additional focus of low attenuation within segment 5 adjacent to the gallbladder fossa may represent focal fatty deposition. 4. Similar appearance of mildly complex cyst containing mural calcification and possible internal septation arising from the inferior pole of right kidney. More definitive characterization of this lesion with renal protocol MRI may be helpful. 5.  Mild prostate gland enlargement. 6.  Aortic Atherosclerosis (ICD10-I70.0).    11/25/2017 Imaging    11/25/2017 CT AP W Contrast IMPRESSION: 1. Mixed appearance, with the gastric fundal mass thicker than it was on 08/29/2017 (although still improved compared to 06/05/2017), but with reduced local conglomerate adenopathy along the splenic hilum compared to 08/29/2017. 2. At this time I do not see any definite hepatic metastatic lesions. Several lesions of suspicion in the left hepatic lobe and inferiorly in the right hepatic lobe are not readily visible today. There is a small focus of hypodensity adjacent to the gallbladder fossa in segment 7 which is technically nonspecific but which could reflect focal fatty  infiltration. 3. Increase in nodular contour of scarring in the left lower lobe. This is probably incidental but I cannot completely  exclude the possibility of a localized metastatic lesion. Surveillance of the left lower lobe suggested. 4. Bosniak category 69F cyst of the right kidney lower pole with internal septation and calcification. Surveillance of this lesion is suggested; I suspect that the patient's cancer surveillance will supersede the typical recommended surveillance schedule for complex cyst. 5. Other imaging findings of potential clinical significance: Aortic Atherosclerosis (ICD10-I70.0). Degenerative glenohumeral arthropathy bilaterally. Mild impingement at L4-5 and L5-S1.    12/23/2017 Imaging    12/23/2017 MRI Abdomen IMPRESSION: Ill-defined soft tissue mass in the gastric fundus shows no significant change, consistent known gastric adenocarcinoma.  No definite liver metastases identified. Hemosiderosis noted, with focal area of sparing seen in the right hepatic lobe which accounts for the lesions seen on recent CT.  Stable 1.9 cm probably benign Bosniak category 2 F cystic lesion in right kidney. Recommend continued attention on follow-up imaging.    02/12/2018 Imaging    02/12/2018 CT CAP IMPRESSION: 1. Roughly similar appearance of the mass involving the gastric cardia/fundus. 2. Hepatic steatosis with some areas of fatty sparing as shown on MRI. No definite focal metastatic lesion to the liver is identified. 3. Nonspecific 0.8 cm lymph node between the tail the pancreas in the splenic hilum. There is also a mildly enlarged but stable right hilar lymph node at 1.0 cm in diameter, significance uncertain. 4. Overall stable appearance of the 1.9 cm Bosniak category 69F cyst of the right kidney lower pole. We have now documented 9 months of relative stability. Surveillance at or before 6 months time is recommended. 5. Other imaging findings of potential clinical significance: Aortic Atherosclerosis (ICD10-I70.0). Small anterior pericardial effusion. Stable scarring in the left lower lobe.  Medial hypoenhancement of the upper spleen is probably from early phase of contrast, less likely to be splenic infarct given the relatively normal appearance on delayed images. Impingement at L4-5 and L5-S1.    02/25/2018 Surgery    On 02/25/2018, he had a diagnostic laparoscopy, liver biopsy, total gastrectomy with esophagojejunostomy en bloc with distal pancreatectomy/splenectomy, feeding jejunostomy tube with Dr. Barry Dienes.     02/25/2018 Pathology Results    02/25/2018 Surgical Pathology Diagnosis 1. Soft tissue, biopsy, Diaphragmatic Nodule - NEGATIVE FOR CARCINOMA. 2. Liver, biopsy - LIVER PARENCHYMA, NEGATIVE FOR CARCINOMA. 3. Liver, biopsy, Right - LIVER PARENCHYMA WITH CAPSULAR FIBROSIS. - NEGATIVE FOR CARCINOMA. 4. Soft tissue, biopsy, Falciform Nodule - LIVER PARENCHYMA WITH FIBROTIC BANDS. SEE NOTE. - NEGATIVE FOR CARCINOMA. 5. Omentum, resection for tumor - BENIGN PERITONEUM-LINED ADIPOSE TISSUE. - NEGATIVE FOR CARCINOMA. 6. Stomach, resection for tumor, distal pancreas and spleen - INVASIVE MODERATELY TO POORLY DIFFERENTIATED GASTRIC CARCINOMA, 7 CM. - CARCINOMA INVADES INTO PERIGASTRIC SOFT TISSUE AND IS ADHERENT TO PANCREAS AND SPLEEN BUT DOES NOT INVADE INTO PANCREATIC OR SPLENIC PARENCHYMA. - NO EVIDENCE OF LYMPHOVASCULAR OR PERINEURAL INVASION. - MINIMAL THERAPEUTIC RESPONSE (TUMOR REGRESSION SCORE 3). - METASTATIC CARCINOMA TO EIGHT OF THIRTY-SEVEN LYMPH NODES (8/37). - ALL RESECTION MARGINS ARE NEGATIVE FOR CARCINOMA. - BENIGN UNREMARKABLE PANCREAS. - SEE ONCOLOGY TABLE.    02/25/2018 Cancer Staging    Staging form: Stomach, AJCC 8th Edition - Pathologic stage from 02/25/2018: Stage III (ypT3, pN3a, cM0) - Signed by [name], MD on 04/01/2018    06/05/2018 Imaging    CT CAP W COntrast 06/05/18  IMPRESSION: 1. Interval gastrectomy with esophagojejunostomy in the lower chest. Splenectomy and partial  pancreatectomy. 2. At least 35 new pulmonary nodules are  present, measuring up to 1.5 cm in diameter, high suspicion for metastatic disease. 3. Portacaval and porta hepatis adenopathy, suspicious for malignancy. 4. Although I do not see a definite liver tumor, sensitivity for small lesions is mildly adversely affected due to the late arterial phase of contrast administration. 5. New thoracic spine compression fractures at T6, T7, T2, T3, and T4 as detailed above. New lower sternal manubrial fracture. 6. Right inguinal hernia containing a loop of small bowel, no overt obstruction or strangulation currently evident. 7. Diffuse mild subcutaneous and mesenteric edema, cause uncertain. 8. Other imaging findings of potential clinical significance: Aortic Atherosclerosis (ICD10-I70.0). Lower lumbar spondylosis and degenerative disc disease.    06/16/2018 -  Chemotherapy    The patient had PACLitaxel (TAXOL) 150 mg in sodium chloride 0.9 % 250 mL chemo infusion (</= 39m/m2), 80 mg/m2 = 150 mg, Intravenous,  Once, 1 of 6 cycles Administration: 150 mg (06/24/2018), 150 mg (06/17/2018) ramucirumab (CYRAMZA) 600 mg in sodium chloride 0.9 % 190 mL chemo infusion, 8 mg/kg = 600 mg, Intravenous, Once, 1 of 6 cycles Administration: 600 mg (06/16/2018)  for chemotherapy treatment.      CURRENT THERAPY  On second line chemo weekly taxol and Cyramza starting 06/16/2018   INTERVAL HISTORY: EFremon Zachariais a 58y.o. male who is here for follow-up. Since last seen he started weekly chemotherapy taxol on 06/16/2018. Today, he is here alone. Chemotherapy went well. He had some nausea and he has low appetite due to early satiety . He only eats breakfast and drinks Ensures. He has a lower right abdominal hernia and hiccups. Denies pain, back pain rashes.    Pertinent positives and negatives of review of systems are listed and detailed within the above HPI.  REVIEW OF SYSTEMS:   Constitutional: Denies fevers, chills or abnormal weight loss, (+) low appetite due to early  satiety , (+) hiccups  Eyes: Denies blurriness of vision Ears, nose, mouth, throat, and face: Denies mucositis or sore throat Respiratory: Denies cough, dyspnea or wheezes Cardiovascular: Denies palpitation, chest discomfort or lower extremity swelling Gastrointestinal:  Denies nausea, heartburn or change in bowel habits Skin: Denies abnormal skin rashes Lymphatics: Denies new lymphadenopathy or easy bruising Neurological:Denies numbness, tingling or new weaknesses Behavioral/Psych: Mood is stable, no new changes  All other systems were reviewed with the patient and are negative.  MEDICAL HISTORY:  Past Medical History:  Diagnosis Date  . Cancer ([facility]    gastric cancer  . DVT (deep venous thrombosis) ([place] 2011  . GI bleeding 2001  . History of DVT of lower extremity   . Hypertension   . Melena 05/2017  . Stomach ulcer    Clinically suspected; No EGD confirmation as of 06/03/17    SURGICAL HISTORY: Past Surgical History:  Procedure Laterality Date  . ESOPHAGOGASTRODUODENOSCOPY  06/04/2017  . ESOPHAGOGASTRODUODENOSCOPY N/A 06/04/2017   Procedure: ESOPHAGOGASTRODUODENOSCOPY (EGD);  Surgeon: HCarol Ada MD;  [place]  Service: Endoscopy;  Laterality: N/A;  . GASTRECTOMY N/A 02/25/2018   Procedure: TOTAL GASTRECTOMY WITH J TUBE PLACEMENT;  Surgeon: BStark Klein MD;  [place]  Service: General;  Laterality: N/A;  . IR FLUORO GUIDE PORT INSERTION RIGHT  06/20/2017  . IR [place] VASC ACCESS RIGHT  06/20/2017  . JEJUNOSTOMY N/A 02/25/2018   Procedure: JEJUNOSTOMY TUBE;  Surgeon: BStark Klein MD;  [place]  Service: General;  Laterality: N/A;  . LAPAROSCOPY N/A 02/25/2018  Procedure: LAPAROSCOPY DIAGNOSTIC;  Surgeon: [name], MD;  Location: [place];  Service: General;  Laterality: N/A;  . SPLENECTOMY, TOTAL N/A 02/25/2018   Procedure: SPLENECTOMY;  Surgeon: [name], MD;  Location: [place];  Service: General;  Laterality: N/A;    I have  reviewed the social history and family history with the patient and they are unchanged from previous note.  ALLERGIES:  has No Known Allergies.  MEDICATIONS:  Current Outpatient Medications  Medication Sig Dispense Refill  . acetaminophen (TYLENOL) 500 MG tablet Take 1,000 mg by mouth every 6 (six) hours as needed for mild pain.     . Amino Acids-Protein Hydrolys (FEEDING SUPPLEMENT, PRO-STAT SUGAR FREE 64,) LIQD Place 30 mLs into feeding tube 3 (three) times daily. 900 mL 0  . amoxicillin-clavulanate (AUGMENTIN ES-600) 600-42.9 MG/5ML suspension Take 5 mLs (600 mg total) by mouth 2 (two) times daily. 75 mL 0  . cyclobenzaprine (FLEXERIL) 10 MG tablet Take 1 tablet (10 mg total) by mouth 3 (three) times daily as needed for muscle spasms. 30 tablet 1  . lidocaine-prilocaine (EMLA) cream Apply 1 application topically as needed (for port).    . magic mouthwash w/lidocaine SOLN Take 5 mLs by mouth 4 (four) times daily as needed for mouth pain. 240 mL 2  . ondansetron (ZOFRAN) 8 MG tablet Take 8 mg by mouth every 8 (eight) hours as needed.    . prochlorperazine (COMPAZINE) 10 MG tablet Take 10 mg by mouth every 6 (six) hours as needed.    . vitamin B-12 (CYANOCOBALAMIN) 1000 MCG tablet Take 1 tablet (1,000 mcg total) by mouth daily. 30 tablet 0   No current facility-administered medications for this visit.     PHYSICAL EXAMINATION: ECOG PERFORMANCE STATUS: 1 - Symptomatic but completely ambulatory  Vitals:   06/24/18 1342 06/24/18 1354  BP: (!) 134/99 (!) 135/98  Pulse: 100   Resp: 20   Temp: 99.1 F (37.3 C)   SpO2: 100%    Filed Weights   06/24/18 1342  Weight: 148 lb 14.4 oz (67.5 kg)    GENERAL:alert, no distress and comfortable SKIN: skin color, texture, turgor are normal, no rashes or significant lesions EYES: normal, Conjunctiva are pink and non-injected, sclera clear OROPHARYNX:no exudate, no erythema and lips, buccal mucosa, and tongue normal  NECK: supple, thyroid normal  size, non-tender, without nodularity LYMPH:  no palpable lymphadenopathy in the cervical, axillary or inguinal LUNGS: clear to auscultation and percussion with normal breathing effort HEART: regular rate & rhythm and no murmurs and no lower extremity edema ABDOMEN:abdomen soft, non-tender and normal bowel sounds Musculoskeletal:no cyanosis of digits and no clubbing  NEURO: alert & oriented x 3 with fluent speech, no focal motor/sensory deficits  LABORATORY DATA:  I have reviewed the data as listed CBC Latest Ref Rng & Units 06/24/2018 06/16/2018 06/05/2018  WBC 4.0 - 10.5 K/uL 7.0 9.4 12.7(H)  Hemoglobin 13.0 - 17.0 g/dL 11.6(L) 11.6(L) 11.5(L)  Hematocrit 39.0 - 52.0 % 34.4(L) 34.4(L) 34.1(L)  Platelets 150 - 400 K/uL 368 552(H) 371     CMP Latest Ref Rng & Units 06/24/2018 06/16/2018 06/05/2018  Glucose 70 - 99 mg/dL 88 191(H) 94  BUN 6 - 20 mg/dL 8 <4(L) 6  Creatinine 0.61 - 1.24 mg/dL 0.71 0.79 0.71  Sodium 135 - 145 mmol/L 137 142 140  Potassium 3.5 - 5.1 mmol/L 4.1 3.4(L) 3.6  Chloride 98 - 111 mmol/L 100 106 98  CO2 22 - 32 mmol/L 29 29 32  Calcium  8.9 - 10.3 mg/dL 9.1 8.8(L) 9.1  Total Protein 6.5 - 8.1 g/dL 7.0 6.6 6.9  Total Bilirubin 0.3 - 1.2 mg/dL 0.7 0.6 0.7  Alkaline Phos 38 - 126 U/L 155(H) 221(H) 92  AST 15 - 41 U/L 19 27 232(HH)  ALT 0 - 44 U/L 12 32 127(H)      RADIOGRAPHIC STUDIES: I have personally reviewed the radiological images as listed and agreed with the findings in the report. No results found.   ASSESSMENT & PLAN:  Ryan Collins is a 58 y.o. male with history of  1. Gastric cancer, T4bNxM1 with presumedperitoneal metastasis, adenocarcinoma, HER2(-), MSI-stable, PD-L1positive (5%), ypT3N3aM0, lung metastasis in 05/2018  -He was diagnosed in 05/2017. Initial staging scan showed a large mass in LUQ, presumably peritoneal metastasis.  He underwent several months of FOLFOX, followed by total gastrectomywith esophagojejunostomy en bloc with distal  pancreatectomy/splenectomywith J-tube in place in 02/2018 -Due to his prolonged neoadjuvant chemotherapywith FOLOX, I do not think he would benefit more from adjuvant chemo.Due to the D2 resection, adjuvant radiation wasalso not recommended -Unfortunately he developed lung metastasis in Jan 2020 -I have started him on second line chemo weekly taxol and Cyramza starting 06/16/2018  -The goal of therapy is palliative, to prolong his life. -He tolerated first week treatment well, no significant side effects, lab reviewed, adequate for treatment, will proceed with second week Taxol today -Follow-up in 2 weeks before cycle 2 Taxol and Cyramza   2. Anemia of iron deficiency and tumor bleeding, B12 deficiency -He has clinical GI bleeding with melena, iron study also showed iron deficiency. -He has received blood transfusion, and3dose of IV Feraheme, responded well. -takes Oral-B complex   3. History of DVT, ? Protein C deficiency -He was on coumadin for his DVT before, and previous labs showed a protein C deficiency, which increases his risk of thrombosis. -We previously discussed the high risk of thrombosis secondary to his underlying malignancy. -he is not on any phylactic anticoagulation.  4. History of alcohol and marijuana abuse -He has quit drinking alcohol since his diagnosis of cancer -He still does smoke marijuana occasionally  5. Weight loss and malnutrition  -He has lost significant weight since the diagnosis and surgery  -Will continue to f/uwith ourdietitian -His J-tube fell out incidentally in December 2019  6. Peripheral neuropathy, grade 1, second to chemotherapy oxaliplatin -He has neuropathy on his fingers and toes  7. Abdominal pain  -Secondary to surgery  -He completed his prescription of oxycodone given by Dr. Barry Dienes.  -Due to his previous substance abuse,I will be cautious about narcotics -takes flexeril, Tylenol and Alkazaltzer.  8. Thoracic  compression fracture  -I have discussed his thoracic spine MRI findings with pt, no evidence of bone mets  -continue calcium and Vitd -may consider Zometa infusion every 3 months    Plan  - Labs reviewed, and he will continue C1D8 Taxol today  -due to his hiccups, I will lower his premed steroid dose   -he will return next week for C1D15 Taxol and Cyramza -Lab, flush and chemo taxol in 3, 4 and 5 weeks, f/u and cyramza in 3 and 5 weeks  - F/u in 3 weeks  -will discuss zometa with pt on next visit   No problem-specific Assessment & Plan notes found for this encounter.   No orders of the defined types were placed in this encounter.  All questions were answered. The patient knows to call the clinic with any problems, questions or concerns. No  barriers to learning was detected. I spent 15 minutes counseling the patient face to face. The total time spent in the appointment was 20 minutes and more than 50% was on counseling and review of test results  I, Manson Allan am acting as scribe for Dr. [name].  I have reviewed the above documentation for accuracy and completeness, and I agree with the above.     [name], MD 06/24/2018

## 2018-06-24 ENCOUNTER — Encounter: Payer: Self-pay | Admitting: Hematology

## 2018-06-24 ENCOUNTER — Inpatient Hospital Stay (HOSPITAL_BASED_OUTPATIENT_CLINIC_OR_DEPARTMENT_OTHER): Payer: Medicaid Other | Admitting: Hematology

## 2018-06-24 ENCOUNTER — Inpatient Hospital Stay: Payer: Medicaid Other

## 2018-06-24 ENCOUNTER — Telehealth: Payer: Self-pay | Admitting: Hematology

## 2018-06-24 VITALS — BP 135/98 | HR 100 | Temp 99.1°F | Resp 20 | Ht 71.0 in | Wt 148.9 lb

## 2018-06-24 DIAGNOSIS — G620 Drug-induced polyneuropathy: Secondary | ICD-10-CM

## 2018-06-24 DIAGNOSIS — E46 Unspecified protein-calorie malnutrition: Secondary | ICD-10-CM

## 2018-06-24 DIAGNOSIS — D6859 Other primary thrombophilia: Secondary | ICD-10-CM

## 2018-06-24 DIAGNOSIS — E538 Deficiency of other specified B group vitamins: Secondary | ICD-10-CM

## 2018-06-24 DIAGNOSIS — Z9221 Personal history of antineoplastic chemotherapy: Secondary | ICD-10-CM | POA: Diagnosis not present

## 2018-06-24 DIAGNOSIS — C161 Malignant neoplasm of fundus of stomach: Secondary | ICD-10-CM

## 2018-06-24 DIAGNOSIS — R066 Hiccough: Secondary | ICD-10-CM

## 2018-06-24 DIAGNOSIS — Z980 Intestinal bypass and anastomosis status: Secondary | ICD-10-CM

## 2018-06-24 DIAGNOSIS — D500 Iron deficiency anemia secondary to blood loss (chronic): Secondary | ICD-10-CM

## 2018-06-24 DIAGNOSIS — R6881 Early satiety: Secondary | ICD-10-CM

## 2018-06-24 DIAGNOSIS — Z934 Other artificial openings of gastrointestinal tract status: Secondary | ICD-10-CM

## 2018-06-24 DIAGNOSIS — Z95828 Presence of other vascular implants and grafts: Secondary | ICD-10-CM

## 2018-06-24 DIAGNOSIS — C7800 Secondary malignant neoplasm of unspecified lung: Secondary | ICD-10-CM | POA: Diagnosis not present

## 2018-06-24 DIAGNOSIS — Z903 Acquired absence of stomach [part of]: Secondary | ICD-10-CM

## 2018-06-24 DIAGNOSIS — K922 Gastrointestinal hemorrhage, unspecified: Secondary | ICD-10-CM

## 2018-06-24 DIAGNOSIS — Z5111 Encounter for antineoplastic chemotherapy: Secondary | ICD-10-CM | POA: Diagnosis not present

## 2018-06-24 DIAGNOSIS — M4854XA Collapsed vertebra, not elsewhere classified, thoracic region, initial encounter for fracture: Secondary | ICD-10-CM

## 2018-06-24 DIAGNOSIS — G8918 Other acute postprocedural pain: Secondary | ICD-10-CM

## 2018-06-24 DIAGNOSIS — F1210 Cannabis abuse, uncomplicated: Secondary | ICD-10-CM

## 2018-06-24 DIAGNOSIS — F1011 Alcohol abuse, in remission: Secondary | ICD-10-CM

## 2018-06-24 DIAGNOSIS — Z86718 Personal history of other venous thrombosis and embolism: Secondary | ICD-10-CM

## 2018-06-24 LAB — CMP (CANCER CENTER ONLY)
ALT: 12 U/L (ref 0–44)
AST: 19 U/L (ref 15–41)
Albumin: 3.5 g/dL (ref 3.5–5.0)
Alkaline Phosphatase: 155 U/L — ABNORMAL HIGH (ref 38–126)
Anion gap: 8 (ref 5–15)
BUN: 8 mg/dL (ref 6–20)
CO2: 29 mmol/L (ref 22–32)
Calcium: 9.1 mg/dL (ref 8.9–10.3)
Chloride: 100 mmol/L (ref 98–111)
Creatinine: 0.71 mg/dL (ref 0.61–1.24)
GFR, Est AFR Am: >60 mL/min (ref >60)
GFR, Estimated: >60 mL/min (ref >60)
Glucose, Bld: 88 mg/dL (ref 70–99)
POTASSIUM: 4.1 mmol/L (ref 3.5–5.1)
Sodium: 137 mmol/L (ref 135–145)
Total Bilirubin: 0.7 mg/dL (ref 0.3–1.2)
Total Protein: 7 g/dL (ref 6.5–8.1)

## 2018-06-24 LAB — IRON AND TIBC
Iron: 27 ug/dL — ABNORMAL LOW (ref 42–163)
Saturation Ratios: 12 % — ABNORMAL LOW (ref 20–55)
TIBC: 230 ug/dL (ref 202–409)
UIBC: 203 ug/dL (ref 117–376)

## 2018-06-24 LAB — CBC WITH DIFFERENTIAL (CANCER CENTER ONLY)
Abs Immature Granulocytes: 0.03 10*3/uL (ref 0.00–0.07)
BASOS PCT: 0 %
Basophils Absolute: 0 10*3/uL (ref 0.0–0.1)
Eosinophils Absolute: 0 10*3/uL (ref 0.0–0.5)
Eosinophils Relative: 0 %
HCT: 34.4 % — ABNORMAL LOW (ref 39.0–52.0)
Hemoglobin: 11.6 g/dL — ABNORMAL LOW (ref 13.0–17.0)
Immature Granulocytes: 0 %
Lymphocytes Relative: 32 %
Lymphs Abs: 2.2 10*3/uL (ref 0.7–4.0)
MCH: 28 pg (ref 26.0–34.0)
MCHC: 33.7 g/dL (ref 30.0–36.0)
MCV: 83.1 fL (ref 80.0–100.0)
Monocytes Absolute: 0.5 10*3/uL (ref 0.1–1.0)
Monocytes Relative: 7 %
Neutro Abs: 4.2 10*3/uL (ref 1.7–7.7)
Neutrophils Relative %: 61 %
PLATELETS: 368 10*3/uL (ref 150–400)
RBC: 4.14 MIL/uL — ABNORMAL LOW (ref 4.22–5.81)
RDW: 18.6 % — AB (ref 11.5–15.5)
WBC Count: 7 10*3/uL (ref 4.0–10.5)
nRBC: 0 % (ref 0.0–0.2)

## 2018-06-24 LAB — FERRITIN: FERRITIN: 194 ng/mL (ref 24–336)

## 2018-06-24 LAB — CEA (IN HOUSE-CHCC): CEA (CHCC-In House): 44.5 ng/mL — ABNORMAL HIGH (ref 0.00–5.00)

## 2018-06-24 MED ORDER — DIPHENHYDRAMINE HCL 50 MG/ML IJ SOLN
INTRAMUSCULAR | Status: AC
  Filled 2018-06-24: qty 1

## 2018-06-24 MED ORDER — DIPHENHYDRAMINE HCL 50 MG/ML IJ SOLN
50.0000 mg | Freq: Once | INTRAMUSCULAR | Status: AC
  Administered 2018-06-24: 50 mg via INTRAVENOUS

## 2018-06-24 MED ORDER — HEPARIN SOD (PORK) LOCK FLUSH 100 UNIT/ML IV SOLN
500.0000 [IU] | Freq: Once | INTRAVENOUS | Status: AC | PRN
  Administered 2018-06-24: 500 [IU]
  Filled 2018-06-24: qty 5

## 2018-06-24 MED ORDER — SODIUM CHLORIDE 0.9 % IV SOLN
80.0000 mg/m2 | Freq: Once | INTRAVENOUS | Status: AC
  Administered 2018-06-24: 150 mg via INTRAVENOUS
  Filled 2018-06-24: qty 25

## 2018-06-24 MED ORDER — DEXAMETHASONE SODIUM PHOSPHATE 10 MG/ML IJ SOLN
5.0000 mg | Freq: Once | INTRAMUSCULAR | Status: AC
  Administered 2018-06-24: 5 mg via INTRAVENOUS

## 2018-06-24 MED ORDER — SODIUM CHLORIDE 0.9 % IV SOLN
Freq: Once | INTRAVENOUS | Status: AC
  Administered 2018-06-24: 15:00:00 via INTRAVENOUS
  Filled 2018-06-24: qty 250

## 2018-06-24 MED ORDER — FAMOTIDINE IN NACL 20-0.9 MG/50ML-% IV SOLN
INTRAVENOUS | Status: AC
  Filled 2018-06-24: qty 50

## 2018-06-24 MED ORDER — FAMOTIDINE IN NACL 20-0.9 MG/50ML-% IV SOLN
20.0000 mg | Freq: Once | INTRAVENOUS | Status: AC
  Administered 2018-06-24: 20 mg via INTRAVENOUS

## 2018-06-24 MED ORDER — SODIUM CHLORIDE 0.9% FLUSH
10.0000 mL | INTRAVENOUS | Status: DC | PRN
  Administered 2018-06-24: 10 mL
  Filled 2018-06-24: qty 10

## 2018-06-24 MED ORDER — SODIUM CHLORIDE 0.9% FLUSH
10.0000 mL | Freq: Once | INTRAVENOUS | Status: AC
  Administered 2018-06-24: 10 mL
  Filled 2018-06-24: qty 10

## 2018-06-24 MED ORDER — DEXAMETHASONE SODIUM PHOSPHATE 10 MG/ML IJ SOLN
INTRAMUSCULAR | Status: AC
  Filled 2018-06-24: qty 1

## 2018-06-24 NOTE — Patient Instructions (Signed)
[facility] Discharge Instructions for Patients Receiving Chemotherapy  Today you received the following chemotherapy agents taxol   To help prevent nausea and vomiting after your treatment, we encourage you to take your nausea medication as directed.   If you develop nausea and vomiting that is not controlled by your nausea medication, call the clinic.   BELOW ARE SYMPTOMS THAT SHOULD BE REPORTED IMMEDIATELY:  *FEVER GREATER THAN 100.5 F  *CHILLS WITH OR WITHOUT FEVER  NAUSEA AND VOMITING THAT IS NOT CONTROLLED WITH YOUR NAUSEA MEDICATION  *UNUSUAL SHORTNESS OF BREATH  *UNUSUAL BRUISING OR BLEEDING  TENDERNESS IN MOUTH AND THROAT WITH OR WITHOUT PRESENCE OF ULCERS  *URINARY PROBLEMS  *BOWEL PROBLEMS  UNUSUAL RASH Items with * indicate a potential emergency and should be followed up as soon as possible.  Feel free to call the clinic you have any questions or concerns. The clinic phone number is [phone].  Paclitaxel injection What is this medicine? PACLITAXEL (PAK li TAX el) is a chemotherapy drug. It targets fast dividing cells, like cancer cells, and causes these cells to die. This medicine is used to treat ovarian cancer, breast cancer, lung cancer, Kaposi's sarcoma, and other cancers. This medicine may be used for other purposes; ask your health care provider or pharmacist if you have questions. COMMON BRAND NAME(S): Onxol, Taxol What should I tell my health care provider before I take this medicine? They need to know if you have any of these conditions: -history of irregular heartbeat -liver disease -low blood counts, like low white cell, platelet, or red cell counts -lung or breathing disease, like asthma -tingling of the fingers or toes, or other nerve disorder -an unusual or allergic reaction to paclitaxel, alcohol, polyoxyethylated castor oil, other chemotherapy, other medicines, foods, dyes, or preservatives -pregnant or trying to get  pregnant -breast-feeding How should I use this medicine? This drug is given as an infusion into a vein. It is administered in a hospital or clinic by a specially trained health care professional. Talk to your pediatrician regarding the use of this medicine in children. Special care may be needed. Overdosage: If you think you have taken too much of this medicine contact a poison control center or emergency room at once. NOTE: This medicine is only for you. Do not share this medicine with others. What if I miss a dose? It is important not to miss your dose. Call your doctor or health care professional if you are unable to keep an appointment. What may interact with this medicine? Do not take this medicine with any of the following medications: -disulfiram -metronidazole This medicine may also interact with the following medications: -antiviral medicines for hepatitis, HIV or AIDS -certain antibiotics like erythromycin and clarithromycin -certain medicines for fungal infections like ketoconazole and itraconazole -certain medicines for seizures like carbamazepine, phenobarbital, phenytoin -gemfibrozil -nefazodone -rifampin -St. John's wort This list may not describe all possible interactions. Give your health care provider a list of all the medicines, herbs, non-prescription drugs, or dietary supplements you use. Also tell them if you smoke, drink alcohol, or use illegal drugs. Some items may interact with your medicine. What should I watch for while using this medicine? Your condition will be monitored carefully while you are receiving this medicine. You will need important blood work done while you are taking this medicine. This medicine can cause serious allergic reactions. To reduce your risk you will need to take other medicine(s) before treatment with this medicine. If you experience allergic  reactions like skin rash, itching or hives, swelling of the face, lips, or tongue, tell your  doctor or health care professional right away. In some cases, you may be given additional medicines to help with side effects. Follow all directions for their use. This drug may make you feel generally unwell. This is not uncommon, as chemotherapy can affect healthy cells as well as cancer cells. Report any side effects. Continue your course of treatment even though you feel ill unless your doctor tells you to stop. Call your doctor or health care professional for advice if you get a fever, chills or sore throat, or other symptoms of a cold or flu. Do not treat yourself. This drug decreases your body's ability to fight infections. Try to avoid being around people who are sick. This medicine may increase your risk to bruise or bleed. Call your doctor or health care professional if you notice any unusual bleeding. Be careful brushing and flossing your teeth or using a toothpick because you may get an infection or bleed more easily. If you have any dental work done, tell your dentist you are receiving this medicine. Avoid taking products that contain aspirin, acetaminophen, ibuprofen, naproxen, or ketoprofen unless instructed by your doctor. These medicines may hide a fever. Do not become pregnant while taking this medicine. Women should inform their doctor if they wish to become pregnant or think they might be pregnant. There is a potential for serious side effects to an unborn child. Talk to your health care professional or pharmacist for more information. Do not breast-feed an infant while taking this medicine. Men are advised not to father a child while receiving this medicine. This product may contain alcohol. Ask your pharmacist or healthcare provider if this medicine contains alcohol. Be sure to tell all healthcare providers you are taking this medicine. Certain medicines, like metronidazole and disulfiram, can cause an unpleasant reaction when taken with alcohol. The reaction includes flushing,  headache, nausea, vomiting, sweating, and increased thirst. The reaction can last from 30 minutes to several hours. What side effects may I notice from receiving this medicine? Side effects that you should report to your doctor or health care professional as soon as possible: -allergic reactions like skin rash, itching or hives, swelling of the face, lips, or tongue -breathing problems -changes in vision -fast, irregular heartbeat -high or low blood pressure -mouth sores -pain, tingling, numbness in the hands or feet -signs of decreased platelets or bleeding - bruising, pinpoint red spots on the skin, black, tarry stools, blood in the urine -signs of decreased red blood cells - unusually weak or tired, feeling faint or lightheaded, falls -signs of infection - fever or chills, cough, sore throat, pain or difficulty passing urine -signs and symptoms of liver injury like dark yellow or brown urine; general ill feeling or flu-like symptoms; light-colored stools; loss of appetite; nausea; right upper belly pain; unusually weak or tired; yellowing of the eyes or skin -swelling of the ankles, feet, hands -unusually slow heartbeat Side effects that usually do not require medical attention (report to your doctor or health care professional if they continue or are bothersome): -diarrhea -hair loss -loss of appetite -muscle or joint pain -nausea, vomiting -pain, redness, or irritation at site where injected -tiredness This list may not describe all possible side effects. Call your doctor for medical advice about side effects. You may report side effects to FDA at 1-800-FDA-1088. Where should I keep my medicine? This drug is given in a hospital or  clinic and will not be stored at home. NOTE: This sheet is a summary. It may not cover all possible information. If you have questions about this medicine, talk to your doctor, pharmacist, or health care provider.  2019 Elsevier/Gold Standard (2017-01-01  13:14:55)

## 2018-06-24 NOTE — Telephone Encounter (Signed)
Scheduled appt  Per 02/11 los.  Printed calendar and avs.

## 2018-07-01 ENCOUNTER — Inpatient Hospital Stay: Payer: Medicaid Other

## 2018-07-01 ENCOUNTER — Ambulatory Visit: Payer: Medicaid Other | Admitting: Hematology

## 2018-07-01 VITALS — BP 136/93 | HR 80 | Temp 99.1°F

## 2018-07-01 DIAGNOSIS — Z903 Acquired absence of stomach [part of]: Secondary | ICD-10-CM

## 2018-07-01 DIAGNOSIS — D500 Iron deficiency anemia secondary to blood loss (chronic): Secondary | ICD-10-CM

## 2018-07-01 DIAGNOSIS — C161 Malignant neoplasm of fundus of stomach: Secondary | ICD-10-CM

## 2018-07-01 DIAGNOSIS — Z95828 Presence of other vascular implants and grafts: Secondary | ICD-10-CM

## 2018-07-01 DIAGNOSIS — Z5111 Encounter for antineoplastic chemotherapy: Secondary | ICD-10-CM | POA: Diagnosis not present

## 2018-07-01 DIAGNOSIS — Z980 Intestinal bypass and anastomosis status: Secondary | ICD-10-CM

## 2018-07-01 DIAGNOSIS — Z934 Other artificial openings of gastrointestinal tract status: Secondary | ICD-10-CM

## 2018-07-01 LAB — CMP (CANCER CENTER ONLY)
ALT: 9 U/L (ref 0–44)
AST: 16 U/L (ref 15–41)
Albumin: 3.4 g/dL — ABNORMAL LOW (ref 3.5–5.0)
Alkaline Phosphatase: 103 U/L (ref 38–126)
Anion gap: 7 (ref 5–15)
BUN: 5 mg/dL — ABNORMAL LOW (ref 6–20)
CO2: 27 mmol/L (ref 22–32)
CREATININE: 0.7 mg/dL (ref 0.61–1.24)
Calcium: 8.3 mg/dL — ABNORMAL LOW (ref 8.9–10.3)
Chloride: 105 mmol/L (ref 98–111)
GFR, Est AFR Am: >60 mL/min (ref >60)
GFR, Estimated: >60 mL/min (ref >60)
Glucose, Bld: 68 mg/dL — ABNORMAL LOW (ref 70–99)
Potassium: 3.9 mmol/L (ref 3.5–5.1)
Sodium: 139 mmol/L (ref 135–145)
Total Bilirubin: 0.4 mg/dL (ref 0.3–1.2)
Total Protein: 6.3 g/dL — ABNORMAL LOW (ref 6.5–8.1)

## 2018-07-01 LAB — CBC WITH DIFFERENTIAL (CANCER CENTER ONLY)
Abs Immature Granulocytes: 0.01 10*3/uL (ref 0.00–0.07)
Basophils Absolute: 0 10*3/uL (ref 0.0–0.1)
Basophils Relative: 1 %
EOS PCT: 0 %
Eosinophils Absolute: 0 10*3/uL (ref 0.0–0.5)
HCT: 29.9 % — ABNORMAL LOW (ref 39.0–52.0)
Hemoglobin: 10.6 g/dL — ABNORMAL LOW (ref 13.0–17.0)
Immature Granulocytes: 0 %
Lymphocytes Relative: 64 %
Lymphs Abs: 2.8 10*3/uL (ref 0.7–4.0)
MCH: 29.2 pg (ref 26.0–34.0)
MCHC: 35.5 g/dL (ref 30.0–36.0)
MCV: 82.4 fL (ref 80.0–100.0)
MONO ABS: 0.3 10*3/uL (ref 0.1–1.0)
Monocytes Relative: 7 %
Neutro Abs: 1.2 10*3/uL — ABNORMAL LOW (ref 1.7–7.7)
Neutrophils Relative %: 28 %
Platelet Count: 535 10*3/uL — ABNORMAL HIGH (ref 150–400)
RBC: 3.63 MIL/uL — ABNORMAL LOW (ref 4.22–5.81)
RDW: 18.9 % — ABNORMAL HIGH (ref 11.5–15.5)
WBC Count: 4.4 10*3/uL (ref 4.0–10.5)
nRBC: 0 % (ref 0.0–0.2)

## 2018-07-01 MED ORDER — DEXAMETHASONE SODIUM PHOSPHATE 10 MG/ML IJ SOLN
INTRAMUSCULAR | Status: AC
  Filled 2018-07-01: qty 1

## 2018-07-01 MED ORDER — SODIUM CHLORIDE 0.9 % IV SOLN
Freq: Once | INTRAVENOUS | Status: AC
  Administered 2018-07-01: 14:00:00 via INTRAVENOUS
  Filled 2018-07-01: qty 250

## 2018-07-01 MED ORDER — HEPARIN SOD (PORK) LOCK FLUSH 100 UNIT/ML IV SOLN
500.0000 [IU] | Freq: Once | INTRAVENOUS | Status: AC | PRN
  Administered 2018-07-01: 500 [IU]
  Filled 2018-07-01: qty 5

## 2018-07-01 MED ORDER — DIPHENHYDRAMINE HCL 50 MG/ML IJ SOLN
INTRAMUSCULAR | Status: AC
  Filled 2018-07-01: qty 1

## 2018-07-01 MED ORDER — SODIUM CHLORIDE 0.9% FLUSH
10.0000 mL | INTRAVENOUS | Status: DC | PRN
  Administered 2018-07-01: 10 mL
  Filled 2018-07-01: qty 10

## 2018-07-01 MED ORDER — DIPHENHYDRAMINE HCL 50 MG/ML IJ SOLN
50.0000 mg | Freq: Once | INTRAMUSCULAR | Status: AC
  Administered 2018-07-01: 50 mg via INTRAVENOUS

## 2018-07-01 MED ORDER — SODIUM CHLORIDE 0.9 % IV SOLN
80.0000 mg/m2 | Freq: Once | INTRAVENOUS | Status: AC
  Administered 2018-07-01: 150 mg via INTRAVENOUS
  Filled 2018-07-01: qty 25

## 2018-07-01 MED ORDER — ACETAMINOPHEN 325 MG PO TABS
650.0000 mg | ORAL_TABLET | Freq: Once | ORAL | Status: AC
  Administered 2018-07-01: 650 mg via ORAL

## 2018-07-01 MED ORDER — ACETAMINOPHEN 325 MG PO TABS
ORAL_TABLET | ORAL | Status: AC
  Filled 2018-07-01: qty 1

## 2018-07-01 MED ORDER — SODIUM CHLORIDE 0.9% FLUSH
10.0000 mL | Freq: Once | INTRAVENOUS | Status: AC
  Administered 2018-07-01: 10 mL
  Filled 2018-07-01: qty 10

## 2018-07-01 MED ORDER — DEXAMETHASONE SODIUM PHOSPHATE 10 MG/ML IJ SOLN
5.0000 mg | Freq: Once | INTRAMUSCULAR | Status: AC
  Administered 2018-07-01: 5 mg via INTRAVENOUS

## 2018-07-01 MED ORDER — FAMOTIDINE IN NACL 20-0.9 MG/50ML-% IV SOLN
INTRAVENOUS | Status: AC
  Filled 2018-07-01: qty 50

## 2018-07-01 MED ORDER — FAMOTIDINE IN NACL 20-0.9 MG/50ML-% IV SOLN
20.0000 mg | Freq: Once | INTRAVENOUS | Status: AC
  Administered 2018-07-01: 20 mg via INTRAVENOUS

## 2018-07-01 MED ORDER — SODIUM CHLORIDE 0.9 % IV SOLN
8.0000 mg/kg | Freq: Once | INTRAVENOUS | Status: AC
  Administered 2018-07-01: 600 mg via INTRAVENOUS
  Filled 2018-07-01: qty 50

## 2018-07-01 NOTE — Patient Instructions (Signed)
[facility] Discharge Instructions for Patients Receiving Chemotherapy  Today you received the following chemotherapy agents taxol; cyramza  To help prevent nausea and vomiting after your treatment, we encourage you to take your nausea medication as directed.   If you develop nausea and vomiting that is not controlled by your nausea medication, call the clinic.   BELOW ARE SYMPTOMS THAT SHOULD BE REPORTED IMMEDIATELY:  *FEVER GREATER THAN 100.5 F  *CHILLS WITH OR WITHOUT FEVER  NAUSEA AND VOMITING THAT IS NOT CONTROLLED WITH YOUR NAUSEA MEDICATION  *UNUSUAL SHORTNESS OF BREATH  *UNUSUAL BRUISING OR BLEEDING  TENDERNESS IN MOUTH AND THROAT WITH OR WITHOUT PRESENCE OF ULCERS  *URINARY PROBLEMS  *BOWEL PROBLEMS  UNUSUAL RASH Items with * indicate a potential emergency and should be followed up as soon as possible.  Feel free to call the clinic you have any questions or concerns. The clinic phone number is [phone].  Paclitaxel injection What is this medicine? PACLITAXEL (PAK li TAX el) is a chemotherapy drug. It targets fast dividing cells, like cancer cells, and causes these cells to die. This medicine is used to treat ovarian cancer, breast cancer, lung cancer, Kaposi's sarcoma, and other cancers. This medicine may be used for other purposes; ask your health care provider or pharmacist if you have questions. COMMON BRAND NAME(S): Onxol, Taxol What should I tell my health care provider before I take this medicine? They need to know if you have any of these conditions: -history of irregular heartbeat -liver disease -low blood counts, like low white cell, platelet, or red cell counts -lung or breathing disease, like asthma -tingling of the fingers or toes, or other nerve disorder -an unusual or allergic reaction to paclitaxel, alcohol, polyoxyethylated castor oil, other chemotherapy, other medicines, foods, dyes, or preservatives -pregnant or trying to get  pregnant -breast-feeding How should I use this medicine? This drug is given as an infusion into a vein. It is administered in a hospital or clinic by a specially trained health care professional. Talk to your pediatrician regarding the use of this medicine in children. Special care may be needed. Overdosage: If you think you have taken too much of this medicine contact a poison control center or emergency room at once. NOTE: This medicine is only for you. Do not share this medicine with others. What if I miss a dose? It is important not to miss your dose. Call your doctor or health care professional if you are unable to keep an appointment. What may interact with this medicine? Do not take this medicine with any of the following medications: -disulfiram -metronidazole This medicine may also interact with the following medications: -antiviral medicines for hepatitis, HIV or AIDS -certain antibiotics like erythromycin and clarithromycin -certain medicines for fungal infections like ketoconazole and itraconazole -certain medicines for seizures like carbamazepine, phenobarbital, phenytoin -gemfibrozil -nefazodone -rifampin -St. John's wort This list may not describe all possible interactions. Give your health care provider a list of all the medicines, herbs, non-prescription drugs, or dietary supplements you use. Also tell them if you smoke, drink alcohol, or use illegal drugs. Some items may interact with your medicine. What should I watch for while using this medicine? Your condition will be monitored carefully while you are receiving this medicine. You will need important blood work done while you are taking this medicine. This medicine can cause serious allergic reactions. To reduce your risk you will need to take other medicine(s) before treatment with this medicine. If you experience allergic  reactions like skin rash, itching or hives, swelling of the face, lips, or tongue, tell your  doctor or health care professional right away. In some cases, you may be given additional medicines to help with side effects. Follow all directions for their use. This drug may make you feel generally unwell. This is not uncommon, as chemotherapy can affect healthy cells as well as cancer cells. Report any side effects. Continue your course of treatment even though you feel ill unless your doctor tells you to stop. Call your doctor or health care professional for advice if you get a fever, chills or sore throat, or other symptoms of a cold or flu. Do not treat yourself. This drug decreases your body's ability to fight infections. Try to avoid being around people who are sick. This medicine may increase your risk to bruise or bleed. Call your doctor or health care professional if you notice any unusual bleeding. Be careful brushing and flossing your teeth or using a toothpick because you may get an infection or bleed more easily. If you have any dental work done, tell your dentist you are receiving this medicine. Avoid taking products that contain aspirin, acetaminophen, ibuprofen, naproxen, or ketoprofen unless instructed by your doctor. These medicines may hide a fever. Do not become pregnant while taking this medicine. Women should inform their doctor if they wish to become pregnant or think they might be pregnant. There is a potential for serious side effects to an unborn child. Talk to your health care professional or pharmacist for more information. Do not breast-feed an infant while taking this medicine. Men are advised not to father a child while receiving this medicine. This product may contain alcohol. Ask your pharmacist or healthcare provider if this medicine contains alcohol. Be sure to tell all healthcare providers you are taking this medicine. Certain medicines, like metronidazole and disulfiram, can cause an unpleasant reaction when taken with alcohol. The reaction includes flushing,  headache, nausea, vomiting, sweating, and increased thirst. The reaction can last from 30 minutes to several hours. What side effects may I notice from receiving this medicine? Side effects that you should report to your doctor or health care professional as soon as possible: -allergic reactions like skin rash, itching or hives, swelling of the face, lips, or tongue -breathing problems -changes in vision -fast, irregular heartbeat -high or low blood pressure -mouth sores -pain, tingling, numbness in the hands or feet -signs of decreased platelets or bleeding - bruising, pinpoint red spots on the skin, black, tarry stools, blood in the urine -signs of decreased red blood cells - unusually weak or tired, feeling faint or lightheaded, falls -signs of infection - fever or chills, cough, sore throat, pain or difficulty passing urine -signs and symptoms of liver injury like dark yellow or brown urine; general ill feeling or flu-like symptoms; light-colored stools; loss of appetite; nausea; right upper belly pain; unusually weak or tired; yellowing of the eyes or skin -swelling of the ankles, feet, hands -unusually slow heartbeat Side effects that usually do not require medical attention (report to your doctor or health care professional if they continue or are bothersome): -diarrhea -hair loss -loss of appetite -muscle or joint pain -nausea, vomiting -pain, redness, or irritation at site where injected -tiredness This list may not describe all possible side effects. Call your doctor for medical advice about side effects. You may report side effects to FDA at 1-800-FDA-1088. Where should I keep my medicine? This drug is given in a hospital or  clinic and will not be stored at home. NOTE: This sheet is a summary. It may not cover all possible information. If you have questions about this medicine, talk to your doctor, pharmacist, or health care provider.  2019 Elsevier/Gold Standard (2017-01-01  13:14:55)

## 2018-07-01 NOTE — Progress Notes (Signed)
Per Dr. Feng, OK to treat today with current labs. 

## 2018-07-03 ENCOUNTER — Ambulatory Visit: Payer: Medicaid Other

## 2018-07-03 ENCOUNTER — Other Ambulatory Visit: Payer: Medicaid Other

## 2018-07-07 ENCOUNTER — Ambulatory Visit: Payer: Medicaid Other | Admitting: Hematology

## 2018-07-11 ENCOUNTER — Ambulatory Visit (INDEPENDENT_AMBULATORY_CARE_PROVIDER_SITE_OTHER): Payer: Medicaid Other | Admitting: Family Medicine

## 2018-07-11 ENCOUNTER — Encounter: Payer: Self-pay | Admitting: Family Medicine

## 2018-07-11 VITALS — BP 128/95 | HR 65 | Temp 97.4°F | Resp 16 | Ht 71.0 in | Wt 152.0 lb

## 2018-07-11 DIAGNOSIS — Z23 Encounter for immunization: Secondary | ICD-10-CM

## 2018-07-11 DIAGNOSIS — I10 Essential (primary) hypertension: Secondary | ICD-10-CM

## 2018-07-11 LAB — POCT URINALYSIS DIPSTICK
Bilirubin, UA: NEGATIVE
Blood, UA: NEGATIVE
Glucose, UA: NEGATIVE
Ketones, UA: NEGATIVE
Leukocytes, UA: NEGATIVE
Nitrite, UA: NEGATIVE
Protein, UA: NEGATIVE
Spec Grav, UA: <=1.005 — AB (ref 1.010–1.025)
Urobilinogen, UA: 0.2 E.U./dL
pH, UA: 6 (ref 5.0–8.0)

## 2018-07-11 MED ORDER — AMLODIPINE BESYLATE 2.5 MG PO TABS: 2.5000 mg | ORAL_TABLET | Freq: Every day | ORAL | 3 refills | Status: DC

## 2018-07-11 NOTE — Patient Instructions (Signed)
Amlodipine tablets  What is this medicine?  AMLODIPINE (am LOE di peen) is a calcium-channel blocker. It affects the amount of calcium found in your heart and muscle cells. This relaxes your blood vessels, which can reduce the amount of work the heart has to do. This medicine is used to lower high blood pressure. It is also used to prevent chest pain.  This medicine may be used for other purposes; ask your health care provider or pharmacist if you have questions.  COMMON BRAND NAME(S): Norvasc  What should I tell my health care provider before I take this medicine?  They need to know if you have any of these conditions:  -heart disease  -liver disease  -an unusual or allergic reaction to amlodipine, other medicines, foods, dyes, or preservatives  -pregnant or trying to get pregnant  -breast-feeding  How should I use this medicine?  Take this medicine by mouth with a glass of water. Follow the directions on the prescription label. You can take it with or without food. If it upsets your stomach, take it with food. Take your medicine at regular intervals. Do not take it more often than directed. Do not stop taking except on your doctor's advice.  Talk to your pediatrician regarding the use of this medicine in children. While this drug may be prescribed for children as young as 6 years for selected conditions, precautions do apply.  Patients over 65 years of age may have a stronger reaction and need a smaller dose.  Overdosage: If you think you have taken too much of this medicine contact a poison control center or emergency room at once.  NOTE: This medicine is only for you. Do not share this medicine with others.  What if I miss a dose?  If you miss a dose, take it as soon as you can. If it is almost time for your next dose, take only that dose. Do not take double or extra doses.  What may interact with this medicine?  Do not take this medicine with any of the following medications:  -tranylcypromine  This medicine  may also interact with the following medications:  -clarithromycin  -cyclosporine  -diltiazem  -itraconazole  -simvastatin  -tacrolimus  This list may not describe all possible interactions. Give your health care provider a list of all the medicines, herbs, non-prescription drugs, or dietary supplements you use. Also tell them if you smoke, drink alcohol, or use illegal drugs. Some items may interact with your medicine.  What should I watch for while using this medicine?  Visit your healthcare professional for regular checks on your progress. Check your blood pressure as directed. Ask your healthcare professional what your blood pressure should be and when you should contact him or her.  Do not treat yourself for coughs, colds, or pain while you are using this medicine without asking your healthcare professional for advice. Some medicines may increase your blood pressure.  You may get dizzy. Do not drive, use machinery, or do anything that needs mental alertness until you know how this medicine affects you. Do not stand or sit up quickly, especially if you are an older patient. This reduces the risk of dizzy or fainting spells. Avoid alcoholic drinks; they can make you dizzier.  What side effects may I notice from receiving this medicine?  Side effects that you should report to your doctor or health care professional as soon as possible:  -allergic reactions like skin rash, itching or hives; swelling of the face,   lips, or tongue  -fast, irregular heartbeat  -signs and symptoms of low blood pressure like dizziness; feeling faint or lightheaded, falls; unusually weak or tired  -swelling of ankles, feet, hands  Side effects that usually do not require medical attention (report these to your doctor or health care professional if they continue or are bothersome):  -dry mouth  -facial flushing  -headache  -stomach pain  -tiredness  This list may not describe all possible side effects. Call your doctor for medical advice  about side effects. You may report side effects to FDA at 1-800-FDA-1088.  Where should I keep my medicine?  Keep out of the reach of children.  Store at room temperature between 59 and 86 degrees F (15 and 30 degrees C).  Throw away any unused medicine after the expiration date.  NOTE: This sheet is a summary. It may not cover all possible information. If you have questions about this medicine, talk to your doctor, pharmacist, or health care provider.  © 2019 Elsevier/Gold Standard (2017-11-22 15:07:10)

## 2018-07-11 NOTE — Progress Notes (Signed)
[facility]   Progress Note: General Provider: Lanae Boast, FNP  SUBJECTIVE:   Ryan Collins is a 58 y.o. male who  has a past medical history of Cancer ([place]), DVT (deep venous thrombosis) ([place]) (2011), GI bleeding (2001), History of DVT of lower extremity, Hypertension, Melena (05/2017), and Stomach ulcer.. Patient presents today for Hypertension Patient presents for follow-up on hypertension.  He is currently not taking any antihypertensive medications.  He has a history of taking amlodipine 5 mg.  States that he was discontinued due to blood pressure being under control.  He has had consistent high diastolic readings over the past several months.  He denies chest pain shortness of breath dizziness or leg swelling. He is currently being followed by the cancer center for gastric cancer.  He states that he recently had to begin chemotherapy again because metastasis to the lung.    Review of Systems  Constitutional: Negative.   HENT: Negative.   Eyes: Negative.   Respiratory: Negative.   Cardiovascular: Negative.   Gastrointestinal: Negative.   Genitourinary: Negative.   Musculoskeletal: Negative.   Skin: Negative.   Neurological: Negative.   Psychiatric/Behavioral: Negative.      OBJECTIVE: BP (!) 128/95 (BP Location: Left Arm, Patient Position: Sitting, Cuff Size: Normal)   Pulse 65   Temp (!) 97.4 F (36.3 C) (Oral)   Resp 16   Ht 5\' 11"  (1.803 m)   Wt 152 lb (68.9 kg)   SpO2 100%   BMI 21.20 kg/m   Wt Readings from Last 3 Encounters:  07/11/18 152 lb (68.9 kg)  06/24/18 148 lb 14.4 oz (67.5 kg)  06/13/18 155 lb 6.4 oz (70.5 kg)     Physical Exam Vitals signs and nursing note reviewed.  Constitutional:      General: He is not in acute distress.    Appearance: He is well-developed.  HENT:     Head: Normocephalic and atraumatic.  Eyes:     Conjunctiva/sclera: Conjunctivae normal.     Pupils: Pupils are equal,  round, and reactive to light.  Neck:     Musculoskeletal: Normal range of motion.  Cardiovascular:     Rate and Rhythm: Normal rate and regular rhythm.     Heart sounds: Normal heart sounds.  Pulmonary:     Effort: Pulmonary effort is normal. No respiratory distress.     Breath sounds: Normal breath sounds.  Abdominal:     General: Bowel sounds are normal. There is no distension.     Palpations: Abdomen is soft.  Musculoskeletal: Normal range of motion.  Skin:    General: Skin is warm and dry.  Neurological:     Mental Status: He is alert and oriented to person, place, and time.  Psychiatric:        Behavior: Behavior normal.        Thought Content: Thought content normal.     ASSESSMENT/PLAN:   1. Essential hypertension Will start on low dose of Norvasc due to high diastolic readings that are consistently in the high 90s. Discussed eating small meals throughout the day that are low in sodium.  - Urinalysis Dipstick - amLODipine (NORVASC) 2.5 MG tablet; Take 1 tablet (2.5 mg total) by mouth daily.  Dispense: 90 tablet; Refill: 3     Return in about 3 months (around 10/09/2018) for htn .    The patient was given clear instructions to go to ER or return to medical center if symptoms do  not improve, worsen or new problems develop. The patient verbalized understanding and agreed with plan of care.   Ms. Doug Sou. Nathaneil Canary, FNP-BC [facility] [address] [place], [place] [address] [phone]

## 2018-07-11 NOTE — Progress Notes (Signed)
[place]   [phone] [phone]   Clinic Follow up Note   Patient Care Team: Lanae Boast, [place] as PCP - General (Family Medicine)  Date of Service:  07/14/2018  CHIEF COMPLAINT: F/u of gastric cancer  SUMMARY OF ONCOLOGIC HISTORY: Oncology History   Cancer Staging Gastric cancer [facility]) Staging form: Stomach, AJCC 8th Edition - Clinical stage from 06/04/2017: Stage IVB (cT4, cN0, cM1) - Signed by [name], MD on 06/10/2017 - Pathologic stage from 02/25/2018: Stage III (ypT3, pN3a, cM0) - Signed by [name], MD on 04/01/2018       Gastric cancer ([place])   06/03/2017 - 06/06/2017 Hospital Admission    Admitted to the hospital on 06/03/17 with complaints of fatigue and melena. During his stay he underwent an endoscopy that revealed a malignant gastric tumor in the cardia and in the gastric fundus. Biopsy results revealed adenocarcinoma. Pt was discharged on 06/06/17.    06/04/2017 Initial Biopsy    Diagnosis 06/04/17 Stomach, biopsy, Fundus - ADENOCARCINOMA - SEE COMMENT Microscopic Comment The stomach biopsy is of an adenocarcinoma arising in a background of intestinal metaplasia. A Warthin-Starry stain is performed to determine the possibility of the presence of Helicobacter pylori. The Warthin-Starry stain is negative for organisms morphologically consistent with Helicobacter pylori.    06/04/2017 Procedure    Esophagogastroduodenoscopy 06/04/17 by Dr. Benson Norway Impression: Normal esophagus. Malignant gastric tumor in the cardia and in the  gastric fundus. Biopsied. Normal examined duodenum.    06/04/2017 Miscellaneous    HER2 (-) EBV (-) MSI-S PD-L1 CPS 5%    06/05/2017 Imaging    CT CAP W Contrast 06/05/17 IMPRESSION: 9.1 cm fungating mass in the gastric cardia/posterior gastric fundus, corresponding to the patient's newly diagnosed gastric Cancer. Associated extra gastric extension with peritoneal disease in the left upper abdomen, including  a dominant 4.2 cm peritoneal implant. Small volume pelvic ascites. 1.8 cm hypoenhancing lesion along the inferior spleen, indeterminate.    06/10/2017 Initial Diagnosis    Gastric cancer ([place])    06/26/2017 - 01/22/2018 Chemotherapy    First line chemo FOLFOX every 2 weeks     08/29/2017 Imaging    IMPRESSION: 1. Interval decrease in size of fungating ulcerative mass arising from the posterior gastric fundus. 2. Left peritoneal implant, compatible with peritoneal carcinomatosis, has decreased in size from the previous exam. 3. Right lobe of liver lesion present on previous exam is less conspicuous on today's study. A new lesion is identified within the left lobe of liver, suspicious for metastasis. Additional focus of low attenuation within segment 5 adjacent to the gallbladder fossa may represent focal fatty deposition. 4. Similar appearance of mildly complex cyst containing mural calcification and possible internal septation arising from the inferior pole of right kidney. More definitive characterization of this lesion with renal protocol MRI may be helpful. 5.  Mild prostate gland enlargement. 6.  Aortic Atherosclerosis (ICD10-I70.0).    11/25/2017 Imaging    11/25/2017 CT AP W Contrast IMPRESSION: 1. Mixed appearance, with the gastric fundal mass thicker than it was on 08/29/2017 (although still improved compared to 06/05/2017), but with reduced local conglomerate adenopathy along the splenic hilum compared to 08/29/2017. 2. At this time I do not see any definite hepatic metastatic lesions. Several lesions of suspicion in the left hepatic lobe and inferiorly in the right hepatic lobe are not readily visible today. There is a small focus of hypodensity adjacent to the gallbladder fossa in segment 7 which is technically nonspecific but  which could reflect focal fatty infiltration. 3. Increase in nodular contour of scarring in the left lower lobe. This is probably incidental but I cannot  completely exclude the possibility of a localized metastatic lesion. Surveillance of the left lower lobe suggested. 4. Bosniak category 1F cyst of the right kidney lower pole with internal septation and calcification. Surveillance of this lesion is suggested; I suspect that the patient's cancer surveillance will supersede the typical recommended surveillance schedule for complex cyst. 5. Other imaging findings of potential clinical significance: Aortic Atherosclerosis (ICD10-I70.0). Degenerative glenohumeral arthropathy bilaterally. Mild impingement at L4-5 and L5-S1.    12/23/2017 Imaging    12/23/2017 MRI Abdomen IMPRESSION: Ill-defined soft tissue mass in the gastric fundus shows no significant change, consistent known gastric adenocarcinoma.  No definite liver metastases identified. Hemosiderosis noted, with focal area of sparing seen in the right hepatic lobe which accounts for the lesions seen on recent CT.  Stable 1.9 cm probably benign Bosniak category 2 F cystic lesion in right kidney. Recommend continued attention on follow-up imaging.    02/12/2018 Imaging    02/12/2018 CT CAP IMPRESSION: 1. Roughly similar appearance of the mass involving the gastric cardia/fundus. 2. Hepatic steatosis with some areas of fatty sparing as shown on MRI. No definite focal metastatic lesion to the liver is identified. 3. Nonspecific 0.8 cm lymph node between the tail the pancreas in the splenic hilum. There is also a mildly enlarged but stable right hilar lymph node at 1.0 cm in diameter, significance uncertain. 4. Overall stable appearance of the 1.9 cm Bosniak category 1F cyst of the right kidney lower pole. We have now documented 9 months of relative stability. Surveillance at or before 6 months time is recommended. 5. Other imaging findings of potential clinical significance: Aortic Atherosclerosis (ICD10-I70.0). Small anterior pericardial effusion. Stable scarring in the left lower  lobe. Medial hypoenhancement of the upper spleen is probably from early phase of contrast, less likely to be splenic infarct given the relatively normal appearance on delayed images. Impingement at L4-5 and L5-S1.    02/25/2018 Surgery    On 02/25/2018, he had a diagnostic laparoscopy, liver biopsy, total gastrectomy with esophagojejunostomy en bloc with distal pancreatectomy/splenectomy, feeding jejunostomy tube with Dr. Barry Dienes.     02/25/2018 Pathology Results    02/25/2018 Surgical Pathology Diagnosis 1. Soft tissue, biopsy, Diaphragmatic Nodule - NEGATIVE FOR CARCINOMA. 2. Liver, biopsy - LIVER PARENCHYMA, NEGATIVE FOR CARCINOMA. 3. Liver, biopsy, Right - LIVER PARENCHYMA WITH CAPSULAR FIBROSIS. - NEGATIVE FOR CARCINOMA. 4. Soft tissue, biopsy, Falciform Nodule - LIVER PARENCHYMA WITH FIBROTIC BANDS. SEE NOTE. - NEGATIVE FOR CARCINOMA. 5. Omentum, resection for tumor - BENIGN PERITONEUM-LINED ADIPOSE TISSUE. - NEGATIVE FOR CARCINOMA. 6. Stomach, resection for tumor, distal pancreas and spleen - INVASIVE MODERATELY TO POORLY DIFFERENTIATED GASTRIC CARCINOMA, 7 CM. - CARCINOMA INVADES INTO PERIGASTRIC SOFT TISSUE AND IS ADHERENT TO PANCREAS AND SPLEEN BUT DOES NOT INVADE INTO PANCREATIC OR SPLENIC PARENCHYMA. - NO EVIDENCE OF LYMPHOVASCULAR OR PERINEURAL INVASION. - MINIMAL THERAPEUTIC RESPONSE (TUMOR REGRESSION SCORE 3). - METASTATIC CARCINOMA TO EIGHT OF THIRTY-SEVEN LYMPH NODES (8/37). - ALL RESECTION MARGINS ARE NEGATIVE FOR CARCINOMA. - BENIGN UNREMARKABLE PANCREAS. - SEE ONCOLOGY TABLE.    02/25/2018 Cancer Staging    Staging form: Stomach, AJCC 8th Edition - Pathologic stage from 02/25/2018: Stage III (ypT3, pN3a, cM0) - Signed by [name], MD on 04/01/2018    06/05/2018 Imaging    CT CAP W COntrast 06/05/18  IMPRESSION: 1. Interval gastrectomy with esophagojejunostomy in the  lower chest. Splenectomy and partial pancreatectomy. 2. At least 35 new pulmonary nodules  are present, measuring up to 1.5 cm in diameter, high suspicion for metastatic disease. 3. Portacaval and porta hepatis adenopathy, suspicious for malignancy. 4. Although I do not see a definite liver tumor, sensitivity for small lesions is mildly adversely affected due to the late arterial phase of contrast administration. 5. New thoracic spine compression fractures at T6, T7, T2, T3, and T4 as detailed above. New lower sternal manubrial fracture. 6. Right inguinal hernia containing a loop of small bowel, no overt obstruction or strangulation currently evident. 7. Diffuse mild subcutaneous and mesenteric edema, cause uncertain. 8. Other imaging findings of potential clinical significance: Aortic Atherosclerosis (ICD10-I70.0). Lower lumbar spondylosis and degenerative disc disease.    06/16/2018 -  Chemotherapy    The patient had PACLitaxel (TAXOL) 150 mg in sodium chloride 0.9 % 250 mL chemo infusion (</= 63m/m2), 80 mg/m2 = 150 mg, Intravenous,  Once, 1 of 6 cycles Administration: 150 mg (06/24/2018), 150 mg (07/01/2018), 150 mg (06/17/2018) ramucirumab (CYRAMZA) 600 mg in sodium chloride 0.9 % 190 mL chemo infusion, 8 mg/kg = 600 mg, Intravenous, Once, 1 of 6 cycles Administration: 600 mg (06/16/2018), 600 mg (07/01/2018)  for chemotherapy treatment.       CURRENT THERAPY:  On second line chemo weekly taxol andCyramza starting 06/16/2018   INTERVAL HISTORY:  EArnie Clingenpeelis here for a follow up and treatment. He presents to the clinic today by himself. He notes he is doing well overall. He notes his back pain is gone lately. He notes he tolerated cycle 1 chemo well with no major side effects. He notes he has stable neuropathy in fingers and toes. He notes he is on BP medication with amlodipine which was lowered to 2.546mdaily. BP at 14181 today (07/14/18). He denies any dizziness. He notes he is able to take care of himself and his brother helps with transportation.    REVIEW OF SYSTEMS:    Constitutional: Denies fevers, chills or abnormal weight loss Eyes: Denies blurriness of vision Ears, nose, mouth, throat, and face: Denies mucositis or sore throat Respiratory: Denies cough, dyspnea or wheezes Cardiovascular: Denies palpitation, chest discomfort or lower extremity swelling Gastrointestinal:  Denies nausea, heartburn or change in bowel habits Skin: Denies abnormal skin rashes Lymphatics: Denies new lymphadenopathy or easy bruising Neurological:Denies new weaknesses (+) Stable neuropathy in b/l fingers and toes  Behavioral/Psych: Mood is stable, no new changes  All other systems were reviewed with the patient and are negative.  MEDICAL HISTORY:  Past Medical History:  Diagnosis Date  . [facility]   g[facility]  . D[place]) [place]  . GI bleeding 2001  . History of DVT of lower extremity   . Hypertension   . Melena 05/2017  . Stomach ulcer    Clinically suspected; No EGD confirmation as of 06/03/17    SURGICAL HISTORY: Past Surgical History:  Procedure Laterality Date  . ESOPHAGOGASTRODUODENOSCOPY  06/04/2017  . ESOPHAGOGASTRODUODENOSCOPY N/A 06/04/2017   Procedure: ESOPHAGOGASTRODUODENOSCOPY (EGD);  Surgeon: HuCarol AdaMD;  [place] [facility] Service: Endoscopy;  Laterality: N/A;  . GASTRECTOMY N/A 02/25/2018   Procedure: TOTAL GASTRECTOMY WITH J TUBE PLACEMENT;  Surgeon: ByStark KleinMD;  [place] Service: General;  Laterality: N/A;  . IR FLUORO GUIDE PORT INSERTION RIGHT  06/20/2017  . IR [place]IDE VASC ACCESS RIGHT  06/20/2017  . JEJUNOSTOMY N/A 02/25/2018   Procedure: JEJUNOSTOMY TUBE;  Surgeon: [name], MD;  Location: [place];  Service: General;  Laterality: N/A;  . LAPAROSCOPY N/A 02/25/2018   Procedure: LAPAROSCOPY DIAGNOSTIC;  Surgeon: [name], MD;  Location: [place];  Service: General;  Laterality: N/A;  . SPLENECTOMY, TOTAL N/A 02/25/2018   Procedure: SPLENECTOMY;  Surgeon: [name], MD;   Location: [place];  Service: General;  Laterality: N/A;    I have reviewed the social history and family history with the patient and they are unchanged from previous note.  ALLERGIES:  has No Known Allergies.  MEDICATIONS:  Current Outpatient Medications  Medication Sig Dispense Refill  . acetaminophen (TYLENOL) 500 MG tablet Take 1,000 mg by mouth every 6 (six) hours as needed for mild pain.     Marland Kitchen amLODipine (NORVASC) 2.5 MG tablet Take 1 tablet (2.5 mg total) by mouth daily. 90 tablet 3  . lidocaine-prilocaine (EMLA) cream Apply 1 application topically as needed (for port).    . vitamin B-12 (CYANOCOBALAMIN) 1000 MCG tablet Take 1 tablet (1,000 mcg total) by mouth daily. 30 tablet 0  . Amino Acids-Protein Hydrolys (FEEDING SUPPLEMENT, PRO-STAT SUGAR FREE 64,) LIQD Place 30 mLs into feeding tube 3 (three) times daily. (Patient not taking: Reported on 07/11/2018) 900 mL 0  . cyclobenzaprine (FLEXERIL) 10 MG tablet Take 1 tablet (10 mg total) by mouth 3 (three) times daily as needed for muscle spasms. (Patient not taking: Reported on 07/11/2018) 30 tablet 1  . magic mouthwash w/lidocaine SOLN Take 5 mLs by mouth 4 (four) times daily as needed for mouth pain. (Patient not taking: Reported on 07/11/2018) 240 mL 2  . ondansetron (ZOFRAN) 8 MG tablet Take 8 mg by mouth every 8 (eight) hours as needed.    . prochlorperazine (COMPAZINE) 10 MG tablet Take 10 mg by mouth every 6 (six) hours as needed.     No current facility-administered medications for this visit.     PHYSICAL EXAMINATION: ECOG PERFORMANCE STATUS: 1 - Symptomatic but completely ambulatory  Vitals:   07/14/18 1020  BP: (!) 141/81  Pulse: 75  Resp: 18  Temp: 98 F (36.7 C)  SpO2: 100%   Filed Weights   07/14/18 1020  Weight: 150 lb 1.6 oz (68.1 kg)    GENERAL:alert, no distress and comfortable SKIN: skin color, texture, turgor are normal, no rashes or significant lesions EYES: normal, Conjunctiva are pink and  non-injected, sclera clear OROPHARYNX:no exudate, no erythema and lips, buccal mucosa, and tongue normal  NECK: supple, thyroid normal size, non-tender, without nodularity LYMPH:  no palpable lymphadenopathy in the cervical, axillary or inguinal LUNGS: clear to auscultation and percussion with normal breathing effort HEART: regular rate & rhythm and no murmurs and no lower extremity edema ABDOMEN:abdomen soft, non-tender and normal bowel sounds Musculoskeletal:no cyanosis of digits and no clubbing  NEURO: alert & oriented x 3 with fluent speech, no focal motor/sensory deficits  LABORATORY DATA:  I have reviewed the data as listed CBC Latest Ref Rng & Units 07/14/2018 07/01/2018 06/24/2018  WBC 4.0 - 10.5 K/uL 4.5 4.4 7.0  Hemoglobin 13.0 - 17.0 g/dL 10.9(L) 10.6(L) 11.6(L)  Hematocrit 39.0 - 52.0 % 32.0(L) 29.9(L) 34.4(L)  Platelets 150 - 400 K/uL 514(H) 535(H) 368     CMP Latest Ref Rng & Units 07/14/2018 07/01/2018 06/24/2018  Glucose 70 - 99 mg/dL 81 68(L) 88  BUN 6 - 20 mg/dL <4(L) 5(L) 8  Creatinine 0.61 - 1.24 mg/dL 0.68 0.70 0.71  Sodium 135 - 145 mmol/L 139 139 137  Potassium  3.5 - 5.1 mmol/L 3.5 3.9 4.1  Chloride 98 - 111 mmol/L 105 105 100  CO2 22 - 32 mmol/L 26 27 29   Calcium 8.9 - 10.3 mg/dL 8.0(L) 8.3(L) 9.1  Total Protein 6.5 - 8.1 g/dL 6.0(L) 6.3(L) 7.0  Total Bilirubin 0.3 - 1.2 mg/dL 0.4 0.4 0.7  Alkaline Phos 38 - 126 U/L 76 103 155(H)  AST 15 - 41 U/L 16 16 19   ALT 0 - 44 U/L 7 9 12       RADIOGRAPHIC STUDIES: I have personally reviewed the radiological images as listed and agreed with the findings in the report. No results found.   ASSESSMENT & PLAN:  Syed Zukas is a 58 y.o. male with   1. Gastric cancer, T4bNxM1 with presumedperitoneal metastasis, adenocarcinoma, HER2(-), MSI-stable, PD-L1positive (5%), ypT3N3aM0, lung metastasis in 05/2018 -He was diagnosed in 05/2017.Initial staging scan showed a large mass in LUQ,presumably peritoneal metastasis.  He underwent several months of FOLFOX, followed bytotal gastrectomywith esophagojejunostomy en bloc with distal pancreatectomy/splenectomywith J-tube in placein 02/2018 -Due to his prolonged neoadjuvant chemotherapywith FOLOX, I do not think he would benefit more from adjuvant chemo.Due to the D2 resection, adjuvant radiation wasalso not recommended -Unfortunately he developed lung metastasis in Jan 2020 -I have started him on second line chemo weekly taxol andCyramza starting 06/16/2018. He is tolerating well, his back pain has resolved -Labs reviewed, Hg at 10.9, PLT at 514K, ANC at 1.5, BUN <4, CA at 8, Protein at 6, Albumin at 3.1. Overall adequate to proceed with cycle 2 Taxol and Cyramza today  -His Amlodipine was recently dose reduced to 2.84m daily according to patient. His BP today at 141/81. I discussed his chemo can increase BP, will monitor. May increase amlodipine again if uncontrolled.  -F/u in 2 weeks, plan to repeat a staging scan after 3 cycles  2. Anemia of iron deficiency and tumor bleeding, B12 deficiency -He has clinical GI bleeding with melena, iron study also showed iron deficiency. -He has received blood transfusion, and3dose of IV Feraheme previously, responded well. -takes Oral-B complex  -Hg stable at 10.9 today (07/14/18)  3. History of DVT, ? Protein C deficiency -He was on coumadin for his DVT before, and previous labs showed a protein C deficiency, which increases his risk of thrombosis. -We previously discussed the high risk of thrombosis secondary to his underlying malignancy. -he is not on any phylactic anticoagulation.  4. History of alcohol and marijuana abuse -He has quit drinking alcohol since his diagnosis of cancer -Hestilldoes smoke marijuana occasionally  5. Weight loss and malnutrition  -He has lost significant weight since the diagnosisand surgery -Will continue to f/uwith ourdietitian -His J-tube fell outincidentally in  December 2019 -Weight is stable currently, although protein and albumin are still low.   6. Peripheral neuropathy, grade 1, second to chemotherapy oxaliplatin -He has neuropathy on his fingers and toes, stable.   7. Abdominal pain  -Secondary to surgery  -He completed his prescription of oxycodone given by Dr. BBarry Dienes  -Due to his previous substance abuse,I will be cautious about narcotics -takes flexeril, Tylenol and Alkazaltzer. -improved lately   8. Thoracic compression fracture  -I have discussed histhoracicspineMRIfindings with pt, no evidence of bone mets  -continue calcium and Vitd -may consider Zometa infusion every 6 months  -his pain has resolved lately    Plan  -Labs reviewed and adequate to proceed with C2D1 Taxol and Cyramza today -Follow-up in 2 weeks   No problem-specific Assessment & Plan notes found for this  encounter.   No orders of the defined types were placed in this encounter.  All questions were answered. The patient knows to call the clinic with any problems, questions or concerns. No barriers to learning was detected. I spent 20 minutes counseling the patient face to face. The total time spent in the appointment was 25 minutes and more than 50% was on counseling and review of test results     [name], MD 07/14/2018   I, Joslyn Devon, am acting as scribe for [name], MD.   I have reviewed the above documentation for accuracy and completeness, and I agree with the above.

## 2018-07-14 ENCOUNTER — Inpatient Hospital Stay: Payer: Medicaid Other | Attending: Hematology

## 2018-07-14 ENCOUNTER — Telehealth: Payer: Self-pay | Admitting: Hematology

## 2018-07-14 ENCOUNTER — Inpatient Hospital Stay: Payer: Medicaid Other

## 2018-07-14 ENCOUNTER — Inpatient Hospital Stay (HOSPITAL_BASED_OUTPATIENT_CLINIC_OR_DEPARTMENT_OTHER): Payer: Medicaid Other | Admitting: Hematology

## 2018-07-14 ENCOUNTER — Other Ambulatory Visit: Payer: Self-pay

## 2018-07-14 VITALS — BP 141/81 | HR 75 | Temp 98.0°F | Resp 18 | Ht 71.0 in | Wt 150.1 lb

## 2018-07-14 DIAGNOSIS — Z86718 Personal history of other venous thrombosis and embolism: Secondary | ICD-10-CM

## 2018-07-14 DIAGNOSIS — Z95828 Presence of other vascular implants and grafts: Secondary | ICD-10-CM

## 2018-07-14 DIAGNOSIS — F1210 Cannabis abuse, uncomplicated: Secondary | ICD-10-CM | POA: Diagnosis not present

## 2018-07-14 DIAGNOSIS — D509 Iron deficiency anemia, unspecified: Secondary | ICD-10-CM | POA: Insufficient documentation

## 2018-07-14 DIAGNOSIS — D500 Iron deficiency anemia secondary to blood loss (chronic): Secondary | ICD-10-CM

## 2018-07-14 DIAGNOSIS — C786 Secondary malignant neoplasm of retroperitoneum and peritoneum: Secondary | ICD-10-CM | POA: Insufficient documentation

## 2018-07-14 DIAGNOSIS — E46 Unspecified protein-calorie malnutrition: Secondary | ICD-10-CM | POA: Diagnosis not present

## 2018-07-14 DIAGNOSIS — F1010 Alcohol abuse, uncomplicated: Secondary | ICD-10-CM

## 2018-07-14 DIAGNOSIS — E538 Deficiency of other specified B group vitamins: Secondary | ICD-10-CM | POA: Diagnosis not present

## 2018-07-14 DIAGNOSIS — M4854XA Collapsed vertebra, not elsewhere classified, thoracic region, initial encounter for fracture: Secondary | ICD-10-CM | POA: Insufficient documentation

## 2018-07-14 DIAGNOSIS — E876 Hypokalemia: Secondary | ICD-10-CM | POA: Diagnosis not present

## 2018-07-14 DIAGNOSIS — C161 Malignant neoplasm of fundus of stomach: Secondary | ICD-10-CM

## 2018-07-14 DIAGNOSIS — R109 Unspecified abdominal pain: Secondary | ICD-10-CM | POA: Insufficient documentation

## 2018-07-14 DIAGNOSIS — G629 Polyneuropathy, unspecified: Secondary | ICD-10-CM

## 2018-07-14 DIAGNOSIS — Z79899 Other long term (current) drug therapy: Secondary | ICD-10-CM

## 2018-07-14 DIAGNOSIS — Z980 Intestinal bypass and anastomosis status: Secondary | ICD-10-CM

## 2018-07-14 DIAGNOSIS — Z7901 Long term (current) use of anticoagulants: Secondary | ICD-10-CM

## 2018-07-14 DIAGNOSIS — C7800 Secondary malignant neoplasm of unspecified lung: Secondary | ICD-10-CM | POA: Insufficient documentation

## 2018-07-14 DIAGNOSIS — Z934 Other artificial openings of gastrointestinal tract status: Secondary | ICD-10-CM

## 2018-07-14 DIAGNOSIS — Z5112 Encounter for antineoplastic immunotherapy: Secondary | ICD-10-CM | POA: Diagnosis present

## 2018-07-14 DIAGNOSIS — I10 Essential (primary) hypertension: Secondary | ICD-10-CM | POA: Insufficient documentation

## 2018-07-14 DIAGNOSIS — Z5111 Encounter for antineoplastic chemotherapy: Secondary | ICD-10-CM | POA: Diagnosis present

## 2018-07-14 DIAGNOSIS — Z903 Acquired absence of stomach [part of]: Secondary | ICD-10-CM

## 2018-07-14 LAB — CMP (CANCER CENTER ONLY)
ALT: 7 U/L (ref 0–44)
ANION GAP: 8 (ref 5–15)
AST: 16 U/L (ref 15–41)
Albumin: 3.1 g/dL — ABNORMAL LOW (ref 3.5–5.0)
Alkaline Phosphatase: 76 U/L (ref 38–126)
BUN: <4 mg/dL — ABNORMAL LOW (ref 6–20)
CO2: 26 mmol/L (ref 22–32)
Calcium: 8 mg/dL — ABNORMAL LOW (ref 8.9–10.3)
Chloride: 105 mmol/L (ref 98–111)
Creatinine: 0.68 mg/dL (ref 0.61–1.24)
GFR, Estimated: >60 mL/min (ref >60)
Glucose, Bld: 81 mg/dL (ref 70–99)
POTASSIUM: 3.5 mmol/L (ref 3.5–5.1)
Sodium: 139 mmol/L (ref 135–145)
Total Bilirubin: 0.4 mg/dL (ref 0.3–1.2)
Total Protein: 6 g/dL — ABNORMAL LOW (ref 6.5–8.1)

## 2018-07-14 LAB — CBC WITH DIFFERENTIAL (CANCER CENTER ONLY)
Abs Immature Granulocytes: 0.01 10*3/uL (ref 0.00–0.07)
Basophils Absolute: 0 10*3/uL (ref 0.0–0.1)
Basophils Relative: 0 %
Eosinophils Absolute: 0 10*3/uL (ref 0.0–0.5)
Eosinophils Relative: 0 %
HCT: 32 % — ABNORMAL LOW (ref 39.0–52.0)
Hemoglobin: 10.9 g/dL — ABNORMAL LOW (ref 13.0–17.0)
Immature Granulocytes: 0 %
Lymphocytes Relative: 50 %
Lymphs Abs: 2.2 10*3/uL (ref 0.7–4.0)
MCH: 28.7 pg (ref 26.0–34.0)
MCHC: 34.1 g/dL (ref 30.0–36.0)
MCV: 84.2 fL (ref 80.0–100.0)
Monocytes Absolute: 0.7 10*3/uL (ref 0.1–1.0)
Monocytes Relative: 16 %
Neutro Abs: 1.5 10*3/uL — ABNORMAL LOW (ref 1.7–7.7)
Neutrophils Relative %: 34 %
Platelet Count: 514 10*3/uL — ABNORMAL HIGH (ref 150–400)
RBC: 3.8 MIL/uL — ABNORMAL LOW (ref 4.22–5.81)
RDW: 20.1 % — AB (ref 11.5–15.5)
WBC Count: 4.5 10*3/uL (ref 4.0–10.5)
nRBC: 0 % (ref 0.0–0.2)

## 2018-07-14 MED ORDER — SODIUM CHLORIDE 0.9% FLUSH
10.0000 mL | INTRAVENOUS | Status: DC | PRN
  Administered 2018-07-14: 10 mL
  Filled 2018-07-14: qty 10

## 2018-07-14 MED ORDER — HEPARIN SOD (PORK) LOCK FLUSH 100 UNIT/ML IV SOLN
500.0000 [IU] | Freq: Once | INTRAVENOUS | Status: AC | PRN
  Administered 2018-07-14: 500 [IU]
  Filled 2018-07-14: qty 5

## 2018-07-14 MED ORDER — ACETAMINOPHEN 325 MG PO TABS
650.0000 mg | ORAL_TABLET | Freq: Once | ORAL | Status: AC
  Administered 2018-07-14: 650 mg via ORAL

## 2018-07-14 MED ORDER — SODIUM CHLORIDE 0.9% FLUSH
10.0000 mL | Freq: Once | INTRAVENOUS | Status: AC
  Administered 2018-07-14: 10 mL
  Filled 2018-07-14: qty 10

## 2018-07-14 MED ORDER — DIPHENHYDRAMINE HCL 50 MG/ML IJ SOLN
50.0000 mg | Freq: Once | INTRAMUSCULAR | Status: AC
  Administered 2018-07-14: 50 mg via INTRAVENOUS

## 2018-07-14 MED ORDER — DIPHENHYDRAMINE HCL 50 MG/ML IJ SOLN
INTRAMUSCULAR | Status: AC
  Filled 2018-07-14: qty 1

## 2018-07-14 MED ORDER — CYANOCOBALAMIN 1000 MCG/ML IJ SOLN
1000.0000 ug | Freq: Once | INTRAMUSCULAR | Status: AC
  Administered 2018-07-14: 1000 ug via INTRAMUSCULAR

## 2018-07-14 MED ORDER — DEXAMETHASONE SODIUM PHOSPHATE 10 MG/ML IJ SOLN
INTRAMUSCULAR | Status: AC
  Filled 2018-07-14: qty 1

## 2018-07-14 MED ORDER — ACETAMINOPHEN 325 MG PO TABS
ORAL_TABLET | ORAL | Status: AC
  Filled 2018-07-14: qty 2

## 2018-07-14 MED ORDER — SODIUM CHLORIDE 0.9 % IV SOLN
Freq: Once | INTRAVENOUS | Status: AC
  Administered 2018-07-14: 11:00:00 via INTRAVENOUS
  Filled 2018-07-14: qty 250

## 2018-07-14 MED ORDER — SODIUM CHLORIDE 0.9 % IV SOLN
80.0000 mg/m2 | Freq: Once | INTRAVENOUS | Status: AC
  Administered 2018-07-14: 150 mg via INTRAVENOUS
  Filled 2018-07-14: qty 25

## 2018-07-14 MED ORDER — FAMOTIDINE IN NACL 20-0.9 MG/50ML-% IV SOLN: 20.0000 mg | Freq: Once | INTRAVENOUS | Status: DC

## 2018-07-14 MED ORDER — CYANOCOBALAMIN 1000 MCG/ML IJ SOLN
INTRAMUSCULAR | Status: AC
  Filled 2018-07-14: qty 1

## 2018-07-14 MED ORDER — SODIUM CHLORIDE 0.9 % IV SOLN
8.0000 mg/kg | Freq: Once | INTRAVENOUS | Status: AC
  Administered 2018-07-14: 600 mg via INTRAVENOUS
  Filled 2018-07-14: qty 50

## 2018-07-14 MED ORDER — SODIUM CHLORIDE 0.9 % IV SOLN
20.0000 mg | Freq: Once | INTRAVENOUS | Status: AC
  Administered 2018-07-14: 20 mg via INTRAVENOUS
  Filled 2018-07-14: qty 2

## 2018-07-14 MED ORDER — DEXAMETHASONE SODIUM PHOSPHATE 10 MG/ML IJ SOLN
5.0000 mg | Freq: Once | INTRAMUSCULAR | Status: AC
  Administered 2018-07-14: 5 mg via INTRAVENOUS

## 2018-07-14 NOTE — Patient Instructions (Signed)

## 2018-07-14 NOTE — Patient Instructions (Signed)
[facility] Discharge Instructions for Patients Receiving Chemotherapy  Today you received the following chemotherapy agents taxol/cyramza  To help prevent nausea and vomiting after your treatment, we encourage you to take your nausea medication as directed.   If you develop nausea and vomiting that is not controlled by your nausea medication, call the clinic.   BELOW ARE SYMPTOMS THAT SHOULD BE REPORTED IMMEDIATELY:  *FEVER GREATER THAN 100.5 F  *CHILLS WITH OR WITHOUT FEVER  NAUSEA AND VOMITING THAT IS NOT CONTROLLED WITH YOUR NAUSEA MEDICATION  *UNUSUAL SHORTNESS OF BREATH  *UNUSUAL BRUISING OR BLEEDING  TENDERNESS IN MOUTH AND THROAT WITH OR WITHOUT PRESENCE OF ULCERS  *URINARY PROBLEMS  *BOWEL PROBLEMS  UNUSUAL RASH Items with * indicate a potential emergency and should be followed up as soon as possible.  Feel free to call the clinic you have any questions or concerns. The clinic phone number is [phone].  

## 2018-07-14 NOTE — Telephone Encounter (Signed)
Gave avs and calendar ° °

## 2018-07-15 ENCOUNTER — Encounter: Payer: Self-pay | Admitting: Hematology

## 2018-07-22 ENCOUNTER — Inpatient Hospital Stay: Payer: Medicaid Other

## 2018-07-22 ENCOUNTER — Other Ambulatory Visit: Payer: Self-pay | Admitting: Hematology

## 2018-07-22 ENCOUNTER — Other Ambulatory Visit: Payer: Self-pay

## 2018-07-22 VITALS — BP 113/90 | HR 100 | Temp 98.1°F | Resp 18 | Ht 71.0 in | Wt 132.2 lb

## 2018-07-22 DIAGNOSIS — D500 Iron deficiency anemia secondary to blood loss (chronic): Secondary | ICD-10-CM

## 2018-07-22 DIAGNOSIS — R110 Nausea: Secondary | ICD-10-CM

## 2018-07-22 DIAGNOSIS — Z980 Intestinal bypass and anastomosis status: Secondary | ICD-10-CM

## 2018-07-22 DIAGNOSIS — Z903 Acquired absence of stomach [part of]: Secondary | ICD-10-CM

## 2018-07-22 DIAGNOSIS — Z95828 Presence of other vascular implants and grafts: Secondary | ICD-10-CM

## 2018-07-22 DIAGNOSIS — Z934 Other artificial openings of gastrointestinal tract status: Secondary | ICD-10-CM

## 2018-07-22 DIAGNOSIS — E860 Dehydration: Secondary | ICD-10-CM

## 2018-07-22 DIAGNOSIS — C161 Malignant neoplasm of fundus of stomach: Secondary | ICD-10-CM

## 2018-07-22 DIAGNOSIS — Z5112 Encounter for antineoplastic immunotherapy: Secondary | ICD-10-CM | POA: Diagnosis not present

## 2018-07-22 LAB — CBC WITH DIFFERENTIAL (CANCER CENTER ONLY)
Abs Immature Granulocytes: 0.02 10*3/uL (ref 0.00–0.07)
Basophils Absolute: 0 10*3/uL (ref 0.0–0.1)
Basophils Relative: 0 %
Eosinophils Absolute: 0 10*3/uL (ref 0.0–0.5)
Eosinophils Relative: 0 %
HCT: 35.4 % — ABNORMAL LOW (ref 39.0–52.0)
Hemoglobin: 12.2 g/dL — ABNORMAL LOW (ref 13.0–17.0)
Immature Granulocytes: 0 %
Lymphocytes Relative: 26 %
Lymphs Abs: 1.8 10*3/uL (ref 0.7–4.0)
MCH: 28.6 pg (ref 26.0–34.0)
MCHC: 34.5 g/dL (ref 30.0–36.0)
MCV: 82.9 fL (ref 80.0–100.0)
Monocytes Absolute: 0.6 10*3/uL (ref 0.1–1.0)
Monocytes Relative: 9 %
Neutro Abs: 4.5 10*3/uL (ref 1.7–7.7)
Neutrophils Relative %: 65 %
Platelet Count: 467 10*3/uL — ABNORMAL HIGH (ref 150–400)
RBC: 4.27 MIL/uL (ref 4.22–5.81)
RDW: 19.8 % — AB (ref 11.5–15.5)
WBC: 7 10*3/uL (ref 4.0–10.5)
nRBC: 0 % (ref 0.0–0.2)

## 2018-07-22 LAB — CMP (CANCER CENTER ONLY)
ALK PHOS: 71 U/L (ref 38–126)
ALT: 11 U/L (ref 0–44)
AST: 20 U/L (ref 15–41)
Albumin: 3.6 g/dL (ref 3.5–5.0)
Anion gap: 15 (ref 5–15)
BUN: 17 mg/dL (ref 6–20)
CALCIUM: 8.8 mg/dL — AB (ref 8.9–10.3)
CO2: 25 mmol/L (ref 22–32)
Chloride: 96 mmol/L — ABNORMAL LOW (ref 98–111)
Creatinine: 1.03 mg/dL (ref 0.61–1.24)
GFR, Est AFR Am: >60 mL/min (ref >60)
Glucose, Bld: 116 mg/dL — ABNORMAL HIGH (ref 70–99)
Potassium: 3.6 mmol/L (ref 3.5–5.1)
Sodium: 136 mmol/L (ref 135–145)
TOTAL PROTEIN: 7.2 g/dL (ref 6.5–8.1)
Total Bilirubin: 0.8 mg/dL (ref 0.3–1.2)

## 2018-07-22 LAB — FERRITIN: Ferritin: 242 ng/mL (ref 24–336)

## 2018-07-22 LAB — IRON AND TIBC
Iron: 25 ug/dL — ABNORMAL LOW (ref 42–163)
Saturation Ratios: 13 % — ABNORMAL LOW (ref 20–55)
TIBC: 194 ug/dL — ABNORMAL LOW (ref 202–409)
UIBC: 169 ug/dL (ref 117–376)

## 2018-07-22 LAB — CEA (IN HOUSE-CHCC): CEA (CHCC-In House): 46.42 ng/mL — ABNORMAL HIGH (ref 0.00–5.00)

## 2018-07-22 MED ORDER — ONDANSETRON HCL 4 MG/2ML IJ SOLN
8.0000 mg | Freq: Once | INTRAMUSCULAR | Status: AC
  Administered 2018-07-22: 8 mg via INTRAVENOUS

## 2018-07-22 MED ORDER — SODIUM CHLORIDE 0.9 % IV SOLN
Freq: Once | INTRAVENOUS | Status: AC
  Administered 2018-07-22: 13:00:00 via INTRAVENOUS
  Filled 2018-07-22: qty 250

## 2018-07-22 MED ORDER — SODIUM CHLORIDE 0.9% FLUSH
10.0000 mL | Freq: Once | INTRAVENOUS | Status: AC
  Administered 2018-07-22: 10 mL
  Filled 2018-07-22: qty 10

## 2018-07-22 MED ORDER — ONDANSETRON HCL 4 MG/2ML IJ SOLN
INTRAMUSCULAR | Status: AC
  Filled 2018-07-22: qty 4

## 2018-07-22 MED ORDER — SODIUM CHLORIDE 0.9 % IV SOLN: Freq: Once | INTRAVENOUS | Status: DC

## 2018-07-22 MED ORDER — HEPARIN SOD (PORK) LOCK FLUSH 100 UNIT/ML IV SOLN
500.0000 [IU] | INTRAVENOUS | Status: AC | PRN
  Administered 2018-07-22: 500 [IU]
  Filled 2018-07-22: qty 5

## 2018-07-22 MED ORDER — DEXAMETHASONE SODIUM PHOSPHATE 10 MG/ML IJ SOLN
INTRAMUSCULAR | Status: AC
  Filled 2018-07-22: qty 1

## 2018-07-22 MED ORDER — ONDANSETRON HCL 4 MG/2ML IJ SOLN: 8.0000 mg | Freq: Once | INTRAMUSCULAR | Status: DC

## 2018-07-22 MED ORDER — SODIUM CHLORIDE 0.9 % IV SOLN: 10.0000 mg | Freq: Once | INTRAVENOUS | Status: DC

## 2018-07-22 MED ORDER — DEXAMETHASONE SODIUM PHOSPHATE 10 MG/ML IJ SOLN: 10.0000 mg | Freq: Once | INTRAMUSCULAR | Status: DC

## 2018-07-22 MED ORDER — SODIUM CHLORIDE 0.9 % IV SOLN
INTRAVENOUS | Status: AC
  Filled 2018-07-22: qty 250

## 2018-07-22 MED ORDER — DEXAMETHASONE SODIUM PHOSPHATE 10 MG/ML IJ SOLN
10.0000 mg | Freq: Once | INTRAMUSCULAR | Status: AC
  Administered 2018-07-22: 10 mg via INTRAVENOUS

## 2018-07-22 MED ORDER — SODIUM CHLORIDE 0.9% FLUSH
10.0000 mL | INTRAVENOUS | Status: AC | PRN
  Administered 2018-07-22: 10 mL
  Filled 2018-07-22: qty 10

## 2018-07-22 NOTE — Progress Notes (Signed)
Dr. Burr Medico has assessed patient today and has decided to hold treatment until his next appointment. Patient is getting fluids instead today.

## 2018-07-22 NOTE — Patient Instructions (Signed)

## 2018-07-28 NOTE — Progress Notes (Signed)
Ryan Collins   Telephone:([phone] Fax:([phone]) [phone]   Clinic Follow up Note   Patient Care Team: Lanae Boast, [place] as PCP - General (Family Medicine)  Date of Service:  07/29/2018  CHIEF COMPLAINT: F/u of gastric cancer  SUMMARY OF ONCOLOGIC HISTORY: Oncology History   Cancer Staging Gastric cancer [facility]) Staging form: Stomach, AJCC 8th Edition - Clinical stage from 06/04/2017: Stage IVB (cT4, cN0, cM1) - Signed by [name], MD on 06/10/2017 - Pathologic stage from 02/25/2018: Stage III (ypT3, pN3a, cM0) - Signed by [name], MD on 04/01/2018       Gastric cancer ([place])   06/03/2017 - 06/06/2017 Hospital Admission    Admitted to the hospital on 06/03/17 with complaints of fatigue and melena. During his stay he underwent an endoscopy that revealed a malignant gastric tumor in the cardia and in the gastric fundus. Biopsy results revealed adenocarcinoma. Pt was discharged on 06/06/17.    06/04/2017 Initial Biopsy    Diagnosis 06/04/17 Stomach, biopsy, Fundus - ADENOCARCINOMA - SEE COMMENT Microscopic Comment The stomach biopsy is of an adenocarcinoma arising in a background of intestinal metaplasia. A Warthin-Starry stain is performed to determine the possibility of the presence of Helicobacter pylori. The Warthin-Starry stain is negative for organisms morphologically consistent with Helicobacter pylori.    06/04/2017 Procedure    Esophagogastroduodenoscopy 06/04/17 by Dr. Benson Norway Impression: Normal esophagus. Malignant gastric tumor in the cardia and in the  gastric fundus. Biopsied. Normal examined duodenum.    06/04/2017 Miscellaneous    HER2 (-) EBV (-) MSI-S PD-L1 CPS 5%    06/05/2017 Imaging    CT CAP W Contrast 06/05/17 IMPRESSION: 9.1 cm fungating mass in the gastric cardia/posterior gastric fundus, corresponding to the patient's newly diagnosed gastric Cancer. Associated extra gastric extension with peritoneal disease in the left upper abdomen,  including a dominant 4.2 cm peritoneal implant. Small volume pelvic ascites. 1.8 cm hypoenhancing lesion along the inferior spleen, indeterminate.    06/10/2017 Initial Diagnosis    Gastric cancer ([place])    06/26/2017 - 01/22/2018 Chemotherapy    First line chemo FOLFOX every 2 weeks     08/29/2017 Imaging    IMPRESSION: 1. Interval decrease in size of fungating ulcerative mass arising from the posterior gastric fundus. 2. Left peritoneal implant, compatible with peritoneal carcinomatosis, has decreased in size from the previous exam. 3. Right lobe of liver lesion present on previous exam is less conspicuous on today's study. A new lesion is identified within the left lobe of liver, suspicious for metastasis. Additional focus of low attenuation within segment 5 adjacent to the gallbladder fossa may represent focal fatty deposition. 4. Similar appearance of mildly complex cyst containing mural calcification and possible internal septation arising from the inferior pole of right kidney. More definitive characterization of this lesion with renal protocol MRI may be helpful. 5.  Mild prostate gland enlargement. 6.  Aortic Atherosclerosis (ICD10-I70.0).    11/25/2017 Imaging    11/25/2017 CT AP W Contrast IMPRESSION: 1. Mixed appearance, with the gastric fundal mass thicker than it was on 08/29/2017 (although still improved compared to 06/05/2017), but with reduced local conglomerate adenopathy along the splenic hilum compared to 08/29/2017. 2. At this time I do not see any definite hepatic metastatic lesions. Several lesions of suspicion in the left hepatic lobe and inferiorly in the right hepatic lobe are not readily visible today. There is a small focus of hypodensity adjacent to the gallbladder fossa in segment 7 which is technically nonspecific but  which could reflect focal fatty infiltration. 3. Increase in nodular contour of scarring in the left lower lobe. This is probably incidental but  I cannot completely exclude the possibility of a localized metastatic lesion. Surveillance of the left lower lobe suggested. 4. Bosniak category 49F cyst of the right kidney lower pole with internal septation and calcification. Surveillance of this lesion is suggested; I suspect that the patient's cancer surveillance will supersede the typical recommended surveillance schedule for complex cyst. 5. Other imaging findings of potential clinical significance: Aortic Atherosclerosis (ICD10-I70.0). Degenerative glenohumeral arthropathy bilaterally. Mild impingement at L4-5 and L5-S1.    12/23/2017 Imaging    12/23/2017 MRI Abdomen IMPRESSION: Ill-defined soft tissue mass in the gastric fundus shows no significant change, consistent known gastric adenocarcinoma.  No definite liver metastases identified. Hemosiderosis noted, with focal area of sparing seen in the right hepatic lobe which accounts for the lesions seen on recent CT.  Stable 1.9 cm probably benign Bosniak category 2 F cystic lesion in right kidney. Recommend continued attention on follow-up imaging.    02/12/2018 Imaging    02/12/2018 CT CAP IMPRESSION: 1. Roughly similar appearance of the mass involving the gastric cardia/fundus. 2. Hepatic steatosis with some areas of fatty sparing as shown on MRI. No definite focal metastatic lesion to the liver is identified. 3. Nonspecific 0.8 cm lymph node between the tail the pancreas in the splenic hilum. There is also a mildly enlarged but stable right hilar lymph node at 1.0 cm in diameter, significance uncertain. 4. Overall stable appearance of the 1.9 cm Bosniak category 49F cyst of the right kidney lower pole. We have now documented 9 months of relative stability. Surveillance at or before 6 months time is recommended. 5. Other imaging findings of potential clinical significance: Aortic Atherosclerosis (ICD10-I70.0). Small anterior pericardial effusion. Stable scarring in the  left lower lobe. Medial hypoenhancement of the upper spleen is probably from early phase of contrast, less likely to be splenic infarct given the relatively normal appearance on delayed images. Impingement at L4-5 and L5-S1.    02/25/2018 Surgery    On 02/25/2018, he had a diagnostic laparoscopy, liver biopsy, total gastrectomy with esophagojejunostomy en bloc with distal pancreatectomy/splenectomy, feeding jejunostomy tube with Dr. Barry Dienes.     02/25/2018 Pathology Results    02/25/2018 Surgical Pathology Diagnosis 1. Soft tissue, biopsy, Diaphragmatic Nodule - NEGATIVE FOR CARCINOMA. 2. Liver, biopsy - LIVER PARENCHYMA, NEGATIVE FOR CARCINOMA. 3. Liver, biopsy, Right - LIVER PARENCHYMA WITH CAPSULAR FIBROSIS. - NEGATIVE FOR CARCINOMA. 4. Soft tissue, biopsy, Falciform Nodule - LIVER PARENCHYMA WITH FIBROTIC BANDS. SEE NOTE. - NEGATIVE FOR CARCINOMA. 5. Omentum, resection for tumor - BENIGN PERITONEUM-LINED ADIPOSE TISSUE. - NEGATIVE FOR CARCINOMA. 6. Stomach, resection for tumor, distal pancreas and spleen - INVASIVE MODERATELY TO POORLY DIFFERENTIATED GASTRIC CARCINOMA, 7 CM. - CARCINOMA INVADES INTO PERIGASTRIC SOFT TISSUE AND IS ADHERENT TO PANCREAS AND SPLEEN BUT DOES NOT INVADE INTO PANCREATIC OR SPLENIC PARENCHYMA. - NO EVIDENCE OF LYMPHOVASCULAR OR PERINEURAL INVASION. - MINIMAL THERAPEUTIC RESPONSE (TUMOR REGRESSION SCORE 3). - METASTATIC CARCINOMA TO EIGHT OF THIRTY-SEVEN LYMPH NODES (8/37). - ALL RESECTION MARGINS ARE NEGATIVE FOR CARCINOMA. - BENIGN UNREMARKABLE PANCREAS. - SEE ONCOLOGY TABLE.    02/25/2018 Cancer Staging    Staging form: Stomach, AJCC 8th Edition - Pathologic stage from 02/25/2018: Stage III (ypT3, pN3a, cM0) - Signed by [name], MD on 04/01/2018    06/05/2018 Imaging    CT CAP W COntrast 06/05/18  IMPRESSION: 1. Interval gastrectomy with esophagojejunostomy in the  lower chest. Splenectomy and partial pancreatectomy. 2. At least 35 new  pulmonary nodules are present, measuring up to 1.5 cm in diameter, high suspicion for metastatic disease. 3. Portacaval and porta hepatis adenopathy, suspicious for malignancy. 4. Although I do not see a definite liver tumor, sensitivity for small lesions is mildly adversely affected due to the late arterial phase of contrast administration. 5. New thoracic spine compression fractures at T6, T7, T2, T3, and T4 as detailed above. New lower sternal manubrial fracture. 6. Right inguinal hernia containing a loop of small bowel, no overt obstruction or strangulation currently evident. 7. Diffuse mild subcutaneous and mesenteric edema, cause uncertain. 8. Other imaging findings of potential clinical significance: Aortic Atherosclerosis (ICD10-I70.0). Lower lumbar spondylosis and degenerative disc disease.    06/16/2018 -  Chemotherapy    On second line chemo weekly taxol andCyramzastarting 06/16/2018      CURRENT THERAPY:  On second line chemo weekly taxol andCyramzastarting 06/16/2018, add on carboplatin AUC 1.5 weekly on 07/29/2018  INTERVAL HISTORY:  Ryan Collins is here for a follow up and treatment. He presents to the clinic today by himself. He notes he feels fair. He tries to eat to not as much, overall still has low appetite. He tries nutritional supplment but does not take it often as it causes him gas.  He has diarrhea occasaionlly, last few days he had normal bowel movement. He notes the back pain ia better and no longer has abodminal pain. He notes occasional epistaxis but no other signs of bleeding.     REVIEW OF SYSTEMS:   Constitutional: Denies fevers, chills (+) Lower appetite, weight loss  Eyes: Denies blurriness of vision Ears, nose, mouth, throat, and face: Denies mucositis or sore throat (+) Occasional epistaxis  Respiratory: Denies cough, dyspnea or wheezes Cardiovascular: Denies palpitation, chest discomfort or lower extremity swelling Gastrointestinal:  Denies  nausea, heartburn or change in bowel habits Skin: Denies abnormal skin rashes Lymphatics: Denies new lymphadenopathy or easy bruising Neurological:Denies numbness, tingling or new weaknesses Behavioral/Psych: Mood is stable, no new changes  All other systems were reviewed with the patient and are negative.  MEDICAL HISTORY:  Past Medical History:  Diagnosis Date   Cancer [facility])    gastric cancer   DVT (deep venous thrombosis) ([place]) 2011   GI bleeding 2001   History of DVT of lower extremity    Hypertension    Melena 05/2017   Stomach ulcer    Clinically suspected; No EGD confirmation as of 06/03/17    SURGICAL HISTORY: Past Surgical History:  Procedure Laterality Date   ESOPHAGOGASTRODUODENOSCOPY  06/04/2017   ESOPHAGOGASTRODUODENOSCOPY N/A 06/04/2017   Procedure: ESOPHAGOGASTRODUODENOSCOPY (EGD);  Surgeon: Carol Ada, MD;  Location: [place];  Service: Endoscopy;  Laterality: N/A;   GASTRECTOMY N/A 02/25/2018   Procedure: TOTAL GASTRECTOMY WITH J TUBE PLACEMENT;  Surgeon: [name], MD;  Location: [place];  Service: General;  Laterality: N/A;   IR FLUORO GUIDE PORT INSERTION RIGHT  06/20/2017   IR US GUIDE VASC ACCESS RIGHT  06/20/2017   JEJUNOSTOMY N/A 02/25/2018   Procedure: JEJUNOSTOMY TUBE;  Surgeon: [name], MD;  Location: [place];  Service: General;  Laterality: N/A;   LAPAROSCOPY N/A 02/25/2018   Procedure: LAPAROSCOPY DIAGNOSTIC;  Surgeon: [name], MD;  Location: [place];  Service: General;  Laterality: N/A;   SPLENECTOMY, TOTAL N/A 02/25/2018   Procedure: SPLENECTOMY;  Surgeon: [name], MD;  Location: [place];  Service: General;  Laterality: N/A;    I have reviewed  the social history and family history with the patient and they are unchanged from previous note.  ALLERGIES:  has No Known Allergies.  MEDICATIONS:  Current Outpatient Medications  Medication Sig Dispense Refill   acetaminophen (TYLENOL) 500 MG tablet Take 1,000 mg by  mouth every 6 (six) hours as needed for mild pain.      Amino Acids-Protein Hydrolys (FEEDING SUPPLEMENT, PRO-STAT SUGAR FREE 64,) LIQD Place 30 mLs into feeding tube 3 (three) times daily. 900 mL 0   amLODipine (NORVASC) 2.5 MG tablet Take 1 tablet (2.5 mg total) by mouth daily. 90 tablet 3   cyclobenzaprine (FLEXERIL) 10 MG tablet Take 1 tablet (10 mg total) by mouth 3 (three) times daily as needed for muscle spasms. 30 tablet 1   lidocaine-prilocaine (EMLA) cream Apply 1 application topically as needed (for port). 30 g 2   magic mouthwash w/lidocaine SOLN Take 5 mLs by mouth 4 (four) times daily as needed for mouth pain. 240 mL 2   ondansetron (ZOFRAN) 8 MG tablet Take 8 mg by mouth every 8 (eight) hours as needed.     prochlorperazine (COMPAZINE) 10 MG tablet Take 10 mg by mouth every 6 (six) hours as needed.     vitamin B-12 (CYANOCOBALAMIN) 1000 MCG tablet Take 1 tablet (1,000 mcg total) by mouth daily. 30 tablet 0   No current facility-administered medications for this visit.     PHYSICAL EXAMINATION: ECOG PERFORMANCE STATUS: 2 - Symptomatic, <50% confined to bed  Vitals:   07/29/18 1208  BP: 125/87  Pulse: 71  Resp: 18  Temp: (!) 97.5 F (36.4 C)  SpO2: 97%   Filed Weights   07/29/18 1208  Weight: 138 lb 3.2 oz (62.7 kg)    GENERAL:alert, no distress and comfortable SKIN: skin color, texture, turgor are normal, no rashes or significant lesions EYES: normal, Conjunctiva are pink and non-injected, sclera clear OROPHARYNX:no exudate, no erythema and lips, buccal mucosa, and tongue normal  NECK: supple, thyroid normal size, non-tender, without nodularity LYMPH:  no palpable lymphadenopathy in the cervical, axillary or inguinal LUNGS: clear to auscultation and percussion with normal breathing effort HEART: regular rate & rhythm and no murmurs and no lower extremity edema ABDOMEN:abdomen soft, non-tender and normal bowel sounds Musculoskeletal:no cyanosis of digits  and no clubbing  NEURO: alert & oriented x 3 with fluent speech, no focal motor/sensory deficits  LABORATORY DATA:  I have reviewed the data as listed CBC Latest Ref Rng & Units 07/29/2018 07/22/2018 07/14/2018  WBC 4.0 - 10.5 K/uL 5.9 7.0 4.5  Hemoglobin 13.0 - 17.0 g/dL 11.0(L) 12.2(L) 10.9(L)  Hematocrit 39.0 - 52.0 % 32.8(L) 35.4(L) 32.0(L)  Platelets 150 - 400 K/uL 557(H) 467(H) 514(H)     CMP Latest Ref Rng & Units 07/29/2018 07/22/2018 07/14/2018  Glucose 70 - 99 mg/dL 96 116(H) 81  BUN 6 - 20 mg/dL 5(L) 17 <4(L)  Creatinine 0.61 - 1.24 mg/dL 0.70 1.03 0.68  Sodium 135 - 145 mmol/L 142 136 139  Potassium 3.5 - 5.1 mmol/L 3.1(L) 3.6 3.5  Chloride 98 - 111 mmol/L 107 96(L) 105  CO2 22 - 32 mmol/L _0 Calcium 8.9 - 10.3 mg/dL 8.0(L) 8.8(L) 8.0(L)  Total Protein 6.5 - 8.1 g/dL 5.8(L) 7.2 6.0(L)  Total Bilirubin 0.3 - 1.2 mg/dL 0.4 0.8 0.4  Alkaline Phos 38 - 126 U/L 59 71 76  AST 15 - 41 U/L _1 ALT 0 - 44 U/L 10 11 7  RADIOGRAPHIC STUDIES: I have personally reviewed the radiological images as listed and agreed with the findings in the report. No results found.   ASSESSMENT & PLAN:  Ryan Collins is a 58 y.o. male with   1. Gastric cancer, T4bNxM1 with presumedperitoneal metastasis, adenocarcinoma, HER2(-), MSI-stable, PD-L1positive (5%), ypT3N3aM0, lung metastasis in 05/2018 -He was diagnosed in 05/2017.Initial staging scan showed a large mass in LUQ,presumably peritoneal metastasis. He underwent several months of FOLFOX, followed bytotal gastrectomywith esophagojejunostomy en bloc with distal pancreatectomy/splenectomywith J-tube in placein 02/2018 -Due to his prolonged neoadjuvant chemotherapywith FOLOX, I do not think he would benefit more from adjuvant chemo.Due to the D2 resection, adjuvant radiation wasalso not recommended -Unfortunately he developed lung metastasis in Jan 2020 -I have started him on second line chemo weekly taxol  andCyramzastarting 06/16/2018. He has been tolerating moderate well, his back pain has resolved -He is clinically not doing very well in the past two week, lost more weight, more fatigued, chemo held last week.  -I disucssed adding carboplatin AUC 1.5 to current regimen. If not tolerating treatment well we will dose reduce as needed.  -Labs revewied, urine protein and CBC WNL except Hg at 11, PLT at 557K, CMP WNL except K3.1. Overall adeqaute to proceed with C3D1 Taxol, carbo and Cyramza today  -F/u in 2 weeks   2. Anemia of iron deficiency and tumor bleeding, B12 deficiency -He has clinical GI bleeding with melena, iron study also showed iron deficiency. -He has received blood transfusion, and3dose of IV Feraheme previously, responded well. -takesOral-B complex  -Hg stable at 11 today (07/29/18)  3. History of DVT, ? Protein C deficiency -He was on coumadin for his DVT before, and previous labs showed a protein C deficiency, which increases his risk of thrombosis. -We previously discussed the high risk of thrombosis secondary to his underlying malignancy. -he is not on anyphylactic anticoagulation.  4. History of alcohol and marijuana abuse -He has quit drinking alcohol since his diagnosis of cancer -Hestilldoes smoke marijuana occasionally  5. Weight loss and malnutrition  -He has lost significant weight since the diagnosisand surgery -His J-tube fell outincidentally in December 2019 -Will continue to f/uwith ourdietitian, last seen 2 weeks ago.  -He has lost 12 pounds since last visit. I again encourage him to try nutritional supplement   6. Peripheral neuropathy, grade 1, second to chemotherapy oxaliplatin -He has neuropathy on his fingers and toes, stable.   7. Abdominal pain  -Secondary to surgery  -He completed his prescription of oxycodone given by Dr. Barry Dienes.  -Due to his previous substance abuse,I will be cautious about narcotics -takes flexeril,Tylenol  and Alkazaltzer. -Currently resolved.   8. Thoracic compression fracture -I have discussed histhoracicspineMRIfindings with pt, no evidence of bone mets  -continue calcium and Vitd -may considerZometa infusionevery 6 months -his pain resolved.   9. HTN -His Amlodipine was recently dose reduced to 2.'5mg'$  daily according to patient. -I discussed his chemo can increase BP, will monitor. May increase amlodipine again if uncontrolled.  -Bp normal today   10. Hypokalemia -His potassium 3.1 today, he is not on supplement. -We will give oral potassium chloride 40 mill equal in the infusion today, and I called in potassium chloride 20 mEq twice daily  Plan -Labs reviewed and adequate to proceed with C3D1 Taxol and Cyramza today, and will add Carboplatin AUC 1.5 to the regimen  -f/u in 2 weeks    No problem-specific Assessment & Plan notes found for this encounter.   No orders of the  defined types were placed in this encounter.  All questions were answered. The patient knows to call the clinic with any problems, questions or concerns. No barriers to learning was detected. I spent 20 minutes counseling the patient face to face. The total time spent in the appointment was 25 minutes and more than 50% was on counseling and review of test results     [name], MD 07/29/2018   I, Joslyn Devon, am acting as scribe for [name], MD.   I have reviewed the above documentation for accuracy and completeness, and I agree with the above.

## 2018-07-29 ENCOUNTER — Inpatient Hospital Stay: Payer: Medicaid Other

## 2018-07-29 ENCOUNTER — Encounter: Payer: Self-pay | Admitting: Hematology

## 2018-07-29 ENCOUNTER — Telehealth: Payer: Self-pay | Admitting: Hematology

## 2018-07-29 ENCOUNTER — Other Ambulatory Visit: Payer: Self-pay

## 2018-07-29 ENCOUNTER — Inpatient Hospital Stay (HOSPITAL_BASED_OUTPATIENT_CLINIC_OR_DEPARTMENT_OTHER): Payer: Medicaid Other | Admitting: Hematology

## 2018-07-29 VITALS — BP 125/87 | HR 71 | Temp 97.5°F | Resp 18 | Ht 71.0 in | Wt 138.2 lb

## 2018-07-29 DIAGNOSIS — E46 Unspecified protein-calorie malnutrition: Secondary | ICD-10-CM

## 2018-07-29 DIAGNOSIS — C786 Secondary malignant neoplasm of retroperitoneum and peritoneum: Secondary | ICD-10-CM

## 2018-07-29 DIAGNOSIS — C161 Malignant neoplasm of fundus of stomach: Secondary | ICD-10-CM

## 2018-07-29 DIAGNOSIS — D509 Iron deficiency anemia, unspecified: Secondary | ICD-10-CM

## 2018-07-29 DIAGNOSIS — C7800 Secondary malignant neoplasm of unspecified lung: Secondary | ICD-10-CM

## 2018-07-29 DIAGNOSIS — I10 Essential (primary) hypertension: Secondary | ICD-10-CM

## 2018-07-29 DIAGNOSIS — E876 Hypokalemia: Secondary | ICD-10-CM | POA: Diagnosis not present

## 2018-07-29 DIAGNOSIS — Z934 Other artificial openings of gastrointestinal tract status: Secondary | ICD-10-CM

## 2018-07-29 DIAGNOSIS — E538 Deficiency of other specified B group vitamins: Secondary | ICD-10-CM

## 2018-07-29 DIAGNOSIS — Z79899 Other long term (current) drug therapy: Secondary | ICD-10-CM

## 2018-07-29 DIAGNOSIS — Z903 Acquired absence of stomach [part of]: Secondary | ICD-10-CM

## 2018-07-29 DIAGNOSIS — F1010 Alcohol abuse, uncomplicated: Secondary | ICD-10-CM

## 2018-07-29 DIAGNOSIS — Z86718 Personal history of other venous thrombosis and embolism: Secondary | ICD-10-CM

## 2018-07-29 DIAGNOSIS — D500 Iron deficiency anemia secondary to blood loss (chronic): Secondary | ICD-10-CM

## 2018-07-29 DIAGNOSIS — Z7901 Long term (current) use of anticoagulants: Secondary | ICD-10-CM

## 2018-07-29 DIAGNOSIS — Z980 Intestinal bypass and anastomosis status: Secondary | ICD-10-CM

## 2018-07-29 DIAGNOSIS — F1210 Cannabis abuse, uncomplicated: Secondary | ICD-10-CM

## 2018-07-29 DIAGNOSIS — Z5112 Encounter for antineoplastic immunotherapy: Secondary | ICD-10-CM | POA: Diagnosis not present

## 2018-07-29 DIAGNOSIS — G629 Polyneuropathy, unspecified: Secondary | ICD-10-CM

## 2018-07-29 DIAGNOSIS — M4854XA Collapsed vertebra, not elsewhere classified, thoracic region, initial encounter for fracture: Secondary | ICD-10-CM

## 2018-07-29 DIAGNOSIS — Z95828 Presence of other vascular implants and grafts: Secondary | ICD-10-CM

## 2018-07-29 LAB — CBC WITH DIFFERENTIAL (CANCER CENTER ONLY)
Abs Immature Granulocytes: 0.02 10*3/uL (ref 0.00–0.07)
BASOS PCT: 0 %
Basophils Absolute: 0 10*3/uL (ref 0.0–0.1)
Eosinophils Absolute: 0 10*3/uL (ref 0.0–0.5)
Eosinophils Relative: 0 %
HCT: 32.8 % — ABNORMAL LOW (ref 39.0–52.0)
Hemoglobin: 11 g/dL — ABNORMAL LOW (ref 13.0–17.0)
Immature Granulocytes: 0 %
LYMPHS PCT: 51 %
Lymphs Abs: 3 10*3/uL (ref 0.7–4.0)
MCH: 28.8 pg (ref 26.0–34.0)
MCHC: 33.5 g/dL (ref 30.0–36.0)
MCV: 85.9 fL (ref 80.0–100.0)
Monocytes Absolute: 0.9 10*3/uL (ref 0.1–1.0)
Monocytes Relative: 16 %
Neutro Abs: 1.9 10*3/uL (ref 1.7–7.7)
Neutrophils Relative %: 33 %
Platelet Count: 557 10*3/uL — ABNORMAL HIGH (ref 150–400)
RBC: 3.82 MIL/uL — ABNORMAL LOW (ref 4.22–5.81)
RDW: 21 % — ABNORMAL HIGH (ref 11.5–15.5)
WBC: 5.9 10*3/uL (ref 4.0–10.5)
nRBC: 0.3 % — ABNORMAL HIGH (ref 0.0–0.2)

## 2018-07-29 LAB — CMP (CANCER CENTER ONLY)
ALT: 10 U/L (ref 0–44)
AST: 15 U/L (ref 15–41)
Albumin: 3.1 g/dL — ABNORMAL LOW (ref 3.5–5.0)
Alkaline Phosphatase: 59 U/L (ref 38–126)
Anion gap: 10 (ref 5–15)
BUN: 5 mg/dL — AB (ref 6–20)
CHLORIDE: 107 mmol/L (ref 98–111)
CO2: 25 mmol/L (ref 22–32)
Calcium: 8 mg/dL — ABNORMAL LOW (ref 8.9–10.3)
Creatinine: 0.7 mg/dL (ref 0.61–1.24)
GFR, Est AFR Am: >60 mL/min (ref >60)
GFR, Estimated: >60 mL/min (ref >60)
Glucose, Bld: 96 mg/dL (ref 70–99)
Potassium: 3.1 mmol/L — ABNORMAL LOW (ref 3.5–5.1)
Sodium: 142 mmol/L (ref 135–145)
Total Bilirubin: 0.4 mg/dL (ref 0.3–1.2)
Total Protein: 5.8 g/dL — ABNORMAL LOW (ref 6.5–8.1)

## 2018-07-29 LAB — TOTAL PROTEIN, URINE DIPSTICK: Protein, ur: NEGATIVE mg/dL

## 2018-07-29 MED ORDER — SODIUM CHLORIDE 0.9 % IV SOLN
191.4000 mg | Freq: Once | INTRAVENOUS | Status: AC
  Administered 2018-07-29: 190 mg via INTRAVENOUS
  Filled 2018-07-29: qty 19

## 2018-07-29 MED ORDER — POTASSIUM CHLORIDE CRYS ER 20 MEQ PO TBCR
EXTENDED_RELEASE_TABLET | ORAL | Status: AC
  Filled 2018-07-29: qty 2

## 2018-07-29 MED ORDER — POTASSIUM CHLORIDE CRYS ER 20 MEQ PO TBCR: 20.0000 meq | EXTENDED_RELEASE_TABLET | Freq: Two times a day (BID) | ORAL | 2 refills | Status: AC

## 2018-07-29 MED ORDER — FAMOTIDINE IN NACL 20-0.9 MG/50ML-% IV SOLN: 20.0000 mg | Freq: Once | INTRAVENOUS | Status: DC

## 2018-07-29 MED ORDER — DEXAMETHASONE SODIUM PHOSPHATE 10 MG/ML IJ SOLN
5.0000 mg | Freq: Once | INTRAMUSCULAR | Status: AC
  Administered 2018-07-29: 5 mg via INTRAVENOUS

## 2018-07-29 MED ORDER — SODIUM CHLORIDE 0.9 % IV SOLN
20.0000 mg | Freq: Once | INTRAVENOUS | Status: AC
  Administered 2018-07-29: 20 mg via INTRAVENOUS
  Filled 2018-07-29: qty 2

## 2018-07-29 MED ORDER — SODIUM CHLORIDE 0.9% FLUSH
10.0000 mL | INTRAVENOUS | Status: DC | PRN
  Administered 2018-07-29: 10 mL
  Filled 2018-07-29: qty 10

## 2018-07-29 MED ORDER — SODIUM CHLORIDE 0.9% FLUSH
10.0000 mL | Freq: Once | INTRAVENOUS | Status: AC
  Administered 2018-07-29: 10 mL
  Filled 2018-07-29: qty 10

## 2018-07-29 MED ORDER — SODIUM CHLORIDE 0.9 % IV SOLN
8.0000 mg/kg | Freq: Once | INTRAVENOUS | Status: AC
  Administered 2018-07-29: 600 mg via INTRAVENOUS
  Filled 2018-07-29: qty 50

## 2018-07-29 MED ORDER — SODIUM CHLORIDE 0.9 % IV SOLN
Freq: Once | INTRAVENOUS | Status: AC
  Administered 2018-07-29: 13:00:00 via INTRAVENOUS
  Filled 2018-07-29: qty 250

## 2018-07-29 MED ORDER — PALONOSETRON HCL INJECTION 0.25 MG/5ML
0.2500 mg | Freq: Once | INTRAVENOUS | Status: AC
  Administered 2018-07-29: 0.25 mg via INTRAVENOUS

## 2018-07-29 MED ORDER — LIDOCAINE-PRILOCAINE 2.5-2.5 % EX CREA: 1.0000 "application " | TOPICAL_CREAM | CUTANEOUS | 2 refills | Status: AC | PRN

## 2018-07-29 MED ORDER — ACETAMINOPHEN 325 MG PO TABS
ORAL_TABLET | ORAL | Status: AC
  Filled 2018-07-29: qty 2

## 2018-07-29 MED ORDER — HEPARIN SOD (PORK) LOCK FLUSH 100 UNIT/ML IV SOLN
500.0000 [IU] | Freq: Once | INTRAVENOUS | Status: AC | PRN
  Administered 2018-07-29: 500 [IU]
  Filled 2018-07-29: qty 5

## 2018-07-29 MED ORDER — PALONOSETRON HCL INJECTION 0.25 MG/5ML
INTRAVENOUS | Status: AC
  Filled 2018-07-29: qty 5

## 2018-07-29 MED ORDER — SODIUM CHLORIDE 0.9 % IV SOLN
80.0000 mg/m2 | Freq: Once | INTRAVENOUS | Status: AC
  Administered 2018-07-29: 150 mg via INTRAVENOUS
  Filled 2018-07-29: qty 25

## 2018-07-29 MED ORDER — DIPHENHYDRAMINE HCL 50 MG/ML IJ SOLN
INTRAMUSCULAR | Status: AC
  Filled 2018-07-29: qty 1

## 2018-07-29 MED ORDER — DEXAMETHASONE SODIUM PHOSPHATE 10 MG/ML IJ SOLN
INTRAMUSCULAR | Status: AC
  Filled 2018-07-29: qty 1

## 2018-07-29 MED ORDER — DIPHENHYDRAMINE HCL 50 MG/ML IJ SOLN
50.0000 mg | Freq: Once | INTRAMUSCULAR | Status: AC
  Administered 2018-07-29: 50 mg via INTRAVENOUS

## 2018-07-29 MED ORDER — POTASSIUM CHLORIDE CRYS ER 20 MEQ PO TBCR
40.0000 meq | EXTENDED_RELEASE_TABLET | Freq: Two times a day (BID) | ORAL | Status: DC
  Administered 2018-07-29: 40 meq via ORAL

## 2018-07-29 MED ORDER — ACETAMINOPHEN 325 MG PO TABS
650.0000 mg | ORAL_TABLET | Freq: Once | ORAL | Status: AC
  Administered 2018-07-29: 650 mg via ORAL

## 2018-07-29 NOTE — Patient Instructions (Addendum)
[facility] Discharge Instructions for Patients Receiving Chemotherapy  Today you received the following chemotherapy agents Cyramza, Taxol and Carboplatin  To help prevent nausea and vomiting after your treatment, we encourage you to take your nausea medication as directed.   If you develop nausea and vomiting that is not controlled by your nausea medication, call the clinic.   BELOW ARE SYMPTOMS THAT SHOULD BE REPORTED IMMEDIATELY:  *FEVER GREATER THAN 100.5 F  *CHILLS WITH OR WITHOUT FEVER  NAUSEA AND VOMITING THAT IS NOT CONTROLLED WITH YOUR NAUSEA MEDICATION  *UNUSUAL SHORTNESS OF BREATH  *UNUSUAL BRUISING OR BLEEDING  TENDERNESS IN MOUTH AND THROAT WITH OR WITHOUT PRESENCE OF ULCERS  *URINARY PROBLEMS  *BOWEL PROBLEMS  UNUSUAL RASH Items with * indicate a potential emergency and should be followed up as soon as possible.  Feel free to call the clinic you have any questions or concerns. The clinic phone number is [phone].   Carboplatin injection What is this medicine? CARBOPLATIN (KAR boe pla tin) is a chemotherapy drug. It targets fast dividing cells, like cancer cells, and causes these cells to die. This medicine is used to treat ovarian cancer and many other cancers. This medicine may be used for other purposes; ask your health care provider or pharmacist if you have questions. COMMON BRAND NAME(S): Paraplatin What should I tell my health care provider before I take this medicine? They need to know if you have any of these conditions: -blood disorders -hearing problems -kidney disease -recent or ongoing radiation therapy -an unusual or allergic reaction to carboplatin, cisplatin, other chemotherapy, other medicines, foods, dyes, or preservatives -pregnant or trying to get pregnant -breast-feeding How should I use this medicine? This drug is usually given as an infusion into a vein. It is administered in a hospital or clinic by a specially  trained health care professional. Talk to your pediatrician regarding the use of this medicine in children. Special care may be needed. Overdosage: If you think you have taken too much of this medicine contact a poison control center or emergency room at once. NOTE: This medicine is only for you. Do not share this medicine with others. What if I miss a dose? It is important not to miss a dose. Call your doctor or health care professional if you are unable to keep an appointment. What may interact with this medicine? -medicines for seizures -medicines to increase blood counts like filgrastim, pegfilgrastim, sargramostim -some antibiotics like amikacin, gentamicin, neomycin, streptomycin, tobramycin -vaccines Talk to your doctor or health care professional before taking any of these medicines: -acetaminophen -aspirin -ibuprofen -ketoprofen -naproxen This list may not describe all possible interactions. Give your health care provider a list of all the medicines, herbs, non-prescription drugs, or dietary supplements you use. Also tell them if you smoke, drink alcohol, or use illegal drugs. Some items may interact with your medicine. What should I watch for while using this medicine? Your condition will be monitored carefully while you are receiving this medicine. You will need important blood work done while you are taking this medicine. This drug may make you feel generally unwell. This is not uncommon, as chemotherapy can affect healthy cells as well as cancer cells. Report any side effects. Continue your course of treatment even though you feel ill unless your doctor tells you to stop. In some cases, you may be given additional medicines to help with side effects. Follow all directions for their use. Call your doctor or health care professional for advice  if you get a fever, chills or sore throat, or other symptoms of a cold or flu. Do not treat yourself. This drug decreases your body's ability  to fight infections. Try to avoid being around people who are sick. This medicine may increase your risk to bruise or bleed. Call your doctor or health care professional if you notice any unusual bleeding. Be careful brushing and flossing your teeth or using a toothpick because you may get an infection or bleed more easily. If you have any dental work done, tell your dentist you are receiving this medicine. Avoid taking products that contain aspirin, acetaminophen, ibuprofen, naproxen, or ketoprofen unless instructed by your doctor. These medicines may hide a fever. Do not become pregnant while taking this medicine. Women should inform their doctor if they wish to become pregnant or think they might be pregnant. There is a potential for serious side effects to an unborn child. Talk to your health care professional or pharmacist for more information. Do not breast-feed an infant while taking this medicine. What side effects may I notice from receiving this medicine? Side effects that you should report to your doctor or health care professional as soon as possible: -allergic reactions like skin rash, itching or hives, swelling of the face, lips, or tongue -signs of infection - fever or chills, cough, sore throat, pain or difficulty passing urine -signs of decreased platelets or bleeding - bruising, pinpoint red spots on the skin, black, tarry stools, nosebleeds -signs of decreased red blood cells - unusually weak or tired, fainting spells, lightheadedness -breathing problems -changes in hearing -changes in vision -chest pain -high blood pressure -low blood counts - This drug may decrease the number of white blood cells, red blood cells and platelets. You may be at increased risk for infections and bleeding. -nausea and vomiting -pain, swelling, redness or irritation at the injection site -pain, tingling, numbness in the hands or feet -problems with balance, talking, walking -trouble passing urine  or change in the amount of urine Side effects that usually do not require medical attention (report to your doctor or health care professional if they continue or are bothersome): -hair loss -loss of appetite -metallic taste in the mouth or changes in taste This list may not describe all possible side effects. Call your doctor for medical advice about side effects. You may report side effects to FDA at 1-800-FDA-1088. Where should I keep my medicine? This drug is given in a hospital or clinic and will not be stored at home. NOTE: This sheet is a summary. It may not cover all possible information. If you have questions about this medicine, talk to your doctor, pharmacist, or health care provider.  2019 Elsevier/[employer] Standard (2007-08-05 14:38:05)   Hypokalemia Hypokalemia means that the amount of potassium in the blood is lower than normal.Potassium is a chemical that helps regulate the amount of fluid in the body (electrolyte). It also stimulates muscle tightening (contraction) and helps nerves work properly.Normally, most of the body's potassium is inside of cells, and only a very small amount is in the blood. Because the amount in the blood is so small, minor changes to potassium levels in the blood can be life-threatening. What are the causes? This condition may be caused by:  Antibiotic medicine.  Diarrhea or vomiting. Taking too much of a medicine that helps you have a bowel movement (laxative) can cause diarrhea and lead to hypokalemia.  Chronic kidney disease (CKD).  Medicines that help the body get rid of excess  fluid (diuretics).  Eating disorders, such as bulimia.  Low magnesium levels in the body.  Sweating a lot. What are the signs or symptoms? Symptoms of this condition include:  Weakness.  Constipation.  Fatigue.  Muscle cramps.  Mental confusion.  Skipped heartbeats or irregular heartbeat (palpitations).  Tingling or numbness. How is this  diagnosed? This condition is diagnosed with a blood test. How is this treated? Hypokalemia can be treated by taking potassium supplements by mouth or adjusting the medicines that you take. Treatment may also include eating more foods that contain a lot of potassium. If your potassium level is very low, you may need to get potassium through an IV tube in one of your veins and be monitored in the hospital. Follow these instructions at home:   Take over-the-counter and prescription medicines only as told by your health care provider. This includes vitamins and supplements.  Eat a healthy diet. A healthy diet includes fresh fruits and vegetables, whole grains, healthy fats, and lean proteins.  If instructed, eat more foods that contain a lot of potassium, such as: ? Nuts, such as peanuts and pistachios. ? Seeds, such as sunflower seeds and pumpkin seeds. ? Peas, lentils, and lima beans. ? Whole grain and bran cereals and breads. ? Fresh fruits and vegetables, such as apricots, avocado, bananas, cantaloupe, kiwi, oranges, tomatoes, asparagus, and potatoes. ? Orange juice. ? Tomato juice. ? Red meats. ? Yogurt.  Keep all follow-up visits as told by your health care provider. This is important. Contact a health care provider if:  You have weakness that gets worse.  You feel your heart pounding or racing.  You vomit.  You have diarrhea.  You have diabetes (diabetes mellitus) and you have trouble keeping your blood sugar (glucose) in your target range. Get help right away if:  You have chest pain.  You have shortness of breath.  You have vomiting or diarrhea that lasts for more than 2 days.  You faint. This information is not intended to replace advice given to you by your health care provider. Make sure you discuss any questions you have with your health care provider. Document Released: 04/30/2005 Document Revised: 12/17/2015 Document Reviewed: 12/17/2015 Elsevier Interactive  Patient Education  2019 [employer].

## 2018-07-29 NOTE — Telephone Encounter (Signed)
No los per 3/17. °

## 2018-07-31 ENCOUNTER — Ambulatory Visit: Payer: Medicaid Other

## 2018-08-07 NOTE — Progress Notes (Signed)
[facility]   [phone] [phone]) [phone]   Clinic Follow up Note   Patient Care Team: Lanae Boast, [place] as PCP - General (Family Medicine)  Date of Service:  08/11/2018  CHIEF COMPLAINT: F/u of gastric cancer  SUMMARY OF ONCOLOGIC HISTORY: Oncology History   Cancer Staging Gastric cancer [facility]) Staging form: Stomach, AJCC 8th Edition - Clinical stage from 06/04/2017: Stage IVB (cT4, cN0, cM1) - Signed by [name], MD on 06/10/2017 - Pathologic stage from 02/25/2018: Stage III (ypT3, pN3a, cM0) - Signed by [name], MD on 04/01/2018       Gastric cancer ([place])   06/03/2017 - 06/06/2017 Hospital Admission    Admitted to the hospital on 06/03/17 with complaints of fatigue and melena. During his stay he underwent an endoscopy that revealed a malignant gastric tumor in the cardia and in the gastric fundus. Biopsy results revealed adenocarcinoma. Pt was discharged on 06/06/17.    06/04/2017 Initial Biopsy    Diagnosis 06/04/17 Stomach, biopsy, Fundus - ADENOCARCINOMA - SEE COMMENT Microscopic Comment The stomach biopsy is of an adenocarcinoma arising in a background of intestinal metaplasia. A Warthin-Starry stain is performed to determine the possibility of the presence of Helicobacter pylori. The Warthin-Starry stain is negative for organisms morphologically consistent with Helicobacter pylori.    06/04/2017 Procedure    Esophagogastroduodenoscopy 06/04/17 by Dr. Benson Norway Impression: Normal esophagus. Malignant gastric tumor in the cardia and in the  gastric fundus. Biopsied. Normal examined duodenum.    06/04/2017 Miscellaneous    HER2 (-) EBV (-) MSI-S PD-L1 CPS 5%    06/05/2017 Imaging    CT CAP W Contrast 06/05/17 IMPRESSION: 9.1 cm fungating mass in the gastric cardia/posterior gastric fundus, corresponding to the patient's newly diagnosed gastric Cancer. Associated extra gastric extension with peritoneal disease in the left upper abdomen,  including a dominant 4.2 cm peritoneal implant. Small volume pelvic ascites. 1.8 cm hypoenhancing lesion along the inferior spleen, indeterminate.    06/10/2017 Initial Diagnosis    Gastric cancer ([place])    06/26/2017 - 01/22/2018 Chemotherapy    First line chemo FOLFOX every 2 weeks     08/29/2017 Imaging    IMPRESSION: 1. Interval decrease in size of fungating ulcerative mass arising from the posterior gastric fundus. 2. Left peritoneal implant, compatible with peritoneal carcinomatosis, has decreased in size from the previous exam. 3. Right lobe of liver lesion present on previous exam is less conspicuous on today's study. A new lesion is identified within the left lobe of liver, suspicious for metastasis. Additional focus of low attenuation within segment 5 adjacent to the gallbladder fossa may represent focal fatty deposition. 4. Similar appearance of mildly complex cyst containing mural calcification and possible internal septation arising from the inferior pole of right kidney. More definitive characterization of this lesion with renal protocol MRI may be helpful. 5.  Mild prostate gland enlargement. 6.  Aortic Atherosclerosis (ICD10-I70.0).    11/25/2017 Imaging    11/25/2017 CT AP W Contrast IMPRESSION: 1. Mixed appearance, with the gastric fundal mass thicker than it was on 08/29/2017 (although still improved compared to 06/05/2017), but with reduced local conglomerate adenopathy along the splenic hilum compared to 08/29/2017. 2. At this time I do not see any definite hepatic metastatic lesions. Several lesions of suspicion in the left hepatic lobe and inferiorly in the right hepatic lobe are not readily visible today. There is a small focus of hypodensity adjacent to the gallbladder fossa in segment 7 which is technically nonspecific but  which could reflect focal fatty infiltration. 3. Increase in nodular contour of scarring in the left lower lobe. This is probably incidental but  I cannot completely exclude the possibility of a localized metastatic lesion. Surveillance of the left lower lobe suggested. 4. Bosniak category 4F cyst of the right kidney lower pole with internal septation and calcification. Surveillance of this lesion is suggested; I suspect that the patient's cancer surveillance will supersede the typical recommended surveillance schedule for complex cyst. 5. Other imaging findings of potential clinical significance: Aortic Atherosclerosis (ICD10-I70.0). Degenerative glenohumeral arthropathy bilaterally. Mild impingement at L4-5 and L5-S1.    12/23/2017 Imaging    12/23/2017 MRI Abdomen IMPRESSION: Ill-defined soft tissue mass in the gastric fundus shows no significant change, consistent known gastric adenocarcinoma.  No definite liver metastases identified. Hemosiderosis noted, with focal area of sparing seen in the right hepatic lobe which accounts for the lesions seen on recent CT.  Stable 1.9 cm probably benign Bosniak category 2 F cystic lesion in right kidney. Recommend continued attention on follow-up imaging.    02/12/2018 Imaging    02/12/2018 CT CAP IMPRESSION: 1. Roughly similar appearance of the mass involving the gastric cardia/fundus. 2. Hepatic steatosis with some areas of fatty sparing as shown on MRI. No definite focal metastatic lesion to the liver is identified. 3. Nonspecific 0.8 cm lymph node between the tail the pancreas in the splenic hilum. There is also a mildly enlarged but stable right hilar lymph node at 1.0 cm in diameter, significance uncertain. 4. Overall stable appearance of the 1.9 cm Bosniak category 4F cyst of the right kidney lower pole. We have now documented 9 months of relative stability. Surveillance at or before 6 months time is recommended. 5. Other imaging findings of potential clinical significance: Aortic Atherosclerosis (ICD10-I70.0). Small anterior pericardial effusion. Stable scarring in the  left lower lobe. Medial hypoenhancement of the upper spleen is probably from early phase of contrast, less likely to be splenic infarct given the relatively normal appearance on delayed images. Impingement at L4-5 and L5-S1.    02/25/2018 Surgery    On 02/25/2018, he had a diagnostic laparoscopy, liver biopsy, total gastrectomy with esophagojejunostomy en bloc with distal pancreatectomy/splenectomy, feeding jejunostomy tube with Dr. Barry Dienes.     02/25/2018 Pathology Results    02/25/2018 Surgical Pathology Diagnosis 1. Soft tissue, biopsy, Diaphragmatic Nodule - NEGATIVE FOR CARCINOMA. 2. Liver, biopsy - LIVER PARENCHYMA, NEGATIVE FOR CARCINOMA. 3. Liver, biopsy, Right - LIVER PARENCHYMA WITH CAPSULAR FIBROSIS. - NEGATIVE FOR CARCINOMA. 4. Soft tissue, biopsy, Falciform Nodule - LIVER PARENCHYMA WITH FIBROTIC BANDS. SEE NOTE. - NEGATIVE FOR CARCINOMA. 5. Omentum, resection for tumor - BENIGN PERITONEUM-LINED ADIPOSE TISSUE. - NEGATIVE FOR CARCINOMA. 6. Stomach, resection for tumor, distal pancreas and spleen - INVASIVE MODERATELY TO POORLY DIFFERENTIATED GASTRIC CARCINOMA, 7 CM. - CARCINOMA INVADES INTO PERIGASTRIC SOFT TISSUE AND IS ADHERENT TO PANCREAS AND SPLEEN BUT DOES NOT INVADE INTO PANCREATIC OR SPLENIC PARENCHYMA. - NO EVIDENCE OF LYMPHOVASCULAR OR PERINEURAL INVASION. - MINIMAL THERAPEUTIC RESPONSE (TUMOR REGRESSION SCORE 3). - METASTATIC CARCINOMA TO EIGHT OF THIRTY-SEVEN LYMPH NODES (8/37). - ALL RESECTION MARGINS ARE NEGATIVE FOR CARCINOMA. - BENIGN UNREMARKABLE PANCREAS. - SEE ONCOLOGY TABLE.    02/25/2018 Cancer Staging    Staging form: Stomach, AJCC 8th Edition - Pathologic stage from 02/25/2018: Stage III (ypT3, pN3a, cM0) - Signed by [name], MD on 04/01/2018    06/05/2018 Imaging    CT CAP W COntrast 06/05/18  IMPRESSION: 1. Interval gastrectomy with esophagojejunostomy in the  lower chest. Splenectomy and partial pancreatectomy. 2. At least 35 new  pulmonary nodules are present, measuring up to 1.5 cm in diameter, high suspicion for metastatic disease. 3. Portacaval and porta hepatis adenopathy, suspicious for malignancy. 4. Although I do not see a definite liver tumor, sensitivity for small lesions is mildly adversely affected due to the late arterial phase of contrast administration. 5. New thoracic spine compression fractures at T6, T7, T2, T3, and T4 as detailed above. New lower sternal manubrial fracture. 6. Right inguinal hernia containing a loop of small bowel, no overt obstruction or strangulation currently evident. 7. Diffuse mild subcutaneous and mesenteric edema, cause uncertain. 8. Other imaging findings of potential clinical significance: Aortic Atherosclerosis (ICD10-I70.0). Lower lumbar spondylosis and degenerative disc disease.    06/16/2018 -  Chemotherapy    On second line chemo weekly taxol andCyramzaq2weeks starting 06/16/2018. Added weekly Carboplatin with cycle 3.       CURRENT THERAPY:  On second line chemo weekly taxol andCyramza q2weeksstarting 06/16/2018, add on carboplatin AUC 1.5 weekly on 07/29/2018  INTERVAL HISTORY:  Raquel Sayres is here for a follow up and treatment. He presents to the clinic today by himself. He notes he tolerated addition of carboplatin but has taste change which has decreased his appetite. He has mild weight loss. He notes he does not have Ensure because it is too expensive for him. He notes he is able to take care of himself at home and does go out to the store often.  He notes having stable neuropathy overall. He is no longer taking oral B complex. He notes having mainly numbness.  I reviewed his medication list with him.     REVIEW OF SYSTEMS:   Constitutional: Denies fevers, chills (+) taste change, loss of appetite, weight loss Eyes: Denies blurriness of vision Ears, nose, mouth, throat, and face: Denies mucositis or sore throat Respiratory: Denies cough, dyspnea or  wheezes Cardiovascular: Denies palpitation, chest discomfort or lower extremity swelling Gastrointestinal:  Denies nausea, heartburn or change in bowel habits Skin: Denies abnormal skin rashes Lymphatics: Denies new lymphadenopathy or easy bruising Neurological:Denies new weaknesses (+) Neuropathy, mainly with numbness Behavioral/Psych: Mood is stable, no new changes  All other systems were reviewed with the patient and are negative.  MEDICAL HISTORY:  Past Medical History:  Diagnosis Date  . Cancer [facility])    gastric cancer  . DVT (deep venous thrombosis) ([place]) 2011  . GI bleeding 2001  . History of DVT of lower extremity   . Hypertension   . Melena 05/2017  . Stomach ulcer    Clinically suspected; No EGD confirmation as of 06/03/17    SURGICAL HISTORY: Past Surgical History:  Procedure Laterality Date  . ESOPHAGOGASTRODUODENOSCOPY  06/04/2017  . ESOPHAGOGASTRODUODENOSCOPY N/A 06/04/2017   Procedure: ESOPHAGOGASTRODUODENOSCOPY (EGD);  Surgeon: Carol Ada, MD;  Location: [place];  Service: Endoscopy;  Laterality: N/A;  . GASTRECTOMY N/A 02/25/2018   Procedure: TOTAL GASTRECTOMY WITH J TUBE PLACEMENT;  Surgeon: [name], MD;  Location: [place];  Service: General;  Laterality: N/A;  . IR FLUORO GUIDE PORT INSERTION RIGHT  06/20/2017  . IR US GUIDE VASC ACCESS RIGHT  06/20/2017  . JEJUNOSTOMY N/A 02/25/2018   Procedure: JEJUNOSTOMY TUBE;  Surgeon: [name], MD;  Location: [place];  Service: General;  Laterality: N/A;  . LAPAROSCOPY N/A 02/25/2018   Procedure: LAPAROSCOPY DIAGNOSTIC;  Surgeon: [name], MD;  Location: [place];  Service: General;  Laterality: N/A;  . SPLENECTOMY, TOTAL N/A 02/25/2018  Procedure: SPLENECTOMY;  Surgeon: [name], MD;  Location: [place];  Service: General;  Laterality: N/A;    I have reviewed the social history and family history with the patient and they are unchanged from previous note.  ALLERGIES:  has No Known Allergies.   MEDICATIONS:  Current Outpatient Medications  Medication Sig Dispense Refill  . acetaminophen (TYLENOL) 500 MG tablet Take 1,000 mg by mouth every 6 (six) hours as needed for mild pain.     . Amino Acids-Protein Hydrolys (FEEDING SUPPLEMENT, PRO-STAT SUGAR FREE 64,) LIQD Place 30 mLs into feeding tube 3 (three) times daily. 900 mL 0  . amLODipine (NORVASC) 2.5 MG tablet Take 1 tablet (2.5 mg total) by mouth daily. 90 tablet 3  . cyclobenzaprine (FLEXERIL) 10 MG tablet Take 1 tablet (10 mg total) by mouth 3 (three) times daily as needed for muscle spasms. 30 tablet 1  . lidocaine-prilocaine (EMLA) cream Apply 1 application topically as needed (for port). 30 g 2  . magic mouthwash w/lidocaine SOLN Take 5 mLs by mouth 4 (four) times daily as needed for mouth pain. 240 mL 2  . ondansetron (ZOFRAN) 8 MG tablet Take 8 mg by mouth every 8 (eight) hours as needed.    . potassium chloride SA (K-DUR,KLOR-CON) 20 MEQ tablet Take 1 tablet (20 mEq total) by mouth 2 (two) times daily. 60 tablet 2  . prochlorperazine (COMPAZINE) 10 MG tablet Take 10 mg by mouth every 6 (six) hours as needed.    . vitamin B-12 (CYANOCOBALAMIN) 1000 MCG tablet Take 1 tablet (1,000 mcg total) by mouth daily. 30 tablet 0   No current facility-administered medications for this visit.    Facility-Administered Medications Ordered in Other Visits  Medication Dose Route Frequency Provider Last Rate Last Dose  . CARBOplatin (PARAPLATIN) 190 mg in sodium chloride 0.9 % 100 mL chemo infusion  190 mg Intravenous Once [name], MD      . famotidine (PEPCID) 20 mg in sodium chloride 0.9 % 100 mL IVPB  20 mg Intravenous Once [name], MD      . heparin lock flush 100 unit/mL  500 Units Intracatheter Once PRN [name], MD      . PACLitaxel (TAXOL) 150 mg in sodium chloride 0.9 % 250 mL chemo infusion (</= '80mg'$ /m2)  80 mg/m2 (Treatment Plan Recorded) Intravenous Once [name], MD      . potassium chloride SA (K-DUR,KLOR-CON) CR tablet 40  mEq  40 mEq Oral BID [name], MD   40 mEq at 07/29/18 1343  . sodium chloride flush (NS) 0.9 % injection 10 mL  10 mL Intracatheter PRN [name], MD   10 mL at 07/29/18 1707  . sodium chloride flush (NS) 0.9 % injection 10 mL  10 mL Intracatheter PRN [name], MD        PHYSICAL EXAMINATION: ECOG PERFORMANCE STATUS: 1 - Symptomatic but completely ambulatory  Vitals:   08/11/18 1113  BP: (!) 145/84  Pulse: 61  Resp: 18  Temp: 97.8 F (36.6 C)  SpO2: 100%   Filed Weights   08/11/18 1113  Weight: 133 lb 11.2 oz (60.6 kg)    GENERAL:alert, no distress and comfortable SKIN: skin color, texture, turgor are normal, no rashes or significant lesions EYES: normal, Conjunctiva are pink and non-injected, sclera clear OROPHARYNX:no exudate, no erythema and lips, buccal mucosa, and tongue normal  NECK: supple, thyroid normal size, non-tender, without nodularity LYMPH:  no palpable lymphadenopathy in the cervical, axillary or inguinal LUNGS:  clear to auscultation and percussion with normal breathing effort HEART: regular rate & rhythm and no murmurs and no lower extremity edema ABDOMEN:abdomen soft, non-tender and normal bowel sounds Musculoskeletal:no cyanosis of digits and no clubbing  NEURO: alert & oriented x 3 with fluent speech, no focal motor/sensory deficits  LABORATORY DATA:  I have reviewed the data as listed CBC Latest Ref Rng & Units 08/11/2018 07/29/2018 07/22/2018  WBC 4.0 - 10.5 K/uL 4.2 5.9 7.0  Hemoglobin 13.0 - 17.0 g/dL 10.8(L) 11.0(L) 12.2(L)  Hematocrit 39.0 - 52.0 % 31.5(L) 32.8(L) 35.4(L)  Platelets 150 - 400 K/uL 345 557(H) 467(H)     CMP Latest Ref Rng & Units 08/11/2018 07/29/2018 07/22/2018  Glucose 70 - 99 mg/dL 82 96 116(H)  BUN 6 - 20 mg/dL 7 5(L) 17  Creatinine 0.61 - 1.24 mg/dL 0.67 0.70 1.03  Sodium 135 - 145 mmol/L 144 142 136  Potassium 3.5 - 5.1 mmol/L 4.2 3.1(L) 3.6  Chloride 98 - 111 mmol/L 110 107 96(L)  CO2 22 - 32 mmol/L '26 25 25  '$ Calcium  8.9 - 10.3 mg/dL 8.1(L) 8.0(L) 8.8(L)  Total Protein 6.5 - 8.1 g/dL 5.9(L) 5.8(L) 7.2  Total Bilirubin 0.3 - 1.2 mg/dL 0.5 0.4 0.8  Alkaline Phos 38 - 126 U/L 65 59 71  AST 15 - 41 U/L '16 15 20  '$ ALT 0 - 44 U/L '11 10 11      '$ RADIOGRAPHIC STUDIES: I have personally reviewed the radiological images as listed and agreed with the findings in the report. No results found.   ASSESSMENT & PLAN:  Ryan Collins is a 58 y.o. male with   1. Gastric cancer, T4bNxM1 with presumedperitoneal metastasis, adenocarcinoma, HER2(-), MSI-stable, PD-L1positive (5%), ypT3N3aM0, lung metastasis in 05/2018 -He was diagnosed in 05/2017.Initial staging scan showed a large mass in LUQ,presumably peritoneal metastasis. He underwent several months of FOLFOX, followed bytotal gastrectomywith esophagojejunostomy en bloc with distal pancreatectomy/splenectomywith J-tube in placein 02/2018 -Due to his prolonged neoadjuvant chemotherapywith FOLOX, I do not think he would benefit more from adjuvant chemo.Due to the D2 resection, adjuvant radiation wasalso not recommended -Unfortunately he developed lung metastasis in Jan 2020 -I have started him on second line chemo weekly taxol andCyramzastarting 06/16/2018. Added Carboplatin on 07/29/18.  -He tolerated the addition of Carboplatin well overall. He is clinically stable overall but he continues to lose weight. Will assist with obtaining ensure boost and encouraged him to eat more frequent smaller meals.  -Labs reviewed, CBC and CMP WNL except mild anemia, Ca 8.1, protein 5.9, albumin 31. Overall adequate to proceed with Taxol, carbo and Cyramza today  -I reviewed COVID-19 precautions and encouraged him to continue social distancing given he is immunocompromised and his comorbidities.  -F/u in 2 weeks -plan to repeat scan in early May   2. Anemia of iron deficiency and tumor bleeding, B12 deficiency -He has clinical GI bleeding with melena, iron study also  showed iron deficiency. -He has received blood transfusion, and3dose of IV Ferahemepreviously, responded well. -takesOral-B complex -Hg at 10.8 today (08/11/18)   3. History of DVT, ? Protein C deficiency -He was on coumadin for his DVT before, and previous labs showed a protein C deficiency, which increases his risk of thrombosis. -We previously discussed the high risk of thrombosis secondary to his underlying malignancy. -he is not on anyphylactic anticoagulation.  4. History of alcohol and marijuana abuse -He has quit drinking alcohol since his diagnosis of cancer -Hestilldoes smoke marijuana occasionally  5. Weight loss and malnutrition  -  He has lost significant weight since the diagnosisand surgery -His J-tube fell outincidentally in December 2019 -Will continue to f/uwith ourdietitian -His weight continues to decline. He is not able to afford Ensure. I will get him sample and give prescription. I encouraged him to take 2 a day.   6. Peripheral neuropathy, grade 1, second to chemotherapy  -He has neuropathy on his fingers and toes, mainly numbness. Currently manageable  -Will monitor, If worsens will start him on Gabapentin.   7. Abdominal pain  -Secondary to surgery  -He completed his prescription of oxycodone given by Dr. Barry Dienes.  -Due to his previous substance abuse,I will be cautious about narcotics -takes flexeril,Tylenol and Alkazaltzer. He has not needed flexeril lately.  -Currently resolved.  8. Thoracic compression fracture -I have discussed histhoracicspineMRIfindings with pt, no evidence of bone mets  -continue calcium and Vitd -may considerZometa infusionevery48month -his pain resolved.   9. HTN -His Amlodipine was recently dose reduced to 2.'5mg'$  daily according to patient. -I discussed his chemo can increase BP, will monitor. May increase amlodipine again if uncontrolled. -BP at 145/84 today (08/11/18)  10. Hypokalemia -on  oral KCL  -K normal today (08/11/18)  Plan -Labs reviewed and adequate to proceed withC4D1 Taxol, Cyramzaand Carboplatin today, he will return next week for D8 chemo  -lab, flush, f/u and treatment in 2 weeks    No problem-specific Assessment & Plan notes found for this encounter.   No orders of the defined types were placed in this encounter.  All questions were answered. The patient knows to call the clinic with any problems, questions or concerns. No barriers to learning was detected. I spent 20 minutes counseling the patient face to face. The total time spent in the appointment was 25 minutes and more than 50% was on counseling and review of test results     YTruitt Merle MD 08/11/2018   I, AJoslyn Devon am acting as scribe for YTruitt Merle MD.   I have reviewed the above documentation for accuracy and completeness, and I agree with the above.

## 2018-08-11 ENCOUNTER — Inpatient Hospital Stay: Payer: Medicaid Other

## 2018-08-11 ENCOUNTER — Encounter: Payer: Self-pay | Admitting: Nutrition

## 2018-08-11 ENCOUNTER — Inpatient Hospital Stay (HOSPITAL_BASED_OUTPATIENT_CLINIC_OR_DEPARTMENT_OTHER): Payer: Medicaid Other | Admitting: Hematology

## 2018-08-11 ENCOUNTER — Other Ambulatory Visit: Payer: Self-pay | Admitting: Hematology

## 2018-08-11 ENCOUNTER — Encounter: Payer: Self-pay | Admitting: Hematology

## 2018-08-11 ENCOUNTER — Other Ambulatory Visit: Payer: Self-pay

## 2018-08-11 VITALS — BP 145/84 | HR 61 | Temp 97.8°F | Resp 18 | Ht 71.0 in | Wt 133.7 lb

## 2018-08-11 DIAGNOSIS — C161 Malignant neoplasm of fundus of stomach: Secondary | ICD-10-CM | POA: Diagnosis not present

## 2018-08-11 DIAGNOSIS — C786 Secondary malignant neoplasm of retroperitoneum and peritoneum: Secondary | ICD-10-CM

## 2018-08-11 DIAGNOSIS — D500 Iron deficiency anemia secondary to blood loss (chronic): Secondary | ICD-10-CM

## 2018-08-11 DIAGNOSIS — F1010 Alcohol abuse, uncomplicated: Secondary | ICD-10-CM

## 2018-08-11 DIAGNOSIS — Z980 Intestinal bypass and anastomosis status: Secondary | ICD-10-CM

## 2018-08-11 DIAGNOSIS — Z5112 Encounter for antineoplastic immunotherapy: Secondary | ICD-10-CM | POA: Diagnosis not present

## 2018-08-11 DIAGNOSIS — E46 Unspecified protein-calorie malnutrition: Secondary | ICD-10-CM

## 2018-08-11 DIAGNOSIS — G629 Polyneuropathy, unspecified: Secondary | ICD-10-CM

## 2018-08-11 DIAGNOSIS — C7800 Secondary malignant neoplasm of unspecified lung: Secondary | ICD-10-CM | POA: Diagnosis not present

## 2018-08-11 DIAGNOSIS — E538 Deficiency of other specified B group vitamins: Secondary | ICD-10-CM

## 2018-08-11 DIAGNOSIS — Z7901 Long term (current) use of anticoagulants: Secondary | ICD-10-CM

## 2018-08-11 DIAGNOSIS — D509 Iron deficiency anemia, unspecified: Secondary | ICD-10-CM

## 2018-08-11 DIAGNOSIS — F1210 Cannabis abuse, uncomplicated: Secondary | ICD-10-CM

## 2018-08-11 DIAGNOSIS — M4854XA Collapsed vertebra, not elsewhere classified, thoracic region, initial encounter for fracture: Secondary | ICD-10-CM

## 2018-08-11 DIAGNOSIS — I10 Essential (primary) hypertension: Secondary | ICD-10-CM

## 2018-08-11 DIAGNOSIS — Z79899 Other long term (current) drug therapy: Secondary | ICD-10-CM

## 2018-08-11 DIAGNOSIS — Z903 Acquired absence of stomach [part of]: Secondary | ICD-10-CM

## 2018-08-11 DIAGNOSIS — Z86718 Personal history of other venous thrombosis and embolism: Secondary | ICD-10-CM

## 2018-08-11 DIAGNOSIS — Z934 Other artificial openings of gastrointestinal tract status: Secondary | ICD-10-CM

## 2018-08-11 DIAGNOSIS — Z95828 Presence of other vascular implants and grafts: Secondary | ICD-10-CM

## 2018-08-11 LAB — CMP (CANCER CENTER ONLY)
ALT: 11 U/L (ref 0–44)
AST: 16 U/L (ref 15–41)
Albumin: 3.1 g/dL — ABNORMAL LOW (ref 3.5–5.0)
Alkaline Phosphatase: 65 U/L (ref 38–126)
Anion gap: 8 (ref 5–15)
BUN: 7 mg/dL (ref 6–20)
CO2: 26 mmol/L (ref 22–32)
Calcium: 8.1 mg/dL — ABNORMAL LOW (ref 8.9–10.3)
Chloride: 110 mmol/L (ref 98–111)
Creatinine: 0.67 mg/dL (ref 0.61–1.24)
GFR, Est AFR Am: >60 mL/min (ref >60)
GFR, Estimated: >60 mL/min (ref >60)
GLUCOSE: 82 mg/dL (ref 70–99)
Potassium: 4.2 mmol/L (ref 3.5–5.1)
Sodium: 144 mmol/L (ref 135–145)
Total Bilirubin: 0.5 mg/dL (ref 0.3–1.2)
Total Protein: 5.9 g/dL — ABNORMAL LOW (ref 6.5–8.1)

## 2018-08-11 LAB — CBC WITH DIFFERENTIAL (CANCER CENTER ONLY)
ABS IMMATURE GRANULOCYTES: 0.01 10*3/uL (ref 0.00–0.07)
BASOS PCT: 0 %
Basophils Absolute: 0 10*3/uL (ref 0.0–0.1)
Eosinophils Absolute: 0 10*3/uL (ref 0.0–0.5)
Eosinophils Relative: 0 %
HCT: 31.5 % — ABNORMAL LOW (ref 39.0–52.0)
Hemoglobin: 10.8 g/dL — ABNORMAL LOW (ref 13.0–17.0)
Immature Granulocytes: 0 %
Lymphocytes Relative: 48 %
Lymphs Abs: 2 10*3/uL (ref 0.7–4.0)
MCH: 29 pg (ref 26.0–34.0)
MCHC: 34.3 g/dL (ref 30.0–36.0)
MCV: 84.5 fL (ref 80.0–100.0)
MONOS PCT: 9 %
Monocytes Absolute: 0.4 10*3/uL (ref 0.1–1.0)
Neutro Abs: 1.8 10*3/uL (ref 1.7–7.7)
Neutrophils Relative %: 43 %
PLATELETS: 345 10*3/uL (ref 150–400)
RBC: 3.73 MIL/uL — ABNORMAL LOW (ref 4.22–5.81)
RDW: 20 % — ABNORMAL HIGH (ref 11.5–15.5)
WBC Count: 4.2 10*3/uL (ref 4.0–10.5)
nRBC: 0.5 % — ABNORMAL HIGH (ref 0.0–0.2)

## 2018-08-11 MED ORDER — SODIUM CHLORIDE 0.9 % IV SOLN
170.0000 mg | Freq: Once | INTRAVENOUS | Status: AC
  Administered 2018-08-11: 170 mg via INTRAVENOUS
  Filled 2018-08-11: qty 17

## 2018-08-11 MED ORDER — SODIUM CHLORIDE 0.9 % IV SOLN: 190.0000 mg | Freq: Once | INTRAVENOUS | Status: DC

## 2018-08-11 MED ORDER — DEXAMETHASONE SODIUM PHOSPHATE 10 MG/ML IJ SOLN
INTRAMUSCULAR | Status: AC
  Filled 2018-08-11: qty 1

## 2018-08-11 MED ORDER — DIPHENHYDRAMINE HCL 50 MG/ML IJ SOLN
INTRAMUSCULAR | Status: AC
  Filled 2018-08-11: qty 1

## 2018-08-11 MED ORDER — SODIUM CHLORIDE 0.9 % IV SOLN: 191.4000 mg | Freq: Once | INTRAVENOUS | Status: DC

## 2018-08-11 MED ORDER — SODIUM CHLORIDE 0.9% FLUSH
10.0000 mL | INTRAVENOUS | Status: DC | PRN
  Administered 2018-08-11: 10 mL
  Filled 2018-08-11: qty 10

## 2018-08-11 MED ORDER — HEPARIN SOD (PORK) LOCK FLUSH 100 UNIT/ML IV SOLN
500.0000 [IU] | Freq: Once | INTRAVENOUS | Status: DC | PRN
  Filled 2018-08-11: qty 5

## 2018-08-11 MED ORDER — SODIUM CHLORIDE 0.9 % IV SOLN
Freq: Once | INTRAVENOUS | Status: DC
  Filled 2018-08-11: qty 250

## 2018-08-11 MED ORDER — PALONOSETRON HCL INJECTION 0.25 MG/5ML: 0.2500 mg | Freq: Once | INTRAVENOUS | Status: DC

## 2018-08-11 MED ORDER — DEXAMETHASONE SODIUM PHOSPHATE 10 MG/ML IJ SOLN: 5.0000 mg | Freq: Once | INTRAMUSCULAR | Status: DC

## 2018-08-11 MED ORDER — SODIUM CHLORIDE 0.9 % IV SOLN
Freq: Once | INTRAVENOUS | Status: AC
  Administered 2018-08-11: 12:00:00 via INTRAVENOUS
  Filled 2018-08-11: qty 250

## 2018-08-11 MED ORDER — SODIUM CHLORIDE 0.9% FLUSH
10.0000 mL | INTRAVENOUS | Status: DC | PRN
  Filled 2018-08-11: qty 10

## 2018-08-11 MED ORDER — ACETAMINOPHEN 325 MG PO TABS
ORAL_TABLET | ORAL | Status: AC
  Filled 2018-08-11: qty 2

## 2018-08-11 MED ORDER — PALONOSETRON HCL INJECTION 0.25 MG/5ML
INTRAVENOUS | Status: AC
  Filled 2018-08-11: qty 5

## 2018-08-11 MED ORDER — SODIUM CHLORIDE 0.9 % IV SOLN
500.0000 mg | Freq: Once | INTRAVENOUS | Status: AC
  Administered 2018-08-11: 500 mg via INTRAVENOUS
  Filled 2018-08-11: qty 50

## 2018-08-11 MED ORDER — SODIUM CHLORIDE 0.9 % IV SOLN
80.0000 mg/m2 | Freq: Once | INTRAVENOUS | Status: AC
  Administered 2018-08-11: 150 mg via INTRAVENOUS
  Filled 2018-08-11: qty 25

## 2018-08-11 MED ORDER — FAMOTIDINE IN NACL 20-0.9 MG/50ML-% IV SOLN: 20.0000 mg | Freq: Once | INTRAVENOUS | Status: DC

## 2018-08-11 MED ORDER — DEXAMETHASONE SODIUM PHOSPHATE 10 MG/ML IJ SOLN
5.0000 mg | Freq: Once | INTRAMUSCULAR | Status: AC
  Administered 2018-08-11: 5 mg via INTRAVENOUS

## 2018-08-11 MED ORDER — SODIUM CHLORIDE 0.9% FLUSH
10.0000 mL | Freq: Once | INTRAVENOUS | Status: AC
  Administered 2018-08-11: 10 mL
  Filled 2018-08-11: qty 10

## 2018-08-11 MED ORDER — ACETAMINOPHEN 325 MG PO TABS: 325.0000 mg | ORAL_TABLET | Freq: Once | ORAL | Status: DC

## 2018-08-11 MED ORDER — ACETAMINOPHEN 325 MG PO TABS
650.0000 mg | ORAL_TABLET | Freq: Once | ORAL | Status: AC
  Administered 2018-08-11: 650 mg via ORAL

## 2018-08-11 MED ORDER — DIPHENHYDRAMINE HCL 50 MG/ML IJ SOLN: 50.0000 mg | Freq: Once | INTRAMUSCULAR | Status: DC

## 2018-08-11 MED ORDER — DIPHENHYDRAMINE HCL 50 MG/ML IJ SOLN
50.0000 mg | Freq: Once | INTRAMUSCULAR | Status: AC
  Administered 2018-08-11: 50 mg via INTRAVENOUS

## 2018-08-11 MED ORDER — SODIUM CHLORIDE 0.9 % IV SOLN: 80.0000 mg/m2 | Freq: Once | INTRAVENOUS | Status: DC

## 2018-08-11 MED ORDER — SODIUM CHLORIDE 0.9 % IV SOLN
20.0000 mg | Freq: Once | INTRAVENOUS | Status: AC
  Administered 2018-08-11: 20 mg via INTRAVENOUS
  Filled 2018-08-11: qty 2

## 2018-08-11 MED ORDER — HEPARIN SOD (PORK) LOCK FLUSH 100 UNIT/ML IV SOLN
500.0000 [IU] | Freq: Once | INTRAVENOUS | Status: AC | PRN
  Administered 2018-08-11: 500 [IU]
  Filled 2018-08-11: qty 5

## 2018-08-11 MED ORDER — PALONOSETRON HCL INJECTION 0.25 MG/5ML
0.2500 mg | Freq: Once | INTRAVENOUS | Status: AC
  Administered 2018-08-11: 0.25 mg via INTRAVENOUS

## 2018-08-11 NOTE — Progress Notes (Signed)
Patient was given a case of Ensure and a script for Ensure as well.

## 2018-08-11 NOTE — Patient Instructions (Signed)
Coronavirus (COVID-19) Are you at risk?  Are you at risk for the Coronavirus (COVID-19)?  To be considered HIGH RISK for Coronavirus (COVID-19), you have to meet the following criteria:  . Traveled to [place], [place], [place], [place] or [place]; or in the [place] to [place], [place], [place], or [place]; and have fever, cough, and shortness of breath within the last 2 weeks of travel OR . Been in close contact with a person diagnosed with COVID-19 within the last 2 weeks and have fever, cough, and shortness of breath . IF YOU DO NOT MEET THESE CRITERIA, YOU ARE CONSIDERED LOW RISK FOR COVID-19.  What to do if you are HIGH RISK for COVID-19?  . If you are having a medical emergency, call 911. . Seek medical care right away. Before you go to a doctor's office, urgent care or emergency department, call ahead and tell them about your recent travel, contact with someone diagnosed with COVID-19, and your symptoms. You should receive instructions from your physician's office regarding next steps of care.  . When you arrive at healthcare provider, tell the healthcare staff immediately you have returned from visiting [place], [place], [place], [place] or [place]; or traveled in the [place] to [place], [place], [place], or [place]; in the last two weeks or you have been in close contact with a person diagnosed with COVID-19 in the last 2 weeks.   . Tell the health care staff about your symptoms: fever, cough and shortness of breath. . After you have been seen by a medical provider, you will be either: o Tested for (COVID-19) and discharged home on quarantine except to seek medical care if symptoms worsen, and asked to  - Stay home and avoid contact with others until you get your results (4-5 days)  - Avoid travel on public transportation if possible (such as bus, train, or airplane) or o Sent to the Emergency Department by EMS for evaluation, COVID-19 testing, and possible  admission depending on your condition and test results.  What to do if you are LOW RISK for COVID-19?  Reduce your risk of any infection by using the same precautions used for avoiding the common cold or flu:  . Wash your hands often with soap and warm water for at least 20 seconds.  If soap and water are not readily available, use an alcohol-based hand sanitizer with at least 60% alcohol.  . If coughing or sneezing, cover your mouth and nose by coughing or sneezing into the elbow areas of your shirt or coat, into a tissue or into your sleeve (not your hands). . Avoid shaking hands with others and consider head nods or verbal greetings only. . Avoid touching your eyes, nose, or mouth with unwashed hands.  . Avoid close contact with people who are sick. . Avoid places or events with large numbers of people in one location, like concerts or sporting events. . Carefully consider travel plans you have or are making. . If you are planning any travel outside or inside the [place], visit the CDC's Travelers' Health webpage for the latest health notices. . If you have some symptoms but not all symptoms, continue to monitor at home and seek medical attention if your symptoms worsen. . If you are having a medical emergency, call 911.   ADDITIONAL HEALTHCARE OPTIONS FOR PATIENTS  [facility] / e-Visit: [email or web address]         [facility]: [phone]  Onset   Urgent Care: [facility]: [facility] Discharge Instructions for Patients Receiving Chemotherapy  Today you received the following chemotherapy agents Taxol and Carboplatin  To help prevent nausea and vomiting after your treatment, we encourage you to take your nausea medication as directed.   If you develop nausea and vomiting that is not controlled by your nausea medication, call the clinic.    BELOW ARE SYMPTOMS THAT SHOULD BE REPORTED IMMEDIATELY:  *FEVER GREATER THAN 100.5 F  *CHILLS WITH OR WITHOUT FEVER  NAUSEA AND VOMITING THAT IS NOT CONTROLLED WITH YOUR NAUSEA MEDICATION  *UNUSUAL SHORTNESS OF BREATH  *UNUSUAL BRUISING OR BLEEDING  TENDERNESS IN MOUTH AND THROAT WITH OR WITHOUT PRESENCE OF ULCERS  *URINARY PROBLEMS  *BOWEL PROBLEMS  UNUSUAL RASH Items with * indicate a potential emergency and should be followed up as soon as possible.  Feel free to call the clinic you have any questions or concerns. The clinic phone number is [phone].   Carboplatin injection What is this medicine? CARBOPLATIN (KAR boe pla tin) is a chemotherapy drug. It targets fast dividing cells, like cancer cells, and causes these cells to die. This medicine is used to treat ovarian cancer and many other cancers. This medicine may be used for other purposes; ask your health care provider or pharmacist if you have questions. COMMON BRAND NAME(S): Paraplatin What should I tell my health care provider before I take this medicine? They need to know if you have any of these conditions: -blood disorders -hearing problems -kidney disease -recent or ongoing radiation therapy -an unusual or allergic reaction to carboplatin, cisplatin, other chemotherapy, other medicines, foods, dyes, or preservatives -pregnant or trying to get pregnant -breast-feeding How should I use this medicine? This drug is usually given as an infusion into a vein. It is administered in a hospital or clinic by a specially trained health care professional. Talk to your pediatrician regarding the use of this medicine in children. Special care may be needed. Overdosage: If you think you have taken too much of this medicine contact a poison control center or emergency room at once. NOTE: This medicine is only for you. Do not share this medicine with others. What if I miss a dose? It is important not to miss a dose.  Call your doctor or health care professional if you are unable to keep an appointment. What may interact with this medicine? -medicines for seizures -medicines to increase blood counts like filgrastim, pegfilgrastim, sargramostim -some antibiotics like amikacin, gentamicin, neomycin, streptomycin, tobramycin -vaccines Talk to your doctor or health care professional before taking any of these medicines: -acetaminophen -aspirin -ibuprofen -ketoprofen -naproxen This list may not describe all possible interactions. Give your health care provider a list of all the medicines, herbs, non-prescription drugs, or dietary supplements you use. Also tell them if you smoke, drink alcohol, or use illegal drugs. Some items may interact with your medicine. What should I watch for while using this medicine? Your condition will be monitored carefully while you are receiving this medicine. You will need important blood work done while you are taking this medicine. This drug may make you feel generally unwell. This is not uncommon, as chemotherapy can affect healthy cells as well as cancer cells. Report any side effects. Continue your course of treatment even though you feel ill unless your doctor tells you to  stop. In some cases, you may be given additional medicines to help with side effects. Follow all directions for their use. Call your doctor or health care professional for advice if you get a fever, chills or sore throat, or other symptoms of a cold or flu. Do not treat yourself. This drug decreases your body's ability to fight infections. Try to avoid being around people who are sick. This medicine may increase your risk to bruise or bleed. Call your doctor or health care professional if you notice any unusual bleeding. Be careful brushing and flossing your teeth or using a toothpick because you may get an infection or bleed more easily. If you have any dental work done, tell your dentist you are receiving this  medicine. Avoid taking products that contain aspirin, acetaminophen, ibuprofen, naproxen, or ketoprofen unless instructed by your doctor. These medicines may hide a fever. Do not become pregnant while taking this medicine. Women should inform their doctor if they wish to become pregnant or think they might be pregnant. There is a potential for serious side effects to an unborn child. Talk to your health care professional or pharmacist for more information. Do not breast-feed an infant while taking this medicine. What side effects may I notice from receiving this medicine? Side effects that you should report to your doctor or health care professional as soon as possible: -allergic reactions like skin rash, itching or hives, swelling of the face, lips, or tongue -signs of infection - fever or chills, cough, sore throat, pain or difficulty passing urine -signs of decreased platelets or bleeding - bruising, pinpoint red spots on the skin, black, tarry stools, nosebleeds -signs of decreased red blood cells - unusually weak or tired, fainting spells, lightheadedness -breathing problems -changes in hearing -changes in vision -chest pain -high blood pressure -low blood counts - This drug may decrease the number of white blood cells, red blood cells and platelets. You may be at increased risk for infections and bleeding. -nausea and vomiting -pain, swelling, redness or irritation at the injection site -pain, tingling, numbness in the hands or feet -problems with balance, talking, walking -trouble passing urine or change in the amount of urine Side effects that usually do not require medical attention (report to your doctor or health care professional if they continue or are bothersome): -hair loss -loss of appetite -metallic taste in the mouth or changes in taste This list may not describe all possible side effects. Call your doctor for medical advice about side effects. You may report side effects to  FDA at 1-800-FDA-1088. Where should I keep my medicine? This drug is given in a hospital or clinic and will not be stored at home. NOTE: This sheet is a summary. It may not cover all possible information. If you have questions about this medicine, talk to your doctor, pharmacist, or health care provider.  2019 Elsevier/Gold Standard (2007-08-05 14:38:05)   Hypokalemia Hypokalemia means that the amount of potassium in the blood is lower than normal.Potassium is a chemical that helps regulate the amount of fluid in the body (electrolyte). It also stimulates muscle tightening (contraction) and helps nerves work properly.Normally, most of the body's potassium is inside of cells, and only a very small amount is in the blood. Because the amount in the blood is so small, minor changes to potassium levels in the blood can be life-threatening. What are the causes? This condition may be caused by:  Antibiotic medicine.  Diarrhea or vomiting. Taking too much of a medicine  that helps you have a bowel movement (laxative) can cause diarrhea and lead to hypokalemia.  Chronic kidney disease (CKD).  Medicines that help the body get rid of excess fluid (diuretics).  Eating disorders, such as bulimia.  Low magnesium levels in the body.  Sweating a lot. What are the signs or symptoms? Symptoms of this condition include:  Weakness.  Constipation.  Fatigue.  Muscle cramps.  Mental confusion.  Skipped heartbeats or irregular heartbeat (palpitations).  Tingling or numbness. How is this diagnosed? This condition is diagnosed with a blood test. How is this treated? Hypokalemia can be treated by taking potassium supplements by mouth or adjusting the medicines that you take. Treatment may also include eating more foods that contain a lot of potassium. If your potassium level is very low, you may need to get potassium through an IV tube in one of your veins and be monitored in the hospital. Follow  these instructions at home:   Take over-the-counter and prescription medicines only as told by your health care provider. This includes vitamins and supplements.  Eat a healthy diet. A healthy diet includes fresh fruits and vegetables, whole grains, healthy fats, and lean proteins.  If instructed, eat more foods that contain a lot of potassium, such as: ? Nuts, such as peanuts and pistachios. ? Seeds, such as sunflower seeds and pumpkin seeds. ? Peas, lentils, and lima beans. ? Whole grain and bran cereals and breads. ? Fresh fruits and vegetables, such as apricots, avocado, bananas, cantaloupe, kiwi, oranges, tomatoes, asparagus, and potatoes. ? Orange juice. ? Tomato juice. ? Red meats. ? Yogurt.  Keep all follow-up visits as told by your health care provider. This is important. Contact a health care provider if:  You have weakness that gets worse.  You feel your heart pounding or racing.  You vomit.  You have diarrhea.  You have diabetes (diabetes mellitus) and you have trouble keeping your blood sugar (glucose) in your target range. Get help right away if:  You have chest pain.  You have shortness of breath.  You have vomiting or diarrhea that lasts for more than 2 days.  You faint. This information is not intended to replace advice given to you by your health care provider. Make sure you discuss any questions you have with your health care provider. Document Released: 04/30/2005 Document Revised: 12/17/2015 Document Reviewed: 12/17/2015 Elsevier Interactive Patient Education  2019 [employer].

## 2018-08-11 NOTE — Progress Notes (Signed)
Provided one complimentary case of Ensure Enlive. 

## 2018-08-11 NOTE — Progress Notes (Signed)
Today is Cycle 4 - Day 1 per MD. Pt will receive Taxol, Carbo, and Cyramza. Orders changed as discussed.  Hardie Pulley, PharmD, BCPS, BCOP

## 2018-08-11 NOTE — Progress Notes (Signed)
Change Cyramza to 500mg  and Carboplatin to 170mg  (AUC 1.5) based on wt loss and previous toleration. Give APAP 650mg  not 325mg .  B. Corey Skains, PharmD, BCPS, BCOP

## 2018-08-12 ENCOUNTER — Telehealth: Payer: Self-pay | Admitting: Hematology

## 2018-08-12 ENCOUNTER — Telehealth: Payer: Self-pay | Admitting: Nutrition

## 2018-08-12 ENCOUNTER — Ambulatory Visit: Payer: Medicaid Other | Admitting: Nutrition

## 2018-08-12 NOTE — Telephone Encounter (Signed)
RD working remotely.  Contacted patient by phone to follow up on nutrition. Patient has had 15% weight loss since Jan 27. (23.3 pounds) Patient was provided with one complimentary case of Ensure Enlive yesterday. I was unable to reach patient by phone and he does not have a VM box set up.

## 2018-08-12 NOTE — Progress Notes (Signed)
RD working remotely.  58 year old male diagnosed with Gastric Cancer S/P total gastrectomy. He is on 2nd line chemotherapy with weekly taxol/cyramza/carbo. He had a J-tube but it fell out in December 2019.  Weight was documented as 133.7 pounds on March 30, decreased from 157 pounds January 27. Current BMI is 18.65. Patient reports taste alterations. Food tastes bland for 2-3 days after chemotherapy.   Nutrition Diagnosis: Food and Nutrition related knowledge deficit continues.  Intervention:  Educated patient to try to drink 1 bottle of Ensure Enlive slowly TID. Encouraged small frequent meals and snacks. Encouraged adequate fluid intake. Provided office number for questions and concerns.  Monitoring, Evaluation, Goals: Patient will tolerate adequate calories and protein for weight maintenance.  Next Visit: Patient will contact me for questions and concerns. He understands he can pick up Ensure Enlive on next visit.

## 2018-08-12 NOTE — Telephone Encounter (Signed)
No los per 3/30. °

## 2018-08-14 ENCOUNTER — Encounter: Payer: Self-pay | Admitting: General Practice

## 2018-08-14 NOTE — Progress Notes (Signed)
[facility] Team contacted patient to assess for food insecurity and other psychosocial needs during current COVID19 pandemic.  Unable to reach, unable to leave VM as mailbox was not set up.  Beverely Pace, [place]

## 2018-08-18 ENCOUNTER — Inpatient Hospital Stay: Payer: Medicaid Other

## 2018-08-18 ENCOUNTER — Inpatient Hospital Stay: Payer: Medicaid Other | Attending: Hematology

## 2018-08-18 ENCOUNTER — Other Ambulatory Visit: Payer: Self-pay

## 2018-08-18 VITALS — BP 124/86 | HR 59 | Temp 97.8°F | Resp 16

## 2018-08-18 DIAGNOSIS — C786 Secondary malignant neoplasm of retroperitoneum and peritoneum: Secondary | ICD-10-CM | POA: Insufficient documentation

## 2018-08-18 DIAGNOSIS — D509 Iron deficiency anemia, unspecified: Secondary | ICD-10-CM | POA: Insufficient documentation

## 2018-08-18 DIAGNOSIS — Z903 Acquired absence of stomach [part of]: Secondary | ICD-10-CM

## 2018-08-18 DIAGNOSIS — D6859 Other primary thrombophilia: Secondary | ICD-10-CM | POA: Insufficient documentation

## 2018-08-18 DIAGNOSIS — M4854XA Collapsed vertebra, not elsewhere classified, thoracic region, initial encounter for fracture: Secondary | ICD-10-CM | POA: Diagnosis not present

## 2018-08-18 DIAGNOSIS — G620 Drug-induced polyneuropathy: Secondary | ICD-10-CM | POA: Diagnosis not present

## 2018-08-18 DIAGNOSIS — Z86718 Personal history of other venous thrombosis and embolism: Secondary | ICD-10-CM | POA: Insufficient documentation

## 2018-08-18 DIAGNOSIS — Z980 Intestinal bypass and anastomosis status: Secondary | ICD-10-CM

## 2018-08-18 DIAGNOSIS — T451X5D Adverse effect of antineoplastic and immunosuppressive drugs, subsequent encounter: Secondary | ICD-10-CM | POA: Insufficient documentation

## 2018-08-18 DIAGNOSIS — Z5111 Encounter for antineoplastic chemotherapy: Secondary | ICD-10-CM | POA: Insufficient documentation

## 2018-08-18 DIAGNOSIS — Z5112 Encounter for antineoplastic immunotherapy: Secondary | ICD-10-CM | POA: Insufficient documentation

## 2018-08-18 DIAGNOSIS — Z95828 Presence of other vascular implants and grafts: Secondary | ICD-10-CM

## 2018-08-18 DIAGNOSIS — I10 Essential (primary) hypertension: Secondary | ICD-10-CM | POA: Diagnosis not present

## 2018-08-18 DIAGNOSIS — Z79899 Other long term (current) drug therapy: Secondary | ICD-10-CM | POA: Insufficient documentation

## 2018-08-18 DIAGNOSIS — Z7901 Long term (current) use of anticoagulants: Secondary | ICD-10-CM | POA: Diagnosis not present

## 2018-08-18 DIAGNOSIS — C161 Malignant neoplasm of fundus of stomach: Secondary | ICD-10-CM | POA: Insufficient documentation

## 2018-08-18 DIAGNOSIS — E538 Deficiency of other specified B group vitamins: Secondary | ICD-10-CM | POA: Insufficient documentation

## 2018-08-18 DIAGNOSIS — Z934 Other artificial openings of gastrointestinal tract status: Secondary | ICD-10-CM

## 2018-08-18 DIAGNOSIS — C7800 Secondary malignant neoplasm of unspecified lung: Secondary | ICD-10-CM | POA: Diagnosis not present

## 2018-08-18 DIAGNOSIS — D500 Iron deficiency anemia secondary to blood loss (chronic): Secondary | ICD-10-CM

## 2018-08-18 DIAGNOSIS — E876 Hypokalemia: Secondary | ICD-10-CM | POA: Diagnosis not present

## 2018-08-18 LAB — FERRITIN: Ferritin: 266 ng/mL (ref 24–336)

## 2018-08-18 LAB — CMP (CANCER CENTER ONLY)
ALT: 11 U/L (ref 0–44)
AST: 16 U/L (ref 15–41)
Albumin: 2.8 g/dL — ABNORMAL LOW (ref 3.5–5.0)
Alkaline Phosphatase: 53 U/L (ref 38–126)
Anion gap: 7 (ref 5–15)
BUN: 6 mg/dL (ref 6–20)
CO2: 28 mmol/L (ref 22–32)
Calcium: 7.9 mg/dL — ABNORMAL LOW (ref 8.9–10.3)
Chloride: 102 mmol/L (ref 98–111)
Creatinine: 0.65 mg/dL (ref 0.61–1.24)
GFR, Est AFR Am: >60 mL/min (ref >60)
GFR, Estimated: >60 mL/min (ref >60)
Glucose, Bld: 81 mg/dL (ref 70–99)
Potassium: 3.7 mmol/L (ref 3.5–5.1)
Sodium: 137 mmol/L (ref 135–145)
Total Bilirubin: 0.4 mg/dL (ref 0.3–1.2)
Total Protein: 5.4 g/dL — ABNORMAL LOW (ref 6.5–8.1)

## 2018-08-18 LAB — CBC WITH DIFFERENTIAL (CANCER CENTER ONLY)
Abs Immature Granulocytes: 0.02 10*3/uL (ref 0.00–0.07)
Basophils Absolute: 0 10*3/uL (ref 0.0–0.1)
Basophils Relative: 1 %
Eosinophils Absolute: 0 10*3/uL (ref 0.0–0.5)
Eosinophils Relative: 0 %
HCT: 28.6 % — ABNORMAL LOW (ref 39.0–52.0)
Hemoglobin: 9.8 g/dL — ABNORMAL LOW (ref 13.0–17.0)
Immature Granulocytes: 1 %
Lymphocytes Relative: 63 %
Lymphs Abs: 2.8 10*3/uL (ref 0.7–4.0)
MCH: 28.9 pg (ref 26.0–34.0)
MCHC: 34.3 g/dL (ref 30.0–36.0)
MCV: 84.4 fL (ref 80.0–100.0)
Monocytes Absolute: 0.3 10*3/uL (ref 0.1–1.0)
Monocytes Relative: 6 %
Neutro Abs: 1.3 10*3/uL — ABNORMAL LOW (ref 1.7–7.7)
Neutrophils Relative %: 29 %
Platelet Count: 283 10*3/uL (ref 150–400)
RBC: 3.39 MIL/uL — ABNORMAL LOW (ref 4.22–5.81)
RDW: 19.8 % — ABNORMAL HIGH (ref 11.5–15.5)
WBC Count: 4.4 10*3/uL (ref 4.0–10.5)
nRBC: 0 % (ref 0.0–0.2)

## 2018-08-18 LAB — IRON AND TIBC
Iron: 25 ug/dL — ABNORMAL LOW (ref 42–163)
Saturation Ratios: 18 % — ABNORMAL LOW (ref 20–55)
TIBC: 139 ug/dL — ABNORMAL LOW (ref 202–409)
UIBC: 113 ug/dL — ABNORMAL LOW (ref 117–376)

## 2018-08-18 LAB — CEA (IN HOUSE-CHCC): CEA (CHCC-In House): 95.25 ng/mL — ABNORMAL HIGH (ref 0.00–5.00)

## 2018-08-18 MED ORDER — SODIUM CHLORIDE 0.9 % IV SOLN
Freq: Once | INTRAVENOUS | Status: DC
  Filled 2018-08-18: qty 250

## 2018-08-18 MED ORDER — ALTEPLASE 2 MG IJ SOLR
2.0000 mg | Freq: Once | INTRAMUSCULAR | Status: DC | PRN
  Filled 2018-08-18: qty 2

## 2018-08-18 MED ORDER — CYANOCOBALAMIN 1000 MCG/ML IJ SOLN
1000.0000 ug | Freq: Once | INTRAMUSCULAR | Status: AC
  Administered 2018-08-18: 1000 ug via INTRAMUSCULAR

## 2018-08-18 MED ORDER — SODIUM CHLORIDE 0.9 % IV SOLN
20.0000 mg | Freq: Once | INTRAVENOUS | Status: AC
  Administered 2018-08-18: 11:00:00 20 mg via INTRAVENOUS
  Filled 2018-08-18: qty 2

## 2018-08-18 MED ORDER — SODIUM CHLORIDE 0.9 % IV SOLN
80.0000 mg/m2 | Freq: Once | INTRAVENOUS | Status: AC
  Administered 2018-08-18: 150 mg via INTRAVENOUS
  Filled 2018-08-18: qty 25

## 2018-08-18 MED ORDER — DIPHENHYDRAMINE HCL 50 MG/ML IJ SOLN
INTRAMUSCULAR | Status: AC
  Filled 2018-08-18: qty 1

## 2018-08-18 MED ORDER — SODIUM CHLORIDE 0.9 % IV SOLN
Freq: Once | INTRAVENOUS | Status: AC
  Administered 2018-08-18: 11:00:00 via INTRAVENOUS
  Filled 2018-08-18: qty 250

## 2018-08-18 MED ORDER — SODIUM CHLORIDE 0.9% FLUSH
10.0000 mL | INTRAVENOUS | Status: DC | PRN
  Administered 2018-08-18: 10 mL
  Filled 2018-08-18: qty 10

## 2018-08-18 MED ORDER — DIPHENHYDRAMINE HCL 50 MG/ML IJ SOLN
50.0000 mg | Freq: Once | INTRAMUSCULAR | Status: AC
  Administered 2018-08-18: 50 mg via INTRAVENOUS

## 2018-08-18 MED ORDER — PALONOSETRON HCL INJECTION 0.25 MG/5ML
INTRAVENOUS | Status: AC
  Filled 2018-08-18: qty 5

## 2018-08-18 MED ORDER — SODIUM CHLORIDE 0.9 % IV SOLN
170.0000 mg | Freq: Once | INTRAVENOUS | Status: AC
  Administered 2018-08-18: 170 mg via INTRAVENOUS
  Filled 2018-08-18: qty 17

## 2018-08-18 MED ORDER — SODIUM CHLORIDE 0.9% FLUSH
10.0000 mL | Freq: Once | INTRAVENOUS | Status: AC
  Administered 2018-08-18: 09:00:00 10 mL
  Filled 2018-08-18: qty 10

## 2018-08-18 MED ORDER — FAMOTIDINE IN NACL 20-0.9 MG/50ML-% IV SOLN: 20.0000 mg | Freq: Once | INTRAVENOUS | Status: DC

## 2018-08-18 MED ORDER — DEXAMETHASONE SODIUM PHOSPHATE 10 MG/ML IJ SOLN
5.0000 mg | Freq: Once | INTRAMUSCULAR | Status: AC
  Administered 2018-08-18: 5 mg via INTRAVENOUS

## 2018-08-18 MED ORDER — HEPARIN SOD (PORK) LOCK FLUSH 100 UNIT/ML IV SOLN
500.0000 [IU] | Freq: Once | INTRAVENOUS | Status: AC | PRN
  Administered 2018-08-18: 500 [IU]
  Filled 2018-08-18: qty 5

## 2018-08-18 MED ORDER — CYANOCOBALAMIN 1000 MCG/ML IJ SOLN
INTRAMUSCULAR | Status: AC
  Filled 2018-08-18: qty 1

## 2018-08-18 MED ORDER — DEXAMETHASONE SODIUM PHOSPHATE 10 MG/ML IJ SOLN
INTRAMUSCULAR | Status: AC
  Filled 2018-08-18: qty 1

## 2018-08-18 MED ORDER — PALONOSETRON HCL INJECTION 0.25 MG/5ML
0.2500 mg | Freq: Once | INTRAVENOUS | Status: AC
  Administered 2018-08-18: 0.25 mg via INTRAVENOUS

## 2018-08-18 NOTE — Progress Notes (Signed)
Okay to treat with [place] level today per Dr. Burr Medico.

## 2018-08-18 NOTE — Patient Instructions (Signed)
[facility] Discharge Instructions for Patients Receiving Chemotherapy  Today you received the following chemotherapy agents: Taxol and Carboplatin.   To help prevent nausea and vomiting after your treatment, we encourage you to take your nausea medication as directed.    If you develop nausea and vomiting that is not controlled by your nausea medication, call the clinic.   BELOW ARE SYMPTOMS THAT SHOULD BE REPORTED IMMEDIATELY:  *FEVER GREATER THAN 100.5 F  *CHILLS WITH OR WITHOUT FEVER  NAUSEA AND VOMITING THAT IS NOT CONTROLLED WITH YOUR NAUSEA MEDICATION  *UNUSUAL SHORTNESS OF BREATH  *UNUSUAL BRUISING OR BLEEDING  TENDERNESS IN MOUTH AND THROAT WITH OR WITHOUT PRESENCE OF ULCERS  *URINARY PROBLEMS  *BOWEL PROBLEMS  UNUSUAL RASH Items with * indicate a potential emergency and should be followed up as soon as possible.  Feel free to call the clinic you have any questions or concerns. The clinic phone number is [phone].    

## 2018-08-22 NOTE — Progress Notes (Signed)
[place]   [phone] [phone]   Clinic Follow up Note   Patient Care Team: Lanae Boast, [place] as PCP - General (Family Medicine)  Date of Service:  08/25/2018  CHIEF COMPLAINT: F/u of gastric cancer  SUMMARY OF ONCOLOGIC HISTORY: Oncology History   Cancer Staging Gastric cancer [facility]) Staging form: Stomach, AJCC 8th Edition - Clinical stage from 06/04/2017: Stage IVB (cT4, cN0, cM1) - Signed by [name], MD on 06/10/2017 - Pathologic stage from 02/25/2018: Stage III (ypT3, pN3a, cM0) - Signed by [name], MD on 04/01/2018       Gastric cancer ([place])   06/03/2017 - 06/06/2017 Hospital Admission    Admitted to the hospital on 06/03/17 with complaints of fatigue and melena. During his stay he underwent an endoscopy that revealed a malignant gastric tumor in the cardia and in the gastric fundus. Biopsy results revealed adenocarcinoma. Pt was discharged on 06/06/17.    06/04/2017 Initial Biopsy    Diagnosis 06/04/17 Stomach, biopsy, Fundus - ADENOCARCINOMA - SEE COMMENT Microscopic Comment The stomach biopsy is of an adenocarcinoma arising in a background of intestinal metaplasia. A Warthin-Starry stain is performed to determine the possibility of the presence of Helicobacter pylori. The Warthin-Starry stain is negative for organisms morphologically consistent with Helicobacter pylori.    06/04/2017 Procedure    Esophagogastroduodenoscopy 06/04/17 by Dr. Benson Norway Impression: Normal esophagus. Malignant gastric tumor in the cardia and in the  gastric fundus. Biopsied. Normal examined duodenum.    06/04/2017 Miscellaneous    HER2 (-) EBV (-) MSI-S PD-L1 CPS 5%    06/05/2017 Imaging    CT CAP W Contrast 06/05/17 IMPRESSION: 9.1 cm fungating mass in the gastric cardia/posterior gastric fundus, corresponding to the patient's newly diagnosed gastric Cancer. Associated extra gastric extension with peritoneal disease in the left upper abdomen,  including a dominant 4.2 cm peritoneal implant. Small volume pelvic ascites. 1.8 cm hypoenhancing lesion along the inferior spleen, indeterminate.    06/10/2017 Initial Diagnosis    Gastric cancer ([place])    06/26/2017 - 01/22/2018 Chemotherapy    First line chemo FOLFOX every 2 weeks     08/29/2017 Imaging    IMPRESSION: 1. Interval decrease in size of fungating ulcerative mass arising from the posterior gastric fundus. 2. Left peritoneal implant, compatible with peritoneal carcinomatosis, has decreased in size from the previous exam. 3. Right lobe of liver lesion present on previous exam is less conspicuous on today's study. A new lesion is identified within the left lobe of liver, suspicious for metastasis. Additional focus of low attenuation within segment 5 adjacent to the gallbladder fossa may represent focal fatty deposition. 4. Similar appearance of mildly complex cyst containing mural calcification and possible internal septation arising from the inferior pole of right kidney. More definitive characterization of this lesion with renal protocol MRI may be helpful. 5.  Mild prostate gland enlargement. 6.  Aortic Atherosclerosis (ICD10-I70.0).    11/25/2017 Imaging    11/25/2017 CT AP W Contrast IMPRESSION: 1. Mixed appearance, with the gastric fundal mass thicker than it was on 08/29/2017 (although still improved compared to 06/05/2017), but with reduced local conglomerate adenopathy along the splenic hilum compared to 08/29/2017. 2. At this time I do not see any definite hepatic metastatic lesions. Several lesions of suspicion in the left hepatic lobe and inferiorly in the right hepatic lobe are not readily visible today. There is a small focus of hypodensity adjacent to the gallbladder fossa in segment 7 which is technically nonspecific but  which could reflect focal fatty infiltration. 3. Increase in nodular contour of scarring in the left lower lobe. This is probably incidental but  I cannot completely exclude the possibility of a localized metastatic lesion. Surveillance of the left lower lobe suggested. 4. Bosniak category 4F cyst of the right kidney lower pole with internal septation and calcification. Surveillance of this lesion is suggested; I suspect that the patient's cancer surveillance will supersede the typical recommended surveillance schedule for complex cyst. 5. Other imaging findings of potential clinical significance: Aortic Atherosclerosis (ICD10-I70.0). Degenerative glenohumeral arthropathy bilaterally. Mild impingement at L4-5 and L5-S1.    12/23/2017 Imaging    12/23/2017 MRI Abdomen IMPRESSION: Ill-defined soft tissue mass in the gastric fundus shows no significant change, consistent known gastric adenocarcinoma.  No definite liver metastases identified. Hemosiderosis noted, with focal area of sparing seen in the right hepatic lobe which accounts for the lesions seen on recent CT.  Stable 1.9 cm probably benign Bosniak category 2 F cystic lesion in right kidney. Recommend continued attention on follow-up imaging.    02/12/2018 Imaging    02/12/2018 CT CAP IMPRESSION: 1. Roughly similar appearance of the mass involving the gastric cardia/fundus. 2. Hepatic steatosis with some areas of fatty sparing as shown on MRI. No definite focal metastatic lesion to the liver is identified. 3. Nonspecific 0.8 cm lymph node between the tail the pancreas in the splenic hilum. There is also a mildly enlarged but stable right hilar lymph node at 1.0 cm in diameter, significance uncertain. 4. Overall stable appearance of the 1.9 cm Bosniak category 4F cyst of the right kidney lower pole. We have now documented 9 months of relative stability. Surveillance at or before 6 months time is recommended. 5. Other imaging findings of potential clinical significance: Aortic Atherosclerosis (ICD10-I70.0). Small anterior pericardial effusion. Stable scarring in the  left lower lobe. Medial hypoenhancement of the upper spleen is probably from early phase of contrast, less likely to be splenic infarct given the relatively normal appearance on delayed images. Impingement at L4-5 and L5-S1.    02/25/2018 Surgery    On 02/25/2018, he had a diagnostic laparoscopy, liver biopsy, total gastrectomy with esophagojejunostomy en bloc with distal pancreatectomy/splenectomy, feeding jejunostomy tube with Dr. Barry Dienes.     02/25/2018 Pathology Results    02/25/2018 Surgical Pathology Diagnosis 1. Soft tissue, biopsy, Diaphragmatic Nodule - NEGATIVE FOR CARCINOMA. 2. Liver, biopsy - LIVER PARENCHYMA, NEGATIVE FOR CARCINOMA. 3. Liver, biopsy, Right - LIVER PARENCHYMA WITH CAPSULAR FIBROSIS. - NEGATIVE FOR CARCINOMA. 4. Soft tissue, biopsy, Falciform Nodule - LIVER PARENCHYMA WITH FIBROTIC BANDS. SEE NOTE. - NEGATIVE FOR CARCINOMA. 5. Omentum, resection for tumor - BENIGN PERITONEUM-LINED ADIPOSE TISSUE. - NEGATIVE FOR CARCINOMA. 6. Stomach, resection for tumor, distal pancreas and spleen - INVASIVE MODERATELY TO POORLY DIFFERENTIATED GASTRIC CARCINOMA, 7 CM. - CARCINOMA INVADES INTO PERIGASTRIC SOFT TISSUE AND IS ADHERENT TO PANCREAS AND SPLEEN BUT DOES NOT INVADE INTO PANCREATIC OR SPLENIC PARENCHYMA. - NO EVIDENCE OF LYMPHOVASCULAR OR PERINEURAL INVASION. - MINIMAL THERAPEUTIC RESPONSE (TUMOR REGRESSION SCORE 3). - METASTATIC CARCINOMA TO EIGHT OF THIRTY-SEVEN LYMPH NODES (8/37). - ALL RESECTION MARGINS ARE NEGATIVE FOR CARCINOMA. - BENIGN UNREMARKABLE PANCREAS. - SEE ONCOLOGY TABLE.    02/25/2018 Cancer Staging    Staging form: Stomach, AJCC 8th Edition - Pathologic stage from 02/25/2018: Stage III (ypT3, pN3a, cM0) - Signed by [name], MD on 04/01/2018    06/05/2018 Imaging    CT CAP W COntrast 06/05/18  IMPRESSION: 1. Interval gastrectomy with esophagojejunostomy in the  lower chest. Splenectomy and partial pancreatectomy. 2. At least 35 new  pulmonary nodules are present, measuring up to 1.5 cm in diameter, high suspicion for metastatic disease. 3. Portacaval and porta hepatis adenopathy, suspicious for malignancy. 4. Although I do not see a definite liver tumor, sensitivity for small lesions is mildly adversely affected due to the late arterial phase of contrast administration. 5. New thoracic spine compression fractures at T6, T7, T2, T3, and T4 as detailed above. New lower sternal manubrial fracture. 6. Right inguinal hernia containing a loop of small bowel, no overt obstruction or strangulation currently evident. 7. Diffuse mild subcutaneous and mesenteric edema, cause uncertain. 8. Other imaging findings of potential clinical significance: Aortic Atherosclerosis (ICD10-I70.0). Lower lumbar spondylosis and degenerative disc disease.    06/16/2018 -  Chemotherapy    On second line chemo weekly taxol andCyramzaq2weeks starting 06/16/2018. Added weekly Carboplatin with cycle 3.       CURRENT THERAPY:  On second line chemo weekly taxol andCyramza q2weeksstarting 06/16/2018, add on carboplatin AUC 1.5 weekly on 07/29/2018  INTERVAL HISTORY:  Ryan Collins is here for a follow up of and treatment. He presents to the clinic today by himself. He notes he is doing moderately well. He notes he eats once in the morning and maybe in the evening. He does not snack in between. He notes he does not have much of an appetite with loss of taste. He has been drinking ensure in the morning. He only has 3 left and would need more. He has medicaid. He notes he is able to still function on his own and be active. He denies back pain or abdominal pain.      REVIEW OF SYSTEMS:   Constitutional: Denies fevers, chills or abnormal weight loss (+) Loss of taste, low appetite (+) weight loss  Eyes: Denies blurriness of vision Ears, nose, mouth, throat, and face: Denies mucositis or sore throat Respiratory: Denies cough, dyspnea or wheezes  Cardiovascular: Denies palpitation, chest discomfort or lower extremity swelling Gastrointestinal:  Denies nausea, heartburn or change in bowel habits Skin: Denies abnormal skin rashes Lymphatics: Denies new lymphadenopathy or easy bruising Neurological:Denies numbness, tingling or new weaknesses Behavioral/Psych: Mood is stable, no new changes  All other systems were reviewed with the patient and are negative.  MEDICAL HISTORY:  Past Medical History:  Diagnosis Date  . Cancer [facility])    gastric cancer  . DVT (deep venous thrombosis) ([place]) 2011  . GI bleeding 2001  . History of DVT of lower extremity   . Hypertension   . Melena 05/2017  . Stomach ulcer    Clinically suspected; No EGD confirmation as of 06/03/17    SURGICAL HISTORY: Past Surgical History:  Procedure Laterality Date  . ESOPHAGOGASTRODUODENOSCOPY  06/04/2017  . ESOPHAGOGASTRODUODENOSCOPY N/A 06/04/2017   Procedure: ESOPHAGOGASTRODUODENOSCOPY (EGD);  Surgeon: Carol Ada, MD;  Location: [place];  Service: Endoscopy;  Laterality: N/A;  . GASTRECTOMY N/A 02/25/2018   Procedure: TOTAL GASTRECTOMY WITH J TUBE PLACEMENT;  Surgeon: [name], MD;  Location: [place];  Service: General;  Laterality: N/A;  . IR FLUORO GUIDE PORT INSERTION RIGHT  06/20/2017  . IR US GUIDE VASC ACCESS RIGHT  06/20/2017  . JEJUNOSTOMY N/A 02/25/2018   Procedure: JEJUNOSTOMY TUBE;  Surgeon: [name], MD;  Location: [place];  Service: General;  Laterality: N/A;  . LAPAROSCOPY N/A 02/25/2018   Procedure: LAPAROSCOPY DIAGNOSTIC;  Surgeon: [name], MD;  Location: [place];  Service: General;  Laterality: N/A;  . SPLENECTOMY, TOTAL  N/A 02/25/2018   Procedure: SPLENECTOMY;  Surgeon: [name], MD;  Location: [place];  Service: General;  Laterality: N/A;    I have reviewed the social history and family history with the patient and they are unchanged from previous note.  ALLERGIES:  has No Known Allergies.  MEDICATIONS:  Current  Outpatient Medications  Medication Sig Dispense Refill  . acetaminophen (TYLENOL) 500 MG tablet Take 1,000 mg by mouth every 6 (six) hours as needed for mild pain.     . Amino Acids-Protein Hydrolys (FEEDING SUPPLEMENT, PRO-STAT SUGAR FREE 64,) LIQD Place 30 mLs into feeding tube 3 (three) times daily. 900 mL 0  . amLODipine (NORVASC) 2.5 MG tablet Take 1 tablet (2.5 mg total) by mouth daily. 90 tablet 3  . cyclobenzaprine (FLEXERIL) 10 MG tablet Take 1 tablet (10 mg total) by mouth 3 (three) times daily as needed for muscle spasms. 30 tablet 1  . lidocaine-prilocaine (EMLA) cream Apply 1 application topically as needed (for port). 30 g 2  . magic mouthwash w/lidocaine SOLN Take 5 mLs by mouth 4 (four) times daily as needed for mouth pain. 240 mL 2  . ondansetron (ZOFRAN) 8 MG tablet Take 8 mg by mouth every 8 (eight) hours as needed.    . potassium chloride SA (K-DUR,KLOR-CON) 20 MEQ tablet Take 1 tablet (20 mEq total) by mouth 2 (two) times daily. 60 tablet 2  . prochlorperazine (COMPAZINE) 10 MG tablet Take 10 mg by mouth every 6 (six) hours as needed.    . vitamin B-12 (CYANOCOBALAMIN) 1000 MCG tablet Take 1 tablet (1,000 mcg total) by mouth daily. 30 tablet 0   No current facility-administered medications for this visit.    Facility-Administered Medications Ordered in Other Visits  Medication Dose Route Frequency Provider Last Rate Last Dose  . 0.9 %  sodium chloride infusion   Intravenous Once [name], MD      . heparin lock flush 100 unit/mL  500 Units Intracatheter Once PRN [name], MD      . potassium chloride SA (K-DUR,KLOR-CON) CR tablet 40 mEq  40 mEq Oral BID [name], MD   40 mEq at 07/29/18 1343  . sodium chloride flush (NS) 0.9 % injection 10 mL  10 mL Intracatheter PRN [name], MD   10 mL at 07/29/18 1707  . sodium chloride flush (NS) 0.9 % injection 10 mL  10 mL Intracatheter PRN [name], MD        PHYSICAL EXAMINATION: ECOG PERFORMANCE STATUS: 1 - Symptomatic but  completely ambulatory  Vitals:   08/25/18 1030  BP: (!) 134/96  Pulse: 82  Resp: 18  Temp: 98.3 F (36.8 C)  SpO2: 100%   Filed Weights   08/25/18 1030  Weight: 125 lb 1.6 oz (56.7 kg)    GENERAL:alert, no distress and comfortable SKIN: skin color, texture, turgor are normal, no rashes or significant lesions EYES: normal, Conjunctiva are pink and non-injected, sclera clear OROPHARYNX:no exudate, no erythema and lips, buccal mucosa, and tongue normal  NECK: supple, thyroid normal size, non-tender, without nodularity LYMPH:  no palpable lymphadenopathy in the cervical, axillary or inguinal LUNGS: clear to auscultation and percussion with normal breathing effort HEART: regular rate & rhythm and no murmurs and no lower extremity edema ABDOMEN:abdomen soft, non-tender and normal bowel sounds Musculoskeletal:no cyanosis of digits and no clubbing  NEURO: alert & oriented x 3 with fluent speech, no focal motor/sensory deficits  LABORATORY DATA:  I have reviewed the data as listed CBC  Latest Ref Rng & Units 08/25/2018 08/18/2018 08/11/2018  WBC 4.0 - 10.5 K/uL 3.5(L) 4.4 4.2  Hemoglobin 13.0 - 17.0 g/dL 10.4(L) 9.8(L) 10.8(L)  Hematocrit 39.0 - 52.0 % 31.1(L) 28.6(L) 31.5(L)  Platelets 150 - 400 K/uL 317 283 345     CMP Latest Ref Rng & Units 08/25/2018 08/18/2018 08/11/2018  Glucose 70 - 99 mg/dL 81 81 82  BUN 6 - 20 mg/dL 5(L) 6 7  Creatinine 0.61 - 1.24 mg/dL 0.65 0.65 0.67  Sodium 135 - 145 mmol/L 138 137 144  Potassium 3.5 - 5.1 mmol/L 3.9 3.7 4.2  Chloride 98 - 111 mmol/L 104 102 110  CO2 22 - 32 mmol/L _0 Calcium 8.9 - 10.3 mg/dL 8.0(L) 7.9(L) 8.1(L)  Total Protein 6.5 - 8.1 g/dL 5.6(L) 5.4(L) 5.9(L)  Total Bilirubin 0.3 - 1.2 mg/dL 0.3 0.4 0.5  Alkaline Phos 38 - 126 U/L 54 53 65  AST 15 - 41 U/L _1 ALT 0 - 44 U/L _2 RADIOGRAPHIC STUDIES: I have personally reviewed the radiological images as listed and agreed with the findings in the report. No  results found.   ASSESSMENT & PLAN:  Kierre Hintz is a 58 y.o. male with   1. Gastric cancer, T4bNxM1 with presumedperitoneal metastasis, adenocarcinoma, HER2(-), MSI-stable, PD-L1positive (5%), ypT3N3aM0, lung metastasis in 05/2018 -He was diagnosed in 05/2017.Initial staging scan showed a large mass in LUQ,presumably peritoneal metastasis. He underwent several months of FOLFOX, followed bytotal gastrectomywith esophagojejunostomy en bloc with distal pancreatectomy/splenectomywith J-tube in placein 02/2018 -Due to his prolonged neoadjuvant chemotherapywith FOLOX, I do not think he would benefit more from adjuvant chemo.Due to the D2 resection, adjuvant radiation wasalso not recommended -Unfortunately he developed lung metastasis in Jan 2020 -I have started him on second line chemo weekly taxol andCyramzastarting 06/16/2018. Added Carboplatin on 07/29/18. He is tolerating well overall.  -With taste change and low appetite, he continues to lose weight. I strongly encouraged him to increase protein and calorie with ensure if he cannot by food. I discussed he will need to control his weight in order to continue chemo. He understands.  -Labs reviewed, CBC stable with WBC 3.5, Hg 10.4, ANC 1.1, CMP unremarkable. Overall adequate to proceed with D15 Taxol, carboand Cyramza today.  -His recent CEA continued to increase. Will scan him in 1-2 weeks for restaging.  -F/u in 2 weeks   2. Anemia of iron deficiency and tumor bleeding, B12 deficiency -He has clinical GI bleeding with melena, iron study also showed iron deficiency. -He has received blood transfusion, and3dose of IV Ferahemepreviously, responded well. -takesOral-B complex -Hg at10.4 today (08/25/18)   3. History of DVT, ? Protein C deficiency -He was on coumadin for his DVT before, and previous labs showed a protein C deficiency, which increases his risk of thrombosis. -We previously discussed the high risk of thrombosis  secondary to his underlying malignancy. -he is not on anyphylactic anticoagulation.  4. History of alcohol and marijuana abuse -He has quit drinking alcohol since his diagnosis of cancer -Hestilldoes smoke marijuana occasionally  5. Weight loss and malnutrition  -He has lost significant weight since the diagnosisand surgery -His J-tube fell outincidentally in December 2019 -Will continue to f/uwith ourdietitian, she encouraged him to take Ensure TID -His weight continues to decline. He is not able to afford Ensure. He is on Medicaid, I will give prescription of ensure.   6. Peripheral neuropathy, grade 1, second to chemotherapy  -  He has neuropathy on his fingers and toes, mainly numbness. Currently manageable  -Will monitor, If worsens will start him on Gabapentin.   7. Abdominal pain  -Secondary to surgery  -He completed his prescription of oxycodone given by Dr. Barry Dienes.  -Due to his previous substance abuse,I will be cautious about narcotics -takes flexeril,Tylenol and Alkazaltzer. He has not needed flexeril lately.  -Currently resolved.  8. Thoracic spine compression fracture, Osteoporosis, hypocalcemia.  -I have discussed histhoracicspineMRIfindings with pt, no evidence of bone mets  -hispain resolved. -I encouraged him to not bend over to pick up objects, but to bend at the knee -Based on criteria he meets osteoporosis. I recommended he start OTC calcium with Vitamin D to help strengthen his pain.  -I also disused bisphosphonate to strengthen his bones. He prefers oral medication. I will prescribe him to Fosamax to start 2 weeks after starting calcium.  -I discussed side effects of Fosamax such as jaw necrosis. I strongly encouraged to manage his dental care and f/u with dentist when he can.    9. HTN -His Amlodipine was recently dose reduced to 2.'5mg'$  daily according to patient. -I discussed his chemo can increase BP, will monitor. May increase amlodipine  again if uncontrolled. -BP at 134/96 today (08/25/18)  10. Hypokalemia -on oral KCL 1-2 times daily   Plan -I gave prescription for ensure, calcium and vitD and Fosamax to start in 2 weeks -Labs reviewed and adequate to proceed [id number] Taxol, Cyramzaand Carboplatin today, off chemo next week  -lab, flush, f/u and treatment in 2 weeks  -CT CAP with contrast in 1-2 weeks at GI    No problem-specific Assessment & Plan notes found for this encounter.   Orders Placed This Encounter  Procedures  . CT Abdomen Pelvis W Contrast    Standing Status:   Future    Standing Expiration Date:   08/25/2019    Order Specific Question:   If indicated for the ordered procedure, I authorize the administration of contrast media per Radiology protocol    Answer:   Yes    Order Specific Question:   Preferred imaging location?    Answer:   [facility]    Order Specific Question:   Is Oral Contrast requested for this exam?    Answer:   Yes, Per Radiology protocol    Order Specific Question:   Radiology Contrast Protocol - do NOT remove file path    Answer:   \\[email or web address]  . CT Chest W Contrast    Standing Status:   Future    Standing Expiration Date:   08/25/2019    Order Specific Question:   If indicated for the ordered procedure, I authorize the administration of contrast media per Radiology protocol    Answer:   Yes    Order Specific Question:   Preferred imaging location?    Answer:   [facility]    Order Specific Question:   Radiology Contrast Protocol - do NOT remove file path    Answer:   \\[email or web address]   All questions were answered. The patient knows to call the clinic with any problems, questions or concerns. No barriers to learning was detected. I spent 20 minutes counseling the patient face to face. The total time spent in the appointment was 25 minutes and more than 50% was on counseling and review of test  results     [name], MD 08/25/2018   I, Joslyn Devon, am acting as scribe for [name], MD.  I have reviewed the above documentation for accuracy and completeness, and I agree with the above.       

## 2018-08-25 ENCOUNTER — Other Ambulatory Visit: Payer: Self-pay

## 2018-08-25 ENCOUNTER — Telehealth: Payer: Self-pay | Admitting: Hematology

## 2018-08-25 ENCOUNTER — Encounter: Payer: Self-pay | Admitting: Hematology

## 2018-08-25 ENCOUNTER — Inpatient Hospital Stay (HOSPITAL_BASED_OUTPATIENT_CLINIC_OR_DEPARTMENT_OTHER): Payer: Medicaid Other | Admitting: Hematology

## 2018-08-25 ENCOUNTER — Other Ambulatory Visit: Payer: Medicaid Other

## 2018-08-25 ENCOUNTER — Inpatient Hospital Stay: Payer: Medicaid Other

## 2018-08-25 VITALS — BP 134/96 | HR 82 | Temp 98.3°F | Resp 18 | Ht 71.0 in | Wt 125.1 lb

## 2018-08-25 VITALS — BP 129/94

## 2018-08-25 DIAGNOSIS — Z95828 Presence of other vascular implants and grafts: Secondary | ICD-10-CM

## 2018-08-25 DIAGNOSIS — D509 Iron deficiency anemia, unspecified: Secondary | ICD-10-CM

## 2018-08-25 DIAGNOSIS — Z934 Other artificial openings of gastrointestinal tract status: Secondary | ICD-10-CM

## 2018-08-25 DIAGNOSIS — D500 Iron deficiency anemia secondary to blood loss (chronic): Secondary | ICD-10-CM

## 2018-08-25 DIAGNOSIS — E538 Deficiency of other specified B group vitamins: Secondary | ICD-10-CM

## 2018-08-25 DIAGNOSIS — Z86718 Personal history of other venous thrombosis and embolism: Secondary | ICD-10-CM

## 2018-08-25 DIAGNOSIS — C161 Malignant neoplasm of fundus of stomach: Secondary | ICD-10-CM

## 2018-08-25 DIAGNOSIS — C786 Secondary malignant neoplasm of retroperitoneum and peritoneum: Secondary | ICD-10-CM | POA: Diagnosis not present

## 2018-08-25 DIAGNOSIS — Z5112 Encounter for antineoplastic immunotherapy: Secondary | ICD-10-CM | POA: Diagnosis not present

## 2018-08-25 DIAGNOSIS — G620 Drug-induced polyneuropathy: Secondary | ICD-10-CM

## 2018-08-25 DIAGNOSIS — C7800 Secondary malignant neoplasm of unspecified lung: Secondary | ICD-10-CM | POA: Diagnosis not present

## 2018-08-25 DIAGNOSIS — Z980 Intestinal bypass and anastomosis status: Secondary | ICD-10-CM

## 2018-08-25 DIAGNOSIS — Z903 Acquired absence of stomach [part of]: Secondary | ICD-10-CM

## 2018-08-25 DIAGNOSIS — I10 Essential (primary) hypertension: Secondary | ICD-10-CM

## 2018-08-25 DIAGNOSIS — D6859 Other primary thrombophilia: Secondary | ICD-10-CM

## 2018-08-25 DIAGNOSIS — Z7901 Long term (current) use of anticoagulants: Secondary | ICD-10-CM

## 2018-08-25 DIAGNOSIS — Z79899 Other long term (current) drug therapy: Secondary | ICD-10-CM

## 2018-08-25 DIAGNOSIS — M4854XA Collapsed vertebra, not elsewhere classified, thoracic region, initial encounter for fracture: Secondary | ICD-10-CM

## 2018-08-25 DIAGNOSIS — E876 Hypokalemia: Secondary | ICD-10-CM

## 2018-08-25 LAB — CMP (CANCER CENTER ONLY)
ALT: 8 U/L (ref 0–44)
AST: 15 U/L (ref 15–41)
Albumin: 2.9 g/dL — ABNORMAL LOW (ref 3.5–5.0)
Alkaline Phosphatase: 54 U/L (ref 38–126)
Anion gap: 9 (ref 5–15)
BUN: 5 mg/dL — ABNORMAL LOW (ref 6–20)
CO2: 25 mmol/L (ref 22–32)
Calcium: 8 mg/dL — ABNORMAL LOW (ref 8.9–10.3)
Chloride: 104 mmol/L (ref 98–111)
Creatinine: 0.65 mg/dL (ref 0.61–1.24)
GFR, Est AFR Am: >60 mL/min (ref >60)
GFR, Estimated: >60 mL/min (ref >60)
Glucose, Bld: 81 mg/dL (ref 70–99)
Potassium: 3.9 mmol/L (ref 3.5–5.1)
Sodium: 138 mmol/L (ref 135–145)
Total Bilirubin: 0.3 mg/dL (ref 0.3–1.2)
Total Protein: 5.6 g/dL — ABNORMAL LOW (ref 6.5–8.1)

## 2018-08-25 LAB — CBC WITH DIFFERENTIAL (CANCER CENTER ONLY)
Abs Immature Granulocytes: 0.02 10*3/uL (ref 0.00–0.07)
Basophils Absolute: 0 10*3/uL (ref 0.0–0.1)
Basophils Relative: 1 %
Eosinophils Absolute: 0 10*3/uL (ref 0.0–0.5)
Eosinophils Relative: 0 %
HCT: 31.1 % — ABNORMAL LOW (ref 39.0–52.0)
Hemoglobin: 10.4 g/dL — ABNORMAL LOW (ref 13.0–17.0)
Immature Granulocytes: 1 %
Lymphocytes Relative: 62 %
Lymphs Abs: 2.2 10*3/uL (ref 0.7–4.0)
MCH: 28.7 pg (ref 26.0–34.0)
MCHC: 33.4 g/dL (ref 30.0–36.0)
MCV: 85.9 fL (ref 80.0–100.0)
Monocytes Absolute: 0.2 10*3/uL (ref 0.1–1.0)
Monocytes Relative: 6 %
Neutro Abs: 1.1 10*3/uL — ABNORMAL LOW (ref 1.7–7.7)
Neutrophils Relative %: 30 %
Platelet Count: 317 10*3/uL (ref 150–400)
RBC: 3.62 MIL/uL — ABNORMAL LOW (ref 4.22–5.81)
RDW: 20.5 % — ABNORMAL HIGH (ref 11.5–15.5)
WBC Count: 3.5 10*3/uL — ABNORMAL LOW (ref 4.0–10.5)
nRBC: 0.6 % — ABNORMAL HIGH (ref 0.0–0.2)

## 2018-08-25 MED ORDER — SODIUM CHLORIDE 0.9 % IV SOLN
Freq: Once | INTRAVENOUS | Status: AC
  Administered 2018-08-25: 11:00:00 via INTRAVENOUS
  Filled 2018-08-25: qty 250

## 2018-08-25 MED ORDER — PALONOSETRON HCL INJECTION 0.25 MG/5ML
INTRAVENOUS | Status: AC
  Filled 2018-08-25: qty 5

## 2018-08-25 MED ORDER — SODIUM CHLORIDE 0.9 % IV SOLN
20.0000 mg | Freq: Once | INTRAVENOUS | Status: AC
  Administered 2018-08-25: 20 mg via INTRAVENOUS
  Filled 2018-08-25: qty 2

## 2018-08-25 MED ORDER — PALONOSETRON HCL INJECTION 0.25 MG/5ML
0.2500 mg | Freq: Once | INTRAVENOUS | Status: AC
  Administered 2018-08-25: 0.25 mg via INTRAVENOUS

## 2018-08-25 MED ORDER — DIPHENHYDRAMINE HCL 50 MG/ML IJ SOLN
50.0000 mg | Freq: Once | INTRAMUSCULAR | Status: AC
  Administered 2018-08-25: 50 mg via INTRAVENOUS

## 2018-08-25 MED ORDER — SODIUM CHLORIDE 0.9% FLUSH
10.0000 mL | Freq: Once | INTRAVENOUS | Status: AC
  Administered 2018-08-25: 10 mL
  Filled 2018-08-25: qty 10

## 2018-08-25 MED ORDER — DEXAMETHASONE SODIUM PHOSPHATE 10 MG/ML IJ SOLN
INTRAMUSCULAR | Status: AC
  Filled 2018-08-25: qty 1

## 2018-08-25 MED ORDER — SODIUM CHLORIDE 0.9 % IV SOLN
8.0000 mg/kg | Freq: Once | INTRAVENOUS | Status: AC
  Administered 2018-08-25: 500 mg via INTRAVENOUS
  Filled 2018-08-25: qty 50

## 2018-08-25 MED ORDER — SODIUM CHLORIDE 0.9% FLUSH
10.0000 mL | INTRAVENOUS | Status: DC | PRN
  Administered 2018-08-25: 10 mL
  Filled 2018-08-25: qty 10

## 2018-08-25 MED ORDER — SODIUM CHLORIDE 0.9 % IV SOLN: 80.0000 mg/m2 | Freq: Once | INTRAVENOUS | Status: DC

## 2018-08-25 MED ORDER — FAMOTIDINE IN NACL 20-0.9 MG/50ML-% IV SOLN: 20.0000 mg | Freq: Once | INTRAVENOUS | Status: DC

## 2018-08-25 MED ORDER — DIPHENHYDRAMINE HCL 50 MG/ML IJ SOLN
INTRAMUSCULAR | Status: AC
  Filled 2018-08-25: qty 1

## 2018-08-25 MED ORDER — SODIUM CHLORIDE 0.9 % IV SOLN
170.0000 mg | Freq: Once | INTRAVENOUS | Status: AC
  Administered 2018-08-25: 170 mg via INTRAVENOUS
  Filled 2018-08-25: qty 17

## 2018-08-25 MED ORDER — SODIUM CHLORIDE 0.9 % IV SOLN
80.0000 mg/m2 | Freq: Once | INTRAVENOUS | Status: AC
  Administered 2018-08-25: 138 mg via INTRAVENOUS
  Filled 2018-08-25: qty 23

## 2018-08-25 MED ORDER — ACETAMINOPHEN 325 MG PO TABS
650.0000 mg | ORAL_TABLET | Freq: Once | ORAL | Status: AC
  Administered 2018-08-25: 650 mg via ORAL

## 2018-08-25 MED ORDER — HEPARIN SOD (PORK) LOCK FLUSH 100 UNIT/ML IV SOLN
500.0000 [IU] | Freq: Once | INTRAVENOUS | Status: AC | PRN
  Administered 2018-08-25: 500 [IU]
  Filled 2018-08-25: qty 5

## 2018-08-25 MED ORDER — DEXAMETHASONE SODIUM PHOSPHATE 10 MG/ML IJ SOLN
5.0000 mg | Freq: Once | INTRAMUSCULAR | Status: AC
  Administered 2018-08-25: 5 mg via INTRAVENOUS

## 2018-08-25 MED ORDER — ACETAMINOPHEN 325 MG PO TABS
ORAL_TABLET | ORAL | Status: AC
  Filled 2018-08-25: qty 2

## 2018-08-25 NOTE — Progress Notes (Signed)
Per Dr Burr Medico OK to proceed with treatment with ANC of 1.1.  Today is day 15 in cycle and he is off chemo next week.

## 2018-08-25 NOTE — Progress Notes (Signed)
Spoke w/ Dr. Burr Medico today, update paclitaxel dose for weight loss today and for future doses. Orders updated.   Demetrius Charity, PharmD, [facility] Pharmac[facility] Pharmacy Phone: [phone] 08/25/2018

## 2018-08-25 NOTE — Telephone Encounter (Signed)
Scheduled appt per 4/13 los. °

## 2018-08-25 NOTE — Patient Instructions (Signed)
[facility] Discharge Instructions for Patients Receiving Chemotherapy  Today you received the following chemotherapy agents:  Cyramza, Taxol, Carboplatin  To help prevent nausea and vomiting after your treatment, we encourage you to take your nausea medication as prescribed.   If you develop nausea and vomiting that is not controlled by your nausea medication, call the clinic.   BELOW ARE SYMPTOMS THAT SHOULD BE REPORTED IMMEDIATELY:  *FEVER GREATER THAN 100.5 F  *CHILLS WITH OR WITHOUT FEVER  NAUSEA AND VOMITING THAT IS NOT CONTROLLED WITH YOUR NAUSEA MEDICATION  *UNUSUAL SHORTNESS OF BREATH  *UNUSUAL BRUISING OR BLEEDING  TENDERNESS IN MOUTH AND THROAT WITH OR WITHOUT PRESENCE OF ULCERS  *URINARY PROBLEMS  *BOWEL PROBLEMS  UNUSUAL RASH Items with * indicate a potential emergency and should be followed up as soon as possible.  Feel free to call the clinic should you have any questions or concerns. The clinic phone number is [phone].  Please show the [facility] at check-in to the Emergency Department and triage nurse.   Neutropenia Neutropenia is a condition that occurs when you have a lower-than-normal level of a type of white blood cell (neutrophil) in your body. Neutrophils are made in the spongy center of large bones (bone marrow) and they fight infections. Neutrophils are your body's main defense against bacterial and fungal infections. The fewer neutrophils you have and the longer your body remains without them, the greater your risk of getting a severe infection. What are the causes? This condition can occur if your body uses up or destroys neutrophils faster than your bone marrow can make them. This problem may happen because of:  Bacterial or fungal infection.  Allergic disorders.  Reactions to some medicines.  Autoimmune disease.  An enlarged spleen. This condition can also occur if your bone marrow does not produce enough  neutrophils. This problem may be caused by:  Cancer.  Cancer treatments, such as radiation or chemotherapy.  Viral infections.  Medicines, such as phenytoin.  Vitamin B12 deficiency.  Diseases of the bone marrow.  Environmental toxins, such as insecticides. What are the signs or symptoms? This condition does not usually cause symptoms. If symptoms are present, they are usually caused by an underlying infection. Symptoms of an infection may include:  Fever.  Chills.  Swollen glands.  Oral or anal ulcers.  Cough and shortness of breath.  Rash.  Skin infection.  Fatigue. How is this diagnosed? Your health care provider may suspect neutropenia if you have:  A condition that may cause neutropenia.  Symptoms of infection, especially fever.  Frequent and unusual infections. You will have a medical history and physical exam. Tests will also be done, such as:  A complete blood count (CBC).  A procedure to collect a sample of bone marrow for examination (bone marrow biopsy).  A chest X-ray.  A urine culture.  A blood culture. How is this treated? Treatment depends on the underlying cause and severity of your condition. Mild neutropenia may not require treatment. Treatment may include medicines, such as:  Antibiotic medicine given through an IV tube.  Antiviral medicines.  Antifungal medicines.  A medicine to increase neutrophil production (colony-stimulating factor). You may get this drug through an IV tube or by injection.  Steroids given through an IV tube. If an underlying condition is causing neutropenia, you may need treatment for that condition. If medicines you are taking are causing neutropenia, your health care provider may have you stop taking those medicines. Follow these  instructions at home: Medicines   Take over-the-counter and prescription medicines only as told by your health care provider.  Get a seasonal flu shot (influenza vaccine).  Lifestyle  Do not eat unpasteurized foods.Do not eat unwashed raw fruits or vegetables.  Avoid exposure to groups of people or children.  Avoid being around people who are sick.  Avoid being around dirt or dust, such as in construction areas or gardens.  Do not provide direct care for pets. Avoid animal droppings. Do not clean litter boxes and bird cages. Hygiene   Bathe daily.  Clean the area between the genitals and the anus (perineal area) after you urinate or have a bowel movement. If you are male, wipe from front to back.  Brush your teeth with a soft toothbrush before and after meals.  Do not use a razor that has a blade. Use an electric razor to remove hair.  Wash your hands often. Make sure others who come in contact with you also wash their hands. If soap and water are not available, use hand sanitizer. General instructions  Do not have sex unless your health care provider has approved.  Take actions to avoid cuts and burns. For example: ? Be cautious when you use knives. Always cut away from yourself. ? Keep knives in protective sheaths or guards when not in use. ? Use oven mitts when you cook with a hot stove, oven, or grill. ? Stand a safe distance away from open fires.  Avoid people who received a vaccine in the past 30 days if that vaccine contained a live version of the germ (live vaccine). You should not get a live vaccine. Common live vaccines are varicella, measles, mumps, and rubella.  Do not share food utensils.  Do not use tampons, enemas, or rectal suppositories unless your health care provider has approved.  Keep all appointments as told by your health care provider. This is important. Contact a health care provider if:  You have a fever.  You have chills or you start to shake.  You have: ? A sore throat. ? A warm, red, or tender area on your skin. ? A cough. ? Frequent or painful urination. ? Vaginal discharge or itching.  You develop:  ? Sores in your mouth or anus. ? Swollen lymph nodes. ? Red streaks on the skin. ? A rash.  You feel: ? Nauseous or you vomit. ? Very fatigued. ? Short of breath. This information is not intended to replace advice given to you by your health care provider. Make sure you discuss any questions you have with your health care provider. Document Released: 10/20/2001 Document Revised: 12/25/2017 Document Reviewed: 11/10/2014 Elsevier Interactive Patient Education  2019 [place] (COVID-19) Are you at risk?  Are you at risk for the Coronavirus (COVID-19)?  To be considered HIGH RISK for Coronavirus (COVID-19), you have to meet the following criteria:  . Traveled to [place], [place], [place], [place] or [place]; or in the [place] to [place], [place], [place], or [place]; and have fever, cough, and shortness of breath within the last 2 weeks of travel OR . Been in close contact with a person diagnosed with COVID-19 within the last 2 weeks and have fever, cough, and shortness of breath . IF YOU DO NOT MEET THESE CRITERIA, YOU ARE CONSIDERED LOW RISK FOR COVID-19.  What to do if you are HIGH RISK for COVID-19?  Marland Kitchen If you are having a medical emergency, call 911. Marland Kitchen  Seek medical care right away. Before you go to a doctor's office, urgent care or emergency department, call ahead and tell them about your recent travel, contact with someone diagnosed with COVID-19, and your symptoms. You should receive instructions from your physician's office regarding next steps of care.  . When you arrive at healthcare provider, tell the healthcare staff immediately you have returned from visiting [place], [place], [place], [place] or [place]; or traveled in the [place] to [place], [place], [place], or [place]; in the last two weeks or you have been in close contact with a person diagnosed with COVID-19 in the last 2 weeks.   . Tell the health care staff about your symptoms:  fever, cough and shortness of breath. . After you have been seen by a medical provider, you will be either: o Tested for (COVID-19) and discharged home on quarantine except to seek medical care if symptoms worsen, and asked to  - Stay home and avoid contact with others until you get your results (4-5 days)  - Avoid travel on public transportation if possible (such as bus, train, or airplane) or o Sent to the Emergency Department by EMS for evaluation, COVID-19 testing, and possible admission depending on your condition and test results.  What to do if you are LOW RISK for COVID-19?  Reduce your risk of any infection by using the same precautions used for avoiding the common cold or flu:  Marland Kitchen Wash your hands often with soap and warm water for at least 20 seconds.  If soap and water are not readily available, use an alcohol-based hand sanitizer with at least 60% alcohol.  . If coughing or sneezing, cover your mouth and nose by coughing or sneezing into the elbow areas of your shirt or coat, into a tissue or into your sleeve (not your hands). . Avoid shaking hands with others and consider head nods or verbal greetings only. . Avoid touching your eyes, nose, or mouth with unwashed hands.  . Avoid close contact with people who are sick. . Avoid places or events with large numbers of people in one location, like concerts or sporting events. . Carefully consider travel plans you have or are making. . If you are planning any travel outside or inside the [place], visit the CDC's Travelers' Health webpage for the latest health notices. . If you have some symptoms but not all symptoms, continue to monitor at home and seek medical attention if your symptoms worsen. . If you are having a medical emergency, call 911.   [place] / e-Visit: [email or web address]         [facility]: [place]  [facility]: [phone]                   [facility]: [phone]

## 2018-08-26 ENCOUNTER — Ambulatory Visit: Payer: Medicaid Other | Admitting: Nutrition

## 2018-08-26 NOTE — Progress Notes (Signed)
RD working remotely.  Nutrition follow up completed with patient on telephone. Weight decreased and documented as 125.1 pounds on April 13. This is down an additional 9 pounds from March 30. He reports he ate a little food today. He is tolerating Ensure and was given a prescription yesterday. Unfortunately, this is considered food and is not covered by insurance. I offered to give patient a complimentary case of ensure enlive. Denies N, V, C, and D.  Nutrition Diagnosis: Food and Nutrition Related Knowledge Deficit continues.  Intervention:  Provide one complimentary case of Ensure Enlive for patient to pick up on Wednesday, April 15. Encouraged patient to consume 4-5 daily as tolerated. Encouraged continued increase oral intake.  Monitoring, Evaluation, Goals: Patient will increase calories and protein to minimize weight loss.  Next Visit: To be scheduled as needed. Patient has my contact information.

## 2018-09-08 ENCOUNTER — Inpatient Hospital Stay: Payer: Medicaid Other | Admitting: Hematology

## 2018-09-08 ENCOUNTER — Telehealth: Payer: Self-pay | Admitting: *Deleted

## 2018-09-08 ENCOUNTER — Other Ambulatory Visit: Payer: Self-pay | Admitting: *Deleted

## 2018-09-08 ENCOUNTER — Inpatient Hospital Stay: Payer: Medicaid Other

## 2018-09-08 ENCOUNTER — Telehealth: Payer: Self-pay | Admitting: Hematology

## 2018-09-08 DIAGNOSIS — C161 Malignant neoplasm of fundus of stomach: Secondary | ICD-10-CM

## 2018-09-08 NOTE — Progress Notes (Signed)
[place]   Telephone:(336) [phone] [phone]) [phone]   Clinic Follow up Note   Patient Care Team: Lanae Boast, [place] as PCP - General (Family Medicine)  Date of Service:  09/10/2018  CHIEF COMPLAINT: F/u of gastric cancer  SUMMARY OF ONCOLOGIC HISTORY: Oncology History   Cancer Staging Gastric cancer [facility]) Staging form: Stomach, AJCC 8th Edition - Clinical stage from 06/04/2017: Stage IVB (cT4, cN0, cM1) - Signed by [name], MD on 06/10/2017 - Pathologic stage from 02/25/2018: Stage III (ypT3, pN3a, cM0) - Signed by [name], MD on 04/01/2018       Gastric cancer ([place])   06/03/2017 - 06/06/2017 Hospital Admission    Admitted to the hospital on 06/03/17 with complaints of fatigue and melena. During his stay he underwent an endoscopy that revealed a malignant gastric tumor in the cardia and in the gastric fundus. Biopsy results revealed adenocarcinoma. Pt was discharged on 06/06/17.    06/04/2017 Initial Biopsy    Diagnosis 06/04/17 Stomach, biopsy, Fundus - ADENOCARCINOMA - SEE COMMENT Microscopic Comment The stomach biopsy is of an adenocarcinoma arising in a background of intestinal metaplasia. A Warthin-Starry stain is performed to determine the possibility of the presence of Helicobacter pylori. The Warthin-Starry stain is negative for organisms morphologically consistent with Helicobacter pylori.    06/04/2017 Procedure    Esophagogastroduodenoscopy 06/04/17 by Dr. Benson Norway Impression: Normal esophagus. Malignant gastric tumor in the cardia and in the  gastric fundus. Biopsied. Normal examined duodenum.    06/04/2017 Miscellaneous    HER2 (-) EBV (-) MSI-S PD-L1 CPS 5%    06/05/2017 Imaging    CT CAP W Contrast 06/05/17 IMPRESSION: 9.1 cm fungating mass in the gastric cardia/posterior gastric fundus, corresponding to the patient's newly diagnosed gastric Cancer. Associated extra gastric extension with peritoneal disease in the left upper abdomen,  including a dominant 4.2 cm peritoneal implant. Small volume pelvic ascites. 1.8 cm hypoenhancing lesion along the inferior spleen, indeterminate.    06/10/2017 Initial Diagnosis    Gastric cancer ([place])    06/26/2017 - 01/22/2018 Chemotherapy    First line chemo FOLFOX every 2 weeks     08/29/2017 Imaging    IMPRESSION: 1. Interval decrease in size of fungating ulcerative mass arising from the posterior gastric fundus. 2. Left peritoneal implant, compatible with peritoneal carcinomatosis, has decreased in size from the previous exam. 3. Right lobe of liver lesion present on previous exam is less conspicuous on today's study. A new lesion is identified within the left lobe of liver, suspicious for metastasis. Additional focus of low attenuation within segment 5 adjacent to the gallbladder fossa may represent focal fatty deposition. 4. Similar appearance of mildly complex cyst containing mural calcification and possible internal septation arising from the inferior pole of right kidney. More definitive characterization of this lesion with renal protocol MRI may be helpful. 5.  Mild prostate gland enlargement. 6.  Aortic Atherosclerosis (ICD10-I70.0).    11/25/2017 Imaging    11/25/2017 CT AP W Contrast IMPRESSION: 1. Mixed appearance, with the gastric fundal mass thicker than it was on 08/29/2017 (although still improved compared to 06/05/2017), but with reduced local conglomerate adenopathy along the splenic hilum compared to 08/29/2017. 2. At this time I do not see any definite hepatic metastatic lesions. Several lesions of suspicion in the left hepatic lobe and inferiorly in the right hepatic lobe are not readily visible today. There is a small focus of hypodensity adjacent to the gallbladder fossa in segment 7 which is technically nonspecific but  which could reflect focal fatty infiltration. 3. Increase in nodular contour of scarring in the left lower lobe. This is probably incidental but  I cannot completely exclude the possibility of a localized metastatic lesion. Surveillance of the left lower lobe suggested. 4. Bosniak category 4F cyst of the right kidney lower pole with internal septation and calcification. Surveillance of this lesion is suggested; I suspect that the patient's cancer surveillance will supersede the typical recommended surveillance schedule for complex cyst. 5. Other imaging findings of potential clinical significance: Aortic Atherosclerosis (ICD10-I70.0). Degenerative glenohumeral arthropathy bilaterally. Mild impingement at L4-5 and L5-S1.    12/23/2017 Imaging    12/23/2017 MRI Abdomen IMPRESSION: Ill-defined soft tissue mass in the gastric fundus shows no significant change, consistent known gastric adenocarcinoma.  No definite liver metastases identified. Hemosiderosis noted, with focal area of sparing seen in the right hepatic lobe which accounts for the lesions seen on recent CT.  Stable 1.9 cm probably benign Bosniak category 2 F cystic lesion in right kidney. Recommend continued attention on follow-up imaging.    02/12/2018 Imaging    02/12/2018 CT CAP IMPRESSION: 1. Roughly similar appearance of the mass involving the gastric cardia/fundus. 2. Hepatic steatosis with some areas of fatty sparing as shown on MRI. No definite focal metastatic lesion to the liver is identified. 3. Nonspecific 0.8 cm lymph node between the tail the pancreas in the splenic hilum. There is also a mildly enlarged but stable right hilar lymph node at 1.0 cm in diameter, significance uncertain. 4. Overall stable appearance of the 1.9 cm Bosniak category 4F cyst of the right kidney lower pole. We have now documented 9 months of relative stability. Surveillance at or before 6 months time is recommended. 5. Other imaging findings of potential clinical significance: Aortic Atherosclerosis (ICD10-I70.0). Small anterior pericardial effusion. Stable scarring in the  left lower lobe. Medial hypoenhancement of the upper spleen is probably from early phase of contrast, less likely to be splenic infarct given the relatively normal appearance on delayed images. Impingement at L4-5 and L5-S1.    02/25/2018 Surgery    On 02/25/2018, he had a diagnostic laparoscopy, liver biopsy, total gastrectomy with esophagojejunostomy en bloc with distal pancreatectomy/splenectomy, feeding jejunostomy tube with Dr. Barry Dienes.     02/25/2018 Pathology Results    02/25/2018 Surgical Pathology Diagnosis 1. Soft tissue, biopsy, Diaphragmatic Nodule - NEGATIVE FOR CARCINOMA. 2. Liver, biopsy - LIVER PARENCHYMA, NEGATIVE FOR CARCINOMA. 3. Liver, biopsy, Right - LIVER PARENCHYMA WITH CAPSULAR FIBROSIS. - NEGATIVE FOR CARCINOMA. 4. Soft tissue, biopsy, Falciform Nodule - LIVER PARENCHYMA WITH FIBROTIC BANDS. SEE NOTE. - NEGATIVE FOR CARCINOMA. 5. Omentum, resection for tumor - BENIGN PERITONEUM-LINED ADIPOSE TISSUE. - NEGATIVE FOR CARCINOMA. 6. Stomach, resection for tumor, distal pancreas and spleen - INVASIVE MODERATELY TO POORLY DIFFERENTIATED GASTRIC CARCINOMA, 7 CM. - CARCINOMA INVADES INTO PERIGASTRIC SOFT TISSUE AND IS ADHERENT TO PANCREAS AND SPLEEN BUT DOES NOT INVADE INTO PANCREATIC OR SPLENIC PARENCHYMA. - NO EVIDENCE OF LYMPHOVASCULAR OR PERINEURAL INVASION. - MINIMAL THERAPEUTIC RESPONSE (TUMOR REGRESSION SCORE 3). - METASTATIC CARCINOMA TO EIGHT OF THIRTY-SEVEN LYMPH NODES (8/37). - ALL RESECTION MARGINS ARE NEGATIVE FOR CARCINOMA. - BENIGN UNREMARKABLE PANCREAS. - SEE ONCOLOGY TABLE.    02/25/2018 Cancer Staging    Staging form: Stomach, AJCC 8th Edition - Pathologic stage from 02/25/2018: Stage III (ypT3, pN3a, cM0) - Signed by [name], MD on 04/01/2018    06/05/2018 Imaging    CT CAP W COntrast 06/05/18  IMPRESSION: 1. Interval gastrectomy with esophagojejunostomy in the  lower chest. Splenectomy and partial pancreatectomy. 2. At least 35 new  pulmonary nodules are present, measuring up to 1.5 cm in diameter, high suspicion for metastatic disease. 3. Portacaval and porta hepatis adenopathy, suspicious for malignancy. 4. Although I do not see a definite liver tumor, sensitivity for small lesions is mildly adversely affected due to the late arterial phase of contrast administration. 5. New thoracic spine compression fractures at T6, T7, T2, T3, and T4 as detailed above. New lower sternal manubrial fracture. 6. Right inguinal hernia containing a loop of small bowel, no overt obstruction or strangulation currently evident. 7. Diffuse mild subcutaneous and mesenteric edema, cause uncertain. 8. Other imaging findings of potential clinical significance: Aortic Atherosclerosis (ICD10-I70.0). Lower lumbar spondylosis and degenerative disc disease.    06/16/2018 -  Chemotherapy    On second line chemo weekly taxol andCyramzaq2weeks starting 06/16/2018. Added weekly Carboplatin with cycle 3.       CURRENT THERAPY:  On second line chemo weekly taxol andCyramzaq2weeksstarting 06/16/2018, add on carboplatin AUC 1.5 weekly on 07/29/2018. Due to neutropenia, I will reduce chemo to Day 1 and day 15 starting 09/10/18.   INTERVAL HISTORY:  Ryan Collins is here for a follow up and treatment. He presents to the clinic today by himself. He notes he is doing well. He notes his energy level is decreased. He notes he can take care of himself and mostly is up most of the day. He notes he will eat 2 meals a day. He has been drinking ensure 4 a day. He denies pain except for when he eats big meals. He also denies nausea. He notes when he blows his nose he will see blood. He denies mouth sores or mouth pain.  He notes feeling mild pain on his back and has been taking flexeril as needed. He is out. I reviewed his medication list. He would also like to refill BP medication. He notes he is taking oral potassium.     REVIEW OF SYSTEMS:   Constitutional:  Denies fevers, chills or abnormal weight loss (+) Low energy  Eyes: Denies blurriness of vision Ears, nose, mouth, throat, and face: Denies mucositis or sore throat (+) Mild blood with blowing nose Respiratory: Denies cough, dyspnea or wheezes Cardiovascular: Denies palpitation, chest discomfort or lower extremity swelling Gastrointestinal:  Denies nausea, heartburn or change in bowel habits Skin: Denies abnormal skin rashes Lymphatics: Denies new lymphadenopathy or easy bruising Neurological:Denies numbness, tingling or new weaknesses Behavioral/Psych: Mood is stable, no new changes  All other systems were reviewed with the patient and are negative.  MEDICAL HISTORY:  Past Medical History:  Diagnosis Date  . Cancer [facility])    gastric cancer  . DVT (deep venous thrombosis) ([place]) 2011  . GI bleeding 2001  . History of DVT of lower extremity   . Hypertension   . Melena 05/2017  . Stomach ulcer    Clinically suspected; No EGD confirmation as of 06/03/17    SURGICAL HISTORY: Past Surgical History:  Procedure Laterality Date  . ESOPHAGOGASTRODUODENOSCOPY  06/04/2017  . ESOPHAGOGASTRODUODENOSCOPY N/A 06/04/2017   Procedure: ESOPHAGOGASTRODUODENOSCOPY (EGD);  Surgeon: Carol Ada, MD;  Location: [place];  Service: Endoscopy;  Laterality: N/A;  . GASTRECTOMY N/A 02/25/2018   Procedure: TOTAL GASTRECTOMY WITH J TUBE PLACEMENT;  Surgeon: [name], MD;  Location: [place];  Service: General;  Laterality: N/A;  . IR FLUORO GUIDE PORT INSERTION RIGHT  06/20/2017  . IR US GUIDE VASC ACCESS RIGHT  06/20/2017  . JEJUNOSTOMY N/A 02/25/2018  Procedure: JEJUNOSTOMY TUBE;  Surgeon: [name], MD;  Location: [place];  Service: General;  Laterality: N/A;  . LAPAROSCOPY N/A 02/25/2018   Procedure: LAPAROSCOPY DIAGNOSTIC;  Surgeon: [name], MD;  Location: [place];  Service: General;  Laterality: N/A;  . SPLENECTOMY, TOTAL N/A 02/25/2018   Procedure: SPLENECTOMY;  Surgeon: [name], MD;   Location: [place];  Service: General;  Laterality: N/A;    I have reviewed the social history and family history with the patient and they are unchanged from previous note.  ALLERGIES:  has No Known Allergies.  MEDICATIONS:  Current Outpatient Medications  Medication Sig Dispense Refill  . acetaminophen (TYLENOL) 500 MG tablet Take 1,000 mg by mouth every 6 (six) hours as needed for mild pain.     . Amino Acids-Protein Hydrolys (FEEDING SUPPLEMENT, PRO-STAT SUGAR FREE 64,) LIQD Place 30 mLs into feeding tube 3 (three) times daily. 900 mL 0  . amLODipine (NORVASC) 2.5 MG tablet Take 1 tablet (2.5 mg total) by mouth daily. 90 tablet 3  . cyclobenzaprine (FLEXERIL) 10 MG tablet Take 1 tablet (10 mg total) by mouth 3 (three) times daily as needed for muscle spasms. 30 tablet 1  . lidocaine-prilocaine (EMLA) cream Apply 1 application topically as needed (for port). 30 g 2  . magic mouthwash w/lidocaine SOLN Take 5 mLs by mouth 4 (four) times daily as needed for mouth pain. 240 mL 2  . ondansetron (ZOFRAN) 8 MG tablet Take 8 mg by mouth every 8 (eight) hours as needed.    . potassium chloride SA (K-DUR,KLOR-CON) 20 MEQ tablet Take 1 tablet (20 mEq total) by mouth 2 (two) times daily. 60 tablet 2  . prochlorperazine (COMPAZINE) 10 MG tablet Take 10 mg by mouth every 6 (six) hours as needed.    . vitamin B-12 (CYANOCOBALAMIN) 1000 MCG tablet Take 1 tablet (1,000 mcg total) by mouth daily. 30 tablet 0   No current facility-administered medications for this visit.     PHYSICAL EXAMINATION: ECOG PERFORMANCE STATUS: 2 - Symptomatic, <50% confined to bed  Vitals:   09/10/18 0924  BP: (!) 143/94  Pulse: (!) 108  Resp: 18  Temp: 98 F (36.7 C)  SpO2: 96%   Filed Weights   09/10/18 0924  Weight: 130 lb 1.6 oz (59 kg)    GENERAL:alert, no distress and comfortable SKIN: skin color, texture, turgor are normal, no rashes or significant lesions EYES: normal, Conjunctiva are pink and non-injected,  sclera clear OROPHARYNX:no exudate, no erythema and lips, buccal mucosa, and tongue normal  NECK: supple, thyroid normal size, non-tender, without nodularity LYMPH:  no palpable lymphadenopathy in the cervical, axillary or inguinal LUNGS: clear to auscultation and percussion with normal breathing effort HEART: regular rate & rhythm and no murmurs and no lower extremity edema ABDOMEN:abdomen soft, non-tender and normal bowel sounds Musculoskeletal:no cyanosis of digits and no clubbing  NEURO: alert & oriented x 3 with fluent speech, no focal motor/sensory deficits  LABORATORY DATA:  I have reviewed the data as listed CBC Latest Ref Rng & Units 09/10/2018 08/25/2018 08/18/2018  WBC 4.0 - 10.5 K/uL 4.0 3.5(L) 4.4  Hemoglobin 13.0 - 17.0 g/dL 11.2(L) 10.4(L) 9.8(L)  Hematocrit 39.0 - 52.0 % 33.1(L) 31.1(L) 28.6(L)  Platelets 150 - 400 K/uL 332 317 283     CMP Latest Ref Rng & Units 09/10/2018 08/25/2018 08/18/2018  Glucose 70 - 99 mg/dL 80 81 81  BUN 6 - 20 mg/dL 5(L) 5(L) 6  Creatinine 0.61 - 1.24 mg/dL 0.64  0.65 0.65  Sodium 135 - 145 mmol/L 144 138 137  Potassium 3.5 - 5.1 mmol/L 4.2 3.9 3.7  Chloride 98 - 111 mmol/L 109 104 102  CO2 22 - 32 mmol/L _0 Calcium 8.9 - 10.3 mg/dL 7.9(L) 8.0(L) 7.9(L)  Total Protein 6.5 - 8.1 g/dL 5.6(L) 5.6(L) 5.4(L)  Total Bilirubin 0.3 - 1.2 mg/dL 0.3 0.3 0.4  Alkaline Phos 38 - 126 U/L 59 54 53  AST 15 - 41 U/L _1 ALT 0 - 44 U/L _2 RADIOGRAPHIC STUDIES: I have personally reviewed the radiological images as listed and agreed with the findings in the report. No results found.   ASSESSMENT & PLAN:  Ryan Collins is a 58 y.o. male with   1. Gastric cancer, T4bNxM1 with presumedperitoneal metastasis, adenocarcinoma, HER2(-), MSI-stable, PD-L1positive (5%), ypT3N3aM0, lung metastasis in 05/2018 -He was diagnosed in 05/2017.Initial staging scan showed a large mass in LUQ,presumably peritoneal metastasis. He underwent several  months of FOLFOX, followed bytotal gastrectomywith esophagojejunostomy en bloc with distal pancreatectomy/splenectomywith J-tube in placein 02/2018 -Due to his prolonged neoadjuvant chemotherapywith FOLOX, I do not think he would benefit more from adjuvant chemo.Due to the D2 resection, adjuvant radiation wasalso not recommended -Unfortunately he developed lung metastasis in Jan 2020 -I have started him on second line chemo weekly taxol andCyramzastarting 06/16/2018.Added Carboplatin on 07/29/18. He is tolerating well overall.  -Given he is PD-L1 positive, CPS 5, I discussed this makes him eligible for immunotherapy with Keytruda for next-line treatment.  -He will proceed with CT CAP in 1-2 weeks. Based on results we will continue current treatment or switch to [facility]. He is agreeable.  -Labs reviewed, Urine protein and CBC WNL except Hg 11.2, ANC 0.8, CMP WNL. Overall adequate to proceed with C5D1 Taxol and Cyramza today. Given neutropenia, I will hold Carbo today and will reduce chemo taxol, Carbo and Cyramza to every 2 weeks   -F/u in 2 week with NP Lacie   2. Anemia of iron deficiency and tumor bleeding, B12 deficiency -He has clinical GI bleeding with melena, iron study also showed iron deficiency. -He has received blood transfusion, and3dose of IV Ferahemepreviously, responded well. -takesOral-B complex -Hg at11.2today (09/10/18)   3. History of DVT, ? Protein C deficiency -He was on coumadin for his DVT before, and previous labs showed a protein C deficiency, which increases his risk of thrombosis. -We previously discussed the high risk of thrombosis secondary to his underlying malignancy. -he is not on anyphylactic anticoagulation.  4. History of alcohol and marijuana abuse -He has quit drinking alcohol since his diagnosis of cancer -Hestilldoes smoke marijuana occasionally  5. Weight loss and malnutrition  -He has lost significant weight since the  diagnosisand surgery -His J-tube fell outincidentally in December 2019 -Will continue to f/uwith ourdietitian, she encouraged him to take Ensure TID -His weight continues to decline. He is not able to afford Ensure. He is on Medicaid, I will give prescription of ensure.  -He is eating 2 meals a day and taking 4 ensure a day. He has been able to gain weight since last visit.   6. Peripheral neuropathy, grade 1, second to chemotherapy  -He has neuropathy on his fingers and toes,mainly numbness. Currently manageable  -Will monitor, If worsens will start him on Gabapentin.  7. Abdominal pain  -Secondary to surgery  -He completed his prescription of oxycodone given by Dr. Barry Dienes.  -Due to his previous substance abuse,I will be  cautious about narcotics -Takes Tylenol and Alkazaltzer. -Currently resolved.  8. Thoracic spine compression fracture, Osteoporosis, hypocalcemia.  -I have discussed histhoracicspineMRIfindings with pt, no evidence of bone mets  -I encouraged him to not bend over to pick up objects, but to bend at the knee -Based on criteria he meets osteoporosis. I recommended he start OTC calcium with Vitamin D to help strengthen his pain.  -I also disused bisphosphonate to strengthen his bones. He prefers oral medication. Due to his persistent hypocalcemia, he has not started yet -I encourage him to take OTC calcium and vitD -For occasionally back pain he takes Flexeril as needed, I refilled today (09/10/18)   9. HTN -His Amlodipine was recently dose reduced to 2.60m daily according to patient. -I discussed his chemo can increase BP, will monitor. May increase amlodipine again if uncontrolled. -BP at 143/94 today (09/10/18)  10. Hypokalemia -on oral KCL 1-2 times daily   Plan -I refilled Flexeril and amlodipine today  -Labs reviewed and adequate to proceed withC5D1Taxol and Cyramza. Will hold Carboplatintoday -lab, flush, f/u with NP Lacie and treatment  in 2 weeks -CT CAP with contrast in 1-2 weeks   No problem-specific Assessment & Plan notes found for this encounter.   No orders of the defined types were placed in this encounter.  All questions were answered. The patient knows to call the clinic with any problems, questions or concerns. No barriers to learning was detected. I spent 20 minutes counseling the patient face to face. The total time spent in the appointment was 25 minutes and more than 50% was on counseling and review of test results     YTruitt Merle MD 09/10/2018   I, AJoslyn Devon am acting as scribe for YTruitt Merle MD.   I have reviewed the above documentation for accuracy and completeness, and I agree with the above.

## 2018-09-08 NOTE — Telephone Encounter (Signed)
R/s appt per 4/27 sch message - pt is aware of new appt date and time on 4/29

## 2018-09-08 NOTE — Telephone Encounter (Signed)
TCT patient regarding his appts today. Pt did not show for MD appt, labs and treatment. Spoke with patient. He states he forgot about his appts today and does not have transportation. Spoke with Dr. Burr Medico. Appts to be reschueduled to 09/10/18. Also pt needs his scans scheduled as well.  High priority scheduling message sent for appts here and call made to central scheduling for scans.

## 2018-09-10 ENCOUNTER — Other Ambulatory Visit: Payer: Self-pay

## 2018-09-10 ENCOUNTER — Inpatient Hospital Stay: Payer: Medicaid Other

## 2018-09-10 ENCOUNTER — Telehealth: Payer: Self-pay | Admitting: Hematology

## 2018-09-10 ENCOUNTER — Inpatient Hospital Stay (HOSPITAL_BASED_OUTPATIENT_CLINIC_OR_DEPARTMENT_OTHER): Payer: Medicaid Other | Admitting: Hematology

## 2018-09-10 ENCOUNTER — Encounter: Payer: Self-pay | Admitting: Hematology

## 2018-09-10 VITALS — BP 131/89 | HR 60

## 2018-09-10 VITALS — BP 143/94 | HR 108 | Temp 98.0°F | Resp 18 | Ht 71.0 in | Wt 130.1 lb

## 2018-09-10 DIAGNOSIS — D500 Iron deficiency anemia secondary to blood loss (chronic): Secondary | ICD-10-CM

## 2018-09-10 DIAGNOSIS — C7800 Secondary malignant neoplasm of unspecified lung: Secondary | ICD-10-CM

## 2018-09-10 DIAGNOSIS — D509 Iron deficiency anemia, unspecified: Secondary | ICD-10-CM | POA: Diagnosis not present

## 2018-09-10 DIAGNOSIS — Z980 Intestinal bypass and anastomosis status: Secondary | ICD-10-CM

## 2018-09-10 DIAGNOSIS — M546 Pain in thoracic spine: Secondary | ICD-10-CM

## 2018-09-10 DIAGNOSIS — C161 Malignant neoplasm of fundus of stomach: Secondary | ICD-10-CM | POA: Diagnosis not present

## 2018-09-10 DIAGNOSIS — E876 Hypokalemia: Secondary | ICD-10-CM

## 2018-09-10 DIAGNOSIS — M4854XA Collapsed vertebra, not elsewhere classified, thoracic region, initial encounter for fracture: Secondary | ICD-10-CM

## 2018-09-10 DIAGNOSIS — I10 Essential (primary) hypertension: Secondary | ICD-10-CM

## 2018-09-10 DIAGNOSIS — C786 Secondary malignant neoplasm of retroperitoneum and peritoneum: Secondary | ICD-10-CM | POA: Diagnosis not present

## 2018-09-10 DIAGNOSIS — D6859 Other primary thrombophilia: Secondary | ICD-10-CM

## 2018-09-10 DIAGNOSIS — E538 Deficiency of other specified B group vitamins: Secondary | ICD-10-CM

## 2018-09-10 DIAGNOSIS — Z903 Acquired absence of stomach [part of]: Secondary | ICD-10-CM

## 2018-09-10 DIAGNOSIS — Z7901 Long term (current) use of anticoagulants: Secondary | ICD-10-CM

## 2018-09-10 DIAGNOSIS — Z86718 Personal history of other venous thrombosis and embolism: Secondary | ICD-10-CM

## 2018-09-10 DIAGNOSIS — Z79899 Other long term (current) drug therapy: Secondary | ICD-10-CM

## 2018-09-10 DIAGNOSIS — Z95828 Presence of other vascular implants and grafts: Secondary | ICD-10-CM

## 2018-09-10 DIAGNOSIS — G620 Drug-induced polyneuropathy: Secondary | ICD-10-CM

## 2018-09-10 DIAGNOSIS — Z5112 Encounter for antineoplastic immunotherapy: Secondary | ICD-10-CM | POA: Diagnosis not present

## 2018-09-10 DIAGNOSIS — Z934 Other artificial openings of gastrointestinal tract status: Secondary | ICD-10-CM

## 2018-09-10 LAB — CMP (CANCER CENTER ONLY)
ALT: 10 U/L (ref 0–44)
AST: 18 U/L (ref 15–41)
Albumin: 2.8 g/dL — ABNORMAL LOW (ref 3.5–5.0)
Alkaline Phosphatase: 59 U/L (ref 38–126)
Anion gap: 8 (ref 5–15)
BUN: 5 mg/dL — ABNORMAL LOW (ref 6–20)
CO2: 27 mmol/L (ref 22–32)
Calcium: 7.9 mg/dL — ABNORMAL LOW (ref 8.9–10.3)
Chloride: 109 mmol/L (ref 98–111)
Creatinine: 0.64 mg/dL (ref 0.61–1.24)
GFR, Est AFR Am: >60 mL/min (ref >60)
GFR, Estimated: >60 mL/min (ref >60)
Glucose, Bld: 80 mg/dL (ref 70–99)
Potassium: 4.2 mmol/L (ref 3.5–5.1)
Sodium: 144 mmol/L (ref 135–145)
Total Bilirubin: 0.3 mg/dL (ref 0.3–1.2)
Total Protein: 5.6 g/dL — ABNORMAL LOW (ref 6.5–8.1)

## 2018-09-10 LAB — CBC WITH DIFFERENTIAL (CANCER CENTER ONLY)
Abs Immature Granulocytes: 0.02 10*3/uL (ref 0.00–0.07)
Basophils Absolute: 0 10*3/uL (ref 0.0–0.1)
Basophils Relative: 0 %
Eosinophils Absolute: 0 10*3/uL (ref 0.0–0.5)
Eosinophils Relative: 0 %
HCT: 33.1 % — ABNORMAL LOW (ref 39.0–52.0)
Hemoglobin: 11.2 g/dL — ABNORMAL LOW (ref 13.0–17.0)
Immature Granulocytes: 1 %
Lymphocytes Relative: 64 %
Lymphs Abs: 2.5 10*3/uL (ref 0.7–4.0)
MCH: 29.1 pg (ref 26.0–34.0)
MCHC: 33.8 g/dL (ref 30.0–36.0)
MCV: 86 fL (ref 80.0–100.0)
Monocytes Absolute: 0.6 10*3/uL (ref 0.1–1.0)
Monocytes Relative: 16 %
Neutro Abs: 0.8 10*3/uL — ABNORMAL LOW (ref 1.7–7.7)
Neutrophils Relative %: 19 %
Platelet Count: 332 10*3/uL (ref 150–400)
RBC: 3.85 MIL/uL — ABNORMAL LOW (ref 4.22–5.81)
RDW: 22.7 % — ABNORMAL HIGH (ref 11.5–15.5)
WBC Count: 4 10*3/uL (ref 4.0–10.5)
nRBC: 0.5 % — ABNORMAL HIGH (ref 0.0–0.2)

## 2018-09-10 LAB — TOTAL PROTEIN, URINE DIPSTICK: Protein, ur: NEGATIVE mg/dL

## 2018-09-10 MED ORDER — SODIUM CHLORIDE 0.9 % IV SOLN
80.0000 mg/m2 | Freq: Once | INTRAVENOUS | Status: AC
  Administered 2018-09-10: 138 mg via INTRAVENOUS
  Filled 2018-09-10: qty 23

## 2018-09-10 MED ORDER — CYCLOBENZAPRINE HCL 10 MG PO TABS: 10.0000 mg | ORAL_TABLET | Freq: Three times a day (TID) | ORAL | 1 refills | Status: AC | PRN

## 2018-09-10 MED ORDER — ACETAMINOPHEN 325 MG PO TABS
650.0000 mg | ORAL_TABLET | Freq: Once | ORAL | Status: AC
  Administered 2018-09-10: 650 mg via ORAL

## 2018-09-10 MED ORDER — SODIUM CHLORIDE 0.9 % IV SOLN
8.0000 mg/kg | Freq: Once | INTRAVENOUS | Status: AC
  Administered 2018-09-10: 500 mg via INTRAVENOUS
  Filled 2018-09-10: qty 50

## 2018-09-10 MED ORDER — DEXAMETHASONE SODIUM PHOSPHATE 10 MG/ML IJ SOLN
5.0000 mg | Freq: Once | INTRAMUSCULAR | Status: AC
  Administered 2018-09-10: 5 mg via INTRAVENOUS

## 2018-09-10 MED ORDER — FAMOTIDINE IN NACL 20-0.9 MG/50ML-% IV SOLN
INTRAVENOUS | Status: AC
  Filled 2018-09-10: qty 50

## 2018-09-10 MED ORDER — PALONOSETRON HCL INJECTION 0.25 MG/5ML
0.2500 mg | Freq: Once | INTRAVENOUS | Status: AC
  Administered 2018-09-10: 0.25 mg via INTRAVENOUS

## 2018-09-10 MED ORDER — SODIUM CHLORIDE 0.9% FLUSH
10.0000 mL | INTRAVENOUS | Status: DC | PRN
  Administered 2018-09-10: 10 mL
  Filled 2018-09-10: qty 10

## 2018-09-10 MED ORDER — SODIUM CHLORIDE 0.9 % IV SOLN
Freq: Once | INTRAVENOUS | Status: AC
  Administered 2018-09-10: 10:00:00 via INTRAVENOUS
  Filled 2018-09-10: qty 250

## 2018-09-10 MED ORDER — DIPHENHYDRAMINE HCL 50 MG/ML IJ SOLN
INTRAMUSCULAR | Status: AC
  Filled 2018-09-10: qty 1

## 2018-09-10 MED ORDER — ACETAMINOPHEN 325 MG PO TABS
ORAL_TABLET | ORAL | Status: AC
  Filled 2018-09-10: qty 2

## 2018-09-10 MED ORDER — HEPARIN SOD (PORK) LOCK FLUSH 100 UNIT/ML IV SOLN
500.0000 [IU] | Freq: Once | INTRAVENOUS | Status: AC | PRN
  Administered 2018-09-10: 500 [IU]
  Filled 2018-09-10: qty 5

## 2018-09-10 MED ORDER — PALONOSETRON HCL INJECTION 0.25 MG/5ML
INTRAVENOUS | Status: AC
  Filled 2018-09-10: qty 5

## 2018-09-10 MED ORDER — SODIUM CHLORIDE 0.9% FLUSH
10.0000 mL | INTRAVENOUS | Status: DC | PRN
  Administered 2018-09-10: 10 mL via INTRAVENOUS
  Filled 2018-09-10: qty 10

## 2018-09-10 MED ORDER — AMLODIPINE BESYLATE 2.5 MG PO TABS: 2.5000 mg | ORAL_TABLET | Freq: Every day | ORAL | 3 refills | Status: AC

## 2018-09-10 MED ORDER — FAMOTIDINE IN NACL 20-0.9 MG/50ML-% IV SOLN
20.0000 mg | Freq: Once | INTRAVENOUS | Status: AC
  Administered 2018-09-10: 20 mg via INTRAVENOUS

## 2018-09-10 MED ORDER — DIPHENHYDRAMINE HCL 50 MG/ML IJ SOLN
50.0000 mg | Freq: Once | INTRAMUSCULAR | Status: AC
  Administered 2018-09-10: 50 mg via INTRAVENOUS

## 2018-09-10 MED ORDER — DEXAMETHASONE SODIUM PHOSPHATE 10 MG/ML IJ SOLN
INTRAMUSCULAR | Status: AC
  Filled 2018-09-10: qty 1

## 2018-09-10 NOTE — Progress Notes (Signed)
Ok to treat with ANC 0.8 per Dr. Burr Medico and she is deleting Carbo today and will hold Day 8 and treat again on Day 15.  She will also have a scan done next week per MD.

## 2018-09-10 NOTE — Telephone Encounter (Signed)
Cancelled appts per 4/29 los.

## 2018-09-10 NOTE — Patient Instructions (Signed)
[facility] Discharge Instructions for Patients Receiving Chemotherapy  Today you received the following chemotherapy agents Ramucirumab [facility]) & Paclitaxel (TAXOL).  To help prevent nausea and vomiting after your treatment, we encourage you to take your nausea medication as prescribed.   If you develop nausea and vomiting that is not controlled by your nausea medication, call the clinic.   BELOW ARE SYMPTOMS THAT SHOULD BE REPORTED IMMEDIATELY:  *FEVER GREATER THAN 100.5 F  *CHILLS WITH OR WITHOUT FEVER  NAUSEA AND VOMITING THAT IS NOT CONTROLLED WITH YOUR NAUSEA MEDICATION  *UNUSUAL SHORTNESS OF BREATH  *UNUSUAL BRUISING OR BLEEDING  TENDERNESS IN MOUTH AND THROAT WITH OR WITHOUT PRESENCE OF ULCERS  *URINARY PROBLEMS  *BOWEL PROBLEMS  UNUSUAL RASH Items with * indicate a potential emergency and should be followed up as soon as possible.  Feel free to call the clinic should you have any questions or concerns. The clinic phone number is [phone].  Please show the [facility] at check-in to the Emergency Department and triage nurse.  Coronavirus (COVID-19) Are you at risk?  Are you at risk for the Coronavirus (COVID-19)?  To be considered HIGH RISK for Coronavirus (COVID-19), you have to meet the following criteria:  . Traveled to [place], [place], [place], [place] or [place]; or in the [place] to [place], [place], [place], or [place]; and have fever, cough, and shortness of breath within the last 2 weeks of travel OR . Been in close contact with a person diagnosed with COVID-19 within the last 2 weeks and have fever, cough, and shortness of breath . IF YOU DO NOT MEET THESE CRITERIA, YOU ARE CONSIDERED LOW RISK FOR COVID-19.  What to do if you are HIGH RISK for COVID-19?  Marland Kitchen If you are having a medical emergency, call 911. . Seek medical care right away. Before you go to a doctor's office, urgent care or emergency department, call  ahead and tell them about your recent travel, contact with someone diagnosed with COVID-19, and your symptoms. You should receive instructions from your physician's office regarding next steps of care.  . When you arrive at healthcare provider, tell the healthcare staff immediately you have returned from visiting [place], [place], [place], [place] or [place]; or traveled in the [place] to [place], [place], [place], or [place]; in the last two weeks or you have been in close contact with a person diagnosed with COVID-19 in the last 2 weeks.   . Tell the health care staff about your symptoms: fever, cough and shortness of breath. . After you have been seen by a medical provider, you will be either: o Tested for (COVID-19) and discharged home on quarantine except to seek medical care if symptoms worsen, and asked to  - Stay home and avoid contact with others until you get your results (4-5 days)  - Avoid travel on public transportation if possible (such as bus, train, or airplane) or o Sent to the Emergency Department by EMS for evaluation, COVID-19 testing, and possible admission depending on your condition and test results.  What to do if you are LOW RISK for COVID-19?  Reduce your risk of any infection by using the same precautions used for avoiding the common cold or flu:  Marland Kitchen Wash your hands often with soap and warm water for at least 20 seconds.  If soap and water are not readily available, use an alcohol-based hand sanitizer with at least 60% alcohol.  Marland Kitchen  If coughing or sneezing, cover your mouth and nose by coughing or sneezing into the elbow areas of your shirt or coat, into a tissue or into your sleeve (not your hands). . Avoid shaking hands with others and consider head nods or verbal greetings only. . Avoid touching your eyes, nose, or mouth with unwashed hands.  . Avoid close contact with people who are sick. . Avoid places or events with large numbers of people in one location,  like concerts or sporting events. . Carefully consider travel plans you have or are making. . If you are planning any travel outside or inside the [place], visit the CDC's Travelers' Health webpage for the latest health notices. . If you have some symptoms but not all symptoms, continue to monitor at home and seek medical attention if your symptoms worsen. . If you are having a medical emergency, call 911.   [facility] / e-Visit: [email or web address]         [facility]: [facility]: [phone]                   [facility]: [phone]

## 2018-09-11 ENCOUNTER — Inpatient Hospital Stay: Payer: Medicaid Other | Admitting: Nutrition

## 2018-09-11 NOTE — Progress Notes (Signed)
RD working remotely.  Nutrition follow up completed with patient who is receiving chemotherapy for gastric cancer. Weight increased to 130 pounds on April 29, up from 125.1 pounds on April 13. Reports his ability to eat comes and goes. He is drinking about 4 Ensure Enlive daily. He is almost out of samples. Denies other needs.  Nutrition Diagnosis: Food and Nutrition Related knowledge deficit improved.  Intervention: Educated patient to continue oral nutrition supplements 4 times daily. Will provide a complimentary case of Ensure Enlive for patient to pick up on Friday, May 1.  Monitoring, Evaluation, Goals: Patient will continue to increase calories and protein to promote weight gain.  Next Visit: Monday, May 11.

## 2018-09-15 ENCOUNTER — Other Ambulatory Visit: Payer: Medicaid Other

## 2018-09-15 ENCOUNTER — Ambulatory Visit: Payer: Medicaid Other | Admitting: Nurse Practitioner

## 2018-09-15 ENCOUNTER — Ambulatory Visit: Payer: Medicaid Other

## 2018-09-17 ENCOUNTER — Encounter (HOSPITAL_COMMUNITY): Payer: Self-pay

## 2018-09-17 ENCOUNTER — Other Ambulatory Visit: Payer: Self-pay

## 2018-09-17 ENCOUNTER — Ambulatory Visit (HOSPITAL_COMMUNITY)
Admission: RE | Admit: 2018-09-17 | Discharge: 2018-09-17 | Disposition: A | Discharge status: Home / Self Care | Payer: Medicaid Other | Source: Ambulatory Visit | Attending: Hematology | Admitting: Hematology

## 2018-09-17 DIAGNOSIS — C161 Malignant neoplasm of fundus of stomach: Secondary | ICD-10-CM | POA: Insufficient documentation

## 2018-09-17 MED ORDER — SODIUM CHLORIDE (PF) 0.9 % IJ SOLN
INTRAMUSCULAR | Status: AC
  Filled 2018-09-17: qty 50

## 2018-09-17 MED ORDER — IOHEXOL 300 MG/ML  SOLN
100.0000 mL | Freq: Once | INTRAMUSCULAR | Status: AC | PRN
  Administered 2018-09-17: 100 mL via INTRAVENOUS

## 2018-09-22 ENCOUNTER — Inpatient Hospital Stay: Payer: Medicaid Other | Admitting: Nutrition

## 2018-09-22 ENCOUNTER — Telehealth: Payer: Self-pay | Admitting: Nurse Practitioner

## 2018-09-22 ENCOUNTER — Other Ambulatory Visit: Payer: Self-pay

## 2018-09-22 ENCOUNTER — Inpatient Hospital Stay: Payer: Medicaid Other

## 2018-09-22 ENCOUNTER — Inpatient Hospital Stay: Payer: Medicaid Other | Attending: Hematology

## 2018-09-22 ENCOUNTER — Inpatient Hospital Stay (HOSPITAL_BASED_OUTPATIENT_CLINIC_OR_DEPARTMENT_OTHER): Payer: Medicaid Other | Admitting: Nurse Practitioner

## 2018-09-22 ENCOUNTER — Encounter: Payer: Self-pay | Admitting: Nurse Practitioner

## 2018-09-22 VITALS — BP 82/56 | HR 113 | Temp 98.3°F | Resp 18 | Ht 71.0 in | Wt 113.0 lb

## 2018-09-22 VITALS — BP 129/96 | HR 107

## 2018-09-22 DIAGNOSIS — I951 Orthostatic hypotension: Secondary | ICD-10-CM

## 2018-09-22 DIAGNOSIS — Z934 Other artificial openings of gastrointestinal tract status: Secondary | ICD-10-CM

## 2018-09-22 DIAGNOSIS — E876 Hypokalemia: Secondary | ICD-10-CM

## 2018-09-22 DIAGNOSIS — T451X5D Adverse effect of antineoplastic and immunosuppressive drugs, subsequent encounter: Secondary | ICD-10-CM | POA: Insufficient documentation

## 2018-09-22 DIAGNOSIS — D509 Iron deficiency anemia, unspecified: Secondary | ICD-10-CM

## 2018-09-22 DIAGNOSIS — C161 Malignant neoplasm of fundus of stomach: Secondary | ICD-10-CM | POA: Insufficient documentation

## 2018-09-22 DIAGNOSIS — E860 Dehydration: Secondary | ICD-10-CM

## 2018-09-22 DIAGNOSIS — E538 Deficiency of other specified B group vitamins: Secondary | ICD-10-CM | POA: Diagnosis not present

## 2018-09-22 DIAGNOSIS — Z79899 Other long term (current) drug therapy: Secondary | ICD-10-CM | POA: Diagnosis not present

## 2018-09-22 DIAGNOSIS — E46 Unspecified protein-calorie malnutrition: Secondary | ICD-10-CM | POA: Insufficient documentation

## 2018-09-22 DIAGNOSIS — Z86718 Personal history of other venous thrombosis and embolism: Secondary | ICD-10-CM | POA: Diagnosis not present

## 2018-09-22 DIAGNOSIS — Z903 Acquired absence of stomach [part of]: Secondary | ICD-10-CM

## 2018-09-22 DIAGNOSIS — C786 Secondary malignant neoplasm of retroperitoneum and peritoneum: Secondary | ICD-10-CM | POA: Insufficient documentation

## 2018-09-22 DIAGNOSIS — F1010 Alcohol abuse, uncomplicated: Secondary | ICD-10-CM | POA: Diagnosis not present

## 2018-09-22 DIAGNOSIS — R109 Unspecified abdominal pain: Secondary | ICD-10-CM | POA: Insufficient documentation

## 2018-09-22 DIAGNOSIS — Z95828 Presence of other vascular implants and grafts: Secondary | ICD-10-CM

## 2018-09-22 DIAGNOSIS — D500 Iron deficiency anemia secondary to blood loss (chronic): Secondary | ICD-10-CM

## 2018-09-22 DIAGNOSIS — G620 Drug-induced polyneuropathy: Secondary | ICD-10-CM

## 2018-09-22 DIAGNOSIS — Z5112 Encounter for antineoplastic immunotherapy: Secondary | ICD-10-CM | POA: Insufficient documentation

## 2018-09-22 DIAGNOSIS — C7800 Secondary malignant neoplasm of unspecified lung: Secondary | ICD-10-CM | POA: Insufficient documentation

## 2018-09-22 DIAGNOSIS — R110 Nausea: Secondary | ICD-10-CM

## 2018-09-22 DIAGNOSIS — Z980 Intestinal bypass and anastomosis status: Secondary | ICD-10-CM

## 2018-09-22 LAB — CBC WITH DIFFERENTIAL (CANCER CENTER ONLY)
Abs Immature Granulocytes: 0.01 10*3/uL (ref 0.00–0.07)
Basophils Absolute: 0 10*3/uL (ref 0.0–0.1)
Basophils Relative: 0 %
Eosinophils Absolute: 0 10*3/uL (ref 0.0–0.5)
Eosinophils Relative: 0 %
HCT: 40.9 % (ref 39.0–52.0)
Hemoglobin: 13.7 g/dL (ref 13.0–17.0)
Immature Granulocytes: 0 %
Lymphocytes Relative: 62 %
Lymphs Abs: 2.9 10*3/uL (ref 0.7–4.0)
MCH: 29 pg (ref 26.0–34.0)
MCHC: 33.5 g/dL (ref 30.0–36.0)
MCV: 86.7 fL (ref 80.0–100.0)
Monocytes Absolute: 0.4 10*3/uL (ref 0.1–1.0)
Monocytes Relative: 7 %
Neutro Abs: 1.5 10*3/uL — ABNORMAL LOW (ref 1.7–7.7)
Neutrophils Relative %: 31 %
Platelet Count: 363 10*3/uL (ref 150–400)
RBC: 4.72 MIL/uL (ref 4.22–5.81)
RDW: 22.4 % — ABNORMAL HIGH (ref 11.5–15.5)
WBC Count: 4.8 10*3/uL (ref 4.0–10.5)
nRBC: 0 % (ref 0.0–0.2)

## 2018-09-22 LAB — IRON AND TIBC
Iron: 62 ug/dL (ref 42–163)
Saturation Ratios: 41 % (ref 20–55)
TIBC: 150 ug/dL — ABNORMAL LOW (ref 202–409)
UIBC: 88 ug/dL — ABNORMAL LOW (ref 117–376)

## 2018-09-22 LAB — CMP (CANCER CENTER ONLY)
ALT: 12 U/L (ref 0–44)
AST: 16 U/L (ref 15–41)
Albumin: 3.1 g/dL — ABNORMAL LOW (ref 3.5–5.0)
Alkaline Phosphatase: 70 U/L (ref 38–126)
Anion gap: 10 (ref 5–15)
BUN: 12 mg/dL (ref 6–20)
CO2: 20 mmol/L — ABNORMAL LOW (ref 22–32)
Calcium: 8.8 mg/dL — ABNORMAL LOW (ref 8.9–10.3)
Chloride: 109 mmol/L (ref 98–111)
Creatinine: 0.91 mg/dL (ref 0.61–1.24)
GFR, Est AFR Am: >60 mL/min (ref >60)
GFR, Estimated: >60 mL/min (ref >60)
Glucose, Bld: 127 mg/dL — ABNORMAL HIGH (ref 70–99)
Potassium: 4.8 mmol/L (ref 3.5–5.1)
Sodium: 139 mmol/L (ref 135–145)
Total Bilirubin: 0.4 mg/dL (ref 0.3–1.2)
Total Protein: 6.5 g/dL (ref 6.5–8.1)

## 2018-09-22 LAB — CEA (IN HOUSE-CHCC): CEA (CHCC-In House): 105.58 ng/mL — ABNORMAL HIGH (ref 0.00–5.00)

## 2018-09-22 LAB — FERRITIN: Ferritin: 293 ng/mL (ref 24–336)

## 2018-09-22 MED ORDER — ONDANSETRON HCL 4 MG/2ML IJ SOLN
INTRAMUSCULAR | Status: AC
  Filled 2018-09-22: qty 4

## 2018-09-22 MED ORDER — SODIUM CHLORIDE 0.9 % IV SOLN
INTRAVENOUS | Status: DC
  Administered 2018-09-22: 11:00:00 via INTRAVENOUS
  Filled 2018-09-22 (×2): qty 250

## 2018-09-22 MED ORDER — ACETAMINOPHEN 325 MG PO TABS: 650.0000 mg | ORAL_TABLET | Freq: Once | ORAL | Status: DC

## 2018-09-22 MED ORDER — SODIUM CHLORIDE 0.9% FLUSH
10.0000 mL | Freq: Once | INTRAVENOUS | Status: AC
  Administered 2018-09-22: 10 mL
  Filled 2018-09-22: qty 10

## 2018-09-22 MED ORDER — SODIUM CHLORIDE 0.9 % IV SOLN: 8.0000 mg | Freq: Once | INTRAVENOUS | Status: DC

## 2018-09-22 MED ORDER — ACETAMINOPHEN 325 MG PO TABS
ORAL_TABLET | ORAL | Status: AC
  Filled 2018-09-22: qty 2

## 2018-09-22 MED ORDER — ONDANSETRON HCL 4 MG/2ML IJ SOLN
8.0000 mg | Freq: Once | INTRAMUSCULAR | Status: AC
  Administered 2018-09-22: 8 mg via INTRAVENOUS

## 2018-09-22 MED ORDER — ACETAMINOPHEN 325 MG PO TABS
650.0000 mg | ORAL_TABLET | Freq: Once | ORAL | Status: AC
  Administered 2018-09-22: 12:00:00 650 mg via ORAL

## 2018-09-22 NOTE — Telephone Encounter (Signed)
Scheduled appt per 5/11 los.  Voice mail box not set up, was not able to leave a voice message

## 2018-09-22 NOTE — Progress Notes (Signed)
RD working remotely.  Attempted to call patient but he was not available and has a voice mail box which has not been set up yet. Reviewed chart and noted chemotherapy discontinued secondary to disease progression. Weight decreased from 130.1 pounds April 29 to 113 pounds on May 11. Patient has my contact number if he needs further assistance with nutrition information or samples of Ensure Enlive.

## 2018-09-22 NOTE — Progress Notes (Addendum)
Ryan Collins   Telephone:([phone]) [phone] [phone]   Clinic Follow up Note   Patient Care Team: Lanae Boast, [place] as PCP - General (Family Medicine) 09/22/2018  CHIEF COMPLAINT: f/u gastric cancer   SUMMARY OF ONCOLOGIC HISTORY: Oncology History   Cancer Staging Gastric cancer [facility]) Staging form: Stomach, AJCC 8th Edition - Clinical stage from 06/04/2017: Stage IVB (cT4, cN0, cM1) - Signed by [name], MD on 06/10/2017 - Pathologic stage from 02/25/2018: Stage III (ypT3, pN3a, cM0) - Signed by [name], MD on 04/01/2018       Gastric cancer ([place])   06/03/2017 - 06/06/2017 Hospital Admission    Admitted to the hospital on 06/03/17 with complaints of fatigue and melena. During his stay he underwent an endoscopy that revealed a malignant gastric tumor in the cardia and in the gastric fundus. Biopsy results revealed adenocarcinoma. Pt was discharged on 06/06/17.    06/04/2017 Initial Biopsy    Diagnosis 06/04/17 Stomach, biopsy, Fundus - ADENOCARCINOMA - SEE COMMENT Microscopic Comment The stomach biopsy is of an adenocarcinoma arising in a background of intestinal metaplasia. A Warthin-Starry stain is performed to determine the possibility of the presence of Helicobacter pylori. The Warthin-Starry stain is negative for organisms morphologically consistent with Helicobacter pylori.    06/04/2017 Procedure    Esophagogastroduodenoscopy 06/04/17 by Dr. Benson Norway Impression: Normal esophagus. Malignant gastric tumor in the cardia and in the  gastric fundus. Biopsied. Normal examined duodenum.    06/04/2017 Miscellaneous    HER2 (-) EBV (-) MSI-S PD-L1 CPS 5%    06/05/2017 Imaging    CT CAP W Contrast 06/05/17 IMPRESSION: 9.1 cm fungating mass in the gastric cardia/posterior gastric fundus, corresponding to the patient's newly diagnosed gastric Cancer. Associated extra gastric extension with peritoneal disease in the left upper abdomen, including a dominant 4.2 cm  peritoneal implant. Small volume pelvic ascites. 1.8 cm hypoenhancing lesion along the inferior spleen, indeterminate.    06/10/2017 Initial Diagnosis    Gastric cancer ([place])    06/26/2017 - 01/22/2018 Chemotherapy    First line chemo FOLFOX every 2 weeks     08/29/2017 Imaging    IMPRESSION: 1. Interval decrease in size of fungating ulcerative mass arising from the posterior gastric fundus. 2. Left peritoneal implant, compatible with peritoneal carcinomatosis, has decreased in size from the previous exam. 3. Right lobe of liver lesion present on previous exam is less conspicuous on today's study. A new lesion is identified within the left lobe of liver, suspicious for metastasis. Additional focus of low attenuation within segment 5 adjacent to the gallbladder fossa may represent focal fatty deposition. 4. Similar appearance of mildly complex cyst containing mural calcification and possible internal septation arising from the inferior pole of right kidney. More definitive characterization of this lesion with renal protocol MRI may be helpful. 5.  Mild prostate gland enlargement. 6.  Aortic Atherosclerosis (ICD10-I70.0).    11/25/2017 Imaging    11/25/2017 CT AP W Contrast IMPRESSION: 1. Mixed appearance, with the gastric fundal mass thicker than it was on 08/29/2017 (although still improved compared to 06/05/2017), but with reduced local conglomerate adenopathy along the splenic hilum compared to 08/29/2017. 2. At this time I do not see any definite hepatic metastatic lesions. Several lesions of suspicion in the left hepatic lobe and inferiorly in the right hepatic lobe are not readily visible today. There is a small focus of hypodensity adjacent to the gallbladder fossa in segment 7 which is technically nonspecific but which could reflect focal fatty  infiltration. 3. Increase in nodular contour of scarring in the left lower lobe. This is probably incidental but I cannot completely exclude  the possibility of a localized metastatic lesion. Surveillance of the left lower lobe suggested. 4. Bosniak category 640F cyst of the right kidney lower pole with internal septation and calcification. Surveillance of this lesion is suggested; I suspect that the patient's cancer surveillance will supersede the typical recommended surveillance schedule for complex cyst. 5. Other imaging findings of potential clinical significance: Aortic Atherosclerosis (ICD10-I70.0). Degenerative glenohumeral arthropathy bilaterally. Mild impingement at L4-5 and L5-S1.    12/23/2017 Imaging    12/23/2017 MRI Abdomen IMPRESSION: Ill-defined soft tissue mass in the gastric fundus shows no significant change, consistent known gastric adenocarcinoma.  No definite liver metastases identified. Hemosiderosis noted, with focal area of sparing seen in the right hepatic lobe which accounts for the lesions seen on recent CT.  Stable 1.9 cm probably benign Bosniak category 2 F cystic lesion in right kidney. Recommend continued attention on follow-up imaging.    02/12/2018 Imaging    02/12/2018 CT CAP IMPRESSION: 1. Roughly similar appearance of the mass involving the gastric cardia/fundus. 2. Hepatic steatosis with some areas of fatty sparing as shown on MRI. No definite focal metastatic lesion to the liver is identified. 3. Nonspecific 0.8 cm lymph node between the tail the pancreas in the splenic hilum. There is also a mildly enlarged but stable right hilar lymph node at 1.0 cm in diameter, significance uncertain. 4. Overall stable appearance of the 1.9 cm Bosniak category 640F cyst of the right kidney lower pole. We have now documented 9 months of relative stability. Surveillance at or before 6 months time is recommended. 5. Other imaging findings of potential clinical significance: Aortic Atherosclerosis (ICD10-I70.0). Small anterior pericardial effusion. Stable scarring in the left lower lobe. Medial  hypoenhancement of the upper spleen is probably from early phase of contrast, less likely to be splenic infarct given the relatively normal appearance on delayed images. Impingement at L4-5 and L5-S1.    02/25/2018 Surgery    On 02/25/2018, he had a diagnostic laparoscopy, liver biopsy, total gastrectomy with esophagojejunostomy en bloc with distal pancreatectomy/splenectomy, feeding jejunostomy tube with Dr. Barry Dienes.     02/25/2018 Pathology Results    02/25/2018 Surgical Pathology Diagnosis 1. Soft tissue, biopsy, Diaphragmatic Nodule - NEGATIVE FOR CARCINOMA. 2. Liver, biopsy - LIVER PARENCHYMA, NEGATIVE FOR CARCINOMA. 3. Liver, biopsy, Right - LIVER PARENCHYMA WITH CAPSULAR FIBROSIS. - NEGATIVE FOR CARCINOMA. 4. Soft tissue, biopsy, Falciform Nodule - LIVER PARENCHYMA WITH FIBROTIC BANDS. SEE NOTE. - NEGATIVE FOR CARCINOMA. 5. Omentum, resection for tumor - BENIGN PERITONEUM-LINED ADIPOSE TISSUE. - NEGATIVE FOR CARCINOMA. 6. Stomach, resection for tumor, distal pancreas and spleen - INVASIVE MODERATELY TO POORLY DIFFERENTIATED GASTRIC CARCINOMA, 7 CM. - CARCINOMA INVADES INTO PERIGASTRIC SOFT TISSUE AND IS ADHERENT TO PANCREAS AND SPLEEN BUT DOES NOT INVADE INTO PANCREATIC OR SPLENIC PARENCHYMA. - NO EVIDENCE OF LYMPHOVASCULAR OR PERINEURAL INVASION. - MINIMAL THERAPEUTIC RESPONSE (TUMOR REGRESSION SCORE 3). - METASTATIC CARCINOMA TO EIGHT OF THIRTY-SEVEN LYMPH NODES (8/37). - ALL RESECTION MARGINS ARE NEGATIVE FOR CARCINOMA. - BENIGN UNREMARKABLE PANCREAS. - SEE ONCOLOGY TABLE.    02/25/2018 Cancer Staging    Staging form: Stomach, AJCC 8th Edition - Pathologic stage from 02/25/2018: Stage III (ypT3, pN3a, cM0) - Signed by [name], MD on 04/01/2018    06/05/2018 Imaging    CT CAP W COntrast 06/05/18  IMPRESSION: 1. Interval gastrectomy with esophagojejunostomy in the lower chest. Splenectomy and partial  pancreatectomy. 2. At least 35 new pulmonary nodules are present,  measuring up to 1.5 cm in diameter, high suspicion for metastatic disease. 3. Portacaval and porta hepatis adenopathy, suspicious for malignancy. 4. Although I do not see a definite liver tumor, sensitivity for small lesions is mildly adversely affected due to the late arterial phase of contrast administration. 5. New thoracic spine compression fractures at T6, T7, T2, T3, and T4 as detailed above. New lower sternal manubrial fracture. 6. Right inguinal hernia containing a loop of small bowel, no overt obstruction or strangulation currently evident. 7. Diffuse mild subcutaneous and mesenteric edema, cause uncertain. 8. Other imaging findings of potential clinical significance: Aortic Atherosclerosis (ICD10-I70.0). Lower lumbar spondylosis and degenerative disc disease.    06/16/2018 -  Chemotherapy    On second line chemo weekly taxol andCyramzaq2weeks starting 06/16/2018. Added weekly Carboplatin with cycle 3.     09/17/2018 Imaging    CT CAP IMPRESSION: 1. A 7 mm pulmonary nodule of the right lower lobe as slightly increased in size, measuring 7 mm, previously 3 mm (series 6, image 82). Multiple additional small bilateral pulmonary nodules are minimally increased in size.  2. Slight interval enlargement of a hypodense portacaval lymph node, measuring 3.1 x 2.2 cm, previously 2.6 x 2.0 cm when measured similarly (series 2, image 69).  3.  Findings are consistent with worsening metastatic disease.  4. Unchanged postoperative findings status post gastrectomy, splenectomy, and distal pancreatectomy.     CURRENT THERAPY:  On second line chemo weekly taxol andCyramzaq2weeksstarting 06/16/2018, add on carboplatin AUC 1.5 weekly on 07/29/2018. Due to neutropenia, I will reduce chemo to Day 1 and day 15 starting 09/10/18.  S/p cycle 5 day 1 on 09/10/18 taxol/cyramza only   INTERVAL HISTORY: Ryan Collins returns for follow up and treatment as scheduled. He was last seen on 09/10/18 by Dr.  Burr Medico and completed cycle 5 day 1 taxol/cyramza. Carboplatin was held for neutropenia. He underwent restaging CT CAP on 09/17/18. He did not have much trouble with last cycle until over the last few days he developed dyspnea on exertion, moderate to severe fatigue, and weakness. He could hardly walk to exam room. He has increased abdominal pain feeling like "something is wrapped around him." pain improved after eating. He has not eaten much solid food in the last week due to decreased appetite. He drinks ensure and water. He vomited once 1 day ago when he tried to eat. Has occasional diarrhea with ensure which is not new for him. Denies blood in stool. He has chronic headaches, tylenol helps. He has occasional dizziness when he has a headache. Denies dizziness on standing. His intermittent cough is unchanged. Denies fever, sick/covid contacts, chest pain, or leg swelling.   REVIEW OF SYSTEMS:   Constitutional: Denies fevers, chills (+) abnormal weight loss, 17 lbs since 4/29 (+) fatigue  Eyes: Denies blurriness of vision Ears, nose, mouth, throat, and face: Denies mucositis or sore throat Respiratory: Denies wheezes (+) intermittent cough, unchanged (+) DOE  Cardiovascular: Denies palpitation, chest discomfort or lower extremity swelling Gastrointestinal:  Denies nausea, constipation, heartburn. Denies blood in stool (+) emesis x1 (+) occasional diarrhea  Skin: Denies abnormal skin rashes Lymphatics: Denies new lymphadenopathy or easy bruising Neurological:Denies (+) intermittent headache (+) dizziness with headache  (+) neuropathy, stable (+) generalized weakness x1 week  Behavioral/Psych: Mood is stable, no new changes  All other systems were reviewed with the patient and are negative.  MEDICAL HISTORY:  Past Medical History:  Diagnosis Date  Cancer [facility])    gastric cancer   DVT (deep venous thrombosis) ([place]) 2011   GI bleeding 2001   History of DVT of lower extremity    Hypertension     Melena 05/2017   Stomach ulcer    Clinically suspected; No EGD confirmation as of 06/03/17    SURGICAL HISTORY: Past Surgical History:  Procedure Laterality Date   ESOPHAGOGASTRODUODENOSCOPY  06/04/2017   ESOPHAGOGASTRODUODENOSCOPY N/A 06/04/2017   Procedure: ESOPHAGOGASTRODUODENOSCOPY (EGD);  Surgeon: Carol Ada, MD;  Location: [place];  Service: Endoscopy;  Laterality: N/A;   GASTRECTOMY N/A 02/25/2018   Procedure: TOTAL GASTRECTOMY WITH J TUBE PLACEMENT;  Surgeon: [name], MD;  Location: [place];  Service: General;  Laterality: N/A;   IR FLUORO GUIDE PORT INSERTION RIGHT  06/20/2017   IR US GUIDE VASC ACCESS RIGHT  06/20/2017   JEJUNOSTOMY N/A 02/25/2018   Procedure: JEJUNOSTOMY TUBE;  Surgeon: [name], MD;  Location: [place];  Service: General;  Laterality: N/A;   LAPAROSCOPY N/A 02/25/2018   Procedure: LAPAROSCOPY DIAGNOSTIC;  Surgeon: [name], MD;  Location: [place];  Service: General;  Laterality: N/A;   SPLENECTOMY, TOTAL N/A 02/25/2018   Procedure: SPLENECTOMY;  Surgeon: [name], MD;  Location: [place];  Service: General;  Laterality: N/A;    I have reviewed the social history and family history with the patient and they are unchanged from previous note.  ALLERGIES:  has No Known Allergies.  MEDICATIONS:  Current Outpatient Medications  Medication Sig Dispense Refill   acetaminophen (TYLENOL) 500 MG tablet Take 1,000 mg by mouth every 6 (six) hours as needed for mild pain.      Amino Acids-Protein Hydrolys (FEEDING SUPPLEMENT, PRO-STAT SUGAR FREE 64,) LIQD Place 30 mLs into feeding tube 3 (three) times daily. 900 mL 0   amLODipine (NORVASC) 2.5 MG tablet Take 1 tablet (2.5 mg total) by mouth daily. 90 tablet 3   cyclobenzaprine (FLEXERIL) 10 MG tablet Take 1 tablet (10 mg total) by mouth 3 (three) times daily as needed for muscle spasms. 30 tablet 1   lidocaine-prilocaine (EMLA) cream Apply 1 application topically as needed (for port). 30 g 2    magic mouthwash w/lidocaine SOLN Take 5 mLs by mouth 4 (four) times daily as needed for mouth pain. 240 mL 2   ondansetron (ZOFRAN) 8 MG tablet Take 8 mg by mouth every 8 (eight) hours as needed.     potassium chloride SA (K-DUR,KLOR-CON) 20 MEQ tablet Take 1 tablet (20 mEq total) by mouth 2 (two) times daily. 60 tablet 2   prochlorperazine (COMPAZINE) 10 MG tablet Take 10 mg by mouth every 6 (six) hours as needed.     vitamin B-12 (CYANOCOBALAMIN) 1000 MCG tablet Take 1 tablet (1,000 mcg total) by mouth daily. 30 tablet 0   Current Facility-Administered Medications  Medication Dose Route Frequency Provider Last Rate Last Dose   0.9 %  sodium chloride infusion   Intravenous Continuous Alla Feeling, NP   Stopped at 09/22/18 1242    PHYSICAL EXAMINATION: ECOG PERFORMANCE STATUS: 3 - Symptomatic, >50% confined to bed  Vitals:   09/22/18 1020 09/22/18 1025  BP: (!) 51/34 (!) 82/56  Pulse: (!) 58 (!) 113  Resp:    Temp:    SpO2:  (!) 89%   Filed Weights   09/22/18 0952  Weight: 113 lb (51.3 kg)    GENERAL:alert, no distress SKIN:  no rashes  EYES:  sclera clear OROPHARYNX:no thrush or ulcers. Discolored tongue  LYMPH:  no palpable cervical or supraclavicular lymphadenopathy  LUNGS: clear to auscultation with normal breathing effort HEART: tachycardic. regular rhythm. no lower extremity edema ABDOMEN:abdomen soft, non-tender and normal bowel sounds NEURO: alert & oriented x 3 with fluent speech. Generally weak PAC without erythema   LABORATORY DATA:  I have reviewed the data as listed CBC Latest Ref Rng & Units 09/22/2018 09/10/2018 08/25/2018  WBC 4.0 - 10.5 K/uL 4.8 4.0 3.5(L)  Hemoglobin 13.0 - 17.0 g/dL 13.7 11.2(L) 10.4(L)  Hematocrit 39.0 - 52.0 % 40.9 33.1(L) 31.1(L)  Platelets 150 - 400 K/uL 363 332 317     CMP Latest Ref Rng & Units 09/22/2018 09/10/2018 08/25/2018  Glucose 70 - 99 mg/dL 127(H) 80 81  BUN 6 - 20 mg/dL 12 5(L) 5(L)  Creatinine 0.61 - 1.24  mg/dL 0.91 0.64 0.65  Sodium 135 - 145 mmol/L 139 144 138  Potassium 3.5 - 5.1 mmol/L 4.8 4.2 3.9  Chloride 98 - 111 mmol/L 109 109 104  CO2 22 - 32 mmol/L 20(L) 27 25  Calcium 8.9 - 10.3 mg/dL 8.8(L) 7.9(L) 8.0(L)  Total Protein 6.5 - 8.1 g/dL 6.5 5.6(L) 5.6(L)  Total Bilirubin 0.3 - 1.2 mg/dL 0.4 0.3 0.3  Alkaline Phos 38 - 126 U/L 70 59 54  AST 15 - 41 U/L _0 ALT 0 - 44 U/L _1 RADIOGRAPHIC STUDIES: I have personally reviewed the radiological images as listed and agreed with the findings in the report. No results found.   ASSESSMENT & PLAN: Ryan Collins is a 58 y.o. male with   1. Gastric cancer, T4bNxM1 with presumedperitoneal metastasis, adenocarcinoma, HER2(-), MSI-stable, PD-L1positive (5%), ypT3N3aM0, lung metastasis in 05/2018 2. Orthostatic hypotension, dehydration  3. Anemia of iron deficiency and tumor bleeding, B12 deficiency; s/p IV feraheme with good response; on B complex 4. History of DVT, ? Protein C deficiency -He was on coumadin for his DVT before, and previous labs showed a protein C deficiency, which increases his risk of thrombosis. Not on anticoagulation due to underlying malignancy  5. History of alcohol and marijuana abuse - quit at time of cancer diagnosis  6. Weight loss and malnutrition  7. Peripheral neuropathy, grade 1, second to chemotherapy; stable  8. Abdominal pain -Secondary to surgery 9. Thoracic spine compression fracture, Osteoporosis, hypocalcemia; no evidence of bone mets on MRI; on calcium supplement  10. HTN 11. Hypokalemia - on KCL 1-2 x daily    He was diagnosed in 05/2017.Initial staging scan showed a large mass in LUQ,presumably peritoneal metastasis. He underwent several months of FOLFOX, followed bytotal gastrectomywith esophagojejunostomy en bloc with distal pancreatectomy/splenectomywith J-tube in placein 02/2018. Due to extensive neoadjuvant chemo, adjuvant chemotherapy was not recommended; due to D2  resection, adjuvant radiation was not recommended. He developed pulmonary metastasis in 05/2018.   He began 2nd line weekly taxol and cyramza in 06/2018, carboplatin was added in 07/2018. He tolerated well but developed neutropenia and he was dose-reduced. S/p cycle 5 day 1 on 09/10/18. He did well after chemo but has developed acute fatigue, weakness, and DOE. He has lost 17 lbs in a week and a half. BP shows orthostatic hypotension and tachycardia, he appears dehydrated. He is afebrile, no GI/GU concerns, PAC unremarkable. BP dropped significantly on standing, I recommend he increase po hydration at home and hold amlodipine. I provided this instruction in writing for him. Will support with IVF today, 1 liter NS over 2 hours today and again on 5/13. His  BP improved to 129/96 with IV fluids today and he reported to the nurse that he felt better.   Ryan Collins underwent restaging CT CAP on 09/17/18 which shows increase in size of multiple pulmonary nodules and enlargement of portocaval lymph node, consistent with worsening metastatic disease. Will discontinue taxol/carbo/cyramza due to disease progression. I reviewed with him. The patient was also seen by Dr. Burr Medico who reviewed his scan.   His abdominal pain fluctuates, he takes tylenol PRN for abdominal pain and headache. He is requesting a dose today for headache. Will give in infusion area. Due to his decreased performance status and significant dehydration and weight loss, will give IVF and IV zofran today. I encouraged him to use anti-emetics before meals and PRN, to promote nutrition and to help him gain weight. He took a few doses of mirtazapine 15 mg remotely, it made him sleepy so he discontinued it. I recommend to restart mirtazapine 7.5 mg (1/2 tab) daily at night to see if his appetite improves. He agrees.   Labs reviewed, anemia resolved. Neutropenia improved, ANC 1.5. CMP stable. Iron studies are adequate. CEA and ferritin are pending.   Ryan Collins  will return in 2 days for additional IVF. He lives with his brother who helps him. He will return in 1 week for lab and f/u with Dr. Burr Medico to discuss further treatment options.   PLAN -Lab, imaging reviewed  -Discontinue chemotherapy due to disease progression  -IV fluids only today and 5/13 -VS improved after IV hydration -IV zofran today -Tylenol x1 today for headache  -Restart mirtazapine, 7.5 mg (1/2 tab) daily at night  -Hold amlodipine  -Lab, f/u with Dr. Burr Medico in 1 week to discuss treatment options    Orders Placed This Encounter  Procedures   Orthostatic vital signs    Please obtain after IVF   All questions were answered. The patient knows to call the clinic with any problems, questions or concerns. No barriers to learning was detected.     Alla Feeling, NP 09/22/18   Addendum  I have seen the patient, examined him. I agree with the assessment and and plan and have edited the notes.   Pt is doing poorly lately, severely dehydrated with orthostatic hypotension, will give IVF today and repeat BP, arrange IV again in 2 days.   His restaging scan on 09/17/2018 showed mild disease progression in lung and abdominal nodes, I reviewed the images in person and discussed with pt. I recommend him to switch to third line Keytruda, if his PS improves in the next few days. If not, will discuss palliative care and hospice. I will see him back next week.   [name]  09/22/2018

## 2018-09-22 NOTE — Patient Instructions (Signed)
[facility] Discharge Instructions for Patients Receiving Chemotherapy  Today you received the following IV fluids and zofran  To help prevent nausea and vomiting after your treatment, we encourage you to take your nausea medication as directed  If you develop nausea and vomiting that is not controlled by your nausea medication, call the clinic.   BELOW ARE SYMPTOMS THAT SHOULD BE REPORTED IMMEDIATELY:  *FEVER GREATER THAN 100.5 F  *CHILLS WITH OR WITHOUT FEVER  NAUSEA AND VOMITING THAT IS NOT CONTROLLED WITH YOUR NAUSEA MEDICATION  *UNUSUAL SHORTNESS OF BREATH  *UNUSUAL BRUISING OR BLEEDING  TENDERNESS IN MOUTH AND THROAT WITH OR WITHOUT PRESENCE OF ULCERS  *URINARY PROBLEMS  *BOWEL PROBLEMS  UNUSUAL RASH Items with * indicate a potential emergency and should be followed up as soon as possible.  Feel free to call the clinic should you have any questions or concerns. The clinic phone number is [phone].  Please show the [facility] at check-in to the Emergency Department and triage nurse.   Dehydration, Adult  Dehydration is when there is not enough fluid or water in your body. This happens when you lose more fluids than you take in. Dehydration can range from mild to very bad. It should be treated right away to keep it from getting very bad. Symptoms of mild dehydration may include:  Thirst.  Dry lips.  Slightly dry mouth.  Dry, warm skin.  Dizziness. Symptoms of moderate dehydration may include:  Very dry mouth.  Muscle cramps.  Dark pee (urine). Pee may be the color of tea.  Your body making less pee.  Your eyes making fewer tears.  Heartbeat that is uneven or faster than normal (palpitations).  Headache.  Light-headedness, especially when you stand up from sitting.  Fainting (syncope). Symptoms of very bad dehydration may include:  Changes in skin, such as: ? Cold and clammy skin. ? Blotchy (mottled) or pale skin. ?  Skin that does not quickly return to normal after being lightly pinched and let go (poor skin turgor).  Changes in body fluids, such as: ? Feeling very thirsty. ? Your eyes making fewer tears. ? Not sweating when body temperature is high, such as in hot weather. ? Your body making very little pee.  Changes in vital signs, such as: ? Weak pulse. ? Pulse that is more than 100 beats a minute when you are sitting still. ? Fast breathing. ? Low blood pressure.  Other changes, such as: ? Sunken eyes. ? Cold hands and feet. ? Confusion. ? Lack of energy (lethargy). ? Trouble waking up from sleep. ? Short-term weight loss. ? Unconsciousness. Follow these instructions at home:   If told by your doctor, drink an ORS: ? Make an ORS by using instructions on the package. ? Start by drinking small amounts, about  cup (120 mL) every 5-10 minutes. ? Slowly drink more until you have had the amount that your doctor said to have.  Drink enough clear fluid to keep your pee clear or pale yellow. If you were told to drink an ORS, finish the ORS first, then start slowly drinking clear fluids. Drink fluids such as: ? Water. Do not drink only water by itself. Doing that can make the salt (sodium) level in your body get too low (hyponatremia). ? Ice chips. ? Fruit juice that you have added water to (diluted). ? Low-calorie sports drinks.  Avoid: ? Alcohol. ? Drinks that have a lot of sugar. These include high-calorie sports drinks,  fruit juice that does not have water added, and soda. ? Caffeine. ? Foods that are greasy or have a lot of fat or sugar.  Take over-the-counter and prescription medicines only as told by your doctor.  Do not take salt tablets. Doing that can make the salt level in your body get too high (hypernatremia).  Eat foods that have minerals (electrolytes). Examples include bananas, oranges, potatoes, tomatoes, and spinach.  Keep all follow-up visits as told by your doctor.  This is important. Contact a doctor if:  You have belly (abdominal) pain that: ? Gets worse. ? Stays in one area (localizes).  You have a rash.  You have a stiff neck.  You get angry or annoyed more easily than normal (irritability).  You are more sleepy than normal.  You have a harder time waking up than normal.  You feel: ? Weak. ? Dizzy. ? Very thirsty.  You have peed (urinated) only a small amount of very dark pee during 6-8 hours. Get help right away if:  You have symptoms of very bad dehydration.  You cannot drink fluids without throwing up (vomiting).  Your symptoms get worse with treatment.  You have a fever.  You have a very bad headache.  You are throwing up or having watery poop (diarrhea) and it: ? Gets worse. ? Does not go away.  You have blood or something green (bile) in your throw-up.  You have blood in your poop (stool). This may cause poop to look black and tarry.  You have not peed in 6-8 hours.  You pass out (faint).  Your heart rate when you are sitting still is more than 100 beats a minute.  You have trouble breathing. This information is not intended to replace advice given to you by your health care provider. Make sure you discuss any questions you have with your health care provider. Document Released: 02/24/2009 Document Revised: 11/18/2015 Document Reviewed: 06/24/2015 Elsevier Interactive Patient Education  2019 [employer]  Nausea, Adult Nausea is feeling sick to your stomach or feeling that you are about to throw up (vomit). Feeling sick to your stomach is usually not serious, but it may be an early sign of a more serious medical problem. As you feel sicker to your stomach, you may throw up. If you throw up, or if you are not able to drink enough fluids, there is a risk that you may lose too much water in your body (get dehydrated). If you lose too much water in your body, you may:  Feel tired.  Feel thirsty.  Have a dry  mouth.  Have cracked lips.  Go pee (urinate) less often. Older adults and people who have other diseases or a weak body defense system (immune system) have a higher risk of losing too much water in the body. The main goals of treating this condition are:  To relieve your nausea.  To ensure your nausea occurs less often.  To prevent throwing up and losing too much fluid. Follow these instructions at home: Watch your symptoms for any changes. Tell your doctor about them. Follow these instructions as told by your doctor. Eating and drinking      Take an ORS (oral rehydration solution). This is a drink that is sold at pharmacies and stores.  Drink clear fluids in small amounts as you are able. These include: ? Water. ? Ice chips. ? Fruit juice that has water added (diluted fruit juice). ? Low-calorie sports drinks.  Eat bland, easy-to-digest  foods in small amounts as you are able, such as: ? Bananas. ? Applesauce. ? Rice. ? Low-fat (lean) meats. ? Toast. ? Crackers.  Avoid drinking fluids that have a lot of sugar or caffeine in them. This includes energy drinks, sports drinks, and soda.  Avoid alcohol.  Avoid spicy or fatty foods. General instructions  Take over-the-counter and prescription medicines only as told by your doctor.  Rest at home while you get better.  Drink enough fluid to keep your pee (urine) pale yellow.  Take slow and deep breaths when you feel sick to your stomach.  Avoid food or things that have strong smells.  Wash your hands often with soap and water. If you cannot use soap and water, use hand sanitizer.  Make sure that all people in your home wash their hands well and often.  Keep all follow-up visits as told by your doctor. This is important. Contact a doctor if:  You feel sicker to your stomach.  You feel sick to your stomach for more than 2 days.  You throw up.  You are not able to drink fluids without throwing up.  You have  new symptoms.  You have a fever.  You have a headache.  You have muscle cramps.  You have a rash.  You have pain while peeing.  You feel light-headed or dizzy. Get help right away if:  You have pain in your chest, neck, arm, or jaw.  You feel very weak or you pass out (faint).  You have throw up that is bright red or looks like coffee grounds.  You have bloody or black poop (stools) or poop that looks like tar.  You have a very bad headache, a stiff neck, or both.  You have very bad pain, cramping, or bloating in your belly (abdomen).  You have trouble breathing or you are breathing very quickly.  Your heart is beating very quickly.  Your skin feels cold and clammy.  You feel confused.  You have signs of losing too much water in your body, such as: ? Dark pee, very little pee, or no pee. ? Cracked lips. ? Dry mouth. ? Sunken eyes. ? Sleepiness. ? Weakness. These symptoms may be an emergency. Do not wait to see if the symptoms will go away. Get medical help right away. Call your local emergency services (911 in the [place] Do not drive yourself to the hospital. Summary  Nausea is feeling sick to your stomach or feeling that you are about to throw up (vomit).  If you throw up, or if you are not able to drink enough fluids, there is a risk that you may lose too much water in your body (get dehydrated).  Eat and drink what your doctor tells you. Take over-the-counter and prescription medicines only as told by your doctor.  Contact a doctor right away if your symptoms get worse or you have new symptoms.  Keep all follow-up visits as told by your doctor. This is important. This information is not intended to replace advice given to you by your health care provider. Make sure you discuss any questions you have with your health care provider. Document Released: 04/19/2011 Document Revised: 10/08/2017 Document Reviewed: 10/08/2017 Elsevier Interactive Patient Education   2019 [employer].

## 2018-09-24 ENCOUNTER — Other Ambulatory Visit: Payer: Self-pay

## 2018-09-24 ENCOUNTER — Inpatient Hospital Stay: Payer: Medicaid Other

## 2018-09-24 DIAGNOSIS — C161 Malignant neoplasm of fundus of stomach: Secondary | ICD-10-CM

## 2018-09-24 DIAGNOSIS — E860 Dehydration: Secondary | ICD-10-CM

## 2018-09-24 DIAGNOSIS — Z5112 Encounter for antineoplastic immunotherapy: Secondary | ICD-10-CM | POA: Diagnosis not present

## 2018-09-24 MED ORDER — HEPARIN SOD (PORK) LOCK FLUSH 100 UNIT/ML IV SOLN
500.0000 [IU] | Freq: Once | INTRAVENOUS | Status: AC
  Administered 2018-09-24: 12:00:00 500 [IU] via INTRAVENOUS
  Filled 2018-09-24: qty 5

## 2018-09-24 MED ORDER — SODIUM CHLORIDE 0.9% FLUSH
10.0000 mL | Freq: Once | INTRAVENOUS | Status: AC
  Administered 2018-09-24: 12:00:00 10 mL via INTRAVENOUS
  Filled 2018-09-24: qty 10

## 2018-09-24 MED ORDER — SODIUM CHLORIDE 0.9 % IV SOLN
INTRAVENOUS | Status: DC
  Administered 2018-09-24: 09:00:00 via INTRAVENOUS
  Filled 2018-09-24 (×2): qty 250

## 2018-09-24 NOTE — Patient Instructions (Signed)
Dehydration, Adult  Dehydration is when there is not enough fluid or water in your body. This happens when you lose more fluids than you take in. Dehydration can range from mild to very bad. It should be treated right away to keep it from getting very bad. Symptoms of mild dehydration may include:  Thirst.  Dry lips.  Slightly dry mouth.  Dry, warm skin.  Dizziness. Symptoms of moderate dehydration may include:  Very dry mouth.  Muscle cramps.  Dark pee (urine). Pee may be the color of tea.  Your body making less pee.  Your eyes making fewer tears.  Heartbeat that is uneven or faster than normal (palpitations).  Headache.  Light-headedness, especially when you stand up from sitting.  Fainting (syncope). Symptoms of very bad dehydration may include:  Changes in skin, such as: ? Cold and clammy skin. ? Blotchy (mottled) or pale skin. ? Skin that does not quickly return to normal after being lightly pinched and let go (poor skin turgor).  Changes in body fluids, such as: ? Feeling very thirsty. ? Your eyes making fewer tears. ? Not sweating when body temperature is high, such as in hot weather. ? Your body making very little pee.  Changes in vital signs, such as: ? Weak pulse. ? Pulse that is more than 100 beats a minute when you are sitting still. ? Fast breathing. ? Low blood pressure.  Other changes, such as: ? Sunken eyes. ? Cold hands and feet. ? Confusion. ? Lack of energy (lethargy). ? Trouble waking up from sleep. ? Short-term weight loss. ? Unconsciousness. Follow these instructions at home:   If told by your doctor, drink an ORS: ? Make an ORS by using instructions on the package. ? Start by drinking small amounts, about  cup (120 mL) every 5-10 minutes. ? Slowly drink more until you have had the amount that your doctor said to have.  Drink enough clear fluid to keep your pee clear or pale yellow. If you were told to drink an ORS, finish the  ORS first, then start slowly drinking clear fluids. Drink fluids such as: ? Water. Do not drink only water by itself. Doing that can make the salt (sodium) level in your body get too low (hyponatremia). ? Ice chips. ? Fruit juice that you have added water to (diluted). ? Low-calorie sports drinks.  Avoid: ? Alcohol. ? Drinks that have a lot of sugar. These include high-calorie sports drinks, fruit juice that does not have water added, and soda. ? Caffeine. ? Foods that are greasy or have a lot of fat or sugar.  Take over-the-counter and prescription medicines only as told by your doctor.  Do not take salt tablets. Doing that can make the salt level in your body get too high (hypernatremia).  Eat foods that have minerals (electrolytes). Examples include bananas, oranges, potatoes, tomatoes, and spinach.  Keep all follow-up visits as told by your doctor. This is important. Contact a doctor if:  You have belly (abdominal) pain that: ? Gets worse. ? Stays in one area (localizes).  You have a rash.  You have a stiff neck.  You get angry or annoyed more easily than normal (irritability).  You are more sleepy than normal.  You have a harder time waking up than normal.  You feel: ? Weak. ? Dizzy. ? Very thirsty.  You have peed (urinated) only a small amount of very dark pee during 6-8 hours. Get help right away if:  You have   symptoms of very bad dehydration.  You cannot drink fluids without throwing up (vomiting).  Your symptoms get worse with treatment.  You have a fever.  You have a very bad headache.  You are throwing up or having watery poop (diarrhea) and it: ? Gets worse. ? Does not go away.  You have blood or something green (bile) in your throw-up.  You have blood in your poop (stool). This may cause poop to look black and tarry.  You have not peed in 6-8 hours.  You pass out (faint).  Your heart rate when you are sitting still is more than 100 beats a  minute.  You have trouble breathing. This information is not intended to replace advice given to you by your health care provider. Make sure you discuss any questions you have with your health care provider. Document Released: 02/24/2009 Document Revised: 11/18/2015 Document Reviewed: 06/24/2015 Elsevier Interactive Patient Education  2019 [employer]  

## 2018-09-29 NOTE — Progress Notes (Signed)
[place]   [phone] [phone]   Clinic Follow up Note   Patient Care Team: Lanae Boast, [place] as PCP - General (Family Medicine)  Date of Service:  10/01/2018  CHIEF COMPLAINT: F/u of gastric cancer  SUMMARY OF ONCOLOGIC HISTORY: Oncology History   Cancer Staging Gastric cancer [facility]) Staging form: Stomach, AJCC 8th Edition - Clinical stage from 06/04/2017: Stage IVB (cT4, cN0, cM1) - Signed by [name], MD on 06/10/2017 - Pathologic stage from 02/25/2018: Stage III (ypT3, pN3a, cM0) - Signed by [name], MD on 04/01/2018       Gastric cancer ([place])   06/03/2017 - 06/06/2017 Hospital Admission    Admitted to the hospital on 06/03/17 with complaints of fatigue and melena. During his stay he underwent an endoscopy that revealed a malignant gastric tumor in the cardia and in the gastric fundus. Biopsy results revealed adenocarcinoma. Pt was discharged on 06/06/17.    06/04/2017 Initial Biopsy    Diagnosis 06/04/17 Stomach, biopsy, Fundus - ADENOCARCINOMA - SEE COMMENT Microscopic Comment The stomach biopsy is of an adenocarcinoma arising in a background of intestinal metaplasia. A Warthin-Starry stain is performed to determine the possibility of the presence of Helicobacter pylori. The Warthin-Starry stain is negative for organisms morphologically consistent with Helicobacter pylori.    06/04/2017 Procedure    Esophagogastroduodenoscopy 06/04/17 by Dr. Benson Norway Impression: Normal esophagus. Malignant gastric tumor in the cardia and in the  gastric fundus. Biopsied. Normal examined duodenum.    06/04/2017 Miscellaneous    HER2 (-) EBV (-) MSI-S PD-L1 CPS 5%    06/05/2017 Imaging    CT CAP W Contrast 06/05/17 IMPRESSION: 9.1 cm fungating mass in the gastric cardia/posterior gastric fundus, corresponding to the patient's newly diagnosed gastric Cancer. Associated extra gastric extension with peritoneal disease in the left upper abdomen,  including a dominant 4.2 cm peritoneal implant. Small volume pelvic ascites. 1.8 cm hypoenhancing lesion along the inferior spleen, indeterminate.    06/10/2017 Initial Diagnosis    Gastric cancer ([place])    06/26/2017 - 01/22/2018 Chemotherapy    First line chemo FOLFOX every 2 weeks     08/29/2017 Imaging    IMPRESSION: 1. Interval decrease in size of fungating ulcerative mass arising from the posterior gastric fundus. 2. Left peritoneal implant, compatible with peritoneal carcinomatosis, has decreased in size from the previous exam. 3. Right lobe of liver lesion present on previous exam is less conspicuous on today's study. A new lesion is identified within the left lobe of liver, suspicious for metastasis. Additional focus of low attenuation within segment 5 adjacent to the gallbladder fossa may represent focal fatty deposition. 4. Similar appearance of mildly complex cyst containing mural calcification and possible internal septation arising from the inferior pole of right kidney. More definitive characterization of this lesion with renal protocol MRI may be helpful. 5.  Mild prostate gland enlargement. 6.  Aortic Atherosclerosis (ICD10-I70.0).    11/25/2017 Imaging    11/25/2017 CT AP W Contrast IMPRESSION: 1. Mixed appearance, with the gastric fundal mass thicker than it was on 08/29/2017 (although still improved compared to 06/05/2017), but with reduced local conglomerate adenopathy along the splenic hilum compared to 08/29/2017. 2. At this time I do not see any definite hepatic metastatic lesions. Several lesions of suspicion in the left hepatic lobe and inferiorly in the right hepatic lobe are not readily visible today. There is a small focus of hypodensity adjacent to the gallbladder fossa in segment 7 which is technically nonspecific but  which could reflect focal fatty infiltration. 3. Increase in nodular contour of scarring in the left lower lobe. This is probably incidental but  I cannot completely exclude the possibility of a localized metastatic lesion. Surveillance of the left lower lobe suggested. 4. Bosniak category 4F cyst of the right kidney lower pole with internal septation and calcification. Surveillance of this lesion is suggested; I suspect that the patient's cancer surveillance will supersede the typical recommended surveillance schedule for complex cyst. 5. Other imaging findings of potential clinical significance: Aortic Atherosclerosis (ICD10-I70.0). Degenerative glenohumeral arthropathy bilaterally. Mild impingement at L4-5 and L5-S1.    12/23/2017 Imaging    12/23/2017 MRI Abdomen IMPRESSION: Ill-defined soft tissue mass in the gastric fundus shows no significant change, consistent known gastric adenocarcinoma.  No definite liver metastases identified. Hemosiderosis noted, with focal area of sparing seen in the right hepatic lobe which accounts for the lesions seen on recent CT.  Stable 1.9 cm probably benign Bosniak category 2 F cystic lesion in right kidney. Recommend continued attention on follow-up imaging.    02/12/2018 Imaging    02/12/2018 CT CAP IMPRESSION: 1. Roughly similar appearance of the mass involving the gastric cardia/fundus. 2. Hepatic steatosis with some areas of fatty sparing as shown on MRI. No definite focal metastatic lesion to the liver is identified. 3. Nonspecific 0.8 cm lymph node between the tail the pancreas in the splenic hilum. There is also a mildly enlarged but stable right hilar lymph node at 1.0 cm in diameter, significance uncertain. 4. Overall stable appearance of the 1.9 cm Bosniak category 4F cyst of the right kidney lower pole. We have now documented 9 months of relative stability. Surveillance at or before 6 months time is recommended. 5. Other imaging findings of potential clinical significance: Aortic Atherosclerosis (ICD10-I70.0). Small anterior pericardial effusion. Stable scarring in the  left lower lobe. Medial hypoenhancement of the upper spleen is probably from early phase of contrast, less likely to be splenic infarct given the relatively normal appearance on delayed images. Impingement at L4-5 and L5-S1.    02/25/2018 Surgery    On 02/25/2018, he had a diagnostic laparoscopy, liver biopsy, total gastrectomy with esophagojejunostomy en bloc with distal pancreatectomy/splenectomy, feeding jejunostomy tube with Dr. Barry Dienes.     02/25/2018 Pathology Results    02/25/2018 Surgical Pathology Diagnosis 1. Soft tissue, biopsy, Diaphragmatic Nodule - NEGATIVE FOR CARCINOMA. 2. Liver, biopsy - LIVER PARENCHYMA, NEGATIVE FOR CARCINOMA. 3. Liver, biopsy, Right - LIVER PARENCHYMA WITH CAPSULAR FIBROSIS. - NEGATIVE FOR CARCINOMA. 4. Soft tissue, biopsy, Falciform Nodule - LIVER PARENCHYMA WITH FIBROTIC BANDS. SEE NOTE. - NEGATIVE FOR CARCINOMA. 5. Omentum, resection for tumor - BENIGN PERITONEUM-LINED ADIPOSE TISSUE. - NEGATIVE FOR CARCINOMA. 6. Stomach, resection for tumor, distal pancreas and spleen - INVASIVE MODERATELY TO POORLY DIFFERENTIATED GASTRIC CARCINOMA, 7 CM. - CARCINOMA INVADES INTO PERIGASTRIC SOFT TISSUE AND IS ADHERENT TO PANCREAS AND SPLEEN BUT DOES NOT INVADE INTO PANCREATIC OR SPLENIC PARENCHYMA. - NO EVIDENCE OF LYMPHOVASCULAR OR PERINEURAL INVASION. - MINIMAL THERAPEUTIC RESPONSE (TUMOR REGRESSION SCORE 3). - METASTATIC CARCINOMA TO EIGHT OF THIRTY-SEVEN LYMPH NODES (8/37). - ALL RESECTION MARGINS ARE NEGATIVE FOR CARCINOMA. - BENIGN UNREMARKABLE PANCREAS. - SEE ONCOLOGY TABLE.    02/25/2018 Cancer Staging    Staging form: Stomach, AJCC 8th Edition - Pathologic stage from 02/25/2018: Stage III (ypT3, pN3a, cM0) - Signed by [name], MD on 04/01/2018    06/05/2018 Imaging    CT CAP W COntrast 06/05/18  IMPRESSION: 1. Interval gastrectomy with esophagojejunostomy in the  lower chest. Splenectomy and partial pancreatectomy. 2. At least 35 new  pulmonary nodules are present, measuring up to 1.5 cm in diameter, high suspicion for metastatic disease. 3. Portacaval and porta hepatis adenopathy, suspicious for malignancy. 4. Although I do not see a definite liver tumor, sensitivity for small lesions is mildly adversely affected due to the late arterial phase of contrast administration. 5. New thoracic spine compression fractures at T6, T7, T2, T3, and T4 as detailed above. New lower sternal manubrial fracture. 6. Right inguinal hernia containing a loop of small bowel, no overt obstruction or strangulation currently evident. 7. Diffuse mild subcutaneous and mesenteric edema, cause uncertain. 8. Other imaging findings of potential clinical significance: Aortic Atherosclerosis (ICD10-I70.0). Lower lumbar spondylosis and degenerative disc disease.    06/16/2018 - 09/10/2018 Chemotherapy    On second line chemo weekly taxol andCyramzaq2weeks starting 06/16/2018. Added weekly Carboplatin with cycle 3. D/c after cycle 5 due to disease progression.     09/17/2018 Imaging    CT CAP IMPRESSION: 1. A 7 mm pulmonary nodule of the right lower lobe as slightly increased in size, measuring 7 mm, previously 3 mm (series 6, image 82). Multiple additional small bilateral pulmonary nodules are minimally increased in size.  2. Slight interval enlargement of a hypodense portacaval lymph node, measuring 3.1 x 2.2 cm, previously 2.6 x 2.0 cm when measured similarly (series 2, image 69).  3.  Findings are consistent with worsening metastatic disease.  4. Unchanged postoperative findings status post gastrectomy, splenectomy, and distal pancreatectomy.    10/01/2018 -  Chemotherapy    Keytruda every 3 weeks starting this week      CURRENT THERAPY:  PENDING third line Keytruda every 3 weeks starting this week.   INTERVAL HISTORY:  Ryan Collins is here for a follow up. He is here alone. He notes he felt better with IV Fluids. He notes he is barely  able to take care of himself at home but still able to get around. He notes he appetite is lower. He has been drinking more ensure (3-4) and soup in diet a day. He notes mild nausea with walking for long periods of time. He notes having frontal HA and needs to take Tylenol every 4-6 hours. He notes this is every day. He denies chest discomfort or SOB. He notes he is still not taking amlodipine. He has not ben taking potassium the last few days. I reviewed his medication list with him.     REVIEW OF SYSTEMS:   Constitutional: Denies fevers, chills or abnormal weight loss (+) Frontal HA (+) lower appetite (+) Fatigue  Eyes: Denies blurriness of vision Ears, nose, mouth, throat, and face: Denies mucositis or sore throat Respiratory: Denies cough, dyspnea or wheezes Cardiovascular: Denies palpitation, chest discomfort or lower extremity swelling Gastrointestinal:  Denies heartburn or change in bowel habits (+) mild nausea Skin: Denies abnormal skin rashes Lymphatics: Denies new lymphadenopathy or easy bruising Neurological:Denies numbness, tingling or new weaknesses Behavioral/Psych: Mood is stable, no new changes  All other systems were reviewed with the patient and are negative.  MEDICAL HISTORY:  Past Medical History:  Diagnosis Date  . Cancer [facility])    gastric cancer  . DVT (deep venous thrombosis) ([place]) 2011  . GI bleeding 2001  . History of DVT of lower extremity   . Hypertension   . Melena 05/2017  . Stomach ulcer    Clinically suspected; No EGD confirmation as of 06/03/17    SURGICAL HISTORY: Past Surgical History:  Procedure Laterality Date  . ESOPHAGOGASTRODUODENOSCOPY  06/04/2017  . ESOPHAGOGASTRODUODENOSCOPY N/A 06/04/2017   Procedure: ESOPHAGOGASTRODUODENOSCOPY (EGD);  Surgeon: Carol Ada, MD;  Location: [place];  Service: Endoscopy;  Laterality: N/A;  . GASTRECTOMY N/A 02/25/2018   Procedure: TOTAL GASTRECTOMY WITH J TUBE PLACEMENT;  Surgeon: [name], MD;   Location: [place];  Service: General;  Laterality: N/A;  . IR FLUORO GUIDE PORT INSERTION RIGHT  06/20/2017  . IR US GUIDE VASC ACCESS RIGHT  06/20/2017  . JEJUNOSTOMY N/A 02/25/2018   Procedure: JEJUNOSTOMY TUBE;  Surgeon: [name], MD;  Location: [place];  Service: General;  Laterality: N/A;  . LAPAROSCOPY N/A 02/25/2018   Procedure: LAPAROSCOPY DIAGNOSTIC;  Surgeon: [name], MD;  Location: [place];  Service: General;  Laterality: N/A;  . SPLENECTOMY, TOTAL N/A 02/25/2018   Procedure: SPLENECTOMY;  Surgeon: [name], MD;  Location: [place];  Service: General;  Laterality: N/A;    I have reviewed the social history and family history with the patient and they are unchanged from previous note.  ALLERGIES:  has No Known Allergies.  MEDICATIONS:  Current Outpatient Medications  Medication Sig Dispense Refill  . acetaminophen (TYLENOL) 500 MG tablet Take 1,000 mg by mouth every 6 (six) hours as needed for mild pain.     . Amino Acids-Protein Hydrolys (FEEDING SUPPLEMENT, PRO-STAT SUGAR FREE 64,) LIQD Place 30 mLs into feeding tube 3 (three) times daily. 900 mL 0  . amLODipine (NORVASC) 2.5 MG tablet Take 1 tablet (2.5 mg total) by mouth daily. 90 tablet 3  . cyclobenzaprine (FLEXERIL) 10 MG tablet Take 1 tablet (10 mg total) by mouth 3 (three) times daily as needed for muscle spasms. 30 tablet 1  . lidocaine-prilocaine (EMLA) cream Apply 1 application topically as needed (for port). 30 g 2  . magic mouthwash w/lidocaine SOLN Take 5 mLs by mouth 4 (four) times daily as needed for mouth pain. 240 mL 2  . ondansetron (ZOFRAN) 8 MG tablet Take 8 mg by mouth every 8 (eight) hours as needed.    . potassium chloride SA (K-DUR,KLOR-CON) 20 MEQ tablet Take 1 tablet (20 mEq total) by mouth 2 (two) times daily. 60 tablet 2  . prochlorperazine (COMPAZINE) 10 MG tablet Take 10 mg by mouth every 6 (six) hours as needed.    . vitamin B-12 (CYANOCOBALAMIN) 1000 MCG tablet Take 1 tablet (1,000 mcg total)  by mouth daily. 30 tablet 0   Current Facility-Administered Medications  Medication Dose Route Frequency Provider Last Rate Last Dose  . 0.9 %  sodium chloride infusion   Intravenous Continuous [name], MD   Stopped at 10/01/18 1418   Facility-Administered Medications Ordered in Other Visits  Medication Dose Route Frequency Provider Last Rate Last Dose  . sodium chloride flush (NS) 0.9 % injection 10 mL  10 mL Intravenous PRN [name], MD   10 mL at 10/01/18 1426    PHYSICAL EXAMINATION: ECOG PERFORMANCE STATUS: 3 - Symptomatic, >50% confined to bed  Vitals:   10/01/18 1107  BP: (!) 122/100  Pulse: (!) 103  Resp: 17  Temp: 98.2 F (36.8 C)  SpO2: 100%   Filed Weights   10/01/18 1107  Weight: 115 lb (52.2 kg)    GENERAL:alert, no distress and comfortable SKIN: skin color, texture, turgor are normal, no rashes or significant lesions EYES: normal, Conjunctiva are pink and non-injected, sclera clear OROPHARYNX:no exudate, no erythema and lips, buccal mucosa, and tongue normal  NECK: supple, thyroid normal size, non-tender, without  nodularity LYMPH:  no palpable lymphadenopathy in the cervical, axillary or inguinal LUNGS: clear to auscultation and percussion with normal breathing effort HEART: regular rate & rhythm and no murmurs and no lower extremity edema ABDOMEN:abdomen soft, non-tender and normal bowel sounds Musculoskeletal:no cyanosis of digits and no clubbing  NEURO: alert & oriented x 3 with fluent speech, no focal motor/sensory deficits  LABORATORY DATA:  I have reviewed the data as listed CBC Latest Ref Rng & Units 10/01/2018 09/22/2018 09/10/2018  WBC 4.0 - 10.5 K/uL 6.0 4.8 4.0  Hemoglobin 13.0 - 17.0 g/dL 13.0 13.7 11.2(L)  Hematocrit 39.0 - 52.0 % 38.0(L) 40.9 33.1(L)  Platelets 150 - 400 K/uL 418(H) 363 332     CMP Latest Ref Rng & Units 10/01/2018 09/22/2018 09/10/2018  Glucose 70 - 99 mg/dL 84 127(H) 80  BUN 6 - 20 mg/dL 9 12 5(L)  Creatinine 0.61 - 1.24  mg/dL 0.78 0.91 0.64  Sodium 135 - 145 mmol/L 142 139 144  Potassium 3.5 - 5.1 mmol/L 3.9 4.8 4.2  Chloride 98 - 111 mmol/L 104 109 109  CO2 22 - 32 mmol/L 28 20(L) 27  Calcium 8.9 - 10.3 mg/dL 8.5(L) 8.8(L) 7.9(L)  Total Protein 6.5 - 8.1 g/dL 6.1(L) 6.5 5.6(L)  Total Bilirubin 0.3 - 1.2 mg/dL 0.3 0.4 0.3  Alkaline Phos 38 - 126 U/L 70 70 59  AST 15 - 41 U/L _0 ALT 0 - 44 U/L _1 RADIOGRAPHIC STUDIES: I have personally reviewed the radiological images as listed and agreed with the findings in the report. No results found.   ASSESSMENT & PLAN:  Elvie Maines is a 58 y.o. male with   1. Gastric cancer, T4bNxM1 with presumedperitoneal metastasis, adenocarcinoma, HER2(-), MSI-stable, PD-L1positive (5%), ypT3N3aM0, lung metastasis in 05/2018 -He was diagnosed in 05/2017.Initial staging scan showed a large mass in LUQ,presumably peritoneal metastasis. He underwent several months of FOLFOX, followed bytotal gastrectomywith esophagojejunostomy en bloc with distal pancreatectomy/splenectomywith J-tube in placein 02/2018 -Due to his prolonged neoadjuvant chemotherapywith FOLOX, I do not think he would benefit more from adjuvant chemo.Due to the D2 resection, adjuvant radiation wasalso not recommended -Unfortunately he developed lung metastasis in Jan 2020 -I have started him on second line chemo weekly taxol andCyramzastarting 06/16/2018.Added Carboplatin on 07/29/18. He is tolerating well overall.  -Given he is PD-L1 positive, CPS 5, I discussed this makes him eligible for immunotherapy with Keytruda for next-line treatment.  -His CT CAP on 09/17/18 shows overall mild disease progression in lungs and abdominal LNs, the largest lung lesion in RLL is stable. Due to his slight disease progression and worsening PS, we discontinued taxol/carbo/Cyramza and will switch him to third-line Keytruda.  -I discussed the anticipated response rate from [facility] is probably low due  to the low PD-L1 expression in his tumor. We also discussed the option of palliative care alone. He is interested in continuing treatment with Keytruda. Plan to start this week.  -His performance status is slowly improving. He has frontal HA and fatigue with low appetite on an ensure and soup diet, weight is currently maintained. Will likely given IV Fluids today.  -Labs reviewed, CBC and CMP WNL except PLT 418K, and low protein level.  -F/u in 3 weeks   2. Anemia of iron deficiency and tumor bleeding, B12 deficiency -He has clinical GI bleeding with melena, iron study also showed iron deficiency. -He has received blood transfusion, and3dose of IV Ferahemepreviously, responded well. -takesOral-B complex -Hg  normal today, 13today (10/01/18)   3. History of DVT, ? Protein C deficiency -He was on coumadin for his DVT before, and previous labs showed a protein C deficiency, which increases his risk of thrombosis. -We previously discussed the high risk of thrombosis secondary to his underlying malignancy. -he is not on anyphylactic anticoagulation.  4. History of alcohol and marijuana abuse -He has quit drinking alcohol since his diagnosis of cancer -Hestilldoes smoke marijuana occasionally  5. Weight loss and malnutrition  -He has lost significant weight since the diagnosisand surgery -His J-tube fell outincidentally in December 2019 -He still has lower weight, his appetite continues to decline. He is mainly on a diet consisting of ensure and soup. I encouraged him to increase ensure to 6-7 times a day. I will obtain more ensure samples for him.  -I encouraged him to follow up with ourdietitian.   6. Peripheral neuropathy, grade 1, second to chemotherapy  -He has neuropathy on his fingers and toes,mainly numbness. Currently manageable  -Will monitor, If worsens will start him on Gabapentin.  7. Abdominal pain  -Secondary to surgery  -He completed his prescription of  oxycodone given by Dr. Barry Dienes.  -Due to his previous substance abuse,I will be cautious about narcotics -Takes Tylenol and Alkazaltzer. -Currently resolved overall.   8. Thoracic spine compression fracture, Osteoporosis, hypocalcemia.  -I have discussed histhoracicspineMRIfindings with pt, no evidence of bone mets  -I encouraged good body mechanics -I also discussed bisphosphonate to strengthen his bones. He prefers oral medication. Due to his persistent hypocalcemia, he has not started yet.  -I encouraged him to take OTC calcium and vitD -For occasionally back pain he takes Flexeril as needed  9. Goal of care discussion  -We again discussed the incurable nature of his cancer, and the overall poor prognosis, especially if he does not have good response to chemotherapy or progress on chemo -The patient understands the goal of care is palliative. -I recommend DNR/DNI, he will think about it. He wants his mother to be his health care POA. He does not have a living will, I encourage him to have advanced directives     Plan -NS 1082m today over 2 hours  -we gave pt a case of Ensure today, f/u with dietician  -Keytruda tomorrow or Friday this week and 3 weeks after with lab, flush and f/u  -He knows to call [place] needed, he has appointment with PCP on 5/28    No problem-specific Assessment & Plan notes found for this encounter.   No orders of the defined types were placed in this encounter.  All questions were answered. The patient knows to call the clinic with any problems, questions or concerns. No barriers to learning was detected. I spent 20 minutes counseling the patient face to face. The total time spent in the appointment was 25 minutes and more than 50% was on counseling and review of test results     YTruitt Merle MD 10/01/2018   I, AJoslyn Devon am acting as scribe for YTruitt Merle MD.   I have reviewed the above documentation for accuracy and completeness, and I  agree with the above.

## 2018-09-30 ENCOUNTER — Other Ambulatory Visit: Payer: Self-pay | Admitting: Hematology

## 2018-09-30 DIAGNOSIS — Z7189 Other specified counseling: Secondary | ICD-10-CM | POA: Insufficient documentation

## 2018-09-30 DIAGNOSIS — I10 Essential (primary) hypertension: Secondary | ICD-10-CM

## 2018-09-30 NOTE — Progress Notes (Signed)
DISCONTINUE ON PATHWAY REGIMEN - Gastroesophageal     A cycle is every 28 days:     Ramucirumab      Paclitaxel   **Always confirm dose/schedule in your pharmacy ordering system**  REASON: Disease Progression PRIOR TREATMENT: GEOS14: Ramucirumab 8 mg/kg Days 1, 15 + Paclitaxel 80 mg/m2 Days 1, 8, 15 q28 Days Until Progression or Unacceptable Toxicity TREATMENT RESPONSE: Partial Response (PR)  START ON PATHWAY REGIMEN - Gastroesophageal     A cycle is 21 days:     Pembrolizumab   **Always confirm dose/schedule in your pharmacy ordering system**  Patient Characteristics: Distant Metastases (cM1/pM1) / Locally Recurrent Disease, Adenocarcinoma - Esophageal, GE Junction, and Gastric, Third Line and Beyond, MSS/pMMR or MSI Unknown, PD?L1 Expression  Positive(CPS ? 1) Histology: Adenocarcinoma Disease Classification: Gastric Therapeutic Status: Distant Metastases (No Additional Staging) Line of Therapy: Third Engineer, civil (consulting) Status: MSS/pMMR PD-L1 Expression Status: PD-L1 Positive (CPS ? 1) Prior Immunotherapy Status: No Prior PD-1/PD-L1 Inhibitor Intent of Therapy: Non-Curative / Palliative Intent, Discussed with Patient

## 2018-10-01 ENCOUNTER — Inpatient Hospital Stay: Payer: Medicaid Other

## 2018-10-01 ENCOUNTER — Inpatient Hospital Stay (HOSPITAL_BASED_OUTPATIENT_CLINIC_OR_DEPARTMENT_OTHER): Payer: Medicaid Other | Admitting: Medical

## 2018-10-01 ENCOUNTER — Other Ambulatory Visit: Payer: Self-pay

## 2018-10-01 ENCOUNTER — Telehealth: Payer: Self-pay | Admitting: Hematology

## 2018-10-01 ENCOUNTER — Inpatient Hospital Stay (HOSPITAL_BASED_OUTPATIENT_CLINIC_OR_DEPARTMENT_OTHER): Payer: Medicaid Other | Admitting: Hematology

## 2018-10-01 ENCOUNTER — Encounter: Payer: Self-pay | Admitting: Hematology

## 2018-10-01 VITALS — BP 122/100 | HR 103 | Temp 98.2°F | Resp 17 | Ht 71.0 in | Wt 115.0 lb

## 2018-10-01 VITALS — BP 135/96 | HR 85 | Temp 98.4°F | Resp 18

## 2018-10-01 DIAGNOSIS — C7800 Secondary malignant neoplasm of unspecified lung: Secondary | ICD-10-CM

## 2018-10-01 DIAGNOSIS — C161 Malignant neoplasm of fundus of stomach: Secondary | ICD-10-CM

## 2018-10-01 DIAGNOSIS — I10 Essential (primary) hypertension: Secondary | ICD-10-CM

## 2018-10-01 DIAGNOSIS — E860 Dehydration: Secondary | ICD-10-CM

## 2018-10-01 DIAGNOSIS — I951 Orthostatic hypotension: Secondary | ICD-10-CM

## 2018-10-01 DIAGNOSIS — Z79899 Other long term (current) drug therapy: Secondary | ICD-10-CM

## 2018-10-01 DIAGNOSIS — E538 Deficiency of other specified B group vitamins: Secondary | ICD-10-CM

## 2018-10-01 DIAGNOSIS — F1010 Alcohol abuse, uncomplicated: Secondary | ICD-10-CM

## 2018-10-01 DIAGNOSIS — R109 Unspecified abdominal pain: Secondary | ICD-10-CM

## 2018-10-01 DIAGNOSIS — Z934 Other artificial openings of gastrointestinal tract status: Secondary | ICD-10-CM

## 2018-10-01 DIAGNOSIS — C786 Secondary malignant neoplasm of retroperitoneum and peritoneum: Secondary | ICD-10-CM

## 2018-10-01 DIAGNOSIS — E876 Hypokalemia: Secondary | ICD-10-CM

## 2018-10-01 DIAGNOSIS — Z5112 Encounter for antineoplastic immunotherapy: Secondary | ICD-10-CM | POA: Diagnosis not present

## 2018-10-01 DIAGNOSIS — D509 Iron deficiency anemia, unspecified: Secondary | ICD-10-CM

## 2018-10-01 DIAGNOSIS — G620 Drug-induced polyneuropathy: Secondary | ICD-10-CM

## 2018-10-01 DIAGNOSIS — E46 Unspecified protein-calorie malnutrition: Secondary | ICD-10-CM

## 2018-10-01 DIAGNOSIS — Z903 Acquired absence of stomach [part of]: Secondary | ICD-10-CM

## 2018-10-01 DIAGNOSIS — Z95828 Presence of other vascular implants and grafts: Secondary | ICD-10-CM

## 2018-10-01 DIAGNOSIS — D500 Iron deficiency anemia secondary to blood loss (chronic): Secondary | ICD-10-CM

## 2018-10-01 DIAGNOSIS — Z86718 Personal history of other venous thrombosis and embolism: Secondary | ICD-10-CM

## 2018-10-01 LAB — CBC WITH DIFFERENTIAL (CANCER CENTER ONLY)
Abs Immature Granulocytes: 0.01 10*3/uL (ref 0.00–0.07)
Basophils Absolute: 0 10*3/uL (ref 0.0–0.1)
Basophils Relative: 0 %
Eosinophils Absolute: 0 10*3/uL (ref 0.0–0.5)
Eosinophils Relative: 0 %
HCT: 38 % — ABNORMAL LOW (ref 39.0–52.0)
Hemoglobin: 13 g/dL (ref 13.0–17.0)
Immature Granulocytes: 0 %
Lymphocytes Relative: 64 %
Lymphs Abs: 3.7 10*3/uL (ref 0.7–4.0)
MCH: 28.8 pg (ref 26.0–34.0)
MCHC: 34.2 g/dL (ref 30.0–36.0)
MCV: 84.3 fL (ref 80.0–100.0)
Monocytes Absolute: 0.5 10*3/uL (ref 0.1–1.0)
Monocytes Relative: 8 %
Neutro Abs: 1.7 10*3/uL (ref 1.7–7.7)
Neutrophils Relative %: 28 %
Platelet Count: 418 10*3/uL — ABNORMAL HIGH (ref 150–400)
RBC: 4.51 MIL/uL (ref 4.22–5.81)
RDW: 22.1 % — ABNORMAL HIGH (ref 11.5–15.5)
WBC Count: 6 10*3/uL (ref 4.0–10.5)
nRBC: 0 % (ref 0.0–0.2)

## 2018-10-01 LAB — CMP (CANCER CENTER ONLY)
ALT: 12 U/L (ref 0–44)
AST: 23 U/L (ref 15–41)
Albumin: 2.7 g/dL — ABNORMAL LOW (ref 3.5–5.0)
Alkaline Phosphatase: 70 U/L (ref 38–126)
Anion gap: 10 (ref 5–15)
BUN: 9 mg/dL (ref 6–20)
CO2: 28 mmol/L (ref 22–32)
Calcium: 8.5 mg/dL — ABNORMAL LOW (ref 8.9–10.3)
Chloride: 104 mmol/L (ref 98–111)
Creatinine: 0.78 mg/dL (ref 0.61–1.24)
GFR, Est AFR Am: >60 mL/min (ref >60)
GFR, Estimated: >60 mL/min (ref >60)
Glucose, Bld: 84 mg/dL (ref 70–99)
Potassium: 3.9 mmol/L (ref 3.5–5.1)
Sodium: 142 mmol/L (ref 135–145)
Total Bilirubin: 0.3 mg/dL (ref 0.3–1.2)
Total Protein: 6.1 g/dL — ABNORMAL LOW (ref 6.5–8.1)

## 2018-10-01 MED ORDER — HEPARIN SOD (PORK) LOCK FLUSH 100 UNIT/ML IV SOLN
500.0000 [IU] | Freq: Once | INTRAVENOUS | Status: AC
  Administered 2018-10-01: 500 [IU]
  Filled 2018-10-01: qty 5

## 2018-10-01 MED ORDER — ACETAMINOPHEN 325 MG PO TABS
ORAL_TABLET | ORAL | Status: AC
  Filled 2018-10-01: qty 2

## 2018-10-01 MED ORDER — SODIUM CHLORIDE 0.9% FLUSH
10.0000 mL | Freq: Once | INTRAVENOUS | Status: AC
  Administered 2018-10-01: 10 mL
  Filled 2018-10-01: qty 10

## 2018-10-01 MED ORDER — HEPARIN SOD (PORK) LOCK FLUSH 100 UNIT/ML IV SOLN
500.0000 [IU] | Freq: Once | INTRAVENOUS | Status: AC
  Administered 2018-10-01: 14:00:00 500 [IU] via INTRAVENOUS
  Filled 2018-10-01: qty 5

## 2018-10-01 MED ORDER — SODIUM CHLORIDE 0.9% FLUSH
10.0000 mL | INTRAVENOUS | Status: DC | PRN
  Administered 2018-10-01: 10 mL via INTRAVENOUS
  Filled 2018-10-01: qty 10

## 2018-10-01 MED ORDER — SODIUM CHLORIDE 0.9 % IV SOLN
INTRAVENOUS | Status: DC
  Administered 2018-10-01: 12:00:00 via INTRAVENOUS
  Filled 2018-10-01 (×2): qty 250

## 2018-10-01 MED ORDER — ACETAMINOPHEN 325 MG PO TABS
650.0000 mg | ORAL_TABLET | Freq: Once | ORAL | Status: AC
  Administered 2018-10-01: 12:00:00 650 mg via ORAL

## 2018-10-01 NOTE — Progress Notes (Signed)
Per Dr. Burr Medico [name] placed in symptom management to receive IV fluids, orthostatics done per Dr. Burr Medico. Per V/S Pt. Is orthostatic and will receive 1 liter of IV fluids, Pt. States he has a  Headache as well per Dr. Burr Medico tylenol 650 given to pt.pain reassessed after an hour, Pt. Stated his pain was gone. Pt. tolerated infusion well, V/S stable , port flushed and heparin locked, Huber needle removed with needle intact band-aid placed. Pt. Informed to remove band-aid after 24 hours. Pt. Informed that he has an appointment in infusion on Friday at 2 pm. Pt verbalized understanding. No further problems or concerns noted.

## 2018-10-01 NOTE — Telephone Encounter (Signed)
Scheduled appt per 5/20 los.  Spoke with patient and patient aware of appt date and time.

## 2018-10-01 NOTE — Patient Instructions (Signed)

## 2018-10-01 NOTE — Progress Notes (Signed)
Error

## 2018-10-03 ENCOUNTER — Inpatient Hospital Stay: Payer: Medicaid Other

## 2018-10-03 ENCOUNTER — Other Ambulatory Visit: Payer: Self-pay

## 2018-10-03 VITALS — BP 114/90 | HR 89 | Temp 98.9°F | Resp 18

## 2018-10-03 DIAGNOSIS — Z5112 Encounter for antineoplastic immunotherapy: Secondary | ICD-10-CM | POA: Diagnosis not present

## 2018-10-03 DIAGNOSIS — C161 Malignant neoplasm of fundus of stomach: Secondary | ICD-10-CM

## 2018-10-03 DIAGNOSIS — Z7189 Other specified counseling: Secondary | ICD-10-CM

## 2018-10-03 MED ORDER — HEPARIN SOD (PORK) LOCK FLUSH 100 UNIT/ML IV SOLN
500.0000 [IU] | Freq: Once | INTRAVENOUS | Status: AC | PRN
  Administered 2018-10-03: 16:00:00 500 [IU]
  Filled 2018-10-03: qty 5

## 2018-10-03 MED ORDER — SODIUM CHLORIDE 0.9% FLUSH
10.0000 mL | INTRAVENOUS | Status: DC | PRN
  Administered 2018-10-03: 16:00:00 10 mL
  Filled 2018-10-03: qty 10

## 2018-10-03 MED ORDER — SODIUM CHLORIDE 0.9 % IV SOLN
200.0000 mg | Freq: Once | INTRAVENOUS | Status: AC
  Administered 2018-10-03: 200 mg via INTRAVENOUS
  Filled 2018-10-03: qty 8

## 2018-10-03 MED ORDER — SODIUM CHLORIDE 0.9 % IV SOLN: 2.0000 mg/kg | Freq: Once | INTRAVENOUS | Status: DC

## 2018-10-03 MED ORDER — PROCHLORPERAZINE MALEATE 10 MG PO TABS
ORAL_TABLET | ORAL | Status: AC
  Filled 2018-10-03: qty 1

## 2018-10-03 MED ORDER — PROCHLORPERAZINE MALEATE 10 MG PO TABS
10.0000 mg | ORAL_TABLET | Freq: Once | ORAL | Status: AC
  Administered 2018-10-03: 10 mg via ORAL

## 2018-10-03 MED ORDER — SODIUM CHLORIDE 0.9 % IV SOLN
Freq: Once | INTRAVENOUS | Status: AC
  Administered 2018-10-03: 14:00:00 via INTRAVENOUS
  Filled 2018-10-03: qty 250

## 2018-10-03 NOTE — Progress Notes (Signed)
Keytruda dose clarified w/ Dr. Burr Medico - flat dosing; MD ok'd 200 mg q3 weeks.  Not using weight based dosing.  Orders updated. Kennith Center, Pharm.D., CPP 10/03/2018@2 :44 PM

## 2018-10-03 NOTE — Patient Instructions (Signed)
[facility] Discharge Instructions for Patients Receiving Chemotherapy  Today you received the following chemotherapy agents Keytruda  To help prevent nausea and vomiting after your treatment, we encourage you to take your nausea medication .   If you develop nausea and vomiting that is not controlled by your nausea medication, call the clinic.   BELOW ARE SYMPTOMS THAT SHOULD BE REPORTED IMMEDIATELY:  *FEVER GREATER THAN 100.5 F  *CHILLS WITH OR WITHOUT FEVER  NAUSEA AND VOMITING THAT IS NOT CONTROLLED WITH YOUR NAUSEA MEDICATION  *UNUSUAL SHORTNESS OF BREATH  *UNUSUAL BRUISING OR BLEEDING  TENDERNESS IN MOUTH AND THROAT WITH OR WITHOUT PRESENCE OF ULCERS  *URINARY PROBLEMS  *BOWEL PROBLEMS  UNUSUAL RASH Items with * indicate a potential emergency and should be followed up as soon as possible.  Feel free to call the clinic should you have any questions or concerns. The clinic phone number is [phone].  Please show the CHEMO ALERT CARD at check-in to the Emergency Department and triage nurse.   

## 2018-10-08 ENCOUNTER — Telehealth: Payer: Self-pay

## 2018-10-08 NOTE — Telephone Encounter (Signed)
Called to do covid-19 screening no answer and voicemail not set up.

## 2018-10-09 ENCOUNTER — Ambulatory Visit: Payer: Medicaid Other | Admitting: Family Medicine

## 2018-10-13 ENCOUNTER — Other Ambulatory Visit: Payer: Self-pay

## 2018-10-13 ENCOUNTER — Emergency Department (HOSPITAL_COMMUNITY): Payer: Medicaid Other

## 2018-10-13 ENCOUNTER — Encounter (HOSPITAL_COMMUNITY): Payer: Self-pay | Admitting: Emergency Medicine

## 2018-10-13 ENCOUNTER — Inpatient Hospital Stay (HOSPITAL_COMMUNITY)
Admission: EM | Admit: 2018-10-13 | Discharge: 2018-10-18 | DRG: 208 | Disposition: A | Discharge status: Home Health | Payer: Medicaid Other | Attending: Internal Medicine | Admitting: Internal Medicine

## 2018-10-13 DIAGNOSIS — E43 Unspecified severe protein-calorie malnutrition: Secondary | ICD-10-CM | POA: Diagnosis present

## 2018-10-13 DIAGNOSIS — C169 Malignant neoplasm of stomach, unspecified: Secondary | ICD-10-CM | POA: Diagnosis present

## 2018-10-13 DIAGNOSIS — J9601 Acute respiratory failure with hypoxia: Secondary | ICD-10-CM | POA: Diagnosis present

## 2018-10-13 DIAGNOSIS — R402232 Coma scale, best verbal response, inappropriate words, at arrival to emergency department: Secondary | ICD-10-CM | POA: Diagnosis present

## 2018-10-13 DIAGNOSIS — Z681 Body mass index (BMI) 19 or less, adult: Secondary | ICD-10-CM

## 2018-10-13 DIAGNOSIS — R402432 Glasgow coma scale score 3-8, at arrival to emergency department: Secondary | ICD-10-CM | POA: Diagnosis not present

## 2018-10-13 DIAGNOSIS — I10 Essential (primary) hypertension: Secondary | ICD-10-CM | POA: Diagnosis present

## 2018-10-13 DIAGNOSIS — Z86718 Personal history of other venous thrombosis and embolism: Secondary | ICD-10-CM

## 2018-10-13 DIAGNOSIS — Z9081 Acquired absence of spleen: Secondary | ICD-10-CM | POA: Diagnosis not present

## 2018-10-13 DIAGNOSIS — Z8711 Personal history of peptic ulcer disease: Secondary | ICD-10-CM | POA: Diagnosis not present

## 2018-10-13 DIAGNOSIS — C7931 Secondary malignant neoplasm of brain: Secondary | ICD-10-CM | POA: Diagnosis present

## 2018-10-13 DIAGNOSIS — R64 Cachexia: Secondary | ICD-10-CM | POA: Diagnosis present

## 2018-10-13 DIAGNOSIS — R680 Hypothermia, not associated with low environmental temperature: Secondary | ICD-10-CM | POA: Diagnosis present

## 2018-10-13 DIAGNOSIS — R402352 Coma scale, best motor response, localizes pain, at arrival to emergency department: Secondary | ICD-10-CM | POA: Diagnosis present

## 2018-10-13 DIAGNOSIS — R29734 NIHSS score 34: Secondary | ICD-10-CM | POA: Diagnosis present

## 2018-10-13 DIAGNOSIS — R4182 Altered mental status, unspecified: Secondary | ICD-10-CM | POA: Diagnosis not present

## 2018-10-13 DIAGNOSIS — Z20828 Contact with and (suspected) exposure to other viral communicable diseases: Secondary | ICD-10-CM | POA: Diagnosis present

## 2018-10-13 DIAGNOSIS — R627 Adult failure to thrive: Secondary | ICD-10-CM | POA: Diagnosis present

## 2018-10-13 DIAGNOSIS — Z6822 Body mass index (BMI) 22.0-22.9, adult: Secondary | ICD-10-CM

## 2018-10-13 DIAGNOSIS — G9349 Other encephalopathy: Secondary | ICD-10-CM | POA: Diagnosis not present

## 2018-10-13 DIAGNOSIS — J9690 Respiratory failure, unspecified, unspecified whether with hypoxia or hypercapnia: Secondary | ICD-10-CM | POA: Diagnosis present

## 2018-10-13 DIAGNOSIS — Z0189 Encounter for other specified special examinations: Secondary | ICD-10-CM

## 2018-10-13 DIAGNOSIS — C7800 Secondary malignant neoplasm of unspecified lung: Secondary | ICD-10-CM | POA: Diagnosis present

## 2018-10-13 DIAGNOSIS — R40243 Glasgow coma scale score 3-8, unspecified time: Secondary | ICD-10-CM

## 2018-10-13 DIAGNOSIS — R569 Unspecified convulsions: Secondary | ICD-10-CM | POA: Diagnosis not present

## 2018-10-13 DIAGNOSIS — E871 Hypo-osmolality and hyponatremia: Secondary | ICD-10-CM | POA: Diagnosis present

## 2018-10-13 DIAGNOSIS — G40901 Epilepsy, unspecified, not intractable, with status epilepticus: Secondary | ICD-10-CM | POA: Diagnosis present

## 2018-10-13 DIAGNOSIS — E872 Acidosis: Secondary | ICD-10-CM | POA: Diagnosis present

## 2018-10-13 DIAGNOSIS — Z8249 Family history of ischemic heart disease and other diseases of the circulatory system: Secondary | ICD-10-CM

## 2018-10-13 DIAGNOSIS — Z853 Personal history of malignant neoplasm of breast: Secondary | ICD-10-CM

## 2018-10-13 DIAGNOSIS — Z515 Encounter for palliative care: Secondary | ICD-10-CM

## 2018-10-13 DIAGNOSIS — Z7189 Other specified counseling: Secondary | ICD-10-CM | POA: Diagnosis not present

## 2018-10-13 DIAGNOSIS — D638 Anemia in other chronic diseases classified elsewhere: Secondary | ICD-10-CM | POA: Diagnosis present

## 2018-10-13 DIAGNOSIS — R402132 Coma scale, eyes open, to sound, at arrival to emergency department: Secondary | ICD-10-CM | POA: Diagnosis present

## 2018-10-13 DIAGNOSIS — D630 Anemia in neoplastic disease: Secondary | ICD-10-CM | POA: Diagnosis not present

## 2018-10-13 LAB — URINALYSIS, ROUTINE W REFLEX MICROSCOPIC
Bilirubin Urine: NEGATIVE
Glucose, UA: NEGATIVE mg/dL
Hgb urine dipstick: NEGATIVE
Ketones, ur: NEGATIVE mg/dL
Leukocytes,Ua: NEGATIVE
Nitrite: NEGATIVE
Protein, ur: 30 mg/dL — AB
Specific Gravity, Urine: 1.008 (ref 1.005–1.030)
pH: 6 (ref 5.0–8.0)

## 2018-10-13 LAB — I-STAT CHEM 8, ED
BUN: 20 mg/dL (ref 6–20)
Calcium, Ion: 1.02 mmol/L — ABNORMAL LOW (ref 1.15–1.40)
Chloride: 108 mmol/L (ref 98–111)
Creatinine, Ser: 0.9 mg/dL (ref 0.61–1.24)
Glucose, Bld: 62 mg/dL — ABNORMAL LOW (ref 70–99)
HCT: 40 % (ref 39.0–52.0)
Hemoglobin: 13.6 g/dL (ref 13.0–17.0)
Potassium: 4 mmol/L (ref 3.5–5.1)
Sodium: 134 mmol/L — ABNORMAL LOW (ref 135–145)
TCO2: 16 mmol/L — ABNORMAL LOW (ref 22–32)

## 2018-10-13 LAB — POCT I-STAT 7, (LYTES, BLD GAS, ICA,H+H)
Acid-base deficit: 5 mmol/L — ABNORMAL HIGH (ref 0.0–2.0)
Bicarbonate: 19.5 mmol/L — ABNORMAL LOW (ref 20.0–28.0)
Calcium, Ion: 1.13 mmol/L — ABNORMAL LOW (ref 1.15–1.40)
HCT: 32 % — ABNORMAL LOW (ref 39.0–52.0)
Hemoglobin: 10.9 g/dL — ABNORMAL LOW (ref 13.0–17.0)
O2 Saturation: 100 %
Patient temperature: 95.1
Potassium: 4.2 mmol/L (ref 3.5–5.1)
Sodium: 137 mmol/L (ref 135–145)
TCO2: 20 mmol/L — ABNORMAL LOW (ref 22–32)
pCO2 arterial: 30.5 mmHg — ABNORMAL LOW (ref 32.0–48.0)
pH, Arterial: 7.405 (ref 7.350–7.450)
pO2, Arterial: 622 mmHg — ABNORMAL HIGH (ref 83.0–108.0)

## 2018-10-13 LAB — CBC WITH DIFFERENTIAL/PLATELET
Abs Immature Granulocytes: 0 10*3/uL (ref 0.00–0.07)
Basophils Absolute: 0 10*3/uL (ref 0.0–0.1)
Basophils Relative: 1 %
Eosinophils Absolute: 0 10*3/uL (ref 0.0–0.5)
Eosinophils Relative: 0 %
HCT: 37 % — ABNORMAL LOW (ref 39.0–52.0)
Hemoglobin: 12.1 g/dL — ABNORMAL LOW (ref 13.0–17.0)
Lymphocytes Relative: 31 %
Lymphs Abs: 1.5 10*3/uL (ref 0.7–4.0)
MCH: 29.1 pg (ref 26.0–34.0)
MCHC: 32.7 g/dL (ref 30.0–36.0)
MCV: 88.9 fL (ref 80.0–100.0)
Monocytes Absolute: 0 10*3/uL — ABNORMAL LOW (ref 0.1–1.0)
Monocytes Relative: 0 %
Neutro Abs: 3.2 10*3/uL (ref 1.7–7.7)
Neutrophils Relative %: 68 %
Platelets: 245 10*3/uL (ref 150–400)
RBC: 4.16 MIL/uL — ABNORMAL LOW (ref 4.22–5.81)
RDW: 22.4 % — ABNORMAL HIGH (ref 11.5–15.5)
WBC: 4.7 10*3/uL (ref 4.0–10.5)
nRBC: 0.6 % — ABNORMAL HIGH (ref 0.0–0.2)

## 2018-10-13 LAB — COMPREHENSIVE METABOLIC PANEL
ALT: UNDETERMINED U/L (ref 0–44)
AST: 33 U/L (ref 15–41)
Albumin: 2.2 g/dL — ABNORMAL LOW (ref 3.5–5.0)
Alkaline Phosphatase: 53 U/L (ref 38–126)
Anion gap: 18 — ABNORMAL HIGH (ref 5–15)
BUN: 19 mg/dL (ref 6–20)
CO2: 13 mmol/L — ABNORMAL LOW (ref 22–32)
Calcium: 8.7 mg/dL — ABNORMAL LOW (ref 8.9–10.3)
Chloride: 106 mmol/L (ref 98–111)
Creatinine, Ser: 1.12 mg/dL (ref 0.61–1.24)
GFR calc Af Amer: >60 mL/min (ref >60)
GFR calc non Af Amer: >60 mL/min (ref >60)
Glucose, Bld: 64 mg/dL — ABNORMAL LOW (ref 70–99)
Potassium: 4.4 mmol/L (ref 3.5–5.1)
Sodium: 137 mmol/L (ref 135–145)
Total Bilirubin: 0.8 mg/dL (ref 0.3–1.2)
Total Protein: 5.1 g/dL — ABNORMAL LOW (ref 6.5–8.1)

## 2018-10-13 LAB — PROTIME-INR
INR: 1.3 — ABNORMAL HIGH (ref 0.8–1.2)
Prothrombin Time: 15.5 seconds — ABNORMAL HIGH (ref 11.4–15.2)

## 2018-10-13 LAB — LACTIC ACID, PLASMA
Lactic Acid, Venous: 7.1 mmol/L (ref 0.5–1.9)
Lactic Acid, Venous: 3.8 mmol/L (ref 0.5–1.9)

## 2018-10-13 LAB — MRSA PCR SCREENING: MRSA by PCR: NEGATIVE

## 2018-10-13 LAB — MAGNESIUM: Magnesium: 2.5 mg/dL — ABNORMAL HIGH (ref 1.7–2.4)

## 2018-10-13 LAB — AMMONIA: Ammonia: 80 umol/L — ABNORMAL HIGH (ref 9–35)

## 2018-10-13 LAB — SARS CORONAVIRUS 2 BY RT PCR (HOSPITAL ORDER, PERFORMED IN ~~LOC~~ HOSPITAL LAB): SARS Coronavirus 2: NEGATIVE

## 2018-10-13 LAB — TROPONIN I: Troponin I: <0.03 ng/mL (ref <0.03)

## 2018-10-13 MED ORDER — PROPOFOL 1000 MG/100ML IV EMUL
5.0000 ug/kg/min | INTRAVENOUS | Status: DC
  Administered 2018-10-13: 5 ug/kg/min via INTRAVENOUS
  Filled 2018-10-13: qty 100

## 2018-10-13 MED ORDER — FENTANYL CITRATE (PF) 100 MCG/2ML IJ SOLN
50.0000 ug | Freq: Once | INTRAMUSCULAR | Status: AC
  Administered 2018-10-13: 50 ug via INTRAVENOUS
  Filled 2018-10-13: qty 2

## 2018-10-13 MED ORDER — ORAL CARE MOUTH RINSE
15.0000 mL | OROMUCOSAL | Status: DC
  Administered 2018-10-13 – 2018-10-14 (×5): 15 mL via OROMUCOSAL

## 2018-10-13 MED ORDER — FENTANYL BOLUS VIA INFUSION
50.0000 ug | INTRAVENOUS | Status: DC | PRN
  Filled 2018-10-13: qty 50

## 2018-10-13 MED ORDER — PHENYLEPHRINE 40 MCG/ML (10ML) SYRINGE FOR IV PUSH (FOR BLOOD PRESSURE SUPPORT)
PREFILLED_SYRINGE | INTRAVENOUS | Status: AC
  Filled 2018-10-13: qty 10

## 2018-10-13 MED ORDER — FAMOTIDINE IN NACL 20-0.9 MG/50ML-% IV SOLN
20.0000 mg | Freq: Two times a day (BID) | INTRAVENOUS | Status: DC
  Administered 2018-10-13: 20 mg via INTRAVENOUS
  Filled 2018-10-13: qty 50

## 2018-10-13 MED ORDER — FENTANYL 2500MCG IN NS 250ML (10MCG/ML) PREMIX INFUSION
50.0000 ug/h | INTRAVENOUS | Status: DC
  Administered 2018-10-13: 50 ug/h via INTRAVENOUS
  Filled 2018-10-13: qty 250

## 2018-10-13 MED ORDER — ROCURONIUM BROMIDE 50 MG/5ML IV SOLN
60.0000 mg | Freq: Once | INTRAVENOUS | Status: AC
  Administered 2018-10-13: 60 mg via INTRAVENOUS
  Filled 2018-10-13: qty 6

## 2018-10-13 MED ORDER — DEXTROSE-NACL 5-0.9 % IV SOLN
INTRAVENOUS | Status: DC
  Administered 2018-10-13 – 2018-10-14 (×3): via INTRAVENOUS

## 2018-10-13 MED ORDER — ETOMIDATE 2 MG/ML IV SOLN
20.0000 mg | Freq: Once | INTRAVENOUS | Status: AC
  Administered 2018-10-13: 20 mg via INTRAVENOUS

## 2018-10-13 MED ORDER — CHLORHEXIDINE GLUCONATE CLOTH 2 % EX PADS
6.0000 | MEDICATED_PAD | Freq: Every day | CUTANEOUS | Status: DC
  Administered 2018-10-13: 6 via TOPICAL

## 2018-10-13 MED ORDER — DEXAMETHASONE SODIUM PHOSPHATE 4 MG/ML IJ SOLN
4.0000 mg | Freq: Four times a day (QID) | INTRAMUSCULAR | Status: DC
  Administered 2018-10-13 – 2018-10-18 (×20): 4 mg via INTRAVENOUS
  Filled 2018-10-13 (×21): qty 1

## 2018-10-13 MED ORDER — PHENYLEPHRINE 40 MCG/ML (10ML) SYRINGE FOR IV PUSH (FOR BLOOD PRESSURE SUPPORT)
100.0000 ug | PREFILLED_SYRINGE | Freq: Once | INTRAVENOUS | Status: AC
  Administered 2018-10-13: 100 ug via INTRAVENOUS

## 2018-10-13 MED ORDER — SODIUM CHLORIDE 0.9 % IV SOLN
2000.0000 mg | INTRAVENOUS | Status: AC
  Administered 2018-10-13: 2000 mg via INTRAVENOUS
  Filled 2018-10-13: qty 20

## 2018-10-13 MED ORDER — CHLORHEXIDINE GLUCONATE 0.12% ORAL RINSE (MEDLINE KIT)
15.0000 mL | Freq: Two times a day (BID) | OROMUCOSAL | Status: DC
  Administered 2018-10-13 – 2018-10-14 (×2): 15 mL via OROMUCOSAL

## 2018-10-13 NOTE — Consult Note (Addendum)
Neurology Consultation Reason for Consult: Unresponsiveness Referring Physician: Eugenio Hoes  CC: Unresponsiveness  History is obtained from: EMS  HPI: Ryan Collins is a 58 y.o. male with a history of gastric cancer with metastatic disease to the lungs who presents with unresponsiveness.  He apparently has not been feeling right for the past week, but worsening lethargy over the course of the day, followed by unresponsiveness with EMS.  He also reported left-sided gaze.  Mother reports that he has not been doing well for a week. She has been trying to get him to go to the hospital, but he was refusing. Tonight, when she could not get a hold of him, she sent her other son to check on him and he found him lethargic and therefore he called 911.    LKW: Unclear, last week tpa given?: no, intracranial mets    ROS: Unable to obtain due to altered mental status.   Past Medical History:  Diagnosis Date  . Cancer [facility])    gastric cancer  . DVT (deep venous thrombosis) ([place]) 2011  . GI bleeding 2001  . History of DVT of lower extremity   . Hypertension   . Melena 05/2017  . Stomach ulcer    Clinically suspected; No EGD confirmation as of 06/03/17     Family History  Problem Relation Age of Onset  . Hypertension Mother   . Cancer Sister 20       breast     Social History:  reports that he has never smoked. He has never used smokeless tobacco. He reports current alcohol use of about 5.0 standard drinks of alcohol per week. He reports current drug use. Frequency: 2.00 times per week. Drug: Marijuana.   Exam: Current vital signs: BP (!) 117/97   Pulse (!) 115   Temp (!) 94.8 F (34.9 C) (Temporal)   Resp 20   Ht 5\' 10"  (1.778 m)   Wt 72.6 kg   SpO2 100%   BMI 22.96 kg/m  Vital signs in last 24 hours: Temp:  [94.8 F (34.9 C)] 94.8 F (34.9 C) (06/01 1919) Pulse Rate:  [115] 115 (06/01 1919) Resp:  [20-31] 20 (06/01 1930) BP: (63-117)/(52-97) 117/97 (06/01 1930) SpO2:   [100 %] 100 % (06/01 1919) Weight:  [72.6 kg] 72.6 kg (06/01 1907)   Physical Exam  Constitutional: Appears well-developed and well-nourished.  Psych: unresponsive Eyes: No scleral injection HENT: No OP obstrucion Head: Normocephalic.  Cardiovascular: Normal rate and regular rhythm.  Respiratory: slow rate GI: Soft.  No distension. There is no tenderness.  Skin: WDI  Neuro: Mental Status: Patient is comatose. Cranial Nerves: II: Does not blink to threat. Pupils are reactive bilaterally III,IV, VI: Eyes are disconjugate, looking laterally bilaterally V: VII: Corneal intact on the right, absent on the left Motor: No movement to noxious stimulation Sensory: As above deep Tendon Reflexes: 2+ and symmetric in the biceps and patellae.  Plantars: Toes are downgoing bilaterally.  Cerebellar: Does not perform   I have reviewed labs in epic and the results pertinent to this consultation are: Chem-8-mild hyponatremia, but otherwise unremarkable  I have reviewed the images obtained: CT head multiple intracranial metastasis, some with intra-metastasis hemorrhage  Impression: 58 year old male with widely metastatic cancer and poor prognosis before the new diagnosis of intracranial metastasis.  It is possible that he had a seizure tonight with postictal state upon arrival. He was starting to move his legs bilaterally just prior to intubation and gaze deviation resolved, but his mental  status still necessitated intubation.    I discussed his poor prognosis with his mother and encouraged DNR status, however she was not ready to make that decision at this time.   Recommendations: 1) Keppra load 2g x 1, followed by 1g BID 2) EEG, routine in AM 3) Decadron 10mg  IV x 1, then 4mg  Q6H 4) repeat CT in AM 5) Palliative care consultation   This patient is critically ill and at significant risk of neurological worsening, death and care requires constant monitoring of vital signs,  hemodynamics,respiratory and cardiac monitoring, neurological assessment, discussion with family, other specialists and medical decision making of high complexity. I spent 65 minutes of neurocritical care time  in the care of  this patient. This was time spent independent of any time provided by nurse practitioner or PA.  Roland Rack, MD [facility] [phone]  If 7pm- 7am, please page neurology on call as listed in [place]. 10/13/2018  8:22 PM

## 2018-10-13 NOTE — ED Notes (Signed)
Transported pt. to CT scan with RT .

## 2018-10-13 NOTE — Progress Notes (Signed)
Patient transferred to 9U76 w/o complications noted. Uneventful trip. Report given to unit RRT.

## 2018-10-13 NOTE — H&P (Signed)
NAME:  Ryan Collins, MRN:  [id number], DOB:  [birth year], LOS: 0 ADMISSION DATE:  10/13/2018, CONSULTATION DATE: 10/13/2018 REFERRING MD:  ER, CHIEF COMPLAINT:  Respiratory failure with lethargy for several days  Brief History   58 year old male with widely metastatic gastric carcinoma developed respiratory distress in route to the hospital requiring intubation  History of present illness   Patient is a 58 year old black male diagnosed with gastric carcinoma approximately a year ago been followed by oncology with progressive disease.  He was being brought to the emergency room by ENT requesting transfer because he has not been feeling well for some time.  In route he developed worsening respiratory distress requiring intubation on arrival to the emergency room.  On my evaluation patient is unresponsive having received sedation.  He is intubated.  Vital signs are stable save a core body temperature of 95 degrees. Most recent CT scan evaluation done the early part of May shows small bilateral pulmonary nodules with enlarging portacaval nodules.  Overall impression was worsening disease.  Patient has been receiving Keytruda as his primary chemotherapeutic agent. Pertinent laboratory results are blood gas post intubation.  Lactic acid is 7.1.  COVID is pending.    Past Medical History   . Cancer [facility])    gastric cancer  . DVT (deep venous thrombosis) ([place]) 2011  . GI bleeding 2001  . History of DVT of lower extremity   . Hypertension   . Melena 05/2017  . Stomach ulcer      Significant Hospital Events   Intubated admitted to the hospital October 13, 2018  Consults:  Neurology  Procedures:  Intubated  Significant Diagnostic Tests:  NA  Micro Data:  NA  Antimicrobials:  NA  Interim history/subjective:  NA  Objective   Blood pressure (!) 136/92, pulse 78, temperature (!) 95.1 F (35.1 C), temperature source Rectal, resp. rate 20, height 5\' 10"  (1.778 m), weight 72.6 kg, SpO2 100  %.        Intake/Output Summary (Last 24 hours) at 10/13/2018 2051 Last data filed at 10/13/2018 2007 Gross per 24 hour  Intake 2000 ml  Output -  Net 2000 ml   Filed Weights   10/13/18 1907  Weight: 72.6 kg    Examination: General: Markedly cachectic black male unresponsive and intubated HENT: Within normal limits Lungs: Clear Cardiovascular: Tachycardic hyperdynamic Abdomen: Scaphoid benign Extremities: Cachectic Neuro: Sedated GU: Normal  Resolved Hospital Problem list   NA  Assessment & Plan:  1.  Progressive lethargy over 2 weeks 2.  Acute respiratory failure: Current ventilatory support 3.  Widely metastatic gastric carcinoma.  With changes in mental status will obtain CT scan of the brain.  Known metastatic disease throughout the abdomen and bilateral pulmonary nodules. 4.  Hypothermic 5.  Lactic acidosis  Will attempt to get hold of his mother.  Apparent neurology has spoken with her and based on their account seems unable to make a decision in regards to aggressiveness of care at this time.  Will continue mechanical ventilation maintain full CODE STATUS for now.  Patient's prognosis is extremely poor with a respiratory arrest on a background of widely metastatic gastric cancer has been progressive despite appropriate chemotherapy.   Best practice:  Diet: N.p.o. Pain/Anxiety/Delirium protocol (if indicated): Fentanyl if needed VAP protocol (if indicated): S DVT prophylaxis: SCDs GI prophylaxis: Pepcid Glucose control: Monitor Mobility: Bedrest Code Status: Full Family Communication: Pending Disposition:   Labs   CBC: Recent Labs  Lab 10/13/18 1921 10/13/18 1950 10/13/18  2042  WBC 4.7  --   --   NEUTROABS 3.2  --   --   HGB 12.1* 13.6 10.9*  HCT 37.0* 40.0 32.0*  MCV 88.9  --   --   PLT 245  --   --     Basic Metabolic Panel: Recent Labs  Lab 10/13/18 1950 10/13/18 2042  NA 134* 137  K 4.0 4.2  CL 108  --   GLUCOSE 62*  --   BUN 20  --    CREATININE 0.90  --    GFR: Estimated Creatinine Clearance: 93 mL/min (by C-G formula based on SCr of 0.9 mg/dL). Recent Labs  Lab 10/13/18 1921  WBC 4.7    Liver Function Tests: No results for input(s): AST, ALT, ALKPHOS, BILITOT, PROT, ALBUMIN in the last 168 hours. No results for input(s): LIPASE, AMYLASE in the last 168 hours. No results for input(s): AMMONIA in the last 168 hours.  ABG    Component Value Date/Time   PHART 7.405 10/13/2018 2042   PCO2ART 30.5 (L) 10/13/2018 2042   PO2ART 622.0 (H) 10/13/2018 2042   HCO3 19.5 (L) 10/13/2018 2042   TCO2 20 (L) 10/13/2018 2042   ACIDBASEDEF 5.0 (H) 10/13/2018 2042   O2SAT 100.0 10/13/2018 2042     Coagulation Profile: Recent Labs  Lab 10/13/18 1921  INR 1.3*    Cardiac Enzymes: No results for input(s): CKTOTAL, CKMB, CKMBINDEX, TROPONINI in the last 168 hours.  HbA1C: No results found for: HGBA1C  CBG: No results for input(s): GLUCAP in the last 168 hours.  Review of Systems:   Unable to obtain patient sedated and intubated  Past Medical History  He,  has a past medical history of Cancer ([place]), DVT (deep venous thrombosis) ([place]) (2011), GI bleeding (2001), History of DVT of lower extremity, Hypertension, Melena (05/2017), and Stomach ulcer.   Surgical History    Past Surgical History:  Procedure Laterality Date  . ESOPHAGOGASTRODUODENOSCOPY  06/04/2017  . ESOPHAGOGASTRODUODENOSCOPY N/A 06/04/2017   Procedure: ESOPHAGOGASTRODUODENOSCOPY (EGD);  Surgeon: Carol Ada, MD;  Location: [place];  Service: Endoscopy;  Laterality: N/A;  . GASTRECTOMY N/A 02/25/2018   Procedure: TOTAL GASTRECTOMY WITH J TUBE PLACEMENT;  Surgeon: [name], MD;  Location: [place];  Service: General;  Laterality: N/A;  . IR FLUORO GUIDE PORT INSERTION RIGHT  06/20/2017  . IR US GUIDE VASC ACCESS RIGHT  06/20/2017  . JEJUNOSTOMY N/A 02/25/2018   Procedure: JEJUNOSTOMY TUBE;  Surgeon: [name], MD;  Location: [place];  Service:  General;  Laterality: N/A;  . LAPAROSCOPY N/A 02/25/2018   Procedure: LAPAROSCOPY DIAGNOSTIC;  Surgeon: [name], MD;  Location: [place];  Service: General;  Laterality: N/A;  . SPLENECTOMY, TOTAL N/A 02/25/2018   Procedure: SPLENECTOMY;  Surgeon: [name], MD;  Location: [place];  Service: General;  Laterality: N/A;     Social History   reports that he has never smoked. He has never used smokeless tobacco. He reports current alcohol use of about 5.0 standard drinks of alcohol per week. He reports current drug use. Frequency: 2.00 times per week. Drug: Marijuana.   Family History   His family history includes Cancer (age of onset: 31) in his sister; Hypertension in his mother.   Allergies No Known Allergies   Home Medications  Prior to Admission medications   Medication Sig Start Date End Date Taking? Authorizing Provider  acetaminophen (TYLENOL) 500 MG tablet Take 1,000 mg by mouth every 6 (six) hours as needed for mild  pain.     [provider]  Amino Acids-Protein Hydrolys (FEEDING SUPPLEMENT, PRO-STAT SUGAR FREE 64,) LIQD Place 30 mLs into feeding tube 3 (three) times daily. 03/04/18   [name], MD  amLODipine (NORVASC) 2.5 MG tablet Take 1 tablet (2.5 mg total) by mouth daily. 09/10/18   [name], MD  cyclobenzaprine (FLEXERIL) 10 MG tablet Take 1 tablet (10 mg total) by mouth 3 (three) times daily as needed for muscle spasms. 09/10/18   [name], MD  lidocaine-prilocaine (EMLA) cream Apply 1 application topically as needed (for port). 07/29/18   [name], MD  magic mouthwash w/lidocaine SOLN Take 5 mLs by mouth 4 (four) times daily as needed for mouth pain. 06/05/18   Tanner, Lyndon Code., PA-C  ondansetron (ZOFRAN) 8 MG tablet Take 8 mg by mouth every 8 (eight) hours as needed.    [provider]  potassium chloride SA (K-DUR,KLOR-CON) 20 MEQ tablet Take 1 tablet (20 mEq total) by mouth 2 (two) times daily. 07/29/18   [name], MD  prochlorperazine (COMPAZINE)  10 MG tablet Take 10 mg by mouth every 6 (six) hours as needed.    [provider]  vitamin B-12 (CYANOCOBALAMIN) 1000 MCG tablet Take 1 tablet (1,000 mcg total) by mouth daily. 12/26/17   [name], MD     Critical care time: 35 minutes spent in evaluation and formulating critical care plan

## 2018-10-13 NOTE — Progress Notes (Signed)
CRITICAL VALUE ALERT  Critical Value:  Lactic Acid 3.8   Date & Time Notied:  10/13/2018 & 2321  Provider Notified: Elink RN - [place]   Orders Received/Actions taken: No new orders; value lower than previous from 7.1 to 3.8   Leticia Clas, RN BSN

## 2018-10-13 NOTE — ED Notes (Addendum)
Delay in CT scan due to portable chest x-ray post intubation ,blood specimen collection / waiting for RT to transport with RN.

## 2018-10-13 NOTE — ED Notes (Signed)
Ryan Collins [phone] pts mother, update

## 2018-10-13 NOTE — ED Triage Notes (Signed)
Patient arrived with EMS from home family reported lethargy /decreased LOC this evening , EMS reported respiratory distress enroute with left left side gaze , being bagged at arrival , intubated by EDP at arrival with RT.

## 2018-10-13 NOTE — ED Provider Notes (Signed)
[facility] EMERGENCY DEPARTMENT Provider Note   CSN: [id number] Arrival date & time: 10/13/18  1905    History   Chief Complaint Chief Complaint  Patient presents with  . Lethargy / Respiratory Distress    HPI Ryan Collins is a 58 y.o. male.     HPI   65y male brought in by EMS since unresponsive.  He has a history of metastatic gastric cancer.  Apparently his had a continued decline over the past week and then today acutely worsened.  On EMS arrival he was very poorly responsive.  They question a possible left-sided gaze.  He has been a code stroke because of this.  Arrival to the emergency room he had a GCS of 3.  He was intubated for airway protection.  When RSI meds are being pushed he did have some spontaneous movement of his right lower extremity and did shake his head a little bit.  Past Medical History:  Diagnosis Date  . Cancer [facility])    gastric cancer  . DVT (deep venous thrombosis) ([place]) 2011  . GI bleeding 2001  . History of DVT of lower extremity   . Hypertension   . Melena 05/2017  . Stomach ulcer    Clinically suspected; No EGD confirmation as of 06/03/17    Patient Active Problem List   Diagnosis Date Noted  . Goals of care, counseling/discussion 09/30/2018  . Port-A-Cath in place 05/26/2018  . Jejunostomy tube present ([place]) 03/04/2018  . S/P total gastrectomy and Roux-en-Y esophagojejunal anastomosis 03/04/2018  . Protein-calorie malnutrition, severe 02/27/2018  . Essential hypertension 06/12/2017  . Gastric cancer ([place]) 06/10/2017  . Iron deficiency anemia 06/03/2017  . History of DVT (deep vein thrombosis) 06/03/2017  . Vitamin B12 deficiency 06/03/2017  . Hypokalemia 06/03/2017  . Alcohol abuse 06/03/2017    Past Surgical History:  Procedure Laterality Date  . ESOPHAGOGASTRODUODENOSCOPY  06/04/2017  . ESOPHAGOGASTRODUODENOSCOPY N/A 06/04/2017   Procedure: ESOPHAGOGASTRODUODENOSCOPY (EGD);  Surgeon: Carol Ada, MD;   Location: [place];  Service: Endoscopy;  Laterality: N/A;  . GASTRECTOMY N/A 02/25/2018   Procedure: TOTAL GASTRECTOMY WITH J TUBE PLACEMENT;  Surgeon: [name], MD;  Location: [place];  Service: General;  Laterality: N/A;  . IR FLUORO GUIDE PORT INSERTION RIGHT  06/20/2017  . IR US GUIDE VASC ACCESS RIGHT  06/20/2017  . JEJUNOSTOMY N/A 02/25/2018   Procedure: JEJUNOSTOMY TUBE;  Surgeon: [name], MD;  Location: [place];  Service: General;  Laterality: N/A;  . LAPAROSCOPY N/A 02/25/2018   Procedure: LAPAROSCOPY DIAGNOSTIC;  Surgeon: [name], MD;  Location: [place];  Service: General;  Laterality: N/A;  . SPLENECTOMY, TOTAL N/A 02/25/2018   Procedure: SPLENECTOMY;  Surgeon: [name], MD;  Location: [place];  Service: General;  Laterality: N/A;        Home Medications    Prior to Admission medications   Medication Sig Start Date End Date Taking? Authorizing Provider  acetaminophen (TYLENOL) 500 MG tablet Take 1,000 mg by mouth every 6 (six) hours as needed for mild pain.     [provider]  Amino Acids-Protein Hydrolys (FEEDING SUPPLEMENT, PRO-STAT SUGAR FREE 64,) LIQD Place 30 mLs into feeding tube 3 (three) times daily. 03/04/18   [name], MD  amLODipine (NORVASC) 2.5 MG tablet Take 1 tablet (2.5 mg total) by mouth daily. 09/10/18   [name], MD  cyclobenzaprine (FLEXERIL) 10 MG tablet Take 1 tablet (10 mg total) by mouth 3 (three) times daily as needed for muscle  spasms. 09/10/18   [name], MD  lidocaine-prilocaine (EMLA) cream Apply 1 application topically as needed (for port). 07/29/18   [name], MD  magic mouthwash w/lidocaine SOLN Take 5 mLs by mouth 4 (four) times daily as needed for mouth pain. 06/05/18   Tanner, Lyndon Code., PA-C  ondansetron (ZOFRAN) 8 MG tablet Take 8 mg by mouth every 8 (eight) hours as needed.    [provider]  potassium chloride SA (K-DUR,KLOR-CON) 20 MEQ tablet Take 1 tablet (20 mEq total) by mouth 2 (two) times daily.  07/29/18   [name], MD  prochlorperazine (COMPAZINE) 10 MG tablet Take 10 mg by mouth every 6 (six) hours as needed.    [provider]  vitamin B-12 (CYANOCOBALAMIN) 1000 MCG tablet Take 1 tablet (1,000 mcg total) by mouth daily. 12/26/17   [name], MD    Family History Family History  Problem Relation Age of Onset  . Hypertension Mother   . Cancer Sister 52       breast    Social History Social History   Tobacco Use  . Smoking status: Never Smoker  . Smokeless tobacco: Never Used  Substance Use Topics  . Alcohol use: Yes    Alcohol/week: 5.0 standard drinks    Types: 5 Cans of beer per week    Comment: 05/2017 i QUIT DRINKING 4 MONTHS AGO "  . Drug use: Yes    Frequency: 2.0 times per week    Types: Marijuana    Comment: last use: 02/17/2018     Allergies   Patient has no known allergies.   Review of Systems Review of Systems  Level 5 caveat this patient is unresponsive. Physical Exam Updated Vital Signs BP (!) 126/106   Pulse (!) 115   Temp (!) 94.8 F (34.9 C) (Temporal)   Resp 16   Ht 5\' 10"  (1.778 m)   Wt 72.6 kg   SpO2 100%   BMI 22.96 kg/m   Physical Exam Vitals signs and nursing note reviewed.  Constitutional:      General: He is in acute distress.     Appearance: He is well-developed. He is toxic-appearing.     Comments: Cachectic.  Chronically ill-appearing.  HENT:     Head: Normocephalic and atraumatic.  Eyes:     General:        Right eye: No discharge.        Left eye: No discharge.     Conjunctiva/sclera: Conjunctivae normal.  Neck:     Musculoskeletal: Neck supple.  Cardiovascular:     Rate and Rhythm: Normal rate and regular rhythm.     Heart sounds: Normal heart sounds. No murmur. No friction rub. No gallop.      Comments: Port R chest Abdominal:     General: There is no distension.     Palpations: Abdomen is soft.     Tenderness: There is no abdominal tenderness.  Musculoskeletal:        General: No tenderness.   Skin:    General: Skin is warm and dry.  Neurological:     Comments: Some spontaneous movement of LLE and moving head weakly when RSI meds given. GCS otherwise was 3.       ED Treatments / Results  Labs (all labs ordered are listed, but only abnormal results are displayed) Labs Reviewed  CBC WITH DIFFERENTIAL/PLATELET - Abnormal; Notable for the following components:      Result Value   RBC 4.16 (*)  Hemoglobin 12.1 (*)    HCT 37.0 (*)    RDW 22.4 (*)    nRBC 0.6 (*)    Monocytes Absolute 0.0 (*)    All other components within normal limits  COMPREHENSIVE METABOLIC PANEL - Abnormal; Notable for the following components:   CO2 13 (*)    Glucose, Bld 64 (*)    Calcium 8.7 (*)    Total Protein 5.1 (*)    Albumin 2.2 (*)    Anion gap 18 (*)    All other components within normal limits  PROTIME-INR - Abnormal; Notable for the following components:   Prothrombin Time 15.5 (*)    INR 1.3 (*)    All other components within normal limits  LACTIC ACID, PLASMA - Abnormal; Notable for the following components:   Lactic Acid, Venous 7.1 (*)    All other components within normal limits  URINALYSIS, ROUTINE W REFLEX MICROSCOPIC - Abnormal; Notable for the following components:   APPearance HAZY (*)    Protein, ur 30 (*)    Bacteria, UA FEW (*)    All other components within normal limits  AMMONIA - Abnormal; Notable for the following components:   Ammonia 80 (*)    All other components within normal limits  MAGNESIUM - Abnormal; Notable for the following components:   Magnesium 2.5 (*)    All other components within normal limits  I-STAT CHEM 8, ED - Abnormal; Notable for the following components:   Sodium 134 (*)    Glucose, Bld 62 (*)    Calcium, Ion 1.02 (*)    TCO2 16 (*)    All other components within normal limits  POCT I-STAT 7, (LYTES, BLD GAS, ICA,H+H) - Abnormal; Notable for the following components:   pCO2 arterial 30.5 (*)    pO2, Arterial 622.0 (*)     Bicarbonate 19.5 (*)    TCO2 20 (*)    Acid-base deficit 5.0 (*)    Calcium, Ion 1.13 (*)    HCT 32.0 (*)    Hemoglobin 10.9 (*)    All other components within normal limits  SARS CORONAVIRUS 2 (HOSPITAL ORDER, [facility])  TROPONIN I  LACTIC ACID, PLASMA  HIV ANTIBODY (ROUTINE TESTING W REFLEX)  BASIC METABOLIC PANEL  CBC  I-STAT ARTERIAL BLOOD GAS, ED    EKG None  Radiology Dg Chest Portable 1 View  Result Date: 10/13/2018 CLINICAL DATA:  Gastric cancer.  Patient intubated. EXAM: PORTABLE CHEST 1 VIEW COMPARISON:  CT scan Sep 17, 2018 FINDINGS: Pulmonary nodularity was better assessed on the previous CT scan and is new since July 10, 2017, consistent with patient's known pulmonary metastases. An ETT terminates in good position. A right Port-A-Cath terminates in the central SVC. No pneumothorax. Skin folds over the upper right chest. No acute infiltrate. IMPRESSION: 1. Support apparatus as above. 2. Known pulmonary metastases. Electronically Signed   By: Dorise Bullion III M.D   On: 10/13/2018 19:46   Ct Head Code Stroke Wo Contrast`  Result Date: 10/13/2018 CLINICAL DATA:  Code stroke.  Altered mental status EXAM: CT HEAD WITHOUT CONTRAST TECHNIQUE: Contiguous axial images were obtained from the base of the skull through the vertex without intravenous contrast. COMPARISON:  None. FINDINGS: Brain: There are multiple hemorrhagic lesions, the largest of which is located in the left temporal lobe and measures 16 mm. There are at least 5 lesions. No infratentorial lesions are visible. There is no midline shift or other mass effect. Moderate  edema surrounds all lesions. No hydrocephalus. Vascular: Atherosclerotic calcification of the internal carotid arteries at the skull base. No abnormal hyperdensity of the major intracranial arteries or dural venous sinuses. Skull: The visualized skull base, calvarium and extracranial soft tissues are normal. Sinuses/Orbits: No  fluid levels or advanced mucosal thickening of the visualized paranasal sinuses. No mastoid or middle ear effusion. The orbits are normal. IMPRESSION: 1. At least 5 hyperdense metastases with surrounding edema, greatest in the left temporal lobe. Small volume hemorrhage is associated with most of the lesions. 2. ASPECTS is not applicable in the setting of hemorrhage These results were communicated to Dr. Roland Rack at 8:02 pm on 10/13/2018 by text page via the [facility] messaging system. Electronically Signed   By: Ulyses Jarred M.D.   On: 10/13/2018 20:06    Procedures Procedures (including critical care time)  CRITICAL CARE Performed by: Virgel Manifold Total critical care time: 40 minutes Critical care time was exclusive of separately billable procedures and treating other patients. Critical care was necessary to treat or prevent imminent or life-threatening deterioration. Critical care was time spent personally by me on the following activities: development of treatment plan with patient and/or surrogate as well as nursing, discussions with consultants, evaluation of patient's response to treatment, examination of patient, obtaining history from patient or surrogate, ordering and performing treatments and interventions, ordering and review of laboratory studies, ordering and review of radiographic studies, pulse oximetry and re-evaluation of patient's condition.  INTUBATION Performed by: Virgel Manifold  Required items: required blood products, implants, devices, and special equipment available Patient identity confirmed: provided demographic data and hospital-assigned identification number Time out: Immediately prior to procedure a "time out" was called to verify the correct patient, procedure, equipment, support staff and site/side marked as required.  Indications: airway protection  Intubation method: Glidescope Laryngoscopy   Preoxygenation: BVM  Sedatives: Etomidate Paralytic:  rocuronium  Tube Size: 7.5 cuffed  Post-procedure assessment: chest rise and ETCO2 monitor Breath sounds: equal and absent over the epigastrium Tube secured with: ETT holder Chest x-ray interpreted by radiologist and me.  Chest x-ray findings: endotracheal tube in appropriate position  Patient tolerated the procedure well with no immediate complications.    Medications Ordered in ED Medications  dexamethasone (DECADRON) injection 4 mg (has no administration in time range)  dextrose 5 %-0.9 % sodium chloride infusion (has no administration in time range)  etomidate (AMIDATE) injection 20 mg (20 mg Intravenous Given 10/13/18 1914)  rocuronium (ZEMURON) injection 60 mg (60 mg Intravenous Given 10/13/18 1914)  PHENYLephrine 40 mcg/ml in normal saline Adult IV Push Syringe (100 mcg Intravenous Given 10/13/18 1933)     Initial Impression / Assessment and Plan / ED Course  I have reviewed the triage vital signs and the nursing notes.  Pertinent labs & imaging results that were available during my care of the patient were reviewed by me and considered in my medical decision making (see chart for details).  64yM with AMS. Made code stroke for possible L sided gaze. Intubated on arrival airway protection. Hypotensive post intubation. Given 2L NS and push dose of phenylephrine. CT with numerous metastatic lesions. Pt is Full Code as of now. Evaluated by neurology. Discussed with CCM for admit.   Semir Brill was evaluated in Emergency Department on 10/13/2018 for the symptoms described in the history of present illness. He was evaluated in the context of the global COVID-19 pandemic, which necessitated consideration that the patient might be at risk for infection with the SARS-CoV-2  virus that causes COVID-19. Institutional protocols and algorithms that pertain to the evaluation of patients at risk for COVID-19 are in a state of rapid change based on information released by regulatory bodies including  the CDC and federal and state organizations. These policies and algorithms were followed during the patient's care in the ED.   Final Clinical Impressions(s) / ED Diagnoses   Final diagnoses:  Glasgow coma scale total score 3-8, at arrival to emergency department [facility])  Brain metastases [facility])    ED Discharge Orders    None       Virgel Manifold, MD 10/13/18 2137

## 2018-10-13 NOTE — ED Notes (Signed)
Patient transported to CT scan . 

## 2018-10-13 NOTE — ED Notes (Signed)
ED TO INPATIENT HANDOFF REPORT  ED Nurse Name and Phone #:  [phone]   S Name/Age/Gender Ryan Collins 58 y.o. male Room/Bed: RESUSC/RESUSC  Code Status   Code Status: Full Code  Home/SNF/Other : HOME  {Patient oriented to: INTUBATED Is this baseline? NO  Triage Complete: Triage complete  Chief Complaint Stroke  Triage Note Patient arrived with EMS from home family reported lethargy /decreased LOC this evening , EMS reported respiratory distress enroute with left left side gaze , being bagged at arrival , intubated by EDP at arrival with RT.    Allergies No Known Allergies  Level of Care/Admitting Diagnosis ED Disposition    ED Disposition Condition [facility] Area: [place] [[id number]]  Level of Care: ICU [6]  Covid Evaluation: Person Under Investigation (PUI)  Isolation Risk Level: High Risk/Airborne (Aerosolizing procedure, nebulizer, intubated/ventilation, CPAP/BiPAP)  Diagnosis: Respiratory failure [facility]) [[id number]]  Admitting Physician: Shellia Cleverly [[id number]]  Attending Physician: Shellia Cleverly [phone]  Estimated length of stay: 5 - 7 days  Certification:: I certify this patient will need inpatient services for at least 2 midnights  PT Class (Do Not Modify): Inpatient [101]  PT Acc Code (Do Not Modify): Private [1]       B Medical/Surgery History Past Medical History:  Diagnosis Date  . Cancer [facility])    gastric cancer  . DVT (deep venous thrombosis) ([place]) 2011  . GI bleeding 2001  . History of DVT of lower extremity   . Hypertension   . Melena 05/2017  . Stomach ulcer    Clinically suspected; No EGD confirmation as of 06/03/17   Past Surgical History:  Procedure Laterality Date  . ESOPHAGOGASTRODUODENOSCOPY  06/04/2017  . ESOPHAGOGASTRODUODENOSCOPY N/A 06/04/2017   Procedure: ESOPHAGOGASTRODUODENOSCOPY (EGD);  Surgeon: Carol Ada, MD;  Location: [place];  Service: Endoscopy;  Laterality: N/A;   . GASTRECTOMY N/A 02/25/2018   Procedure: TOTAL GASTRECTOMY WITH J TUBE PLACEMENT;  Surgeon: [name], MD;  Location: [place];  Service: General;  Laterality: N/A;  . IR FLUORO GUIDE PORT INSERTION RIGHT  06/20/2017  . IR US GUIDE VASC ACCESS RIGHT  06/20/2017  . JEJUNOSTOMY N/A 02/25/2018   Procedure: JEJUNOSTOMY TUBE;  Surgeon: [name], MD;  Location: [place];  Service: General;  Laterality: N/A;  . LAPAROSCOPY N/A 02/25/2018   Procedure: LAPAROSCOPY DIAGNOSTIC;  Surgeon: [name], MD;  Location: [place];  Service: General;  Laterality: N/A;  . SPLENECTOMY, TOTAL N/A 02/25/2018   Procedure: SPLENECTOMY;  Surgeon: [name], MD;  Location: [place];  Service: General;  Laterality: N/A;     A IV Location/Drains/Wounds Patient Lines/Drains/Airways Status   Active Line/Drains/Airways    Name:   Placement date:   Placement time:   Site:   Days:   Implanted Port 06/19/17   06/19/17    0929    -   481   Implanted Port Right Chest   -    -    Chest      Peripheral IV 10/13/18 Left Antecubital   10/13/18    -    Antecubital   less than 1   Peripheral IV 10/13/18 Right Forearm   10/13/18    -    Forearm   less than 1   NG/OG Tube Orogastric 18 Fr. Center mouth Aucultation   10/13/18    2004    Center mouth   less than 1   Airway 8 mm  10/13/18    1912     less than 1   Incision (Closed) 02/25/18 Abdomen Other (Comment)   02/25/18    0949     230   Incision - 3 Ports Abdomen 1: Lateral;Lower 2: Mid;Umbilicus 3: Left;Upper;Mid   02/25/18    0910     230          Intake/Output Last 24 hours  Intake/Output Summary (Last 24 hours) at 10/13/2018 2133 Last data filed at 10/13/2018 2007 Gross per 24 hour  Intake 2000 ml  Output -  Net 2000 ml    Labs/Imaging Results for orders placed or performed during the hospital encounter of 10/13/18 (from the past 48 hour(s))  CBC with Differential     Status: Abnormal   Collection Time: 10/13/18  7:21 PM  Result Value Ref Range   WBC 4.7 4.0  - 10.5 K/uL   RBC 4.16 (L) 4.22 - 5.81 MIL/uL   Hemoglobin 12.1 (L) 13.0 - 17.0 g/dL   HCT 37.0 (L) 39.0 - 52.0 %   MCV 88.9 80.0 - 100.0 fL   MCH 29.1 26.0 - 34.0 pg   MCHC 32.7 30.0 - 36.0 g/dL   RDW 22.4 (H) 11.5 - 15.5 %   Platelets 245 150 - 400 K/uL   nRBC 0.6 (H) 0.0 - 0.2 %   Neutrophils Relative % 68 %   Neutro Abs 3.2 1.7 - 7.7 K/uL   Lymphocytes Relative 31 %   Lymphs Abs 1.5 0.7 - 4.0 K/uL   Monocytes Relative 0 %   Monocytes Absolute 0.0 (L) 0.1 - 1.0 K/uL   Eosinophils Relative 0 %   Eosinophils Absolute 0.0 0.0 - 0.5 K/uL   Basophils Relative 1 %   Basophils Absolute 0.0 0.0 - 0.1 K/uL   RBC Morphology Acanthocytes present     Comment: HOWELL/JOLLY BODIES TARGET CELLS FEW TEAR DROP CELLS NOTED    Abs Immature Granulocytes 0.00 0.00 - 0.07 K/uL    Comment: Performed at [facility], [address] [place], [place] [address]  Comprehensive metabolic panel     Status: Abnormal (Preliminary result)   Collection Time: 10/13/18  7:21 PM  Result Value Ref Range   Sodium 137 135 - 145 mmol/L   Potassium 4.4 3.5 - 5.1 mmol/L   Chloride 106 98 - 111 mmol/L   CO2 13 (L) 22 - 32 mmol/L   Glucose, Bld 64 (L) 70 - 99 mg/dL   BUN 19 6 - 20 mg/dL   Creatinine, Ser 1.12 0.61 - 1.24 mg/dL   Calcium 8.7 (L) 8.9 - 10.3 mg/dL   Total Protein 5.1 (L) 6.5 - 8.1 g/dL   Albumin 2.2 (L) 3.5 - 5.0 g/dL   AST 33 15 - 41 U/L   ALT PENDING 0 - 44 U/L   Alkaline Phosphatase 53 38 - 126 U/L   Total Bilirubin 0.8 0.3 - 1.2 mg/dL   GFR calc non Af Amer >60 >60 mL/min   GFR calc Af Amer >60 >60 mL/min   Anion gap 18 (H) 5 - 15    Comment: Performed at [facility] [address] [place], [place] [address]  Protime-INR     Status: Abnormal   Collection Time: 10/13/18  7:21 PM  Result Value Ref Range   Prothrombin Time 15.5 (H) 11.4 - 15.2 seconds   INR 1.3 (H) 0.8 - 1.2    Comment: (NOTE) INR goal varies based on device and disease states.  Performed at [facility], [place] [address] [place], [place] [address]   Troponin I - ONCE - STAT     Status: None   Collection Time: 10/13/18  7:21 PM  Result Value Ref Range   Troponin I <0.03 <0.03 ng/mL    Comment: Performed at [facility], [place] [address] [place], [place] [address]  Magnesium     Status: Abnormal   Collection Time: 10/13/18  7:21 PM  Result Value Ref Range   Magnesium 2.5 (H) 1.7 - 2.4 mg/dL    Comment: Performed at [facility] [address], [place], [place] [address]  Ammonia     Status: Abnormal   Collection Time: 10/13/18  7:30 PM  Result Value Ref Range   Ammonia 80 (H) 9 - 35 umol/L    Comment: Performed at [facility], [place] [address], [place], [place] [address]  I-stat chem 8, ED [facility] and [facility] only)     Status: Abnormal   Collection Time: 10/13/18  7:50 PM  Result Value Ref Range   Sodium 134 (L) 135 - 145 mmol/L   Potassium 4.0 3.5 - 5.1 mmol/L   Chloride 108 98 - 111 mmol/L   BUN 20 6 - 20 mg/dL   Creatinine, Ser 0.90 0.61 - 1.24 mg/dL   Glucose, Bld 62 (L) 70 - 99 mg/dL   Calcium, Ion 1.02 (L) 1.15 - 1.40 mmol/L   TCO2 16 (L) 22 - 32 mmol/L   Hemoglobin 13.6 13.0 - 17.0 g/dL   HCT 40.0 39.0 - 52.0 %  Lactic acid, plasma     Status: Abnormal   Collection Time: 10/13/18  8:05 PM  Result Value Ref Range   Lactic Acid, Venous 7.1 (HH) 0.5 - 1.9 mmol/L    Comment: CRITICAL RESULT CALLED TO, READ BACK BY AND VERIFIED WITH: Sula Fetterly TANGERLINK 10/13/2018 AT 2050 BY H SOEWARDIMAN Performed at [facility], [place] [address] [place],  [address]   Urinalysis, Routine w reflex microscopic     Status: Abnormal   Collection Time: 10/13/18  8:30 PM  Result Value Ref Range   Color, Urine YELLOW YELLOW   APPearance HAZY (A) CLEAR   Specific Gravity, Urine 1.008 1.005 - 1.030   pH 6.0 5.0 - 8.0   Glucose, UA NEGATIVE NEGATIVE mg/dL   Hgb urine dipstick NEGATIVE NEGATIVE   Bilirubin Urine NEGATIVE NEGATIVE   Ketones, ur NEGATIVE NEGATIVE mg/dL    Protein, ur 30 (A) NEGATIVE mg/dL   Nitrite NEGATIVE NEGATIVE   Leukocytes,Ua NEGATIVE NEGATIVE   RBC / HPF 0-5 0 - 5 RBC/hpf   WBC, UA 0-5 0 - 5 WBC/hpf   Bacteria, UA FEW (A) NONE SEEN   Squamous Epithelial / LPF 0-5 0 - 5   Mucus PRESENT    Hyaline Casts, UA PRESENT    Sperm, UA PRESENT     Comment: Performed at [facility], [place] [address] [place], [place] [address]  I-STAT 7, (LYTES, BLD GAS, ICA, H+H)     Status: Abnormal   Collection Time: 10/13/18  8:42 PM  Result Value Ref Range   pH, Arterial 7.405 7.350 - 7.450   pCO2 arterial 30.5 (L) 32.0 - 48.0 mmHg   pO2, Arterial 622.0 (H) 83.0 - 108.0 mmHg   Bicarbonate 19.5 (L) 20.0 - 28.0 mmol/L   TCO2 20 (L) 22 - 32 mmol/L   O2 Saturation 100.0 %   Acid-base deficit 5.0 (H)  0.0 - 2.0 mmol/L   Sodium 137 135 - 145 mmol/L   Potassium 4.2 3.5 - 5.1 mmol/L   Calcium, Ion 1.13 (L) 1.15 - 1.40 mmol/L   HCT 32.0 (L) 39.0 - 52.0 %   Hemoglobin 10.9 (L) 13.0 - 17.0 g/dL   Patient temperature 95.1 F    Sample type ARTERIAL    Dg Chest Portable 1 View  Result Date: 10/13/2018 CLINICAL DATA:  Gastric cancer.  Patient intubated. EXAM: PORTABLE CHEST 1 VIEW COMPARISON:  CT scan Sep 17, 2018 FINDINGS: Pulmonary nodularity was better assessed on the previous CT scan and is new since July 10, 2017, consistent with patient's known pulmonary metastases. An ETT terminates in good position. A right Port-A-Cath terminates in the central SVC. No pneumothorax. Skin folds over the upper right chest. No acute infiltrate. IMPRESSION: 1. Support apparatus as above. 2. Known pulmonary metastases. Electronically Signed   By: Dorise Bullion III M.D   On: 10/13/2018 19:46   Ct Head Code Stroke Wo Contrast`  Result Date: 10/13/2018 CLINICAL DATA:  Code stroke.  Altered mental status EXAM: CT HEAD WITHOUT CONTRAST TECHNIQUE: Contiguous axial images were obtained from the base of the skull through the vertex without intravenous contrast. COMPARISON:   None. FINDINGS: Brain: There are multiple hemorrhagic lesions, the largest of which is located in the left temporal lobe and measures 16 mm. There are at least 5 lesions. No infratentorial lesions are visible. There is no midline shift or other mass effect. Moderate edema surrounds all lesions. No hydrocephalus. Vascular: Atherosclerotic calcification of the internal carotid arteries at the skull base. No abnormal hyperdensity of the major intracranial arteries or dural venous sinuses. Skull: The visualized skull base, calvarium and extracranial soft tissues are normal. Sinuses/Orbits: No fluid levels or advanced mucosal thickening of the visualized paranasal sinuses. No mastoid or middle ear effusion. The orbits are normal. IMPRESSION: 1. At least 5 hyperdense metastases with surrounding edema, greatest in the left temporal lobe. Small volume hemorrhage is associated with most of the lesions. 2. ASPECTS is not applicable in the setting of hemorrhage These results were communicated to Dr. Roland Rack at 8:02 pm on 10/13/2018 by text page via the [facility] messaging system. Electronically Signed   By: Ulyses Jarred M.D.   On: 10/13/2018 20:06    Pending Labs Unresulted Labs (From admission, onward)    Start     Ordered   10/14/18 8469  Basic metabolic panel  Daily,   R     10/13/18 2112   10/14/18 0500  CBC  Daily,   R     10/13/18 2112   10/13/18 2108  HIV antibody (Routine Testing)  Once,   R     10/13/18 2112   10/13/18 2025  SARS Coronavirus 2 (CEPHEID - Performed in [place] hospital lab), Hosp Order  (Asymptomatic Patients Labs)  Once,   R    Question:  Rule Out  Answer:  Yes   10/13/18 2024   10/13/18 1930  Lactic acid, plasma  Now then every 2 hours,   STAT     10/13/18 1929          Vitals/Pain Today's Vitals   10/13/18 2100 10/13/18 2109 10/13/18 2115 10/13/18 2130  BP: 119/67  103/87 106/82  Pulse:      Resp: 18  18 20   Temp:      TempSrc:      SpO2:  100%    Weight:  Height:      PainSc:        Isolation Precautions No active isolations  Medications Medications  dexamethasone (DECADRON) injection 4 mg (4 mg Intravenous Given 10/13/18 2042)  dextrose 5 %-0.9 % sodium chloride infusion ( Intravenous New Bag/Given 10/13/18 2034)  famotidine (PEPCID) IVPB 20 mg premix (has no administration in time range)  fentaNYL 2556mcg in NS 217mL (47mcg/ml) infusion-PREMIX (50 mcg/hr Intravenous New Bag/Given 10/13/18 2131)  fentaNYL (SUBLIMAZE) bolus via infusion 50 mcg (has no administration in time range)  etomidate (AMIDATE) injection 20 mg (20 mg Intravenous Given 10/13/18 1914)  rocuronium (ZEMURON) injection 60 mg (60 mg Intravenous Given 10/13/18 1914)  PHENYLephrine 40 mcg/ml in normal saline Adult IV Push Syringe (100 mcg Intravenous Given 10/13/18 1933)  levETIRAcetam (KEPPRA) 2,000 mg in sodium chloride 0.9 % 100 mL IVPB (0 mg Intravenous Stopped 10/13/18 2121)  fentaNYL (SUBLIMAZE) injection 50 mcg (50 mcg Intravenous Given 10/13/18 2131)    Mobility  Low fall risk   Focused Assessments No family present   R Recommendations: See Admitting Provider Note  Report given to:   Additional Notes:

## 2018-10-14 ENCOUNTER — Inpatient Hospital Stay (HOSPITAL_COMMUNITY): Payer: Medicaid Other

## 2018-10-14 ENCOUNTER — Encounter (HOSPITAL_COMMUNITY): Payer: Self-pay | Admitting: Oncology

## 2018-10-14 DIAGNOSIS — G40901 Epilepsy, unspecified, not intractable, with status epilepticus: Secondary | ICD-10-CM

## 2018-10-14 DIAGNOSIS — Z7189 Other specified counseling: Secondary | ICD-10-CM

## 2018-10-14 DIAGNOSIS — R40243 Glasgow coma scale score 3-8, unspecified time: Secondary | ICD-10-CM

## 2018-10-14 DIAGNOSIS — Z515 Encounter for palliative care: Secondary | ICD-10-CM

## 2018-10-14 DIAGNOSIS — C7931 Secondary malignant neoplasm of brain: Secondary | ICD-10-CM

## 2018-10-14 LAB — CBC
HCT: 35.7 % — ABNORMAL LOW (ref 39.0–52.0)
Hemoglobin: 12.6 g/dL — ABNORMAL LOW (ref 13.0–17.0)
MCH: 29.2 pg (ref 26.0–34.0)
MCHC: 35.3 g/dL (ref 30.0–36.0)
MCV: 82.8 fL (ref 80.0–100.0)
Platelets: 296 10*3/uL (ref 150–400)
RBC: 4.31 MIL/uL (ref 4.22–5.81)
RDW: 21.1 % — ABNORMAL HIGH (ref 11.5–15.5)
WBC: 6.3 10*3/uL (ref 4.0–10.5)
nRBC: 0 % (ref 0.0–0.2)

## 2018-10-14 LAB — BASIC METABOLIC PANEL
Anion gap: 13 (ref 5–15)
BUN: 18 mg/dL (ref 6–20)
CO2: 16 mmol/L — ABNORMAL LOW (ref 22–32)
Calcium: 8.2 mg/dL — ABNORMAL LOW (ref 8.9–10.3)
Chloride: 111 mmol/L (ref 98–111)
Creatinine, Ser: 0.93 mg/dL (ref 0.61–1.24)
GFR calc Af Amer: >60 mL/min (ref >60)
GFR calc non Af Amer: >60 mL/min (ref >60)
Glucose, Bld: 125 mg/dL — ABNORMAL HIGH (ref 70–99)
Potassium: 4.8 mmol/L (ref 3.5–5.1)
Sodium: 140 mmol/L (ref 135–145)

## 2018-10-14 LAB — TRIGLYCERIDES: Triglycerides: 81 mg/dL (ref <150)

## 2018-10-14 LAB — HIV ANTIBODY (ROUTINE TESTING W REFLEX): HIV Screen 4th Generation wRfx: NONREACTIVE

## 2018-10-14 MED ORDER — LACTATED RINGERS IV SOLN
INTRAVENOUS | Status: DC
  Administered 2018-10-14 – 2018-10-16 (×4): via INTRAVENOUS

## 2018-10-14 MED ORDER — CHLORHEXIDINE GLUCONATE CLOTH 2 % EX PADS
6.0000 | MEDICATED_PAD | Freq: Every day | CUTANEOUS | Status: DC
  Administered 2018-10-15 – 2018-10-18 (×4): 6 via TOPICAL

## 2018-10-14 MED ORDER — FENTANYL CITRATE (PF) 100 MCG/2ML IJ SOLN: 50.0000 ug | Freq: Once | INTRAMUSCULAR | Status: DC

## 2018-10-14 MED ORDER — LEVETIRACETAM IN NACL 1000 MG/100ML IV SOLN
1000.0000 mg | Freq: Two times a day (BID) | INTRAVENOUS | Status: DC
  Administered 2018-10-14 – 2018-10-18 (×9): 1000 mg via INTRAVENOUS
  Filled 2018-10-14 (×10): qty 100

## 2018-10-14 NOTE — Consult Note (Signed)
Consultation Note Date: 10/14/2018   Patient Name: Trevell Collins  DOB: 1960/05/23  MRN: [id number]  Age / Sex: 58 y.o., male  PCP: Ryan Collins, [place] Referring Physician: Shellia Cleverly, MD  Reason for Consultation: Establishing goals of care  HPI/Patient Profile: 58 y.o. male  with past medical history of metastatic gastric cancer, DVT, GI bleed, and HTN admitted on 10/13/2018 with respiratory distress requiring intubation. Patient thought to have had seizure causing respiratory distress. He tolerated extubation morning of 6/2. CT revealed multiple hemorrhagic lesions scattered throughout both hemispheres. Also with metastases throughout abdomen and thorax. He was diagnosed with gastric cancer Jan 2019 - has undergone extensive chemo and surgery. PMT consulted for goals of care.   Clinical Assessment and Goals of Care: I have reviewed medical records including EPIC notes, labs and imaging, received report from RN, and then spoke with his mother, Ryan Collins,  to discuss diagnosis prognosis, Ryan Collins, EOL wishes, disposition and options.  Per RN patient still with AMS - unable to participate in conversation at this time. Hopeful mental status clears some and can attempt Ryan Collins conversation with him possibly tomorrow - 6/3.  Reviewed outpatient oncology notes - GOC were discussed with patient during office visit. DNR/DNI was recommended and patient said he would think about it.   During conversation with mother, I introduced Palliative Medicine as specialized medical care for people living with serious illness. It focuses on providing relief from the symptoms and stress of a serious illness. The goal is to improve quality of life for both the patient and the family.  Mother tells me she is familiar with palliative care and that she worked for hospice for 20 years.   She also shares that her daughter passed away with breast cancer 3 years ago - she tells me "I'm not  over that, I can't think about losing my oldest son".  She tells me that patient was living with his brother and had another brother that helped look after him. She shares that his biggest struggle was his appetite - he has lost a significant amount of weight. She tells me he would get dehydrated often. She shares that he had a feeding tube - it was removed in December - she wishes he would've kept the feeding tube. She shares that she believes he was tolerating his chemo treatments well.    We discussed his current illness and what it means in the larger context of his on-going co-morbidities.  Natural disease trajectory and expectations at EOL were discussed. We discussed his widely metastatic cancer, possible seizure, and need for short-term mechanical ventilation. She shares that another doctor recommended hospice. She asks "is there anything we can do for more time?"  I attempted to elicit values and goals of care important to the patient.  She tells me they never spoke about his wishes. She does not know what he would want in this situation. She is hopeful his mental status improves and he is able to speak with [place].   Questions and concerns were addressed. The family was encouraged to call with questions or concerns.  Primary Decision Maker NEXT OF KIN - mother Ryan Collins (hopeful patient's mental status clears in coming days and he is able to make decisions for himself)  SUMMARY OF RECOMMENDATIONS    - maintain full code/full scope - mother overwhelmed with situation and unsure what patient would want moving forward -she is hopeful he is able to speak with [place] in coming days about how to  move forward with his care - PMT will continue to follow  Code Status/Advance Care Planning:  Full code  Prognosis:   Unable to determine  Discharge Planning: To Be Determined      Primary Diagnoses: Present on Admission:  Respiratory failure ([place])   I have reviewed the medical record,  interviewed the patient and family, and examined the patient. The following aspects are pertinent.  Past Medical History:  Diagnosis Date   Cancer [facility])    gastric cancer   DVT (deep venous thrombosis) ([place]) 2011   GI bleeding 2001   History of DVT of lower extremity    Hypertension    Melena 05/2017   Stomach ulcer    Clinically suspected; No EGD confirmation as of 06/03/17   Social History   Socioeconomic History   Marital status: Legally Separated    Spouse name: Not on file   Number of children: Not on file   Years of education: Not on file   Highest education level: Not on file  Occupational History   Not on file  Social Needs   Financial resource strain: Not on file   Food insecurity:    Worry: Not on file    Inability: Not on file   Transportation needs:    Medical: Not on file    Non-medical: Not on file  Tobacco Use   Smoking status: Never Smoker   Smokeless tobacco: Never Used  Substance and Sexual Activity   Alcohol use: Yes    Alcohol/week: 5.0 standard drinks    Types: 5 Cans of beer per week    Comment: 05/2017 i QUIT DRINKING 4 MONTHS AGO "   Drug use: Yes    Frequency: 2.0 times per week    Types: Marijuana    Comment: last use: 02/17/2018   Sexual activity: Not on file  Lifestyle   Physical activity:    Days per week: Not on file    Minutes per session: Not on file   Stress: Not on file  Relationships   Social connections:    Talks on phone: Not on file    Gets together: Not on file    Attends religious service: Not on file    Active member of club or organization: Not on file    Attends meetings of clubs or organizations: Not on file    Relationship status: Not on file  Other Topics Concern   Not on file  Social History Narrative   Not on file   Family History  Problem Relation Age of Onset   Hypertension Mother    Cancer Sister 70       breast   Scheduled Meds:  chlorhexidine gluconate (MEDLINE KIT)  15  mL Mouth Rinse BID   Chlorhexidine Gluconate Cloth  6 each Topical Daily   dexamethasone  4 mg Intravenous Q6H   mouth rinse  15 mL Mouth Rinse 10 times per day   Continuous Infusions:  lactated ringers 75 mL/hr at 10/14/18 1059   PRN Meds:. No Known Allergies  Vital Signs: BP (!) 119/91    Pulse 71    Temp 97.6 F (36.4 C) (Axillary)    Resp 11    Ht _0  (1.778 m)    Wt 49 kg    SpO2 100%    BMI 15.50 kg/m  Pain Scale: CPOT   Pain Score: 0-No pain   SpO2: SpO2: 100 % O2 Device:SpO2: 100 % O2 Flow Rate: .O2 Flow Rate (L/min): 4  L/min  IO: Intake/output summary:   Intake/Output Summary (Last 24 hours) at 10/14/2018 1138 Last data filed at 10/14/2018 0900 Gross per 24 hour  Intake 3619.77 ml  Output 2080 ml  Net 1539.77 ml    LBM: Last BM Date: (pta) Baseline Weight: Weight: 72.6 kg Most recent weight: Weight: 49 kg     Palliative Assessment/Data: PPS 10%   Flowsheet Rows     Most Recent Value  Intake Tab  Referral Department  Critical care  Unit at Time of Referral  ER  Palliative Care Primary Diagnosis  Cancer  Date Notified  10/14/18  Palliative Care Type  Return patient Palliative Care  Reason for referral  Clarify Goals of Care  Date of Admission  10/13/18  # of days IP prior to Palliative referral  1  Clinical Assessment  Psychosocial & Spiritual Assessment  Palliative Care Outcomes     The above conversation was completed via telephone due to the visitor restrictions during the COVID-19 pandemic. Thorough chart review and discussion with necessary members of the care team was completed as part of assessment. All issues were discussed and addressed but no physical exam was performed.  Time Total: 50 minutes Greater than 50%  of this time was spent counseling and coordinating care related to the above assessment and plan.  Juel Burrow, DNP, AGNP-C Palliative Medicine Team [phone] Pager: [phone]

## 2018-10-14 NOTE — Progress Notes (Signed)
Bedside EEG completed, results pending. 

## 2018-10-14 NOTE — Procedures (Signed)
ELECTROENCEPHALOGRAM REPORT   Patient: Ryan Collins       Room #: 4N18C EEG No. ID: [id number] Age: 58 y.o.        Sex: male Referring Physician: Mariane Masters Report Date:  10/14/2018        Interpreting Physician: Alexis Goodell  History: Ryan Collins is an 58 y.o. male presenting with seizure-like activity  Medications:  Decadron, Pepcid, Propofol, Fentanyl, Keppra  Conditions of Recording:  This is a 21 channel routine scalp EEG performed with bipolar and monopolar montages arranged in accordance to the international 10/20 system of electrode placement. One channel was dedicated to EKG recording.  The patient is in the intubated and sedated state.  Description:  Artifact is prominent during the recording often obscuring the background rhythm. When able to be visualized the background is slow and poorly organized.   It consists of a low voltage, polymorphic delta rhythm that is diffusely distributed and continuous.  There is superimposed, diffusely distributed, beta activity, that is fairly well organized, seen throughout the recording as well.   No epileptiform activity is noted.   Hyperventilation and intermittent photic stimulation were not performed  IMPRESSION: This is an abnormal EEG secondary to general background slowing.  This finding may be seen with a diffuse disturbance that is etiologically nonspecific, but may include a metabolic encephalopathy, among other possibilities.  No epileptiform activity was noted.  The superimposed beta activity is likely a medication effect.     Alexis Goodell, MD Neurology [phone] 10/14/2018, 10:29 AM

## 2018-10-14 NOTE — Progress Notes (Signed)
Initial Nutrition Assessment  DOCUMENTATION CODES:   Underweight, Severe malnutrition in context of chronic illness  INTERVENTION:   Support nutrition based on goals of care.    NUTRITION DIAGNOSIS:   Severe Malnutrition related to chronic illness(metastatic cancer) as evidenced by energy intake < or equal to 75% for > or equal to 1 month, percent weight loss.  GOAL:   Patient will meet greater than or equal to 90% of their needs  MONITOR:   Diet advancement, PO intake  REASON FOR ASSESSMENT:   Consult, Ventilator Enteral/tube feeding initiation and management  ASSESSMENT:   Pt with PMH of widely metastatic gastric carcinoma s/p gastrectomy and jejunostomy admitted 6/1 with respiratory distress s/p seizure.    6/2 extubated  Spoke with RN who reports that patient is not following commands at this time so unsure when will be able to advanced diet. As below palliative care consult pending.   Pt had a j-tube placed at time of gastrectomy but it fell out December 2019. Pt has been on PO's since but does not eat/drink much with continued weight loss. Pt meets criteria for malnutrition with documented 19% weight loss x 2 months and documented poor intake per outpatient RD.  Per MD palliative consulted for possible hospice as cancer continues to progress.   Medications reviewed and include: decadron LR @ 75 ml/hr Labs reviewed    NUTRITION - FOCUSED PHYSICAL EXAM:  Deferred   Diet Order:   Diet Order            Diet NPO time specified  Diet effective now              EDUCATION NEEDS:   No education needs have been identified at this time  Skin:  Skin Assessment: Reviewed RN Assessment  Last BM:  unknown  Height:   Ht Readings from Last 1 Encounters:  10/13/18 5\' 10"  (1.778 m)    Weight:   Wt Readings from Last 1 Encounters:  10/14/18 49 kg    Ideal Body Weight:  75.4 kg  BMI:  Body mass index is 15.5 kg/m.  Estimated Nutritional Needs:    Kcal:  1700-2000  Protein:  85-100 grams  Fluid:  > 1.7 L/day  Maylon Peppers RD, LDN, CNSC [phone] Pager [phone] After Hours Pager

## 2018-10-14 NOTE — Progress Notes (Signed)
  Patient transported back from CT to 4N18 without incidence.

## 2018-10-14 NOTE — Procedures (Signed)
Extubation Procedure Note  Patient Details:   Name: Ryan Collins DOB: Jun 17, 1960 MRN: [id number]   Airway Documentation:    Vent end date: 10/14/18 Vent end time: 1040   Evaluation  O2 sats: stable throughout Complications: No apparent complications Patient did tolerate procedure well. Bilateral Breath Sounds: Diminished   Yes   Patient was extubated to a 4L [place]. Cuff leak was heard. No stridor was noted. RN at bedside with RT during extubation.   Renato Gails [facility] 10/14/2018, 10:44 AM

## 2018-10-14 NOTE — Progress Notes (Signed)
HEMATOLOGY-ONCOLOGY PROGRESS NOTE  SUBJECTIVE: The patient is currently followed by oncology for metastatic gastric cancer.  The patient was recently switched to third line therapy secondary to disease progression.  He received his first cycle of Keytruda on 10/03/2018.  The patient was brought to the ER via EMS because he was not feeling well.  In route, he developed worsening respiratory distress requiring intubation upon arrival to the ER.  He was found to have an elevated lactic acid level.  Due to his mental status changes, CT scan of his head was obtained which showed at least 5 hyperdense metastases with surrounding edema, greatest in the left temporal lobe.  There was small volume hemorrhage associated with most of the lesions.  Patient extubated earlier today.  Remains encephalopathic.  When seen today, he is minimally responsive.  He did open his eyes briefly but did not answer any my questions.  He responds to painful stimuli.  I spoke with nursing, they have not noticed any signs of pain or any seizure activity.  Oncology History   Cancer Staging Gastric cancer [facility]) Staging form: Stomach, AJCC 8th Edition - Clinical stage from 06/04/2017: Stage IVB (cT4, cN0, cM1) - Signed by [name], MD on 06/10/2017 - Pathologic stage from 02/25/2018: Stage III (ypT3, pN3a, cM0) - Signed by [name], MD on 04/01/2018       Gastric cancer ([place])   06/03/2017 - 06/06/2017 Hospital Admission    Admitted to the hospital on 06/03/17 with complaints of fatigue and melena. During his stay he underwent an endoscopy that revealed a malignant gastric tumor in the cardia and in the gastric fundus. Biopsy results revealed adenocarcinoma. Pt was discharged on 06/06/17.    06/04/2017 Initial Biopsy    Diagnosis 06/04/17 Stomach, biopsy, Fundus - ADENOCARCINOMA - SEE COMMENT Microscopic Comment The stomach biopsy is of an adenocarcinoma arising in a background of intestinal metaplasia. A Warthin-Starry stain  is performed to determine the possibility of the presence of Helicobacter pylori. The Warthin-Starry stain is negative for organisms morphologically consistent with Helicobacter pylori.    06/04/2017 Procedure    Esophagogastroduodenoscopy 06/04/17 by Dr. Benson Norway Impression: Normal esophagus. Malignant gastric tumor in the cardia and in the  gastric fundus. Biopsied. Normal examined duodenum.    06/04/2017 Miscellaneous    HER2 (-) EBV (-) MSI-S PD-L1 CPS 5%    06/05/2017 Imaging    CT CAP W Contrast 06/05/17 IMPRESSION: 9.1 cm fungating mass in the gastric cardia/posterior gastric fundus, corresponding to the patient's newly diagnosed gastric Cancer. Associated extra gastric extension with peritoneal disease in the left upper abdomen, including a dominant 4.2 cm peritoneal implant. Small volume pelvic ascites. 1.8 cm hypoenhancing lesion along the inferior spleen, indeterminate.    06/10/2017 Initial Diagnosis    Gastric cancer ([address])    06/26/2017 - 01/22/2018 Chemotherapy    First line chemo FOLFOX every 2 weeks     08/29/2017 Imaging    IMPRESSION: 1. Interval decrease in size of fungating ulcerative mass arising from the posterior gastric fundus. 2. Left peritoneal implant, compatible with peritoneal carcinomatosis, has decreased in size from the previous exam. 3. Right lobe of liver lesion present on previous exam is less conspicuous on today's study. A new lesion is identified within the left lobe of liver, suspicious for metastasis. Additional focus of low attenuation within segment 5 adjacent to the gallbladder fossa may represent focal fatty deposition. 4. Similar appearance of mildly complex cyst containing mural calcification and possible internal septation arising from the  inferior pole of right kidney. More definitive characterization of this lesion with renal protocol MRI may be helpful. 5.  Mild prostate gland enlargement. 6.  Aortic Atherosclerosis (ICD10-I70.0).    11/25/2017  Imaging    11/25/2017 CT AP W Contrast IMPRESSION: 1. Mixed appearance, with the gastric fundal mass thicker than it was on 08/29/2017 (although still improved compared to 06/05/2017), but with reduced local conglomerate adenopathy along the splenic hilum compared to 08/29/2017. 2. At this time I do not see any definite hepatic metastatic lesions. Several lesions of suspicion in the left hepatic lobe and inferiorly in the right hepatic lobe are not readily visible today. There is a small focus of hypodensity adjacent to the gallbladder fossa in segment 7 which is technically nonspecific but which could reflect focal fatty infiltration. 3. Increase in nodular contour of scarring in the left lower lobe. This is probably incidental but I cannot completely exclude the possibility of a localized metastatic lesion. Surveillance of the left lower lobe suggested. 4. Bosniak category 13F cyst of the right kidney lower pole with internal septation and calcification. Surveillance of this lesion is suggested; I suspect that the patient's cancer surveillance will supersede the typical recommended surveillance schedule for complex cyst. 5. Other imaging findings of potential clinical significance: Aortic Atherosclerosis (ICD10-I70.0). Degenerative glenohumeral arthropathy bilaterally. Mild impingement at L4-5 and L5-S1.    12/23/2017 Imaging    12/23/2017 MRI Abdomen IMPRESSION: Ill-defined soft tissue mass in the gastric fundus shows no significant change, consistent known gastric adenocarcinoma.  No definite liver metastases identified. Hemosiderosis noted, with focal area of sparing seen in the right hepatic lobe which accounts for the lesions seen on recent CT.  Stable 1.9 cm probably benign Bosniak category 2 F cystic lesion in right kidney. Recommend continued attention on follow-up imaging.    02/12/2018 Imaging    02/12/2018 CT CAP IMPRESSION: 1. Roughly similar appearance of the mass  involving the gastric cardia/fundus. 2. Hepatic steatosis with some areas of fatty sparing as shown on MRI. No definite focal metastatic lesion to the liver is identified. 3. Nonspecific 0.8 cm lymph node between the tail the pancreas in the splenic hilum. There is also a mildly enlarged but stable right hilar lymph node at 1.0 cm in diameter, significance uncertain. 4. Overall stable appearance of the 1.9 cm Bosniak category 13F cyst of the right kidney lower pole. We have now documented 9 months of relative stability. Surveillance at or before 6 months time is recommended. 5. Other imaging findings of potential clinical significance: Aortic Atherosclerosis (ICD10-I70.0). Small anterior pericardial effusion. Stable scarring in the left lower lobe. Medial hypoenhancement of the upper spleen is probably from early phase of contrast, less likely to be splenic infarct given the relatively normal appearance on delayed images. Impingement at L4-5 and L5-S1.    02/25/2018 Surgery    On 02/25/2018, he had a diagnostic laparoscopy, liver biopsy, total gastrectomy with esophagojejunostomy en bloc with distal pancreatectomy/splenectomy, feeding jejunostomy tube with Dr. Barry Dienes.     02/25/2018 Pathology Results    02/25/2018 Surgical Pathology Diagnosis 1. Soft tissue, biopsy, Diaphragmatic Nodule - NEGATIVE FOR CARCINOMA. 2. Liver, biopsy - LIVER PARENCHYMA, NEGATIVE FOR CARCINOMA. 3. Liver, biopsy, Right - LIVER PARENCHYMA WITH CAPSULAR FIBROSIS. - NEGATIVE FOR CARCINOMA. 4. Soft tissue, biopsy, Falciform Nodule - LIVER PARENCHYMA WITH FIBROTIC BANDS. SEE NOTE. - NEGATIVE FOR CARCINOMA. 5. Omentum, resection for tumor - BENIGN PERITONEUM-LINED ADIPOSE TISSUE. - NEGATIVE FOR CARCINOMA. 6. Stomach, resection for tumor, distal pancreas and spleen -  INVASIVE MODERATELY TO POORLY DIFFERENTIATED GASTRIC CARCINOMA, 7 CM. - CARCINOMA INVADES INTO PERIGASTRIC SOFT TISSUE AND IS ADHERENT TO  PANCREAS AND SPLEEN BUT DOES NOT INVADE INTO PANCREATIC OR SPLENIC PARENCHYMA. - NO EVIDENCE OF LYMPHOVASCULAR OR PERINEURAL INVASION. - MINIMAL THERAPEUTIC RESPONSE (TUMOR REGRESSION SCORE 3). - METASTATIC CARCINOMA TO EIGHT OF THIRTY-SEVEN LYMPH NODES (8/37). - ALL RESECTION MARGINS ARE NEGATIVE FOR CARCINOMA. - BENIGN UNREMARKABLE PANCREAS. - SEE ONCOLOGY TABLE.    02/25/2018 Cancer Staging    Staging form: Stomach, AJCC 8th Edition - Pathologic stage from 02/25/2018: Stage III (ypT3, pN3a, cM0) - Signed by [name], MD on 04/01/2018    06/05/2018 Imaging    CT CAP W COntrast 06/05/18  IMPRESSION: 1. Interval gastrectomy with esophagojejunostomy in the lower chest. Splenectomy and partial pancreatectomy. 2. At least 35 new pulmonary nodules are present, measuring up to 1.5 cm in diameter, high suspicion for metastatic disease. 3. Portacaval and porta hepatis adenopathy, suspicious for malignancy. 4. Although I do not see a definite liver tumor, sensitivity for small lesions is mildly adversely affected due to the late arterial phase of contrast administration. 5. New thoracic spine compression fractures at T6, T7, T2, T3, and T4 as detailed above. New lower sternal manubrial fracture. 6. Right inguinal hernia containing a loop of small bowel, no overt obstruction or strangulation currently evident. 7. Diffuse mild subcutaneous and mesenteric edema, cause uncertain. 8. Other imaging findings of potential clinical significance: Aortic Atherosclerosis (ICD10-I70.0). Lower lumbar spondylosis and degenerative disc disease.    06/16/2018 - 09/10/2018 Chemotherapy    On second line chemo weekly taxol andCyramzaq2weeks starting 06/16/2018. Added weekly Carboplatin with cycle 3. D/c after cycle 5 due to disease progression.     09/17/2018 Imaging    CT CAP IMPRESSION: 1. A 7 mm pulmonary nodule of the right lower lobe as slightly increased in size, measuring 7 mm, previously 3 mm (series  6, image 82). Multiple additional small bilateral pulmonary nodules are minimally increased in size.  2. Slight interval enlargement of a hypodense portacaval lymph node, measuring 3.1 x 2.2 cm, previously 2.6 x 2.0 cm when measured similarly (series 2, image 69).  3.  Findings are consistent with worsening metastatic disease.  4. Unchanged postoperative findings status post gastrectomy, splenectomy, and distal pancreatectomy.    10/01/2018 -  Chemotherapy    Keytruda every 3 weeks starting this week      REVIEW OF SYSTEMS:   A comprehensive review of systems could not be obtained secondary to patient's condition.  I have reviewed the past medical history, past surgical history, social history and family history with the patient and they are unchanged from previous note.   PHYSICAL EXAMINATION: ECOG PERFORMANCE STATUS: 3 - Symptomatic, >50% confined to bed  Vitals:   10/14/18 0953 10/14/18 1040  BP: (!) 119/91   Pulse: 71   Resp: 11   Temp:    SpO2: 100% 100%   Filed Weights   10/13/18 1907 10/13/18 2205 10/14/18 0500  Weight: 160 lb (72.6 kg) 108 lb 0.4 oz (49 kg) 108 lb 0.4 oz (49 kg)    Intake/Output from previous day: 06/01 0701 - 06/02 0700 In: 3395.2 [I.V.:3229.2; IV Piggyback:166] Out: 2050 [Urine:2050]  GENERAL: Looks comfortable.  Opens eyes briefly.  Not answer questions. HEAD: Temporal muscle wasting OROPHARYNX: No thrush NECK: supple, thyroid normal size, non-tender, without nodularity LYMPH:  no palpable lymphadenopathy in the cervical, axillary or inguinal LUNGS: clear to auscultation  HEART: regular rate & rhythm and no  murmurs and no lower extremity edema ABDOMEN:abdomen soft, non-tender and normal bowel sounds Musculoskeletal:no cyanosis of digits and no clubbing  NEURO: Awakens with stimulus.  Able to move all extremities.  LABORATORY DATA:  I have reviewed the data as listed CMP Latest Ref Rng & Units 10/14/2018 10/13/2018 10/13/2018  Glucose 70 -  99 mg/dL 125(H) - 62(L)  BUN 6 - 20 mg/dL 18 - 20  Creatinine 0.61 - 1.24 mg/dL 0.93 - 0.90  Sodium 135 - 145 mmol/L 140 137 134(L)  Potassium 3.5 - 5.1 mmol/L 4.8 4.2 4.0  Chloride 98 - 111 mmol/L 111 - 108  CO2 22 - 32 mmol/L 16(L) - -  Calcium 8.9 - 10.3 mg/dL 8.2(L) - -  Total Protein 6.5 - 8.1 g/dL - - -  Total Bilirubin 0.3 - 1.2 mg/dL - - -  Alkaline Phos 38 - 126 U/L - - -  AST 15 - 41 U/L - - -  ALT 0 - 44 U/L - - -    Lab Results  Component Value Date   WBC 6.3 10/14/2018   HGB 12.6 (L) 10/14/2018   HCT 35.7 (L) 10/14/2018   MCV 82.8 10/14/2018   PLT 296 10/14/2018   NEUTROABS 3.2 10/13/2018    Ct Head Wo Contrast  Result Date: 10/14/2018 CLINICAL DATA:  Stroke follow-up EXAM: CT HEAD WITHOUT CONTRAST TECHNIQUE: Contiguous axial images were obtained from the base of the skull through the vertex without intravenous contrast. COMPARISON:  None. FINDINGS: Brain: Unchanged size and distribution of multiple hemorrhagic lesions scattered throughout both cerebral hemispheres, the largest of which is in the left temporal lobe. The surrounding vasogenic edema patterns are unchanged. Size and configuration of the CSF spaces are also unchanged. Thickened appearance of the pituitary infundibulum. Vascular: No abnormal hyperdensity of the major intracranial arteries or dural venous sinuses. No intracranial atherosclerosis. Skull: The visualized skull base, calvarium and extracranial soft tissues are normal. Sinuses/Orbits: No fluid levels or advanced mucosal thickening of the visualized paranasal sinuses. No mastoid or middle ear effusion. The orbits are normal. IMPRESSION: 1. Unchanged size and configuration of multiple hemorrhagic lesions scattered throughout both hemispheres (approximately 5). Unchanged edema pattern. MRI of the brain with and without contrast is recommended. 2. Thickened appearance of the pituitary infundibulum may also indicate metastatic disease. Attention recommended on  MRI. Electronically Signed   By: Ulyses Jarred M.D.   On: 10/14/2018 03:51   Ct Chest W Contrast  Result Date: 09/17/2018 CLINICAL DATA:  Gastric cancer, pulmonary metastases, weight loss EXAM: CT CHEST, ABDOMEN, AND PELVIS WITH CONTRAST TECHNIQUE: Multidetector CT imaging of the chest, abdomen and pelvis was performed following the standard protocol during bolus administration of intravenous contrast. CONTRAST:  113m OMNIPAQUE IOHEXOL 300 MG/ML SOLN, additional oral enteric contrast COMPARISON:  06/05/2018 FINDINGS: CT CHEST FINDINGS Cardiovascular: No significant vascular findings. Normal heart size. No pericardial effusion. Right chest port catheter. Mediastinum/Nodes: No enlarged mediastinal, hilar, or axillary lymph nodes. Thyroid gland, trachea, and esophagus demonstrate no significant findings. Lungs/Pleura: A 7 mm pulmonary nodule of the right lower lobe as slightly increased in size, measuring 7 mm, previously 3 mm (series 6, image 82). Multiple additional small bilateral pulmonary nodules are minimally increased in size. No pleural effusion or pneumothorax. Musculoskeletal: No chest wall mass. CT ABDOMEN PELVIS FINDINGS Hepatobiliary: No focal liver abnormality is seen. No gallstones, gallbladder wall thickening, or biliary dilatation. Pancreas: Status post distal pancreatectomy. No pancreatic ductal dilatation or surrounding inflammatory changes. Spleen: Status post splenectomy. Adrenals/Urinary Tract:  Adrenal glands are unremarkable. Kidneys are normal, without renal calculi, focal lesion, or hydronephrosis. Bladder is unremarkable. Stomach/Bowel: Status post gastrectomy. Appendix appears normal. No evidence of bowel wall thickening, distention, or inflammatory changes. Vascular/Lymphatic: No significant vascular findings are present. Slight interval enlargement of a hypodense portacaval lymph node, measuring 3.1 x 2.2 cm, previously 2.6 x 2.0 cm when measured similarly (series 2, image 69).  Reproductive: No mass or other abnormality. Other: No abdominal wall hernia or abnormality. No abdominopelvic ascites. Musculoskeletal: Redemonstrated wedge deformities of T6 and T7. IMPRESSION: 1. A 7 mm pulmonary nodule of the right lower lobe as slightly increased in size, measuring 7 mm, previously 3 mm (series 6, image 82). Multiple additional small bilateral pulmonary nodules are minimally increased in size. 2. Slight interval enlargement of a hypodense portacaval lymph node, measuring 3.1 x 2.2 cm, previously 2.6 x 2.0 cm when measured similarly (series 2, image 69). 3.  Findings are consistent with worsening metastatic disease. 4. Unchanged postoperative findings status post gastrectomy, splenectomy, and distal pancreatectomy. Electronically Signed   By: Eddie Candle M.D.   On: 09/17/2018 15:38   Ct Abdomen Pelvis W Contrast  Result Date: 09/17/2018 CLINICAL DATA:  Gastric cancer, pulmonary metastases, weight loss EXAM: CT CHEST, ABDOMEN, AND PELVIS WITH CONTRAST TECHNIQUE: Multidetector CT imaging of the chest, abdomen and pelvis was performed following the standard protocol during bolus administration of intravenous contrast. CONTRAST:  138m OMNIPAQUE IOHEXOL 300 MG/ML SOLN, additional oral enteric contrast COMPARISON:  06/05/2018 FINDINGS: CT CHEST FINDINGS Cardiovascular: No significant vascular findings. Normal heart size. No pericardial effusion. Right chest port catheter. Mediastinum/Nodes: No enlarged mediastinal, hilar, or axillary lymph nodes. Thyroid gland, trachea, and esophagus demonstrate no significant findings. Lungs/Pleura: A 7 mm pulmonary nodule of the right lower lobe as slightly increased in size, measuring 7 mm, previously 3 mm (series 6, image 82). Multiple additional small bilateral pulmonary nodules are minimally increased in size. No pleural effusion or pneumothorax. Musculoskeletal: No chest wall mass. CT ABDOMEN PELVIS FINDINGS Hepatobiliary: No focal liver abnormality is  seen. No gallstones, gallbladder wall thickening, or biliary dilatation. Pancreas: Status post distal pancreatectomy. No pancreatic ductal dilatation or surrounding inflammatory changes. Spleen: Status post splenectomy. Adrenals/Urinary Tract: Adrenal glands are unremarkable. Kidneys are normal, without renal calculi, focal lesion, or hydronephrosis. Bladder is unremarkable. Stomach/Bowel: Status post gastrectomy. Appendix appears normal. No evidence of bowel wall thickening, distention, or inflammatory changes. Vascular/Lymphatic: No significant vascular findings are present. Slight interval enlargement of a hypodense portacaval lymph node, measuring 3.1 x 2.2 cm, previously 2.6 x 2.0 cm when measured similarly (series 2, image 69). Reproductive: No mass or other abnormality. Other: No abdominal wall hernia or abnormality. No abdominopelvic ascites. Musculoskeletal: Redemonstrated wedge deformities of T6 and T7. IMPRESSION: 1. A 7 mm pulmonary nodule of the right lower lobe as slightly increased in size, measuring 7 mm, previously 3 mm (series 6, image 82). Multiple additional small bilateral pulmonary nodules are minimally increased in size. 2. Slight interval enlargement of a hypodense portacaval lymph node, measuring 3.1 x 2.2 cm, previously 2.6 x 2.0 cm when measured similarly (series 2, image 69). 3.  Findings are consistent with worsening metastatic disease. 4. Unchanged postoperative findings status post gastrectomy, splenectomy, and distal pancreatectomy. Electronically Signed   By: AEddie CandleM.D.   On: 09/17/2018 15:38   Dg Chest Port 1 View  Result Date: 10/14/2018 CLINICAL DATA:  History of stomach carcinoma, check tube placement EXAM: PORTABLE CHEST 1 VIEW COMPARISON:  10/13/2018, CT from  09/17/2010 FINDINGS: Cardiac shadows within normal limits. Endotracheal tube, gastric catheter right chest wall port are noted. Gastric catheter is noted coiled upon itself within the distal esophagus. Multiple  nodular changes are noted in both lungs consistent with metastatic disease. No acute bony abnormality is noted. IMPRESSION: Stable pulmonary metastasis. Gastric catheter coiled upon itself within the distal esophagus. Electronically Signed   By: Inez Catalina M.D.   On: 10/14/2018 07:37   Dg Chest Portable 1 View  Result Date: 10/13/2018 CLINICAL DATA:  Gastric cancer.  Patient intubated. EXAM: PORTABLE CHEST 1 VIEW COMPARISON:  CT scan Sep 17, 2018 FINDINGS: Pulmonary nodularity was better assessed on the previous CT scan and is new since July 10, 2017, consistent with patient's known pulmonary metastases. An ETT terminates in good position. A right Port-A-Cath terminates in the central SVC. No pneumothorax. Skin folds over the upper right chest. No acute infiltrate. IMPRESSION: 1. Support apparatus as above. 2. Known pulmonary metastases. Electronically Signed   By: Dorise Bullion III M.D   On: 10/13/2018 19:46   Dg Abd Portable 1v  Result Date: 10/14/2018 CLINICAL DATA:  Check gastric catheter placement EXAM: PORTABLE ABDOMEN - 1 VIEW COMPARISON:  None. FINDINGS: Gastric catheter is noted coiled within the distal esophagus. This should be withdrawn and readvanced into the stomach. Scattered large and small bowel gas is noted. IMPRESSION: Gastric catheter coiled within the distal esophagus. Electronically Signed   By: Inez Catalina M.D.   On: 10/14/2018 07:35   Ct Head Code Stroke Wo Contrast`  Result Date: 10/13/2018 CLINICAL DATA:  Code stroke.  Altered mental status EXAM: CT HEAD WITHOUT CONTRAST TECHNIQUE: Contiguous axial images were obtained from the base of the skull through the vertex without intravenous contrast. COMPARISON:  None. FINDINGS: Brain: There are multiple hemorrhagic lesions, the largest of which is located in the left temporal lobe and measures 16 mm. There are at least 5 lesions. No infratentorial lesions are visible. There is no midline shift or other mass effect. Moderate edema  surrounds all lesions. No hydrocephalus. Vascular: Atherosclerotic calcification of the internal carotid arteries at the skull base. No abnormal hyperdensity of the major intracranial arteries or dural venous sinuses. Skull: The visualized skull base, calvarium and extracranial soft tissues are normal. Sinuses/Orbits: No fluid levels or advanced mucosal thickening of the visualized paranasal sinuses. No mastoid or middle ear effusion. The orbits are normal. IMPRESSION: 1. At least 5 hyperdense metastases with surrounding edema, greatest in the left temporal lobe. Small volume hemorrhage is associated with most of the lesions. 2. ASPECTS is not applicable in the setting of hemorrhage These results were communicated to Dr. Roland Rack at 8:02 pm on 10/13/2018 by text page via the [facility] messaging system. Electronically Signed   By: Ulyses Jarred M.D.   On: 10/13/2018 20:06    ASSESSMENT AND PLAN: 1.  Metastatic gastric cancer 2.  Brain metastases 3.  Possible seizure with postictal state 4.  Acute metabolic encephalopathy 5.  Acute hypoxic respiratory failure 6.  Anemia  -Appreciate assistance from PCCM and palliative care -The patient has had disease progression and recently started a third line therapy with Keytruda.  The anticipated response rate from [facility] is probably low due to low PD-L1 expression of his tumor.  He has now developed new brain metastases. -Dr. Burr Medico plans to see the patient in the morning to further discuss with the patient and his family consideration of brain radiation versus referral to palliative care/hospice. -Agree with continuing dexamethasone for  now pending further discussion of goals of care. -Continue Keppra per neurology -Hemoglobin is stable at 12.6.  Continue to monitor and consider transfusion if his hemoglobin drops to less than 7.0 or active bleeding.  We will continue to follow this patient with you.   LOS: 1 day   Mikey Bussing, DNP, AGPCNP-BC,  AOCNP 10/14/18

## 2018-10-14 NOTE — Progress Notes (Signed)
Reason for consult:   Subjective: Remains intubated. Patient withdrawing on all 4 extremities to noxious stimulus.  No further seizure activity noted.   ERX:VQMGQQ to obtain due to poor mental status  Examination  Vital signs in last 24 hours: Temp:  [93.7 F (34.3 C)-97.7 F (36.5 C)] 97.6 F (36.4 C) (06/02 0800) Pulse Rate:  [78-115] 87 (06/02 0820) Resp:  [6-31] 20 (06/02 0820) BP: (63-146)/(52-106) 93/79 (06/02 0820) SpO2:  [95 %-100 %] 99 % (06/02 0820) FiO2 (%):  [40 %] 40 % (06/02 0820) Weight:  [49 kg-72.6 kg] 49 kg (06/02 0500)  General: lying in bed, cachectic  CVS: pulse-normal rate and rhythm RS: breathing comfortably Extremities: normal   Neuro: (Performed with patient being intubated) Mental Status: Patient grimaces to deep sternal rub.  Not following any commands Cranial Nerves: II: Pupils are 2-3 mm bilaterally, sluggish III,IV,VI: doll's response present IX,X: gag reflex present Motor: Withdraws in all 4 extremities to noxious stimulus Sensory: Unable to test Plantars: flexor bilaterally Cerebellar: Unable to perform  Basic Metabolic Panel: Recent Labs  Lab 10/13/18 1921 10/13/18 1950 10/13/18 2042 10/14/18 0219  NA 137 134* 137 140  K 4.4 4.0 4.2 4.8  CL 106 108  --  111  CO2 13*  --   --  16*  GLUCOSE 64* 62*  --  125*  BUN 19 20  --  18  CREATININE 1.12 0.90  --  0.93  CALCIUM 8.7*  --   --  8.2*  MG 2.5*  --   --   --     CBC: Recent Labs  Lab 10/13/18 1921 10/13/18 1950 10/13/18 2042 10/14/18 0219  WBC 4.7  --   --  6.3  NEUTROABS 3.2  --   --   --   HGB 12.1* 13.6 10.9* 12.6*  HCT 37.0* 40.0 32.0* 35.7*  MCV 88.9  --   --  82.8  PLT 245  --   --  296     Coagulation Studies: Recent Labs    10/13/18 1921  LABPROT 15.5*  INR 1.3*    Imaging Reviewed:     ASSESSMENT AND PLAN  Status epilepticus Metastatic gastric cancer with brain mets  - R EEG today -Continue Keppra 1 g twice daily -Extubate if patient  is able to tolerate -Continue  Dexamethasone -Oncology consult and palliative care consult  Karena Addison  [facility] Pager Number [phone] For questions after 7pm please refer to AMION to reach the Neurologist on call

## 2018-10-14 NOTE — Progress Notes (Signed)
Inpatient Diabetes Program Recommendations  AACE/ADA: New Consensus Statement on Inpatient Glycemic Control (2015)  Target Ranges:  Prepandial:   less than 140 mg/dL      Peak postprandial:   less than 180 mg/dL (1-2 hours)      Critically ill patients:  140 - 180 mg/dL   Lab Results  Component Value Date   GLUCAP 89 06/04/2017    Review of Glycemic Control Results for ZAIN, LANKFORD (MRN [id number]) as of 10/14/2018 11:07  Ref. Range 10/13/2018 19:50 10/13/2018 20:05 10/13/2018 20:42 10/13/2018 22:28 10/14/2018 02:19  Glucose Latest Ref Range: 70 - 99 mg/dL 62 (L)    125 (H)    Outpatient Diabetes medications: none Current orders for Inpatient glycemic control: none Decadron 4 mg Q6H  Inpatient Diabetes Program Recommendations:    Noted patient hypoglycemic on admission. May want to consider adding CBGs Q4H.  Thanks, Bronson Curb, MSN, RNC-OB Diabetes Coordinator [phone] (8a-5p)

## 2018-10-14 NOTE — Progress Notes (Signed)
50 ml IV Fentanyl wasted in Stericycle container, witnessed by Trish Mage, RN

## 2018-10-14 NOTE — Progress Notes (Addendum)
NAME:  Ryan Collins, MRN:  [id number], DOB:  [birth year], LOS: 1 ADMISSION DATE:  10/13/2018, CONSULTATION DATE: 10/13/2018 REFERRING MD:  ER, CHIEF COMPLAINT:  Respiratory failure with lethargy for several days  Brief History   58 year old male with widely metastatic gastric carcinoma developed respiratory distress in route to the hospital requiring intubation  Past Medical History  Cancer (gastric, widely metastatic), DVT, HTN, [place] [facility] Events   6/2 Intubated admitted to the hospital October 13, 2018, question of respiratory distress s/p possible seizure , initial lactic acid 7.1, this cleared to 3.8.  He was started on anticonvulsants,IV Decadron, and supportive care.  EEG being obtained for seizure.  More awake on a.m. rounds  Consults:  Neurology  Procedures:  Intubated  Significant Diagnostic Tests:   CT brain 6/2: No hemorrhagic lesions scattered throughout both hemispheres cerebral edema unchanged ultimately the same from prior imaging   EEG 6/2 >>> Micro Data:  NA  Antimicrobials:  NA  Interim history/subjective:  Waking up, now on pressure support.  No obvious seizure activity  Objective   Blood pressure (Abnormal) 121/94, pulse 80, temperature 97.6 F (36.4 C), temperature source Axillary, resp. rate 18, height 5\' 10"  (1.778 m), weight 49 kg, SpO2 99 %.    Vent Mode: PRVC FiO2 (%):  [40 %] 40 % Set Rate:  [20 bmp] 20 bmp Vt Set:  [580 mL] 580 mL PEEP:  [5 cmH20] 5 cmH20 Plateau Pressure:  [15 cmH20-17 cmH20] 15 cmH20   Intake/Output Summary (Last 24 hours) at 10/14/2018 0950 Last data filed at 10/14/2018 0900 Gross per 24 hour  Intake 3619.77 ml  Output 2080 ml  Net 1539.77 ml   Filed Weights   10/13/18 1907 10/13/18 2205 10/14/18 0500  Weight: 72.6 kg 49 kg 49 kg    Examination: General: This is a cachectic 58 year old male patient he is currently intubated on ventilator HEENT temporal wasting orally intubated mucous membranes moist  Pulmonary: Clear to auscultation cycling on pressure support ventilation initial volumes in the 200 range on pressure support of 5 Cardiac: Regular rate and rhythm Abdomen: Soft nontender Extremities: Warm and dry Neuro awakens with stimulus, moves all extremities.  Resolved Hospital Problem list   Lactic acidosis present probably secondary to seizure  Assessment & Plan:  Acute metabolic encephalopathy.  Working diagnosis of possible seizure with postictal state in the setting of known cerebral metastasis  -No clear seizure at this point  -EEG obtained  -Sedation limited  Plan  Continuing AEDs as directed by neurology  Continuing IV Decadron  Serial neuro checks  Follow-up EEG   Acute hypoxic respiratory failure in the setting of ineffective airway protection -Chest x-ray personally reviewed:Stable pulmonary metastasis however no acute findings endotracheal tube in satisfactory position -Mental status improving, currently on pressure support Plan Discontinue all sedation Initiate spontaneous breathing trial with goal for extubation Pulse oximetry VAP bundle Oxygen as indicated  Widely metastatic gastric carcinoma with metastasis to brain throughout the abdomen and also thorax, now with progressive failure to thrive -Currently on Keytruda -Already undergone extensive chemotherapy and surgery Plan Continuing supportive care for now Will message his oncologist Palliative care consulted I think we need to establish goals of care he would benefit from transition to hospice at this point  Improving gap metabolic acidosis with resolving lactic acidosis following seizure Plan Continuing IV hydration, changing to lactated Ringer's as appears to be element of non-gap component and chloride climbing A.m. chemistry  Anemia of chronic disease without evidence  of hemorrhage Plan Trend CBC  Protein calorie malnutrition in setting of malignancy Plan Nutritional consult  postextubation   Best practice:  Diet: N.p.o. Pain/Anxiety/Delirium protocol (if indicated): Fentanyl if needed VAP protocol (if indicated): S DVT prophylaxis: SCDs GI prophylaxis: Pepcid Glucose control: Monitor Mobility: Bedrest Code Status: Full Family Communication: Pending Disposition:  Remains critically ill.  Mental status slowly improving.  Working diagnosis at this point is likely respiratory failure following seizure.  Mental status is improved.  I think you be extubated later today.  Erick Colace ACNP-BC [place] Pager # [phone] OR # [phone] if no answer    Patient seen and examined with Nurse Practitioner.  Agree with their assessment and plan as written.  Patient seen in f/u for acute encephalopathy and respiratory failure in setting of metastatic progressive gastric cancer with cerebral metastases.    On exam, patient following commands intermittently, pulling excellent tidal volumes.  Lungs are clear, heart sounds regular, ext are warm.  Labs this AM benign.  CXR with hyperinflation and scattered nodules.  Working diagnosis of seizure and postictal state makes sense.  Patient's cancer has been progressing despite aggressive therapy.  Would continue steroids, AEDs, and extubate.  Goals of care discussion is warranted hopefully with oncology and palliative both helping.

## 2018-10-15 DIAGNOSIS — C7931 Secondary malignant neoplasm of brain: Secondary | ICD-10-CM

## 2018-10-15 DIAGNOSIS — R4182 Altered mental status, unspecified: Secondary | ICD-10-CM

## 2018-10-15 DIAGNOSIS — C169 Malignant neoplasm of stomach, unspecified: Secondary | ICD-10-CM

## 2018-10-15 DIAGNOSIS — D630 Anemia in neoplastic disease: Secondary | ICD-10-CM

## 2018-10-15 DIAGNOSIS — Z681 Body mass index (BMI) 19 or less, adult: Secondary | ICD-10-CM

## 2018-10-15 DIAGNOSIS — R569 Unspecified convulsions: Secondary | ICD-10-CM

## 2018-10-15 DIAGNOSIS — E43 Unspecified severe protein-calorie malnutrition: Secondary | ICD-10-CM

## 2018-10-15 LAB — CBC
HCT: 36.3 % — ABNORMAL LOW (ref 39.0–52.0)
Hemoglobin: 12.7 g/dL — ABNORMAL LOW (ref 13.0–17.0)
MCH: 29.3 pg (ref 26.0–34.0)
MCHC: 35 g/dL (ref 30.0–36.0)
MCV: 83.6 fL (ref 80.0–100.0)
Platelets: 244 10*3/uL (ref 150–400)
RBC: 4.34 MIL/uL (ref 4.22–5.81)
RDW: 21.8 % — ABNORMAL HIGH (ref 11.5–15.5)
WBC: 11.3 10*3/uL — ABNORMAL HIGH (ref 4.0–10.5)
nRBC: 0 % (ref 0.0–0.2)

## 2018-10-15 LAB — BASIC METABOLIC PANEL
Anion gap: 8 (ref 5–15)
BUN: 12 mg/dL (ref 6–20)
CO2: 19 mmol/L — ABNORMAL LOW (ref 22–32)
Calcium: 8.4 mg/dL — ABNORMAL LOW (ref 8.9–10.3)
Chloride: 121 mmol/L — ABNORMAL HIGH (ref 98–111)
Creatinine, Ser: 0.7 mg/dL (ref 0.61–1.24)
GFR calc Af Amer: >60 mL/min (ref >60)
GFR calc non Af Amer: >60 mL/min (ref >60)
Glucose, Bld: 111 mg/dL — ABNORMAL HIGH (ref 70–99)
Potassium: 4.9 mmol/L (ref 3.5–5.1)
Sodium: 148 mmol/L — ABNORMAL HIGH (ref 135–145)

## 2018-10-15 MED ORDER — CHLORHEXIDINE GLUCONATE 0.12 % MT SOLN
15.0000 mL | Freq: Two times a day (BID) | OROMUCOSAL | Status: DC
  Administered 2018-10-15 – 2018-10-18 (×7): 15 mL via OROMUCOSAL
  Filled 2018-10-15 (×3): qty 15

## 2018-10-15 MED ORDER — ACETAMINOPHEN 325 MG PO TABS
650.0000 mg | ORAL_TABLET | Freq: Four times a day (QID) | ORAL | Status: DC | PRN
  Administered 2018-10-15 – 2018-10-18 (×6): 650 mg via ORAL
  Filled 2018-10-15 (×6): qty 2

## 2018-10-15 MED ORDER — ACETAMINOPHEN 650 MG RE SUPP: 650.0000 mg | Freq: Four times a day (QID) | RECTAL | Status: DC | PRN

## 2018-10-15 MED ORDER — ORAL CARE MOUTH RINSE
15.0000 mL | Freq: Two times a day (BID) | OROMUCOSAL | Status: DC
  Administered 2018-10-15 – 2018-10-18 (×8): 15 mL via OROMUCOSAL

## 2018-10-15 NOTE — Progress Notes (Signed)
PROGRESS NOTE    Ryan Collins  JXB:[id number] DOB: March 06, 1961 DOA: 10/13/2018 PCP: Lanae Boast, Export (Confirm with patient/family/NH records and if not entered, this HAS to be entered at [facility] point of entry. "No PCP" if truly none.)   Brief Narrative:   Assessment & Plan:   Active Problems:   Respiratory failure ([place])   Brain metastases ([place])   Glasgow coma scale total score 3-8 ([place])   Palliative care by specialist  Acute metabolic encephalopathy. Questionable seizure with postictal state, resolved  In the setting of known cerebral metastasis  Neurology following - defer to their expertise for antiepileptic medication titrations No clear seizure at this point  Continuing IV Decadron  Serial neuro checks   Acute hypoxic respiratory failure in the setting of ineffective airway protection secondary to above Extubated 10/14/2018 successfully, wean oxygen as tolerated  Widely metastatic gastric carcinoma with metastasis to brain throughout the abdomen and also thorax, now with progressive failure to thrive -Currently on Keytruda -Already undergone extensive chemotherapy and surgery Palliative care consulted, unclear if patient willing to discuss with hospice at this point -may need family discussion in the next 24 hours pending clinical course  Anion gap metabolic acidosis with resolving lactic acidosis following seizure Continue IV fluids until p.o. intake increases adequately  Anemia of chronic disease without evidence of hemorrhage Follow morning labs, no clear etiology for blood loss  Protein calorie malnutrition in setting of malignancy Nutritional consult following  DVT prophylaxis: SCDs only Code Status: Full Disposition Plan: Patient continues in the inpatient setting given poor p.o. intake, failure to thrive, tori dysfunction with metastatic disease as above.  Patient currently being evaluated by palliative care versus hospice pending further discussion with family  patient has continued to worsen despite aggressive treatment as above.  Subjective: No acute issues or events overnight, his chest pain, shortness of breath, nausea, vomiting, diarrhea, constipation, headache, fever, chills.  Objective: Vitals:   10/15/18 1100 10/15/18 1200 10/15/18 1300 10/15/18 1400  BP: (!) 144/118 (!) 144/96 (!) 125/100 139/89  Pulse: 76 83 83 98  Resp: (!) 23 18 14  (!) 23  Temp:  98.9 F (37.2 C)    TempSrc:  Oral    SpO2: 100% 100% 99% 93%  Weight:      Height:        Intake/Output Summary (Last 24 hours) at 10/15/2018 1441 Last data filed at 10/15/2018 1400 Gross per 24 hour  Intake 1959.08 ml  Output 2375 ml  Net -415.92 ml   Filed Weights   10/13/18 2205 10/14/18 0500 10/15/18 0352  Weight: 49 kg 49 kg 46.8 kg    Examination:  General:  Pleasantly resting in bed, No acute distress.  Cachectic appearing older than stated age 58:  Normocephalic atraumatic.  Sclerae nonicteric, noninjected.  Extraocular movements intact bilaterally. Neck:  Without mass or deformity.  Trachea is midline. Lungs:  Clear to auscultate bilaterally without rhonchi, wheeze, or rales. Heart:  Regular rate and rhythm.  Without murmurs, rubs, or gallops. Abdomen:  Soft, nontender, nondistended.  Without guarding or rebound. Extremities: Without cyanosis, clubbing, edema, or obvious deformity. Vascular:  Dorsalis pedis and posterior tibial pulses palpable bilaterally. Skin:  Warm and dry, no erythema, no ulcerations.  Data Reviewed: I have personally reviewed following labs and imaging studies  CBC: Recent Labs  Lab 10/13/18 1921 10/13/18 1950 10/13/18 2042 10/14/18 0219 10/15/18 0146  WBC 4.7  --   --  6.3 11.3*  NEUTROABS 3.2  --   --   --   --  HGB 12.1* 13.6 10.9* 12.6* 12.7*  HCT 37.0* 40.0 32.0* 35.7* 36.3*  MCV 88.9  --   --  82.8 83.6  PLT 245  --   --  296 465   Basic Metabolic Panel: Recent Labs  Lab 10/13/18 1921 10/13/18 1950 10/13/18 2042  10/14/18 0219 10/15/18 0146  NA 137 134* 137 140 148*  K 4.4 4.0 4.2 4.8 4.9  CL 106 108  --  111 121*  CO2 13*  --   --  16* 19*  GLUCOSE 64* 62*  --  125* 111*  BUN 19 20  --  18 12  CREATININE 1.12 0.90  --  0.93 0.70  CALCIUM 8.7*  --   --  8.2* 8.4*  MG 2.5*  --   --   --   --    GFR: Estimated Creatinine Clearance: 67.4 mL/min (by C-G formula based on SCr of 0.7 mg/dL). Liver Function Tests: Recent Labs  Lab 10/13/18 1921  AST 33  ALT QUANTITY NOT SUFFICIENT, UNABLE TO PERFORM TEST  ALKPHOS 53  BILITOT 0.8  PROT 5.1*  ALBUMIN 2.2*   No results for input(s): LIPASE, AMYLASE in the last 168 hours. Recent Labs  Lab 10/13/18 1930  AMMONIA 80*   Coagulation Profile: Recent Labs  Lab 10/13/18 1921  INR 1.3*   Cardiac Enzymes: Recent Labs  Lab 10/13/18 1921  TROPONINI <0.03   BNP (last 3 results) No results for input(s): PROBNP in the last 8760 hours. HbA1C: No results for input(s): HGBA1C in the last 72 hours. CBG: No results for input(s): GLUCAP in the last 168 hours. Lipid Profile: Recent Labs    10/14/18 0219  TRIG 81   Thyroid Function Tests: No results for input(s): TSH, T4TOTAL, FREET4, T3FREE, THYROIDAB in the last 72 hours. Anemia Panel: No results for input(s): VITAMINB12, FOLATE, FERRITIN, TIBC, IRON, RETICCTPCT in the last 72 hours. Sepsis Labs: Recent Labs  Lab 10/13/18 2005 10/13/18 2228  LATICACIDVEN 7.1* 3.8*    Recent Results (from the past 240 hour(s))  SARS Coronavirus 2 (CEPHEID - Performed in [place] hospital lab), Hosp Order     Status: None   Collection Time: 10/13/18  8:25 PM  Result Value Ref Range Status   SARS Coronavirus 2 NEGATIVE NEGATIVE Final    Comment: (NOTE) If result is NEGATIVE SARS-CoV-2 target nucleic acids are NOT DETECTED. The SARS-CoV-2 RNA is generally detectable in upper and lower  respiratory specimens during the acute phase of infection. The lowest  concentration of SARS-CoV-2 viral copies  this assay can detect is 250  copies / mL. A negative result does not preclude SARS-CoV-2 infection  and should not be used as the sole basis for treatment or other  patient management decisions.  A negative result may occur with  improper specimen collection / handling, submission of specimen other  than nasopharyngeal swab, presence of viral mutation(s) within the  areas targeted by this assay, and inadequate number of viral copies  (<250 copies / mL). A negative result must be combined with clinical  observations, patient history, and epidemiological information. If result is POSITIVE SARS-CoV-2 target nucleic acids are DETECTED. The SARS-CoV-2 RNA is generally detectable in upper and lower  respiratory specimens dur ing the acute phase of infection.  Positive  results are indicative of active infection with SARS-CoV-2.  Clinical  correlation with patient history and other diagnostic information is  necessary to determine patient infection status.  Positive results do  not rule out bacterial  infection or co-infection with other viruses. If result is PRESUMPTIVE POSTIVE SARS-CoV-2 nucleic acids MAY BE PRESENT.   A presumptive positive result was obtained on the submitted specimen  and confirmed on repeat testing.  While 2019 novel coronavirus  (SARS-CoV-2) nucleic acids may be present in the submitted sample  additional confirmatory testing may be necessary for epidemiological  and / or clinical management purposes  to differentiate between  SARS-CoV-2 and other Sarbecovirus currently known to infect humans.  If clinically indicated additional testing with an alternate test  methodology [id number]) is advised. The SARS-CoV-2 RNA is generally  detectable in upper and lower respiratory sp ecimens during the acute  phase of infection. The expected result is Negative. Fact Sheet for Patients:  StrictlyIdeas.no Fact Sheet for Healthcare Providers:  BankingDealers.co.za This test is not yet approved or cleared by the [place] FDA and has been authorized for detection and/or diagnosis of SARS-CoV-2 by FDA under an Emergency Use Authorization (EUA).  This EUA will remain in effect (meaning this test can be used) for the duration of the COVID-19 declaration under Section 564(b)(1) of the Act, 21 U.S.C. section 360bbb-3(b)(1), unless the authorization is terminated or revoked sooner. Performed at [facility], [place] [address] [place], [place] [address]   MRSA PCR Screening     Status: None   Collection Time: 10/13/18  9:58 PM  Result Value Ref Range Status   MRSA by PCR NEGATIVE NEGATIVE Final    Comment:        The GeneXpert MRSA Assay (FDA approved for NASAL specimens only), is one component of a comprehensive MRSA colonization surveillance program. It is not intended to diagnose MRSA infection nor to guide or monitor treatment for MRSA infections. Performed at [facility], [place] [address], [place], [place] [address]      Radiology Studies: Ct Head Wo Contrast  Result Date: 10/14/2018 CLINICAL DATA:  Stroke follow-up EXAM: CT HEAD WITHOUT CONTRAST TECHNIQUE: Contiguous axial images were obtained from the base of the skull through the vertex without intravenous contrast. COMPARISON:  None. FINDINGS: Brain: Unchanged size and distribution of multiple hemorrhagic lesions scattered throughout both cerebral hemispheres, the largest of which is in the left temporal lobe. The surrounding vasogenic edema patterns are unchanged. Size and configuration of the CSF spaces are also unchanged. Thickened appearance of the pituitary infundibulum. Vascular: No abnormal hyperdensity of the major intracranial arteries or dural venous sinuses. No intracranial atherosclerosis. Skull: The visualized skull base, calvarium and extracranial soft tissues are normal. Sinuses/Orbits: No fluid levels or advanced mucosal  thickening of the visualized paranasal sinuses. No mastoid or middle ear effusion. The orbits are normal. IMPRESSION: 1. Unchanged size and configuration of multiple hemorrhagic lesions scattered throughout both hemispheres (approximately 5). Unchanged edema pattern. MRI of the brain with and without contrast is recommended. 2. Thickened appearance of the pituitary infundibulum may also indicate metastatic disease. Attention recommended on MRI. Electronically Signed   By: Ulyses Jarred M.D.   On: 10/14/2018 03:51   Dg Chest Port 1 View  Result Date: 10/14/2018 CLINICAL DATA:  History of stomach carcinoma, check tube placement EXAM: PORTABLE CHEST 1 VIEW COMPARISON:  10/13/2018, CT from 09/17/2010 FINDINGS: Cardiac shadows within normal limits. Endotracheal tube, gastric catheter right chest wall port are noted. Gastric catheter is noted coiled upon itself within the distal esophagus. Multiple nodular changes are noted in both lungs consistent with metastatic disease. No acute bony abnormality is noted. IMPRESSION: Stable pulmonary metastasis. Gastric catheter coiled  upon itself within the distal esophagus. Electronically Signed   By: Inez Catalina M.D.   On: 10/14/2018 07:37   Dg Chest Portable 1 View  Result Date: 10/13/2018 CLINICAL DATA:  Gastric cancer.  Patient intubated. EXAM: PORTABLE CHEST 1 VIEW COMPARISON:  CT scan Sep 17, 2018 FINDINGS: Pulmonary nodularity was better assessed on the previous CT scan and is new since July 10, 2017, consistent with patient's known pulmonary metastases. An ETT terminates in good position. A right Port-A-Cath terminates in the central SVC. No pneumothorax. Skin folds over the upper right chest. No acute infiltrate. IMPRESSION: 1. Support apparatus as above. 2. Known pulmonary metastases. Electronically Signed   By: Dorise Bullion III M.D   On: 10/13/2018 19:46   Dg Abd Portable 1v  Result Date: 10/14/2018 CLINICAL DATA:  Check gastric catheter placement EXAM:  PORTABLE ABDOMEN - 1 VIEW COMPARISON:  None. FINDINGS: Gastric catheter is noted coiled within the distal esophagus. This should be withdrawn and readvanced into the stomach. Scattered large and small bowel gas is noted. IMPRESSION: Gastric catheter coiled within the distal esophagus. Electronically Signed   By: Inez Catalina M.D.   On: 10/14/2018 07:35   Ct Head Code Stroke Wo Contrast`  Result Date: 10/13/2018 CLINICAL DATA:  Code stroke.  Altered mental status EXAM: CT HEAD WITHOUT CONTRAST TECHNIQUE: Contiguous axial images were obtained from the base of the skull through the vertex without intravenous contrast. COMPARISON:  None. FINDINGS: Brain: There are multiple hemorrhagic lesions, the largest of which is located in the left temporal lobe and measures 16 mm. There are at least 5 lesions. No infratentorial lesions are visible. There is no midline shift or other mass effect. Moderate edema surrounds all lesions. No hydrocephalus. Vascular: Atherosclerotic calcification of the internal carotid arteries at the skull base. No abnormal hyperdensity of the major intracranial arteries or dural venous sinuses. Skull: The visualized skull base, calvarium and extracranial soft tissues are normal. Sinuses/Orbits: No fluid levels or advanced mucosal thickening of the visualized paranasal sinuses. No mastoid or middle ear effusion. The orbits are normal. IMPRESSION: 1. At least 5 hyperdense metastases with surrounding edema, greatest in the left temporal lobe. Small volume hemorrhage is associated with most of the lesions. 2. ASPECTS is not applicable in the setting of hemorrhage These results were communicated to Dr. Roland Rack at 8:02 pm on 10/13/2018 by text page via the [facility] messaging system. Electronically Signed   By: Ulyses Jarred M.D.   On: 10/13/2018 20:06   Scheduled Meds: . chlorhexidine  15 mL Mouth Rinse BID  . Chlorhexidine Gluconate Cloth  6 each Topical Daily  . dexamethasone  4 mg  Intravenous Q6H  . mouth rinse  15 mL Mouth Rinse q12n4p   Continuous Infusions: . lactated ringers 75 mL/hr at 10/15/18 1400  . levETIRAcetam Stopped (10/15/18 1306)     LOS: 2 days    Time spent: 70min  William C Lancaster, DO [facility] Pager [phone]  If 7PM-7AM, please contact night-coverage [email or web address] Password [email or web address] 10/15/2018, 2:41 PM

## 2018-10-15 NOTE — Progress Notes (Addendum)
Ryan Collins   DOB:02/20/1961   QZ#:[id number]   FHL#:[id number]  Oncology follow up  Subjective: Patient is well-known to me, under my care for his metastatic gastric cancer.  He was admitted for seizure.  He was extubated yesterday.  He is awake, alert, able to answer most of my questions, although appears to be slightly confused some time.  His nurse, he did eat and drink some, not a lot, but has not gotten out of bed.    Objective:  Vitals:   10/15/18 1900 10/15/18 2000  BP: 126/85   Pulse: 99   Resp: 18   Temp:  99 F (37.2 C)  SpO2: 100%     Body mass index is 14.8 kg/m.  Intake/Output Summary (Last 24 hours) at 10/15/2018 2020 Last data filed at 10/15/2018 1800 Gross per 24 hour  Intake 1821.31 ml  Output 1850 ml  Net -28.69 ml     Sclerae unicteric  Lungs clear -- no rales or rhonchi  Heart regular rate and rhythm  Abdomen benign soft    CBG (last 3)  No results for input(s): GLUCAP in the last 72 hours.   Labs:  Urine Studies No results for input(s): UHGB, CRYS in the last 72 hours.  Invalid input(s): UACOL, UAPR, USPG, UPH, UTP, UGL, [place], UBIL, UNIT, UROB, [place], UEPI, UWBC, Duwayne Heck [place], [place]  Basic Metabolic Panel: Recent Labs  Lab 10/13/18 1921 10/13/18 1950 10/13/18 2042 10/14/18 0219 10/15/18 0146  NA 137 134* 137 140 148*  K 4.4 4.0 4.2 4.8 4.9  CL 106 108  --  111 121*  CO2 13*  --   --  16* 19*  GLUCOSE 64* 62*  --  125* 111*  BUN 19 20  --  18 12  CREATININE 1.12 0.90  --  0.93 0.70  CALCIUM 8.7*  --   --  8.2* 8.4*  MG 2.5*  --   --   --   --    GFR Estimated Creatinine Clearance: 67.4 mL/min (by C-G formula based on SCr of 0.7 mg/dL). Liver Function Tests: Recent Labs  Lab 10/13/18 1921  AST 33  ALT QUANTITY NOT SUFFICIENT, UNABLE TO PERFORM TEST  ALKPHOS 53  BILITOT 0.8  PROT 5.1*  ALBUMIN 2.2*   No results for input(s): LIPASE, AMYLASE in the last 168 hours. Recent Labs  Lab 10/13/18 1930  AMMONIA 80*    Coagulation profile Recent Labs  Lab 10/13/18 1921  INR 1.3*    CBC: Recent Labs  Lab 10/13/18 1921 10/13/18 1950 10/13/18 2042 10/14/18 0219 10/15/18 0146  WBC 4.7  --   --  6.3 11.3*  NEUTROABS 3.2  --   --   --   --   HGB 12.1* 13.6 10.9* 12.6* 12.7*  HCT 37.0* 40.0 32.0* 35.7* 36.3*  MCV 88.9  --   --  82.8 83.6  PLT 245  --   --  296 244   Cardiac Enzymes: Recent Labs  Lab 10/13/18 1921  TROPONINI <0.03   BNP: Invalid input(s): POCBNP CBG: No results for input(s): GLUCAP in the last 168 hours. D-Dimer No results for input(s): DDIMER in the last 72 hours. Hgb A1c No results for input(s): HGBA1C in the last 72 hours. Lipid Profile Recent Labs    10/14/18 0219  TRIG 81   Thyroid function studies No results for input(s): TSH, T4TOTAL, T3FREE, THYROIDAB in the last 72 hours.  Invalid input(s): FREET3 Anemia work up No results for input(s): VITAMINB12,  FOLATE, FERRITIN, TIBC, IRON, RETICCTPCT in the last 72 hours. Microbiology Recent Results (from the past 240 hour(s))  SARS Coronavirus 2 (CEPHEID - Performed in [place] hospital lab), Hosp Order     Status: None   Collection Time: 10/13/18  8:25 PM  Result Value Ref Range Status   SARS Coronavirus 2 NEGATIVE NEGATIVE Final    Comment: (NOTE) If result is NEGATIVE SARS-CoV-2 target nucleic acids are NOT DETECTED. The SARS-CoV-2 RNA is generally detectable in upper and lower  respiratory specimens during the acute phase of infection. The lowest  concentration of SARS-CoV-2 viral copies this assay can detect is 250  copies / mL. A negative result does not preclude SARS-CoV-2 infection  and should not be used as the sole basis for treatment or other  patient management decisions.  A negative result may occur with  improper specimen collection / handling, submission of specimen other  than nasopharyngeal swab, presence of viral mutation(s) within the  areas targeted by this assay, and inadequate  number of viral copies  (<250 copies / mL). A negative result must be combined with clinical  observations, patient history, and epidemiological information. If result is POSITIVE SARS-CoV-2 target nucleic acids are DETECTED. The SARS-CoV-2 RNA is generally detectable in upper and lower  respiratory specimens dur ing the acute phase of infection.  Positive  results are indicative of active infection with SARS-CoV-2.  Clinical  correlation with patient history and other diagnostic information is  necessary to determine patient infection status.  Positive results do  not rule out bacterial infection or co-infection with other viruses. If result is PRESUMPTIVE POSTIVE SARS-CoV-2 nucleic acids MAY BE PRESENT.   A presumptive positive result was obtained on the submitted specimen  and confirmed on repeat testing.  While 2019 novel coronavirus  (SARS-CoV-2) nucleic acids may be present in the submitted sample  additional confirmatory testing may be necessary for epidemiological  and / or clinical management purposes  to differentiate between  SARS-CoV-2 and other Sarbecovirus currently known to infect humans.  If clinically indicated additional testing with an alternate test  methodology [id number]) is advised. The SARS-CoV-2 RNA is generally  detectable in upper and lower respiratory sp ecimens during the acute  phase of infection. The expected result is Negative. Fact Sheet for Patients:  StrictlyIdeas.no Fact Sheet for Healthcare Providers: BankingDealers.co.za This test is not yet approved or cleared by the [place] FDA and has been authorized for detection and/or diagnosis of SARS-CoV-2 by FDA under an Emergency Use Authorization (EUA).  This EUA will remain in effect (meaning this test can be used) for the duration of the COVID-19 declaration under Section 564(b)(1) of the Act, 21 U.S.C. section 360bbb-3(b)(1), unless the  authorization is terminated or revoked sooner. Performed at [facility], [place] [address], [place], [place] [address]   MRSA PCR Screening     Status: None   Collection Time: 10/13/18  9:58 PM  Result Value Ref Range Status   MRSA by PCR NEGATIVE NEGATIVE Final    Comment:        The GeneXpert MRSA Assay (FDA approved for NASAL specimens only), is one component of a comprehensive MRSA colonization surveillance program. It is not intended to diagnose MRSA infection nor to guide or monitor treatment for MRSA infections. Performed at [facility], [place] [address], [place], [place] [address]       Studies:  Ct Head Wo Contrast  Result Date: 10/14/2018 CLINICAL DATA:  Stroke follow-up  EXAM: CT HEAD WITHOUT CONTRAST TECHNIQUE: Contiguous axial images were obtained from the base of the skull through the vertex without intravenous contrast. COMPARISON:  None. FINDINGS: Brain: Unchanged size and distribution of multiple hemorrhagic lesions scattered throughout both cerebral hemispheres, the largest of which is in the left temporal lobe. The surrounding vasogenic edema patterns are unchanged. Size and configuration of the CSF spaces are also unchanged. Thickened appearance of the pituitary infundibulum. Vascular: No abnormal hyperdensity of the major intracranial arteries or dural venous sinuses. No intracranial atherosclerosis. Skull: The visualized skull base, calvarium and extracranial soft tissues are normal. Sinuses/Orbits: No fluid levels or advanced mucosal thickening of the visualized paranasal sinuses. No mastoid or middle ear effusion. The orbits are normal. IMPRESSION: 1. Unchanged size and configuration of multiple hemorrhagic lesions scattered throughout both hemispheres (approximately 5). Unchanged edema pattern. MRI of the brain with and without contrast is recommended. 2. Thickened appearance of the pituitary infundibulum may also indicate metastatic disease. Attention  recommended on MRI. Electronically Signed   By: Ulyses Jarred M.D.   On: 10/14/2018 03:51   Dg Chest Port 1 View  Result Date: 10/14/2018 CLINICAL DATA:  History of stomach carcinoma, check tube placement EXAM: PORTABLE CHEST 1 VIEW COMPARISON:  10/13/2018, CT from 09/17/2010 FINDINGS: Cardiac shadows within normal limits. Endotracheal tube, gastric catheter right chest wall port are noted. Gastric catheter is noted coiled upon itself within the distal esophagus. Multiple nodular changes are noted in both lungs consistent with metastatic disease. No acute bony abnormality is noted. IMPRESSION: Stable pulmonary metastasis. Gastric catheter coiled upon itself within the distal esophagus. Electronically Signed   By: Inez Catalina M.D.   On: 10/14/2018 07:37   Dg Abd Portable 1v  Result Date: 10/14/2018 CLINICAL DATA:  Check gastric catheter placement EXAM: PORTABLE ABDOMEN - 1 VIEW COMPARISON:  None. FINDINGS: Gastric catheter is noted coiled within the distal esophagus. This should be withdrawn and readvanced into the stomach. Scattered large and small bowel gas is noted. IMPRESSION: Gastric catheter coiled within the distal esophagus. Electronically Signed   By: Inez Catalina M.D.   On: 10/14/2018 07:35    Assessment: 58 y.o. with metastatic gastric cancer, admitted for AMS and seizure  1.  Acute encephalopathy, with probable seizure, secondary to #2 2. Multiple brain metastasis with hemorrhage  3.  Metastatic gastric cancer, with recent disease progression, currently on third line Keytruda 4. Anemia of chronic disease 5.  Severe protein and calorie malnutrition  Plan:  -I reviewed his CT brain scan findings, he seems to be able to understand. -we discussed his recent cancer progression, and overall rapid deterioration in the past month due to cancer progression.  Due to multiple line prior chemo, and low PD-L1 expression in his tumor, I do not anticipate he will have great response to current third  line Keytruda which he just started on 5/20. If his PS does not improve, he may not be a candidate for more therapy. -we discussed the option of brain radiation if he is able to recover reasonably well, versus palliative care and hospice alone.  After lengthy discussion, patient again expressed his wishes to get more treatment, including brain radiation, but is also agreeable with hospice if he does not recover well. He seems to understand his very poor prognosis.  -I recommend DNR/DNI given his cancer progression, he initially was reluctant, but agreed after discussion.  -I called his mother who is his HCPOA, she however does not agree with DNR/DNI for now, and  wants more time to discuss with other family members, especially her daughter Denman George who is a Marine scientist. With pt and his mother's permission, I called Yolanda and left her a VM.  -please consult PT/OT, continue steroids, and see how much pt can improve, and then we will make a decision on brain radiation, vs hospice.  -I will follow up, I appreciate the care from hospitalist and palliative care teams.   I spent a total of 60 mins on his visit today, >50% on face-to-face counseling.  [name], MD 10/15/2018

## 2018-10-15 NOTE — Progress Notes (Signed)
Pharmacy is unable to confirm the medications the patient was taking at home. All options have been exhausted and a resolution to the situation is not expected.   Where possible, their outpatient pharmacy(s) have been contacted for the last time prescriptions were filled and that information has been added to each medication in an Order Note (highlighted yellow below the medication).  Please contact pharmacy if further assistance is needed.   Romeo Rabon, PharmD. Mobile: [phone]. 10/15/2018,8:39 AM.

## 2018-10-15 NOTE — Evaluation (Signed)
Clinical/Bedside Swallow Evaluation Patient Details  Name: Prabhav Faulkenberry MRN: [id number] Date of Birth: 09-16-1960  Today's Date: 10/15/2018 Time: SLP Start Time (ACUTE ONLY): 1118 SLP Stop Time (ACUTE ONLY): 1145 SLP Time Calculation (min) (ACUTE ONLY): 27 min  Past Medical History:  Past Medical History:  Diagnosis Date  . Cancer [facility])    gastric cancer  . DVT (deep venous thrombosis) ([place]) 2011  . GI bleeding 2001  . History of DVT of lower extremity   . Hypertension   . Melena 05/2017  . Stomach ulcer    Clinically suspected; No EGD confirmation as of 06/03/17   Past Surgical History:  Past Surgical History:  Procedure Laterality Date  . ESOPHAGOGASTRODUODENOSCOPY  06/04/2017  . ESOPHAGOGASTRODUODENOSCOPY N/A 06/04/2017   Procedure: ESOPHAGOGASTRODUODENOSCOPY (EGD);  Surgeon: Carol Ada, MD;  Location: [place];  Service: Endoscopy;  Laterality: N/A;  . GASTRECTOMY N/A 02/25/2018   Procedure: TOTAL GASTRECTOMY WITH J TUBE PLACEMENT;  Surgeon: [name], MD;  Location: [place];  Service: General;  Laterality: N/A;  . IR FLUORO GUIDE PORT INSERTION RIGHT  06/20/2017  . IR US GUIDE VASC ACCESS RIGHT  06/20/2017  . JEJUNOSTOMY N/A 02/25/2018   Procedure: JEJUNOSTOMY TUBE;  Surgeon: [name], MD;  Location: [place];  Service: General;  Laterality: N/A;  . LAPAROSCOPY N/A 02/25/2018   Procedure: LAPAROSCOPY DIAGNOSTIC;  Surgeon: [name], MD;  Location: [place];  Service: General;  Laterality: N/A;  . SPLENECTOMY, TOTAL N/A 02/25/2018   Procedure: SPLENECTOMY;  Surgeon: [name], MD;  Location: [place];  Service: General;  Laterality: N/A;   HPI:  Ryan Collins is a 58 y.o. male with a history of gastric cancer with metastatic disease to the lungs who presents with unresponsiveness. Intubated 6/1-6/2. Per Palliative care note pt has lost weight. Had feeding tube but removed in Dec 2019. CXR Stable pulmonary metastasis. Other PMH: HTN, GI bleed, DVT.   Assessment / Plan  / Recommendation Clinical Impression  Pt is significantly cachectic with decreased intake prior to admission, eating softer foods, soups prior to admission. Pt reports frequent hiccups. SLP suspects esophageal involvement secondary to multiple swallows, frequent eructation and pt reported radiation to stomach area (?). Episode of immediate throat clearing, delayed coughing and wet vocal quality at end of evaluation. He may have post prandial reflux. Recommend pt initiate regular diet texture to allow a variety of choices which are softer and that pt feels he can tolerate. Upright posture, stay upright 45 min after meals, smaller and more frequent meals. ST will continue to follow.   SLP Visit Diagnosis: Dysphagia, unspecified (R13.10)    Aspiration Risk  Moderate aspiration risk    Diet Recommendation Regular;Thin liquid   Liquid Administration via: Cup;Straw Medication Administration: Crushed with puree Supervision: Patient able to self feed;Full supervision/cueing for compensatory strategies Compensations: Slow rate;Minimize environmental distractions;Small sips/bites Postural Changes: Seated upright at 90 degrees;Remain upright for at least 30 minutes after po intake    Other  Recommendations Oral Care Recommendations: Oral care BID   Follow up Recommendations Other (comment)(TBD)      Frequency and Duration min 2x/week  2 weeks       Prognosis Prognosis for Safe Diet Advancement: Fair Barriers to Reach Goals: Other (Comment)(nutritional status)      Swallow Study   General HPI: Ryan Collins is a 58 y.o. male with a history of gastric cancer with metastatic disease to the lungs who presents with unresponsiveness. Intubated 6/1-6/2. Per Palliative care note pt has lost  weight. Had feeding tube but removed in Dec 2019. CXR Stable pulmonary metastasis. Other PMH: HTN, GI bleed, DVT. Type of Study: Bedside Swallow Evaluation Previous Swallow Assessment: (none) Diet Prior to this  Study: NPO Temperature Spikes Noted: No Respiratory Status: Nasal cannula History of Recent Intubation: Yes Length of Intubations (days): (1-2) Date extubated: 10/14/18 Behavior/Cognition: Alert;Cooperative;Pleasant mood Oral Cavity Assessment: Within Functional Limits Oral Care Completed by SLP: Yes Oral Cavity - Dentition: Adequate natural dentition Vision: Functional for self-feeding Self-Feeding Abilities: Needs assist Patient Positioning: Upright in bed Baseline Vocal Quality: Normal Volitional Cough: Weak Volitional Swallow: Able to elicit    Oral/Motor/Sensory Function Overall Oral Motor/Sensory Function: Within functional limits   Ice Chips Ice chips: Within functional limits Presentation: Spoon   Thin Liquid Thin Liquid: Impaired Presentation: Cup;Straw Pharyngeal  Phase Impairments: Throat Clearing - Immediate;Wet Vocal Quality;Cough - Delayed    Nectar Thick Nectar Thick Liquid: Not tested   Honey Thick Honey Thick Liquid: Not tested   Puree Puree: Within functional limits Presentation: Spoon   Solid     Solid: (pt declined)      Houston Siren 10/15/2018,3:03 PM  Orbie Pyo Colvin Caroli.Ed Risk analyst [phone] Office [phone]

## 2018-10-15 NOTE — Progress Notes (Signed)
Palliative:  HPI: 58 yo with PMH significant for metastatic gastric cancer (follows Dr. Burr Medico and just began 3rd line treatment with [facility]), DVT, GIB, HTN admitted 10/13/18 with respiratory distress requiring intubation with concern for seizure activity and signs of brain mets with hemorrhage and cancer progression throughout abdomen and thorax. He has been successfully extubated but continues with some confusion.   I met today at Mr. Skibicki bedside. I was hoping he would be more alert to have conversation and he is certainly more alert and able to communicate and it appears per RN report that he is more oriented. However, during my conversation with him he is very fixated on getting something to eat and requesting food and will not answer any questions or address any other topics. He just repeats "I'm hungry." He does complain of headache but says this is because he needs food. Tylenol added prn. RN to f/u with SLP to see for diet recommendations.   I called and spoke with his mother and told her of my interactions. She asks me about having hospice at home for him and says he lives with his brother and she can come stay and help care for him. She is a retired Quarry manager and worked many years with hospice. She also has a daughter who is a Therapist, sports but works but could assist some as well. I told her that we will better know over the next few days his status and his care needs. She is very focused on getting him as strong as possible before he comes home. She mentions [employer] but says she is not ready for this and "I'm not ready to lose my son" and shares with me about her daughter dying from breast cancer ~3 years ago. She talks of making sure that we have done everything to give her son more time and questions if everything was offered and done for her daughter. She also is very spiritual and talks of relying on God to care for him and take him when it is his time but again "only after man has tried everything."    Mother was appreciative of update. She was on her way out and we did not have an opportunity to discuss code status. I will follow up with Mr. Tapp and family again tomorrow. I feel she will be open to any options such as radiation if offered. She does understand that his prognosis is poor (she mentions hospice care) and will need to readdress code status.   Exam: Alert, confused. No distress. Cachectic. Frail.   Plan: - Will need continued discussions.  - Family hopeful for further treatment options but also open to hospice if these are not offered.   17 min   Vinie Sill, NP Palliative Medicine Team Pager # [phone] (M-F 8a-5p) Team Phone # [phone] (Nights/Weekends)

## 2018-10-15 NOTE — Progress Notes (Signed)
RUE and RLE 4/5 strength Continue Keppra, Dex MR Brain w/wo contrast when able to be performed (if pt and family still want aggressive treatment of metastatic cancer)  Continue follow up with palliative team

## 2018-10-16 LAB — CBC
HCT: 33.6 % — ABNORMAL LOW (ref 39.0–52.0)
Hemoglobin: 11.4 g/dL — ABNORMAL LOW (ref 13.0–17.0)
MCH: 29.2 pg (ref 26.0–34.0)
MCHC: 33.9 g/dL (ref 30.0–36.0)
MCV: 85.9 fL (ref 80.0–100.0)
Platelets: 252 10*3/uL (ref 150–400)
RBC: 3.91 MIL/uL — ABNORMAL LOW (ref 4.22–5.81)
RDW: 22.9 % — ABNORMAL HIGH (ref 11.5–15.5)
WBC: 10.8 10*3/uL — ABNORMAL HIGH (ref 4.0–10.5)
nRBC: 0.4 % — ABNORMAL HIGH (ref 0.0–0.2)

## 2018-10-16 LAB — BASIC METABOLIC PANEL
Anion gap: 10 (ref 5–15)
Anion gap: 10 (ref 5–15)
BUN: 11 mg/dL (ref 6–20)
BUN: 11 mg/dL (ref 6–20)
CO2: 18 mmol/L — ABNORMAL LOW (ref 22–32)
CO2: 20 mmol/L — ABNORMAL LOW (ref 22–32)
Calcium: 8.5 mg/dL — ABNORMAL LOW (ref 8.9–10.3)
Calcium: 8.8 mg/dL — ABNORMAL LOW (ref 8.9–10.3)
Chloride: 119 mmol/L — ABNORMAL HIGH (ref 98–111)
Chloride: 119 mmol/L — ABNORMAL HIGH (ref 98–111)
Creatinine, Ser: 0.62 mg/dL (ref 0.61–1.24)
Creatinine, Ser: 0.68 mg/dL (ref 0.61–1.24)
GFR calc Af Amer: >60 mL/min (ref >60)
GFR calc Af Amer: >60 mL/min (ref >60)
GFR calc non Af Amer: >60 mL/min (ref >60)
GFR calc non Af Amer: >60 mL/min (ref >60)
Glucose, Bld: 103 mg/dL — ABNORMAL HIGH (ref 70–99)
Glucose, Bld: 94 mg/dL (ref 70–99)
Potassium: 6 mmol/L — ABNORMAL HIGH (ref 3.5–5.1)
Potassium: 4 mmol/L (ref 3.5–5.1)
Sodium: 147 mmol/L — ABNORMAL HIGH (ref 135–145)
Sodium: 149 mmol/L — ABNORMAL HIGH (ref 135–145)

## 2018-10-16 NOTE — Evaluation (Addendum)
Physical Therapy Evaluation Patient Details Name: Ryan Collins MRN: [id number] DOB: 30-Dec-1960 Today's Date: 10/16/2018   History of Present Illness  Pt is a 58 y.o. M with significant PMH of metastatic gastric cancer, currently on third line Keytruda, with multiple brain metastasis who was admitted for AMS and seizure. Also noted to have severe protein and calorie malnutrition.    Clinical Impression  Pt admitted with above diagnosis. Pt currently with functional limitations due to the deficits listed below (see PT Problem List). Pt presents with decreased functional mobility secondary to generalized weakness, balance impairments, confusion, and decreased cardiovascular endurance. Pt ambulating 30 feet with and without a walker, HR peak 135 bpm, SpO2 98% on RA. Pt requiring up to min assist for functional mobility. Recommend walker for home mobility. Pt will benefit from skilled PT to increase their independence and safety with mobility to allow discharge to the venue listed below.       Follow Up Recommendations Home health PT;Supervision/Assistance - 24 hour    Equipment Recommendations  Rolling walker with 5" wheels    Recommendations for Other Services       Precautions / Restrictions Precautions Precautions: Fall Restrictions Weight Bearing Restrictions: No      Mobility  Bed Mobility Overal bed mobility: Needs Assistance Bed Mobility: Supine to Sit     Supine to sit: Supervision        Transfers Overall transfer level: Needs assistance Equipment used: None Transfers: Sit to/from Stand Sit to Stand: Min assist         General transfer comment: Light min assist provided to stabilize   Ambulation/Gait Ambulation/Gait assistance: Min guard Gait Distance (Feet): 30 Feet Assistive device: Rolling walker (2 wheeled);None Gait Pattern/deviations: Step-through pattern;Shuffle;Decreased stride length;Trunk flexed Gait velocity: decreased Gait velocity  interpretation: <1.8 ft/sec, indicate of risk for recurrent falls General Gait Details: Pt with hesitant, shuffling gait pattern. Improved gait speed and step length with use of walker  Stairs            Wheelchair Mobility    Modified Rankin (Stroke Patients Only)       Balance Overall balance assessment: Needs assistance Sitting-balance support: Feet supported Sitting balance-Leahy Scale: Good     Standing balance support: No upper extremity supported;During functional activity Standing balance-Leahy Scale: Fair                               Pertinent Vitals/Pain Pain Assessment: No/denies pain    Home Living Family/patient expects to be discharged to:: Private residence Living Arrangements: Other relatives(brother) Available Help at Discharge: Family Type of Home: Mobile home Home Access: Stairs to enter   Entrance Stairs-Number of Steps: 4 Home Layout: One level Home Equipment: None      Prior Function Level of Independence: Independent               Hand Dominance        Extremity/Trunk Assessment   Upper Extremity Assessment Upper Extremity Assessment: Generalized weakness    Lower Extremity Assessment Lower Extremity Assessment: Generalized weakness    Cervical / Trunk Assessment Cervical / Trunk Assessment: Other exceptions Cervical / Trunk Exceptions: cachetic  Communication   Communication: Other (comment)(difficult to understand, soft spoken)  Cognition Arousal/Alertness: Awake/alert Behavior During Therapy: Flat affect Overall Cognitive Status: Impaired/Different from baseline Area of Impairment: Orientation;Awareness;Following commands                 Orientation Level:  Disoriented to;Time;Situation     Following Commands: Follows multi-step commands inconsistently   Awareness: Intellectual   General Comments: Pt states it is September and he is in the hospital because of CA      General Comments       Exercises     Assessment/Plan    PT Assessment Patient needs continued PT services  PT Problem List Decreased strength;Decreased activity tolerance;Decreased balance;Decreased mobility       PT Treatment Interventions DME instruction;Gait training;Stair training;Functional mobility training;Therapeutic activities;Therapeutic exercise;Balance training;Patient/family education    PT Goals (Current goals can be found in the Care Plan section)  Acute Rehab PT Goals Patient Stated Goal: none stated; agreeable to work with therapy PT Goal Formulation: With patient Time For Goal Achievement: 10/30/18 Potential to Achieve Goals: Good    Frequency Min 3X/week   Barriers to discharge        Co-evaluation               AM-PAC PT "6 Clicks" Mobility  Outcome Measure Help needed turning from your back to your side while in a flat bed without using bedrails?: None Help needed moving from lying on your back to sitting on the side of a flat bed without using bedrails?: A Little Help needed moving to and from a bed to a chair (including a wheelchair)?: A Little Help needed standing up from a chair using your arms (e.g., wheelchair or bedside chair)?: A Little Help needed to walk in hospital room?: A Little Help needed climbing 3-5 steps with a railing? : A Lot 6 Click Score: 18    End of Session   Activity Tolerance: Patient tolerated treatment well Patient left: in chair;with call bell/phone within reach;with chair alarm set Nurse Communication: Mobility status PT Visit Diagnosis: Unsteadiness on feet (R26.81);Muscle weakness (generalized) (M62.81)    Time: 2197-5883 PT Time Calculation (min) (ACUTE ONLY): 22 min   Charges:   PT Evaluation $PT Eval Moderate Complexity: 1 Mod          Ellamae Sia, [place], DPT Acute Rehabilitation Services Pager [phone] Office [phone]   Willy Eddy 10/16/2018, 3:52 PM

## 2018-10-16 NOTE — Progress Notes (Signed)
Palliative:  I met again today with Ryan Collins. He was more awake and sitting up in his bed working on his lunch. He is more oriented at this time and able to tell me that Dr. Burr Medico visited with him yesterday and told him his cancer is progressing. He also tells me that "I know this is incurable" but he is hoping for some more time. He is open to any treatments offered that could help him to have more time but knows there may not be any options at this stage. We were able to speak simply about this situation and his fears. He tells me that he is afraid of not being able to breathe when he nears the end of his life and suffering. He indicates that he would not want to be placed on machines/life support or have CPR but would rather focus on comfort and no suffering when his time comes.   I have read Dr. Ernestina Penna note and see that she has had conversation with mother and left voicemail for sister (who is a Marine scientist). I will not call mother again today.  Exam: Alert, more oriented today. No distress. Frail, cachectic.   Plan: - Palliative to continue to follow and discuss with Ryan Collins. Hopefully he will continue to be more alert and oriented to make his own decisions as this would take the burden off his mother who is struggling.   25 min  Vinie Sill, NP Palliative Medicine Team Pager # [phone] (M-F 8a-5p) Team Phone # [phone] (Nights/Weekends)

## 2018-10-16 NOTE — Progress Notes (Signed)
PROGRESS NOTE    Ryan Collins  XAJ:[id number] DOB: [birth year] DOA: 10/13/2018 PCP: Lanae Boast, [place] (Confirm with patient/family/NH records and if not entered, this HAS to be entered at [facility] point of entry. "No PCP" if truly none.)   Brief Narrative: 40y male brought in by EMS unresponsive.  He has a history of metastatic gastric cancer.  Apparently his had a continued decline over the past week and then today acutely worsened.  On EMS arrival he was very poorly responsive.  They question a possible left-sided gaze.  He has been a code stroke because of this.  Arrival to the emergency room he had a GCS of 3.  He was intubated for airway protection.  When RSI meds are being pushed he did have some spontaneous movement of his right lower extremity and did shake his head a little bit. Admitted to ICU with ? New onset seizure with notable brain mets on imaging. Neurology recommending AED with keppra and now awake alert and back to baseline mental status.  Update 10/16/2018: Lengthy discussion with patient's 2 sisters about patient's prognosis and recommendations for DNR status as well as further discussion with palliative care/hospice.  Fortunately we were disconnected and family no longer answering their phone.  Attempted to call patient's mother as well with no answer.  At this point patient pending PT evaluation for possible disposition, has a very poor prognosis at this point given he has already failed multiple treatments per Heme/Onc note and there was discussion of radiation although I discussed this with the patient and he understands these treatments would not likely be curative. Will attempt to contact family again here shortly to attempt to finalize plans - both short term disposition as well as long term goals of care. Patient continues to be full code despite agreeing intially to DNR/DNI status with heme-onc last night, apparently family wanted to keep full code status until further discussions  could be had.  Assessment & Plan:   Active Problems:   Respiratory failure ([place])   Brain metastases ([place])   Glasgow coma scale total score 3-8 ([place])   Palliative care by specialist  Acute metabolic encephalopathy. Questionable seizure with postictal state, resolved  In the setting of known cerebral metastasis  Neurology following - defer to their expertise for antiepileptic medication titrations No clear seizure at this point  Continuing IV Decadron  Serial neuro checks   Acute hypoxic respiratory failure in the setting of ineffective airway protection secondary to above Extubated 10/14/2018 successfully, wean oxygen as tolerated  Widely metastatic gastric carcinoma with metastasis to brain throughout the abdomen and also thorax, now with progressive failure to thrive -Currently on Keytruda -Already undergone extensive chemotherapy and surgery -Mets to brain and abdomen indicate very poor prognosis - continue to discuss grim outlook with family.  Palliative care consulted, unclear if patient willing to discuss with hospice at this point -may need family discussion in the next 24 hours pending clinical course  Anion gap metabolic acidosis with resolving lactic acidosis following seizure Continue IV fluids until p.o. intake increases adequately  Anemia of chronic disease without evidence of hemorrhage Follow morning labs, no clear etiology for blood loss  Protein calorie malnutrition in setting of malignancy Nutritional consult following  DVT prophylaxis: SCDs only Code Status: Full Disposition Plan: Patient continues in the inpatient setting given poor p.o. intake, failure to thrive, tori dysfunction with metastatic disease as above.  Patient currently being evaluated by palliative care versus hospice pending further discussion with family  patient has continued to worsen despite aggressive treatment as above.  Subjective: No acute issues or events overnight, his chest pain,  shortness of breath, nausea, vomiting, diarrhea, constipation, headache, fever, chills.  Objective: Vitals:   10/16/18 1100 10/16/18 1159 10/16/18 1200 10/16/18 1300  BP: 108/85  119/87 (!) 122/95  Pulse: 83  83 78  Resp: 12  14 14   Temp:  98.2 F (36.8 C)    TempSrc:  Axillary    SpO2: 97%  98% 100%  Weight:      Height:        Intake/Output Summary (Last 24 hours) at 10/16/2018 1356 Last data filed at 10/16/2018 1300 Gross per 24 hour  Intake 2428.07 ml  Output 1975 ml  Net 453.07 ml   Filed Weights   10/14/18 0500 10/15/18 0352 10/16/18 0304  Weight: 49 kg 46.8 kg 46.5 kg    Examination:  General:  Pleasantly resting in bed, No acute distress.  Cachectic appearing older than stated age 58:  Normocephalic atraumatic.  Sclerae nonicteric, noninjected.  Extraocular movements intact bilaterally. Neck:  Without mass or deformity.  Trachea is midline. Lungs:  Clear to auscultate bilaterally without rhonchi, wheeze, or rales. Heart:  Regular rate and rhythm.  Without murmurs, rubs, or gallops. Abdomen:  Soft, nontender, nondistended.  Without guarding or rebound. Extremities: Without cyanosis, clubbing, edema, or obvious deformity. Vascular:  Dorsalis pedis and posterior tibial pulses palpable bilaterally. Skin:  Warm and dry, no erythema, no ulcerations.  Data Reviewed: I have personally reviewed following labs and imaging studies  CBC: Recent Labs  Lab 10/13/18 1921 10/13/18 1950 10/13/18 2042 10/14/18 0219 10/15/18 0146 10/16/18 0232  WBC 4.7  --   --  6.3 11.3* 10.8*  NEUTROABS 3.2  --   --   --   --   --   HGB 12.1* 13.6 10.9* 12.6* 12.7* 11.4*  HCT 37.0* 40.0 32.0* 35.7* 36.3* 33.6*  MCV 88.9  --   --  82.8 83.6 85.9  PLT 245  --   --  296 244 562   Basic Metabolic Panel: Recent Labs  Lab 10/13/18 1921 10/13/18 1950 10/13/18 2042 10/14/18 0219 10/15/18 0146 10/16/18 0232  NA 137 134* 137 140 148* 147*  K 4.4 4.0 4.2 4.8 4.9 6.0*  CL 106 108  --   111 121* 119*  CO2 13*  --   --  16* 19* 18*  GLUCOSE 64* 62*  --  125* 111* 103*  BUN 19 20  --  18 12 11   CREATININE 1.12 0.90  --  0.93 0.70 0.62  CALCIUM 8.7*  --   --  8.2* 8.4* 8.5*  MG 2.5*  --   --   --   --   --    GFR: Estimated Creatinine Clearance: 67 mL/min (by C-G formula based on SCr of 0.62 mg/dL). Liver Function Tests: Recent Labs  Lab 10/13/18 1921  AST 33  ALT QUANTITY NOT SUFFICIENT, UNABLE TO PERFORM TEST  ALKPHOS 53  BILITOT 0.8  PROT 5.1*  ALBUMIN 2.2*   No results for input(s): LIPASE, AMYLASE in the last 168 hours. Recent Labs  Lab 10/13/18 1930  AMMONIA 80*   Coagulation Profile: Recent Labs  Lab 10/13/18 1921  INR 1.3*   Cardiac Enzymes: Recent Labs  Lab 10/13/18 1921  TROPONINI <0.03   BNP (last 3 results) No results for input(s): PROBNP in the last 8760 hours. HbA1C: No results for input(s): HGBA1C in the last 72 hours. CBG:  No results for input(s): GLUCAP in the last 168 hours. Lipid Profile: Recent Labs    10/14/18 0219  TRIG 81   Thyroid Function Tests: No results for input(s): TSH, T4TOTAL, FREET4, T3FREE, THYROIDAB in the last 72 hours. Anemia Panel: No results for input(s): VITAMINB12, FOLATE, FERRITIN, TIBC, IRON, RETICCTPCT in the last 72 hours. Sepsis Labs: Recent Labs  Lab 10/13/18 2005 10/13/18 2228  LATICACIDVEN 7.1* 3.8*    Recent Results (from the past 240 hour(s))  SARS Coronavirus 2 (CEPHEID - Performed in [place] hospital lab), Hosp Order     Status: None   Collection Time: 10/13/18  8:25 PM  Result Value Ref Range Status   SARS Coronavirus 2 NEGATIVE NEGATIVE Final    Comment: (NOTE) If result is NEGATIVE SARS-CoV-2 target nucleic acids are NOT DETECTED. The SARS-CoV-2 RNA is generally detectable in upper and lower  respiratory specimens during the acute phase of infection. The lowest  concentration of SARS-CoV-2 viral copies this assay can detect is 250  copies / mL. A negative result does  not preclude SARS-CoV-2 infection  and should not be used as the sole basis for treatment or other  patient management decisions.  A negative result may occur with  improper specimen collection / handling, submission of specimen other  than nasopharyngeal swab, presence of viral mutation(s) within the  areas targeted by this assay, and inadequate number of viral copies  (<250 copies / mL). A negative result must be combined with clinical  observations, patient history, and epidemiological information. If result is POSITIVE SARS-CoV-2 target nucleic acids are DETECTED. The SARS-CoV-2 RNA is generally detectable in upper and lower  respiratory specimens dur ing the acute phase of infection.  Positive  results are indicative of active infection with SARS-CoV-2.  Clinical  correlation with patient history and other diagnostic information is  necessary to determine patient infection status.  Positive results do  not rule out bacterial infection or co-infection with other viruses. If result is PRESUMPTIVE POSTIVE SARS-CoV-2 nucleic acids MAY BE PRESENT.   A presumptive positive result was obtained on the submitted specimen  and confirmed on repeat testing.  While 2019 novel coronavirus  (SARS-CoV-2) nucleic acids may be present in the submitted sample  additional confirmatory testing may be necessary for epidemiological  and / or clinical management purposes  to differentiate between  SARS-CoV-2 and other Sarbecovirus currently known to infect humans.  If clinically indicated additional testing with an alternate test  methodology [phone]) is advised. The SARS-CoV-2 RNA is generally  detectable in upper and lower respiratory sp ecimens during the acute  phase of infection. The expected result is Negative. Fact Sheet for Patients:  StrictlyIdeas.no Fact Sheet for Healthcare Providers: BankingDealers.co.za This test is not yet approved or  cleared by the [place] FDA and has been authorized for detection and/or diagnosis of SARS-CoV-2 by FDA under an Emergency Use Authorization (EUA).  This EUA will remain in effect (meaning this test can be used) for the duration of the COVID-19 declaration under Section 564(b)(1) of the Act, 21 U.S.C. section 360bbb-3(b)(1), unless the authorization is terminated or revoked sooner. Performed at [facility], [place] [address] [place], [place] [address]   MRSA PCR Screening     Status: None   Collection Time: 10/13/18  9:58 PM  Result Value Ref Range Status   MRSA by PCR NEGATIVE NEGATIVE Final    Comment:        The GeneXpert MRSA Assay (FDA approved for  NASAL specimens only), is one component of a comprehensive MRSA colonization surveillance program. It is not intended to diagnose MRSA infection nor to guide or monitor treatment for MRSA infections. Performed at [facility], [place] [address] [place], [place] [address]      Radiology Studies: No results found. Scheduled Meds:  chlorhexidine  15 mL Mouth Rinse BID   Chlorhexidine Gluconate Cloth  6 each Topical Daily   dexamethasone  4 mg Intravenous Q6H   mouth rinse  15 mL Mouth Rinse q12n4p   Continuous Infusions:  levETIRAcetam 400 mL/hr at 10/16/18 1300     LOS: 3 days   Time spent: 54min  Fredia Chittenden C Lunette Tapp, DO [facility] Pager [phone]  If 7PM-7AM, please contact night-coverage [email or web address] Password [email or web address] 10/16/2018, 1:56 PM

## 2018-10-16 NOTE — Progress Notes (Signed)
Nutrition Follow-up  DOCUMENTATION CODES:   Underweight, Severe malnutrition in context of chronic illness  INTERVENTION:   Support nutrition based on goals of care.   Magic cup TID with meals, each supplement provides 290 kcal and 9 grams of protein  NUTRITION DIAGNOSIS:   Severe Malnutrition related to chronic illness(metastatic cancer) as evidenced by energy intake < or equal to 75% for > or equal to 1 month, percent weight loss. Ongoing.   GOAL:   Patient will meet greater than or equal to 90% of their needs Progressing.   MONITOR:   Diet advancement, PO intake  REASON FOR ASSESSMENT:   Consult, Ventilator Enteral/tube feeding initiation and management  ASSESSMENT:   Pt with PMH of widely metastatic gastric carcinoma s/p gastrectomy and jejunostomy admitted 6/1 with respiratory distress s/p seizure.    6/2 extubated 6/3 diet advanced to regular  Per RN eating small amounts  Per MD palliative consulted for possible hospice as cancer continues to progress.    NUTRITION - FOCUSED PHYSICAL EXAM:  Deferred   Diet Order:   Diet Order            Diet regular Room service appropriate? Yes with Assist; Fluid consistency: Thin  Diet effective now              EDUCATION NEEDS:   No education needs have been identified at this time  Skin:  Skin Assessment: Reviewed RN Assessment  Last BM:  unknown  Height:   Ht Readings from Last 1 Encounters:  10/13/18 5\' 10"  (1.778 m)    Weight:   Wt Readings from Last 1 Encounters:  10/16/18 46.5 kg    Ideal Body Weight:  75.4 kg  BMI:  Body mass index is 14.71 kg/m.  Estimated Nutritional Needs:   Kcal:  1700-2000  Protein:  85-100 grams  Fluid:  > 1.7 L/day  Maylon Peppers RD, LDN, CNSC [phone] Pager [phone] After Hours Pager

## 2018-10-16 NOTE — Evaluation (Signed)
Occupational Therapy Evaluation Patient Details Name: Ryan Collins MRN: [id number] DOB: 09/30/1960 Today's Date: 10/16/2018    History of Present Illness Pt is a 58 y.o. M with significant PMH of metastatic gastric cancer, currently on third line Keytruda, with multiple brain metastasis who was admitted for AMS and seizure. Also noted to have severe protein and calorie malnutrition.     Clinical Impression   Pt admitted with above. He demonstrates the below listed deficits and will benefit from continued OT to maximize safety and independence with BADLs.  Pt presents to OT with impaired balance, generalized weakness, decreased activity tolerance, and cognitive deficits including perseveration, decreased processing, deficits with problem solving and awareness.  He lives with his brother and was mod I PTA.  Recommend 24 hour supervision/assist at discharge, and [place].       Follow Up Recommendations  Home health OT;Supervision/Assistance - 24 hour    Equipment Recommendations  Tub/shower bench    Recommendations for Other Services       Precautions / Restrictions Precautions Precautions: Fall Restrictions Weight Bearing Restrictions: No      Mobility Bed Mobility Overal bed mobility: Needs Assistance Bed Mobility: Supine to Sit     Supine to sit: Supervision     General bed mobility comments: Pt sitting up in recliner   Transfers Overall transfer level: Needs assistance Equipment used: None Transfers: Sit to/from Stand Sit to Stand: Min assist         General transfer comment: assist for balance     Balance Overall balance assessment: Needs assistance Sitting-balance support: Feet supported Sitting balance-Leahy Scale: Good     Standing balance support: No upper extremity supported;During functional activity Standing balance-Leahy Scale: Fair                             ADL either performed or assessed with clinical judgement   ADL Overall ADL's  : Needs assistance/impaired Eating/Feeding: Set up;Supervision/ safety;Sitting   Grooming: Wash/dry hands;Wash/dry face;Brushing hair;Set up;Supervision/safety;Sitting   Upper Body Bathing: Minimal assistance;Sitting   Lower Body Bathing: Minimal assistance;Sit to/from stand   Upper Body Dressing : Moderate assistance;Sitting   Lower Body Dressing: Minimal assistance;Sit to/from stand   Toilet Transfer: Minimal assistance;Ambulation;Comfort height toilet;Grab bars   Toileting- Clothing Manipulation and Hygiene: Minimal assistance;Sit to/from stand       Functional mobility during ADLs: Minimal assistance       Vision Baseline Vision/History: No visual deficits Patient Visual Report: No change from baseline Additional Comments: Will benefit from further assessment      Perception Perception Perception Tested?: Yes   Praxis Praxis Praxis tested?: Deficits Deficits: Perseveration    Pertinent Vitals/Pain Pain Assessment: No/denies pain     Hand Dominance Right   Extremity/Trunk Assessment Upper Extremity Assessment Upper Extremity Assessment: Generalized weakness   Lower Extremity Assessment Lower Extremity Assessment: Generalized weakness   Cervical / Trunk Assessment Cervical / Trunk Assessment: Other exceptions Cervical / Trunk Exceptions: cachetic   Communication Communication Communication: Other (comment)(difficult to understand, soft spoken)   Cognition Arousal/Alertness: Awake/alert Behavior During Therapy: Flat affect Overall Cognitive Status: Impaired/Different from baseline Area of Impairment: Orientation;Awareness;Following commands                 Orientation Level: Disoriented to;Time;Situation     Following Commands: Follows multi-step commands inconsistently   Awareness: Intellectual   General Comments: Pt is perseverative.  He initially states it's January, then states it's september.  He  is slow to respond    General Comments   VSS     Exercises     Shoulder Instructions      Home Living Family/patient expects to be discharged to:: Private residence Living Arrangements: Other relatives(brother) Available Help at Discharge: Family Type of Home: Mobile home Home Access: Stairs to enter Entrance Stairs-Number of Steps: 4   Home Layout: One level     Bathroom Shower/Tub: Chief Strategy Officer: None          Prior Functioning/Environment Level of Independence: Independent                 OT Problem List: Decreased strength;Decreased activity tolerance;Impaired balance (sitting and/or standing);Impaired vision/perception;Decreased cognition;Decreased safety awareness;Decreased knowledge of use of DME or AE      OT Treatment/Interventions: Self-care/ADL training;DME and/or AE instruction;Therapeutic activities;Cognitive remediation/compensation;Visual/perceptual remediation/compensation;Patient/family education;Balance training    OT Goals(Current goals can be found in the care plan section) Acute Rehab OT Goals Patient Stated Goal: Pt did not state  OT Goal Formulation: With patient/family Time For Goal Achievement: 10/30/18 Potential to Achieve Goals: Good  OT Frequency: Min 2X/week   Barriers to D/C:            Co-evaluation              AM-PAC OT "6 Clicks" Daily Activity     Outcome Measure Help from another person eating meals?: None Help from another person taking care of personal grooming?: A Little Help from another person toileting, which includes using toliet, bedpan, or urinal?: A Little Help from another person bathing (including washing, rinsing, drying)?: A Little Help from another person to put on and taking off regular upper body clothing?: A Little Help from another person to put on and taking off regular lower body clothing?: A Little 6 Click Score: 19   End of Session Nurse Communication: Mobility status  Activity Tolerance: Patient  limited by fatigue Patient left: in chair;with call bell/phone within reach;with chair alarm set  OT Visit Diagnosis: Unsteadiness on feet (R26.81);Cognitive communication deficit (R41.841)                Time: 3614-4315 OT Time Calculation (min): 18 min Charges:  OT General Charges $OT Visit: 1 Visit OT Evaluation $OT Eval Moderate Complexity: 1 Mod  Lucille Passy, OTR/L Acute Rehabilitation Services Pager [phone] Office [phone]   Lucille Passy M 10/16/2018, 4:50 PM

## 2018-10-16 NOTE — Progress Notes (Signed)
Pt admitted to the unit as a transfer from 4N. Pt alert and verbally responsive; telemetry applied and verified with CCMD; RN Estill Bamberg second verified; VSS: skin assessment completed per protocol; no opened wounds or pressure ulcers noted. Pt oriented to the unit and room; fall/safety precaution and prevention education completed. Call light within reach; bed alarm on. Will continue to closely monitor. Delia Heady RN   10/16/18 2135  Vitals  Temp 98.5 F (36.9 C)  Temp Source Oral  BP (!) 114/91  MAP (mmHg) 99  BP Method Automatic  Pulse Rate 87  Pulse Rate Source Monitor  Resp 16  Oxygen Therapy  SpO2 100 %  O2 Device Room Air  MEWS Score  MEWS RR 0  MEWS Pulse 0  MEWS Systolic 0  MEWS LOC 0  MEWS Temp 0  MEWS Score 0  MEWS Score Color Green

## 2018-10-16 NOTE — Progress Notes (Signed)
Assisted tele visit to patient with sister, Denman George.  Maryelizabeth Rowan, RN

## 2018-10-17 ENCOUNTER — Inpatient Hospital Stay (HOSPITAL_COMMUNITY): Payer: Medicaid Other

## 2018-10-17 DIAGNOSIS — R402432 Glasgow coma scale score 3-8, at arrival to emergency department: Secondary | ICD-10-CM

## 2018-10-17 DIAGNOSIS — G9349 Other encephalopathy: Secondary | ICD-10-CM

## 2018-10-17 LAB — BASIC METABOLIC PANEL
Anion gap: 11 (ref 5–15)
BUN: 10 mg/dL (ref 6–20)
CO2: 22 mmol/L (ref 22–32)
Calcium: 8.7 mg/dL — ABNORMAL LOW (ref 8.9–10.3)
Chloride: 112 mmol/L — ABNORMAL HIGH (ref 98–111)
Creatinine, Ser: 0.58 mg/dL — ABNORMAL LOW (ref 0.61–1.24)
GFR calc Af Amer: >60 mL/min (ref >60)
GFR calc non Af Amer: >60 mL/min (ref >60)
Glucose, Bld: 120 mg/dL — ABNORMAL HIGH (ref 70–99)
Potassium: 3.8 mmol/L (ref 3.5–5.1)
Sodium: 145 mmol/L (ref 135–145)

## 2018-10-17 LAB — GLUCOSE, CAPILLARY: Glucose-Capillary: 106 mg/dL — ABNORMAL HIGH (ref 70–99)

## 2018-10-17 LAB — CBC
HCT: 32.3 % — ABNORMAL LOW (ref 39.0–52.0)
Hemoglobin: 11.3 g/dL — ABNORMAL LOW (ref 13.0–17.0)
MCH: 29.6 pg (ref 26.0–34.0)
MCHC: 35 g/dL (ref 30.0–36.0)
MCV: 84.6 fL (ref 80.0–100.0)
Platelets: 242 10*3/uL (ref 150–400)
RBC: 3.82 MIL/uL — ABNORMAL LOW (ref 4.22–5.81)
RDW: 21.9 % — ABNORMAL HIGH (ref 11.5–15.5)
WBC: 11.9 10*3/uL — ABNORMAL HIGH (ref 4.0–10.5)
nRBC: 0.8 % — ABNORMAL HIGH (ref 0.0–0.2)

## 2018-10-17 MED ORDER — GADOBUTROL 1 MMOL/ML IV SOLN
5.0000 mL | Freq: Once | INTRAVENOUS | Status: AC | PRN
  Administered 2018-10-17: 5 mL via INTRAVENOUS

## 2018-10-17 MED ORDER — DEXAMETHASONE 4 MG PO TABS: 4.0000 mg | ORAL_TABLET | Freq: Four times a day (QID) | ORAL | 0 refills | Status: AC

## 2018-10-17 MED ORDER — LEVETIRACETAM 1000 MG PO TABS: 1000.0000 mg | ORAL_TABLET | Freq: Two times a day (BID) | ORAL | 0 refills | Status: AC

## 2018-10-17 NOTE — TOC Progression Note (Signed)
Transition of Care [facility]) - Progression Note    Patient Details  Name: Ryan Collins MRN: [id number] Date of Birth: 1960/07/15  Transition of Care [facility]) CM/SW Contact  Zenon Mayo, RN Phone Number: 10/17/2018, 4:54 PM  Clinical Narrative:    From home alone, has gastric ca, mets to brain, oncology following.  NCM spoke with patient regarding [place] services , he states to call his mother or sister  Denman George.  NCM spoke with mother, she states that she can not take care of patient , she is 57 years old.  She states he needs hospital bed, bsc and rolling walker.  NCM informed her that this can be ordered for him , which was ordered thru [employer] with [facility]ack, mom is contact to coordinate bed delivery. He will need HHRN, and SW.  NCM has tried several agencies, [facility], [facility], [facility], [facility], [facility]. They were not able to take patient.  NCM contacted [facility], they were able to take patient and will not be able to soc til next Wed or Thursday.  This information was given to patient mother, she states she will be at his home and her other son will be there sometime to help also.  A referral was made for palliative services to [facility] in [place].  Patient will need ambulance transport home , address confirmed with mother. Please call her to make sure DME has been delivered before patient is discharged.     Expected Discharge Plan: [place] Barriers to Discharge: No Barriers Identified  Expected Discharge Plan and Services Expected Discharge Plan: [place] In-house Referral: Clinical Social Work Discharge Planning Services: CM Consult Post Acute Care Choice: [place] arrangements for the past 2 months: Single Family Home Expected Discharge Date: 10/17/18               DME Arranged: Bedside commode, Walker rolling, Hospital bed DME Agency: [facility] Date DME Agency Contacted: 10/17/18 Time DME Agency Contacted:  [phone] Representative spoke with at DME Agency: zack HH Arranged: RN, PT [place] Date [place]: 10/17/18 Time [place]: 1653 Representative spoke with at [place]: [employer]) Interventions    Readmission Risk Interventions No flowsheet data found.

## 2018-10-17 NOTE — Progress Notes (Signed)
Ryan Collins   DOB:05/04/1961   FV#:[id number]   FMB#:[id number]  Oncology follow up  Subjective: Patient is still very weak, he is able to walk to bathroom and hallway with a walker and put some assistance.  He has not been out of bed much.  Appetite is low, but does eat some of his meals. Denies significant pain or other new complains.    Objective:  Vitals:   10/17/18 0757 10/17/18 1127  BP: (!) 133/103 114/79  Pulse: 90 79  Resp: 18 18  Temp: 98 F (36.7 C) 98 F (36.7 C)  SpO2: 100% 100%    Body mass index is 16.1 kg/m.  Intake/Output Summary (Last 24 hours) at 10/17/2018 2010 Last data filed at 10/17/2018 1301 Gross per 24 hour  Intake 2960 ml  Output 3150 ml  Net -190 ml     Sclerae unicteric  Lungs clear -- no rales or rhonchi  Heart regular rate and rhythm  Abdomen benign soft    CBG (last 3)  No results for input(s): GLUCAP in the last 72 hours.   Labs:  Urine Studies No results for input(s): UHGB, CRYS in the last 72 hours.  Invalid input(s): UACOL, UAPR, USPG, UPH, UTP, UGL, UKET, UBIL, UNIT, UROB, ULEU, UEPI, UWBC, [place], [place], [place], [place], [place]  Basic Metabolic Panel: Recent Labs  Lab 10/13/18 1921  10/14/18 0219 10/15/18 0146 10/16/18 0232 10/16/18 1608 10/17/18 0329  NA 137   < > 140 148* 147* 149* 145  K 4.4   < > 4.8 4.9 6.0* 4.0 3.8  CL 106   < > 111 121* 119* 119* 112*  CO2 13*  --  16* 19* 18* 20* 22  GLUCOSE 64*   < > 125* 111* 103* 94 120*  BUN 19   < > 18 12 11 11 10   CREATININE 1.12   < > 0.93 0.70 0.62 0.68 0.58*  CALCIUM 8.7*  --  8.2* 8.4* 8.5* 8.8* 8.7*  MG 2.5*  --   --   --   --   --   --    < > = values in this interval not displayed.   GFR Estimated Creatinine Clearance: 73.3 mL/min (A) (by C-G formula based on SCr of 0.58 mg/dL (L)). Liver Function Tests: Recent Labs  Lab 10/13/18 1921  AST 33  ALT QUANTITY NOT SUFFICIENT, UNABLE TO PERFORM TEST  ALKPHOS 53  BILITOT 0.8  PROT 5.1*  ALBUMIN 2.2*   No results for  input(s): LIPASE, AMYLASE in the last 168 hours. Recent Labs  Lab 10/13/18 1930  AMMONIA 80*   Coagulation profile Recent Labs  Lab 10/13/18 1921  INR 1.3*    CBC: Recent Labs  Lab 10/13/18 1921  10/13/18 2042 10/14/18 0219 10/15/18 0146 10/16/18 0232 10/17/18 0329  WBC 4.7  --   --  6.3 11.3* 10.8* 11.9*  NEUTROABS 3.2  --   --   --   --   --   --   HGB 12.1*   < > 10.9* 12.6* 12.7* 11.4* 11.3*  HCT 37.0*   < > 32.0* 35.7* 36.3* 33.6* 32.3*  MCV 88.9  --   --  82.8 83.6 85.9 84.6  PLT 245  --   --  296 244 252 242   < > = values in this interval not displayed.   Cardiac Enzymes: Recent Labs  Lab 10/13/18 1921  TROPONINI <0.03   BNP: Invalid input(s): POCBNP CBG: No results for input(s): GLUCAP in  the last 168 hours. D-Dimer No results for input(s): DDIMER in the last 72 hours. Hgb A1c No results for input(s): HGBA1C in the last 72 hours. Lipid Profile No results for input(s): CHOL, HDL, LDLCALC, TRIG, CHOLHDL, LDLDIRECT in the last 72 hours. Thyroid function studies No results for input(s): TSH, T4TOTAL, T3FREE, THYROIDAB in the last 72 hours.  Invalid input(s): FREET3 Anemia work up No results for input(s): VITAMINB12, FOLATE, FERRITIN, TIBC, IRON, RETICCTPCT in the last 72 hours. Microbiology Recent Results (from the past 240 hour(s))  SARS Coronavirus 2 (CEPHEID - Performed in [facility] hospital lab), Hosp Order     Status: None   Collection Time: 10/13/18  8:25 PM  Result Value Ref Range Status   SARS Coronavirus 2 NEGATIVE NEGATIVE Final    Comment: (NOTE) If result is NEGATIVE SARS-CoV-2 target nucleic acids are NOT DETECTED. The SARS-CoV-2 RNA is generally detectable in upper and lower  respiratory specimens during the acute phase of infection. The lowest  concentration of SARS-CoV-2 viral copies this assay can detect is 250  copies / mL. A negative result does not preclude SARS-CoV-2 infection  and should not be used as the sole basis for  treatment or other  patient management decisions.  A negative result may occur with  improper specimen collection / handling, submission of specimen other  than nasopharyngeal swab, presence of viral mutation(s) within the  areas targeted by this assay, and inadequate number of viral copies  (<250 copies / mL). A negative result must be combined with clinical  observations, patient history, and epidemiological information. If result is POSITIVE SARS-CoV-2 target nucleic acids are DETECTED. The SARS-CoV-2 RNA is generally detectable in upper and lower  respiratory specimens dur ing the acute phase of infection.  Positive  results are indicative of active infection with SARS-CoV-2.  Clinical  correlation with patient history and other diagnostic information is  necessary to determine patient infection status.  Positive results do  not rule out bacterial infection or co-infection with other viruses. If result is PRESUMPTIVE POSTIVE SARS-CoV-2 nucleic acids MAY BE PRESENT.   A presumptive positive result was obtained on the submitted specimen  and confirmed on repeat testing.  While 2019 novel coronavirus  (SARS-CoV-2) nucleic acids may be present in the submitted sample  additional confirmatory testing may be necessary for epidemiological  and / or clinical management purposes  to differentiate between  SARS-CoV-2 and other Sarbecovirus currently known to infect humans.  If clinically indicated additional testing with an alternate test  methodology [id number]) is advised. The SARS-CoV-2 RNA is generally  detectable in upper and lower respiratory sp ecimens during the acute  phase of infection. The expected result is Negative. Fact Sheet for Patients:  StrictlyIdeas.no Fact Sheet for Healthcare Providers: BankingDealers.co.za This test is not yet approved or cleared by the [place] FDA and has been authorized for detection and/or  diagnosis of SARS-CoV-2 by FDA under an Emergency Use Authorization (EUA).  This EUA will remain in effect (meaning this test can be used) for the duration of the COVID-19 declaration under Section 564(b)(1) of the Act, 21 U.S.C. section 360bbb-3(b)(1), unless the authorization is terminated or revoked sooner. Performed at [facility], [place] [address], [place], [place] [address]   MRSA PCR Screening     Status: None   Collection Time: 10/13/18  9:58 PM  Result Value Ref Range Status   MRSA by PCR NEGATIVE NEGATIVE Final    Comment:  The GeneXpert MRSA Assay (FDA approved for NASAL specimens only), is one component of a comprehensive MRSA colonization surveillance program. It is not intended to diagnose MRSA infection nor to guide or monitor treatment for MRSA infections. Performed at [facility], [place] [address], [place], [place] [address]       Studies:  No results found.  Assessment: 59 y.o. with metastatic gastric cancer, admitted for AMS and seizure  1.  Acute encephalopathy, with probable seizure, secondary to #2 2. Multiple brain metastasis with hemorrhage  3.  Metastatic gastric cancer, with recent disease progression, currently on third line Keytruda 4. Anemia of chronic disease 5.  Severe protein and calorie malnutrition  Plan:  -Pt is clinically stable, discharge planning has been discussed with patient and his mother by primary team multiple times.  Patient and his mother would like him to go home, however both of them are concerned he is not able to function independently at home, and he needs 24/7 care.  His mother will be available, but she can not assist pt much physically, she is 58 yo and uses a walker. However they do not want him to go to rehab. After a lengthy discussion with pt and his mother, and I communicated with Dr. Avon Gully also, pt and his mother decided to go home with an ambulance, probably tomorrow, hospital bed, walker,  bedside commode, etc. has been arranged. His brothers may take turns to help him at home.  -Patient and his mother are not ready for hospice, they agreed with palliative care home service. -Patient again expressed his wishes to pursue more cancer treatment if possible.  I have spoke with radiation oncologist Dr. Lisbeth Renshaw, and will arrange his brain radiation, probably SBRT next week in our cancer center. I will order brain MRI w wo contrast SRS protocol, and hopefully to be done before discharge to prepare his brain radiation.  -I will f/u him in my clinic in 1-2 weeks   [name], MD 10/17/2018

## 2018-10-17 NOTE — Progress Notes (Signed)
Palliative: Mr. Baylock is lying quietly in bed.  He will briefly make but not keep eye contact.  He appears chronically ill and frail.  There is no family at bedside at this time.  Palliative team at bedside as psychosocial support.  Mr. Barto denies needs or questions at this time.  We briefly talk about his video visit with his sister last night.  We also talk about at home palliative (not hospice, dt desire for radiation treatment) services.  At this point Mr. Wik is unable to say yes to at home palliative support at this time.  No call to family at this time.   Chart review indicates that multiple providers, including attending hospitalist and oncologist  have spoken with Mr. Mullane and his family about the treatment plan, prognosis and code status.   Conference with SW related to patient condition, needs and disposition.   Plan: Recommendations are for home with OT services and walker. Unable to agree to palliative out patient support. PMT to continue to follow.     81 minutes  Quinn Axe, NP Palliative Medicine Team Team Phone # [phone] Greater than 50% of this time was spent counseling and coordinating care related to the above assessment and plan.

## 2018-10-17 NOTE — Progress Notes (Signed)
CSW spoke with patient, informed him that his mother is concerned about being able to care for him and she would like him to go to a SNF. Patient said he had already talked to his mom and that he was going home with her. CSW explained that she had talked with RNCM and indicated otherwise. Patient shook his head, said he just wants to go home. CSW asked him who he would go home with if his mom said no, because he needed someone to be there to help him. Patient said he wanted to go home with his mom, and he wasn't doing anything else. Patient said he would talk to her, and CSW indicated that she would tell the rest of the team of his wishes.  CSW alerted MD and RNCM.  Laveda Abbe, [place] Clinical Social Worker [phone]

## 2018-10-17 NOTE — Progress Notes (Signed)
  Speech Language Pathology Treatment: Dysphagia  Patient Details Name: Ryan Collins MRN: [id number] DOB: 1960/08/28 Today's Date: 10/17/2018 Time: 6384-5364 SLP Time Calculation (min) (ACUTE ONLY): 11 min  Assessment / Plan / Recommendation Clinical Impression  Pt seen for limited swallow treatment d/t pt refusal with PO intake with exception of thin liquids via straw/puree consistency with multiple swallows noted during consumption.  Vocal quality improved since BSE completion with wet vocal quality not observed this session. Pt refused solids during this session, but chart review during BSE revealed probable esophageal dysphagia with preference of soft consistencies. ST will continue efforts with diet tolerance and education re: esophageal precautions; Regular/thin liquids recommended as tolerated.  HPI HPI: Ryan Collins is a 58 y.o. male with a history of gastric cancer with metastatic disease to the lungs who presents with unresponsiveness. Intubated 6/1-6/2. Per Palliative care note pt has lost weight. Had feeding tube but removed in Dec 2019. CXR Stable pulmonary metastasis. Other PMH: HTN, GI bleed, DVT.      SLP Plan  Continue with current plan of care       Recommendations  Diet recommendations: Regular;Thin liquid Liquids provided via: Cup;Straw Medication Administration: Crushed with puree Supervision: Patient able to self feed;Intermittent supervision to cue for compensatory strategies Compensations: Slow rate;Minimize environmental distractions;Small sips/bites Postural Changes and/or Swallow Maneuvers: Seated upright 90 degrees;Upright 30-60 min after meal                Oral Care Recommendations: Oral care BID Follow up Recommendations: Other (comment)(TBD) SLP Visit Diagnosis: Dysphagia, unspecified (R13.10) Plan: Continue with current plan of care                       Elvina Sidle, M.S., CCC-SLP 10/17/2018, 1:56 PM

## 2018-10-17 NOTE — Progress Notes (Signed)
Hydrologist [facility])  Hospital Liaison: RN note   Notified by Lunette Stands, CMRN of patient/family request for [facility] Palliative  services at home after discharge.      Writer spoke with patient's mother, Meryl Crutch. She asked that [facility] call patient's sister tomorrow to schedule appointment and further explain services. [place] liaison and palliative team will follow up to set up services after discharge from hospital.     Please call with any palliative related questions.     Thank you for this referral.     Farrel Gordon, RN, CCM  [place] (listed on [facility] under Hospice and [place] [facility] [place])  [phone]

## 2018-10-17 NOTE — Progress Notes (Signed)
Patient has Gastric Cancer, mets to brain which requires trunk and head to be positioned in ways not feasible with a normal bed.  Head must be elevated at 30 degrees or more or aspiration risk. Gastric Cancer requires frequent and immediate changes in body position which cannot be achieved with a normal bed.

## 2018-10-17 NOTE — Progress Notes (Signed)
Physical Therapy Treatment Patient Details Name: Ryan Collins MRN: [id number] DOB: 22-Apr-1961 Today's Date: 10/17/2018    History of Present Illness Pt is a 58 y.o. M with significant PMH of metastatic gastric cancer, currently on third line Keytruda, with multiple brain metastasis who was admitted for AMS and seizure. Also noted to have severe protein and calorie malnutrition.      PT Comments    Pt agreeable to PT however tolerance for functional activity remains low. Pt with decreased awareness of safety and deficits, and required urgent seated rest break due to fatigue and knee buckling. Pt reports he does not feel ready to return home just yet. If pt does d/c prior to next PT session, recommend 24 hour assist, max home health services, and may want to consider ambulance transfer home, as pt does have stairs to enter. Will continue to follow.    Follow Up Recommendations  Home health PT;Supervision/Assistance - 24 hour     Equipment Recommendations  Rolling walker with 5" wheels    Recommendations for Other Services       Precautions / Restrictions Precautions Precautions: Fall Restrictions Weight Bearing Restrictions: No    Mobility  Bed Mobility Overal bed mobility: Needs Assistance Bed Mobility: Supine to Sit     Supine to sit: Supervision     General bed mobility comments: Increased time and effort to transition to EOB.   Transfers Overall transfer level: Needs assistance Equipment used: None Transfers: Sit to/from Stand Sit to Stand: Min assist         General transfer comment: assist for balance during power up to full stand initially. At end of session pt required +2 assist to power-up from chair due to fatigue.   Ambulation/Gait Ambulation/Gait assistance: Min assist;Mod assist Gait Distance (Feet): 75 Feet Assistive device: Rolling walker (2 wheeled) Gait Pattern/deviations: Step-through pattern;Shuffle;Decreased stride length;Trunk flexed Gait  velocity: decreased Gait velocity interpretation: <1.31 ft/sec, indicative of household ambulator General Gait Details: Noted knee buckling this session with up to mod assist required to recover. Pt required urgent seated rest break and RN brought up chair behind him to sit. Decreased awareness of safety and deficits, insisting he could make it back to the room as he was descending down to urgently sit.    Stairs             Wheelchair Mobility    Modified Rankin (Stroke Patients Only)       Balance Overall balance assessment: Needs assistance Sitting-balance support: Feet supported Sitting balance-Leahy Scale: Good     Standing balance support: No upper extremity supported;During functional activity Standing balance-Leahy Scale: Poor Standing balance comment: Hands-on assist required at all times.                             Cognition Arousal/Alertness: Awake/alert Behavior During Therapy: Flat affect Overall Cognitive Status: Impaired/Different from baseline Area of Impairment: Orientation;Awareness;Following commands                 Orientation Level: Disoriented to;Time;Situation     Following Commands: Follows multi-step commands inconsistently   Awareness: Intellectual   General Comments: Slow to respond. Pt sometimes does not answer questions at all.       Exercises      General Comments        Pertinent Vitals/Pain Pain Assessment: No/denies pain Pain Intervention(s): Monitored during session    Home Living  Prior Function            PT Goals (current goals can now be found in the care plan section) Acute Rehab PT Goals Patient Stated Goal: Be able to get home (states he does not feel comfortable today) PT Goal Formulation: With patient Time For Goal Achievement: 10/30/18 Potential to Achieve Goals: Good Progress towards PT goals: Progressing toward goals    Frequency    Min  3X/week      PT Plan Current plan remains appropriate    Co-evaluation              AM-PAC PT "6 Clicks" Mobility   Outcome Measure  Help needed turning from your back to your side while in a flat bed without using bedrails?: None Help needed moving from lying on your back to sitting on the side of a flat bed without using bedrails?: A Little Help needed moving to and from a bed to a chair (including a wheelchair)?: A Little Help needed standing up from a chair using your arms (e.g., wheelchair or bedside chair)?: A Little Help needed to walk in hospital room?: A Little Help needed climbing 3-5 steps with a railing? : A Lot 6 Click Score: 18    End of Session Equipment Utilized During Treatment: Gait belt Activity Tolerance: Patient tolerated treatment well Patient left: in chair;with call bell/phone within reach;with chair alarm set Nurse Communication: Mobility status PT Visit Diagnosis: Unsteadiness on feet (R26.81);Muscle weakness (generalized) (M62.81)     Time: 8242-3536 PT Time Calculation (min) (ACUTE ONLY): 31 min  Charges:  $Gait Training: 23-37 mins                     Rolinda Roan, PT, DPT Acute Rehabilitation Services Pager: [phone] Office: [phone]    Thelma Comp 10/17/2018, 1:19 PM

## 2018-10-17 NOTE — Discharge Summary (Addendum)
Physician Discharge Summary  Ryan Collins BSW:[id number] DOB: 1960-08-10 DOA: 10/13/2018  PCP: Lanae Boast, FNP  Admit date: 10/13/2018 Discharge date: 10/17/2018  Admitted From: Home Disposition: Home, with home health   Recommendations for Outpatient Follow-up:  1. Follow up with PCP/oncology in 1-2 weeks 2. Patient will need close follow-up with palliative care as well per discussion with patient's mother requesting this at discharge  Home Health: Yes Equipment/Devices: Walker  Discharge Condition: Stable CODE STATUS: Full Diet recommendation: As tolerated  Brief/Interim Summary: 58ym[age] brought in by EMS unresponsive. He has a history of metastatic gastric cancer. Apparently his had a continued decline over the past week and then today acutely worsened. On EMS arrival he was very poorly responsive. They question a possible left-sided gaze. He has been a code stroke because of this. Arrival to the emergency room he had a GCS of 3. He was intubated for airway protection. When RSI meds are being pushed he did have some spontaneous movement of his right lower extremity and did shake his head a little bit. Admitted to ICU with ? New onset seizure with notable brain mets on imaging. Neurology recommending AED with keppra and now awake alert and back to baseline mental status.  Patient admitted as above altered mental status concerning for seizure-like activity and findings of metastatic disease to the brain this remains the most likely cause.  Patient initially had a GCS of 3 intubated sent to the ICU, subsequently extubated, neurology following previously recommending Keppra and dexamethasone for metastatic disease.  Lengthy discussion with patient's 2 sisters previously about patient's prognosis and recommendations for DNR status as well as further discussion with palliative care/hospice. Unfortunately we were disconnected and family no longer answering their phone.  Attempted to call  patient's mother multiple times as well with no answer.  At this point patient has been evaluated by PT recommending home health with physical therapy, patient continues to be somewhat clear on his wishes for future care, will need close follow-up with PCP as well as heme-onc in the next 1 week for further discussion and evaluation for possible met with palliative care and hospice.  Patient discussion with heme-onc, was initially made DNR/DNI however was reversed back to full code later that evening.  This time continue supportive care, will need further hydration and possible imaging pending work-up. Will discharge on Decadron as well as Keppra per neurology recommendations previously.  **UPDATE LATE 10/17/18 - lengthy discussion with patient and mother today who was finally available for phone conference. Patient is notably weak but given his cancer diagnosis this will likely not improve in any meaningful way. Patient was cleared for DC home with HH/PT as he was continuously refusing SNF placement. Patient was to be discharged home as agreed upon previously. Mother refusing acceptance as she does not feel she can handle his weakened state. She stated that with the right equipment she would agree (walker, hospital bed). This was to be setup; however, the mother then rescinded her offer and requested SNF evaluation - which the patient continued to refuse. Dr Burr Medico was further involved in discussion about further treatment vs palliative care/hospice discussion. Patient was ultimately agreeable for placement at SNF and in the interim will get MRI head tonight/early AM to help rads/Onc set up for radiation as per Heme/Onc note. Patient continues to be stable for discharge but this will not require SNF approval and bed offer.  Discharge Diagnoses:  Active Problems:   Respiratory failure ([place])   Brain metastases ([place])  Glasgow coma scale total score 3-8 ([place])   Palliative care by specialist  Acute metabolic  encephalopathy. Questionable seizure with postictal state, resolved  In the setting of known cerebral metastasis Follow with neurology, heme-onc, PCP scheduled Continue Decadron and Keppra  Acute hypoxic respiratory failure in the setting of ineffective airway protection secondary to above resolved Extubated 10/14/2018 successfully, weaned, currently on room air  Widely metastatic gastric carcinoma with metastasis to brain throughout the abdomen and also thorax, now with progressive failure to thrive -Currently on Keytruda -Already undergone extensive chemotherapy and surgery -Mets to brain and abdomen indicate very poor prognosis - continue to discuss grim outlook with family  Palliative care consulted Close follow-up and monitoring with heme-onc and PCP as above  Anion gap metabolic acidosis with resolving lactic acidosis following seizure resolved Continue IV fluids until p.o. intake increases adequately  Anemia of chronic disease without evidence of hemorrhage POA Follow morning labs, no clear etiology for blood loss  Protein calorie malnutrition in setting of malignancy POA Nutritional consult following Increasing appetite appropriately -speech continues to recommend regular diet thin liquids although patient appears to prefer more soft diet at this time  DVT prophylaxis: SCDs only Code Status: Full Disposition Plan:  Discharge home, follow-up up with PCP, heme-onc, palliative care as above   Allergies as of 10/17/2018   No Known Allergies     Medication List    TAKE these medications   acetaminophen 500 MG tablet Commonly known as:  TYLENOL Take 1,000 mg by mouth every 6 (six) hours as needed for mild pain.   amLODipine 2.5 MG tablet Commonly known as:  NORVASC Take 1 tablet (2.5 mg total) by mouth daily.   cyclobenzaprine 10 MG tablet Commonly known as:  FLEXERIL Take 1 tablet (10 mg total) by mouth 3 (three) times daily as needed for muscle spasms.    dexamethasone 4 MG tablet Commonly known as:  Decadron Take 1 tablet (4 mg total) by mouth 4 (four) times daily for 30 days.   feeding supplement (PRO-STAT SUGAR FREE 64) Liqd Place 30 mLs into feeding tube 3 (three) times daily.   levETIRAcetam 1000 MG tablet Commonly known as:  Keppra Take 1 tablet (1,000 mg total) by mouth 2 (two) times daily.   lidocaine-prilocaine cream Commonly known as:  EMLA Apply 1 application topically as needed (for port).   magic mouthwash w/lidocaine Soln Take 5 mLs by mouth 4 (four) times daily as needed for mouth pain.   ondansetron 8 MG tablet Commonly known as:  ZOFRAN Take 8 mg by mouth every 8 (eight) hours as needed.   potassium chloride SA 20 MEQ tablet Commonly known as:  K-DUR Take 1 tablet (20 mEq total) by mouth 2 (two) times daily.   prochlorperazine 10 MG tablet Commonly known as:  COMPAZINE Take 10 mg by mouth every 6 (six) hours as needed.   vitamin B-12 1000 MCG tablet Commonly known as:  CYANOCOBALAMIN Take 1 tablet (1,000 mcg total) by mouth daily.       No Known Allergies  Consultations:  Neurology, Heme/Onc, Palliative care   Procedures/Studies: Ct Head Wo Contrast  Result Date: 10/14/2018 CLINICAL DATA:  Stroke follow-up EXAM: CT HEAD WITHOUT CONTRAST TECHNIQUE: Contiguous axial images were obtained from the base of the skull through the vertex without intravenous contrast. COMPARISON:  None. FINDINGS: Brain: Unchanged size and distribution of multiple hemorrhagic lesions scattered throughout both cerebral hemispheres, the largest of which is in the left temporal lobe. The surrounding vasogenic  edema patterns are unchanged. Size and configuration of the CSF spaces are also unchanged. Thickened appearance of the pituitary infundibulum. Vascular: No abnormal hyperdensity of the major intracranial arteries or dural venous sinuses. No intracranial atherosclerosis. Skull: The visualized skull base, calvarium and  extracranial soft tissues are normal. Sinuses/Orbits: No fluid levels or advanced mucosal thickening of the visualized paranasal sinuses. No mastoid or middle ear effusion. The orbits are normal. IMPRESSION: 1. Unchanged size and configuration of multiple hemorrhagic lesions scattered throughout both hemispheres (approximately 5). Unchanged edema pattern. MRI of the brain with and without contrast is recommended. 2. Thickened appearance of the pituitary infundibulum may also indicate metastatic disease. Attention recommended on MRI. Electronically Signed   By: Ulyses Jarred M.D.   On: 10/14/2018 03:51   Dg Chest Port 1 View  Result Date: 10/14/2018 CLINICAL DATA:  History of stomach carcinoma, check tube placement EXAM: PORTABLE CHEST 1 VIEW COMPARISON:  10/13/2018, CT from 09/17/2010 FINDINGS: Cardiac shadows within normal limits. Endotracheal tube, gastric catheter right chest wall port are noted. Gastric catheter is noted coiled upon itself within the distal esophagus. Multiple nodular changes are noted in both lungs consistent with metastatic disease. No acute bony abnormality is noted. IMPRESSION: Stable pulmonary metastasis. Gastric catheter coiled upon itself within the distal esophagus. Electronically Signed   By: Inez Catalina M.D.   On: 10/14/2018 07:37   Dg Chest Portable 1 View  Result Date: 10/13/2018 CLINICAL DATA:  Gastric cancer.  Patient intubated. EXAM: PORTABLE CHEST 1 VIEW COMPARISON:  CT scan Sep 17, 2018 FINDINGS: Pulmonary nodularity was better assessed on the previous CT scan and is new since July 10, 2017, consistent with patient's known pulmonary metastases. An ETT terminates in good position. A right Port-A-Cath terminates in the central SVC. No pneumothorax. Skin folds over the upper right chest. No acute infiltrate. IMPRESSION: 1. Support apparatus as above. 2. Known pulmonary metastases. Electronically Signed   By: Dorise Bullion III M.D   On: 10/13/2018 19:46   Dg Abd  Portable 1v  Result Date: 10/14/2018 CLINICAL DATA:  Check gastric catheter placement EXAM: PORTABLE ABDOMEN - 1 VIEW COMPARISON:  None. FINDINGS: Gastric catheter is noted coiled within the distal esophagus. This should be withdrawn and readvanced into the stomach. Scattered large and small bowel gas is noted. IMPRESSION: Gastric catheter coiled within the distal esophagus. Electronically Signed   By: Inez Catalina M.D.   On: 10/14/2018 07:35   Ct Head Code Stroke Wo Contrast`  Result Date: 10/13/2018 CLINICAL DATA:  Code stroke.  Altered mental status EXAM: CT HEAD WITHOUT CONTRAST TECHNIQUE: Contiguous axial images were obtained from the base of the skull through the vertex without intravenous contrast. COMPARISON:  None. FINDINGS: Brain: There are multiple hemorrhagic lesions, the largest of which is located in the left temporal lobe and measures 16 mm. There are at least 5 lesions. No infratentorial lesions are visible. There is no midline shift or other mass effect. Moderate edema surrounds all lesions. No hydrocephalus. Vascular: Atherosclerotic calcification of the internal carotid arteries at the skull base. No abnormal hyperdensity of the major intracranial arteries or dural venous sinuses. Skull: The visualized skull base, calvarium and extracranial soft tissues are normal. Sinuses/Orbits: No fluid levels or advanced mucosal thickening of the visualized paranasal sinuses. No mastoid or middle ear effusion. The orbits are normal. IMPRESSION: 1. At least 5 hyperdense metastases with surrounding edema, greatest in the left temporal lobe. Small volume hemorrhage is associated with most of the lesions. 2. ASPECTS  is not applicable in the setting of hemorrhage These results were communicated to Dr. Roland Rack at 8:02 pm on 10/13/2018 by text page via the [facility] messaging system. Electronically Signed   By: Ulyses Jarred M.D.   On: 10/13/2018 20:06     Subjective: No acute issues or events  overnight, tolerating p.o. well, continues to ambulate with PT, albeit weakly currently requiring stents and recommending home health PT and 24-hour monitoring at home   Discharge Exam: Vitals:   10/17/18 0757 10/17/18 1127  BP: (!) 133/103 114/79  Pulse: 90 79  Resp: 18 18  Temp: 98 F (36.7 C) 98 F (36.7 C)  SpO2: 100% 100%   Vitals:   10/17/18 0433 10/17/18 0454 10/17/18 0757 10/17/18 1127  BP: 117/80  (!) 133/103 114/79  Pulse: 80  90 79  Resp: 16  18 18   Temp: (!) 97.5 F (36.4 C)  98 F (36.7 C) 98 F (36.7 C)  TempSrc: Oral     SpO2: 100%  100% 100%  Weight:  50.9 kg    Height:        General: Pt is alert, awake, not in acute distress Cardiovascular: RRR, S1/S2 +, no rubs, no gallops Respiratory: CTA bilaterally, no wheezing, no rhonchi Abdominal: Soft, NT, ND, bowel sounds + Extremities: no edema, no cyanosis    The results of significant diagnostics from this hospitalization (including imaging, microbiology, ancillary and laboratory) are listed below for reference.     Microbiology: Recent Results (from the past 240 hour(s))  SARS Coronavirus 2 (CEPHEID - Performed in [place] hospital lab), Hosp Order     Status: None   Collection Time: 10/13/18  8:25 PM  Result Value Ref Range Status   SARS Coronavirus 2 NEGATIVE NEGATIVE Final    Comment: (NOTE) If result is NEGATIVE SARS-CoV-2 target nucleic acids are NOT DETECTED. The SARS-CoV-2 RNA is generally detectable in upper and lower  respiratory specimens during the acute phase of infection. The lowest  concentration of SARS-CoV-2 viral copies this assay can detect is 250  copies / mL. A negative result does not preclude SARS-CoV-2 infection  and should not be used as the sole basis for treatment or other  patient management decisions.  A negative result may occur with  improper specimen collection / handling, submission of specimen other  than nasopharyngeal swab, presence of viral mutation(s) within  the  areas targeted by this assay, and inadequate number of viral copies  (<250 copies / mL). A negative result must be combined with clinical  observations, patient history, and epidemiological information. If result is POSITIVE SARS-CoV-2 target nucleic acids are DETECTED. The SARS-CoV-2 RNA is generally detectable in upper and lower  respiratory specimens dur ing the acute phase of infection.  Positive  results are indicative of active infection with SARS-CoV-2.  Clinical  correlation with patient history and other diagnostic information is  necessary to determine patient infection status.  Positive results do  not rule out bacterial infection or co-infection with other viruses. If result is PRESUMPTIVE POSTIVE SARS-CoV-2 nucleic acids MAY BE PRESENT.   A presumptive positive result was obtained on the submitted specimen  and confirmed on repeat testing.  While 2019 novel coronavirus  (SARS-CoV-2) nucleic acids may be present in the submitted sample  additional confirmatory testing may be necessary for epidemiological  and / or clinical management purposes  to differentiate between  SARS-CoV-2 and other Sarbecovirus currently known to infect humans.  If clinically indicated additional testing with  an alternate test  methodology [phone]) is advised. The SARS-CoV-2 RNA is generally  detectable in upper and lower respiratory sp ecimens during the acute  phase of infection. The expected result is Negative. Fact Sheet for Patients:  StrictlyIdeas.no Fact Sheet for Healthcare Providers: BankingDealers.co.za This test is not yet approved or cleared by the [place] FDA and has been authorized for detection and/or diagnosis of SARS-CoV-2 by FDA under an Emergency Use Authorization (EUA).  This EUA will remain in effect (meaning this test can be used) for the duration of the COVID-19 declaration under Section 564(b)(1) of the Act, 21  U.S.C. section 360bbb-3(b)(1), unless the authorization is terminated or revoked sooner. Performed at [facility], [place] [address] [place], [place] [address]   MRSA PCR Screening     Status: None   Collection Time: 10/13/18  9:58 PM  Result Value Ref Range Status   MRSA by PCR NEGATIVE NEGATIVE Final    Comment:        The GeneXpert MRSA Assay (FDA approved for NASAL specimens only), is one component of a comprehensive MRSA colonization surveillance program. It is not intended to diagnose MRSA infection nor to guide or monitor treatment for MRSA infections. Performed at [facility], [place] [address] [place], [place] [address]      Labs: BNP (last 3 results) No results for input(s): BNP in the last 8760 hours. Basic Metabolic Panel: Recent Labs  Lab 10/13/18 1921  10/14/18 0219 10/15/18 0146 10/16/18 0232 10/16/18 1608 10/17/18 0329  NA 137   < > 140 148* 147* 149* 145  K 4.4   < > 4.8 4.9 6.0* 4.0 3.8  CL 106   < > 111 121* 119* 119* 112*  CO2 13*  --  16* 19* 18* 20* 22  GLUCOSE 64*   < > 125* 111* 103* 94 120*  BUN 19   < > 18 12 11 11 10   CREATININE 1.12   < > 0.93 0.70 0.62 0.68 0.58*  CALCIUM 8.7*  --  8.2* 8.4* 8.5* 8.8* 8.7*  MG 2.5*  --   --   --   --   --   --    < > = values in this interval not displayed.   Liver Function Tests: Recent Labs  Lab 10/13/18 1921  AST 33  ALT QUANTITY NOT SUFFICIENT, UNABLE TO PERFORM TEST  ALKPHOS 53  BILITOT 0.8  PROT 5.1*  ALBUMIN 2.2*   No results for input(s): LIPASE, AMYLASE in the last 168 hours. Recent Labs  Lab 10/13/18 1930  AMMONIA 80*   CBC: Recent Labs  Lab 10/13/18 1921  10/13/18 2042 10/14/18 0219 10/15/18 0146 10/16/18 0232 10/17/18 0329  WBC 4.7  --   --  6.3 11.3* 10.8* 11.9*  NEUTROABS 3.2  --   --   --   --   --   --   HGB 12.1*   < > 10.9* 12.6* 12.7* 11.4* 11.3*  HCT 37.0*   < > 32.0* 35.7* 36.3* 33.6* 32.3*  MCV 88.9  --   --  82.8 83.6 85.9 84.6  PLT 245  --   --   296 244 252 242   < > = values in this interval not displayed.   Cardiac Enzymes: Recent Labs  Lab 10/13/18 1921  TROPONINI <0.03   BNP: Invalid input(s): POCBNP CBG: No results for input(s): GLUCAP in the last 168 hours. D-Dimer No results for input(s): DDIMER in the  last 72 hours. Hgb A1c No results for input(s): HGBA1C in the last 72 hours. Lipid Profile No results for input(s): CHOL, HDL, LDLCALC, TRIG, CHOLHDL, LDLDIRECT in the last 72 hours. Thyroid function studies No results for input(s): TSH, T4TOTAL, T3FREE, THYROIDAB in the last 72 hours.  Invalid input(s): FREET3 Anemia work up No results for input(s): VITAMINB12, FOLATE, FERRITIN, TIBC, IRON, RETICCTPCT in the last 72 hours. Urinalysis    Component Value Date/Time   COLORURINE YELLOW 10/13/2018 2030   APPEARANCEUR HAZY (A) 10/13/2018 2030   LABSPEC 1.008 10/13/2018 2030   PHURINE 6.0 10/13/2018 2030   GLUCOSEU NEGATIVE 10/13/2018 2030   [place] NEGATIVE 10/13/2018 2030   [place] NEGATIVE 10/13/2018 2030   BILIRUBINUR neg 07/11/2018 0915   KETONESUR NEGATIVE 10/13/2018 2030   PROTEINUR 30 (A) 10/13/2018 2030   UROBILINOGEN 0.2 07/11/2018 0915   NITRITE NEGATIVE 10/13/2018 2030   LEUKOCYTESUR NEGATIVE 10/13/2018 2030   Sepsis Labs Invalid input(s): PROCALCITONIN,  WBC,  LACTICIDVEN Microbiology Recent Results (from the past 240 hour(s))  SARS Coronavirus 2 (CEPHEID - Performed in [place] hospital lab), Hosp Order     Status: None   Collection Time: 10/13/18  8:25 PM  Result Value Ref Range Status   SARS Coronavirus 2 NEGATIVE NEGATIVE Final    Comment: (NOTE) If result is NEGATIVE SARS-CoV-2 target nucleic acids are NOT DETECTED. The SARS-CoV-2 RNA is generally detectable in upper and lower  respiratory specimens during the acute phase of infection. The lowest  concentration of SARS-CoV-2 viral copies this assay can detect is 250  copies / mL. A negative result does not preclude SARS-CoV-2  infection  and should not be used as the sole basis for treatment or other  patient management decisions.  A negative result may occur with  improper specimen collection / handling, submission of specimen other  than nasopharyngeal swab, presence of viral mutation(s) within the  areas targeted by this assay, and inadequate number of viral copies  (<250 copies / mL). A negative result must be combined with clinical  observations, patient history, and epidemiological information. If result is POSITIVE SARS-CoV-2 target nucleic acids are DETECTED. The SARS-CoV-2 RNA is generally detectable in upper and lower  respiratory specimens dur ing the acute phase of infection.  Positive  results are indicative of active infection with SARS-CoV-2.  Clinical  correlation with patient history and other diagnostic information is  necessary to determine patient infection status.  Positive results do  not rule out bacterial infection or co-infection with other viruses. If result is PRESUMPTIVE POSTIVE SARS-CoV-2 nucleic acids MAY BE PRESENT.   A presumptive positive result was obtained on the submitted specimen  and confirmed on repeat testing.  While 2019 novel coronavirus  (SARS-CoV-2) nucleic acids may be present in the submitted sample  additional confirmatory testing may be necessary for epidemiological  and / or clinical management purposes  to differentiate between  SARS-CoV-2 and other Sarbecovirus currently known to infect humans.  If clinically indicated additional testing with an alternate test  methodology [phone]) is advised. The SARS-CoV-2 RNA is generally  detectable in upper and lower respiratory sp ecimens during the acute  phase of infection. The expected result is Negative. Fact Sheet for Patients:  StrictlyIdeas.no Fact Sheet for Healthcare Providers: BankingDealers.co.za This test is not yet approved or cleared by the [place]  FDA and has been authorized for detection and/or diagnosis of SARS-CoV-2 by FDA under an Emergency Use Authorization (EUA).  This EUA will remain in effect (meaning  this test can be used) for the duration of the COVID-19 declaration under Section 564(b)(1) of the Act, 21 U.S.C. section 360bbb-3(b)(1), unless the authorization is terminated or revoked sooner. Performed at [facility], [place] [address] [place], [place] [address]   MRSA PCR Screening     Status: None   Collection Time: 10/13/18  9:58 PM  Result Value Ref Range Status   MRSA by PCR NEGATIVE NEGATIVE Final    Comment:        The GeneXpert MRSA Assay (FDA approved for NASAL specimens only), is one component of a comprehensive MRSA colonization surveillance program. It is not intended to diagnose MRSA infection nor to guide or monitor treatment for MRSA infections. Performed at [facility], [place] [address], [place], [place] [address]     Time coordinating discharge: Over 38 minutes  SIGNED:  Little Ishikawa, DO [facility] 10/17/2018, 2:17 PM

## 2018-10-17 NOTE — Progress Notes (Addendum)
NCM spoke with patient, to see if he knew what [facility] agency he would like to work with, he states to call his mother or Haskell Flirt, [place] spoke with mother, she states he is not ready to come home yet, NCM informed her that we are just trying to see if they know what [facility] agency they would like to work with when he does get ready to go home, and also informed her that with [facility] services they can also have palliative services.  Mother states they are in process of talking about this , but has not made a decision.  They would like to speak with MD to see how patient is doing .  NCM gave mother phone number to reach unit to ask for RN caring for patient.  56 NCM spoke with Mother who states she can not take care of patient at home, she would like for him to go to a SNF, NCM informed MD and CSW of this information. CSW will speak with patient regarding SNF placement to see if he would agree.

## 2018-10-17 NOTE — TOC Progression Note (Signed)
Transition of Care [facility]) - Progression Note    Patient Details  Name: Ryan Collins MRN: [id number] Date of Birth: 21-Mar-1961  Transition of Care [facility]) CM/SW Contact  Ryan Mayo, RN Phone Number: 10/17/2018, 4:14 PM  Clinical Narrative:    From home alone, has gastric ca, mets to brain, oncology following.  NCM spoke with patient regarding [place] services , he states to call his mother or sister  Ryan Collins.  NCM spoke with mother, she states that she can not take care of patient , she is 57 years old.  She states he needs hospital bed, bsc and rolling walker.  NCM informed her that this can be ordered for him , which was ordered thru [employer] with [facility], mom is contact to coordinate bed delivery. He will need HHRN, and SW.  NCM has tried several agencies, [place], [place], [place], [facility], [facility]. They were not able to take patient.  NCM contacted [facility], they were able to take patient and will not be able to soc til next Wed or Thursday.  This information was given to patient mother, she states she will be at his home and her other son will be there sometime to help also.  A referral was made for palliative services to [place] in [place].  Patient will need ambulance transport home , address confirmed with mother. Please call her to make sure DME has been delivered before patient is discharged.    Expected Discharge Plan: [place] Barriers to Discharge: No Barriers Identified  Expected Discharge Plan and Services Expected Discharge Plan: [place] In-house Referral: Clinical Social Work Discharge Planning Services: CM Consult Post Acute Care Choice: [place] arrangements for the past 2 months: Single Family Home Expected Discharge Date: 10/17/18               DME Arranged: Bedside commode, Walker rolling, Hospital bed DME Agency: [facility] Date DME Agency Contacted: 10/17/18 Time DME Agency Contacted: [phone] Representative  spoke with at DME Agency: zack HH Arranged: PT, OT, RN, Social Work           Social Determinants of Health ([place]) Interventions    Readmission Risk Interventions No flowsheet data found.

## 2018-10-18 LAB — CBC
HCT: 33.5 % — ABNORMAL LOW (ref 39.0–52.0)
Hemoglobin: 11.7 g/dL — ABNORMAL LOW (ref 13.0–17.0)
MCH: 29.6 pg (ref 26.0–34.0)
MCHC: 34.9 g/dL (ref 30.0–36.0)
MCV: 84.8 fL (ref 80.0–100.0)
Platelets: 299 10*3/uL (ref 150–400)
RBC: 3.95 MIL/uL — ABNORMAL LOW (ref 4.22–5.81)
RDW: 21.5 % — ABNORMAL HIGH (ref 11.5–15.5)
WBC: 10.4 10*3/uL (ref 4.0–10.5)
nRBC: 1 % — ABNORMAL HIGH (ref 0.0–0.2)

## 2018-10-18 LAB — BASIC METABOLIC PANEL
Anion gap: 10 (ref 5–15)
BUN: 9 mg/dL (ref 6–20)
CO2: 25 mmol/L (ref 22–32)
Calcium: 8.9 mg/dL (ref 8.9–10.3)
Chloride: 111 mmol/L (ref 98–111)
Creatinine, Ser: 0.54 mg/dL — ABNORMAL LOW (ref 0.61–1.24)
GFR calc Af Amer: >60 mL/min (ref >60)
GFR calc non Af Amer: >60 mL/min (ref >60)
Glucose, Bld: 110 mg/dL — ABNORMAL HIGH (ref 70–99)
Potassium: 3.9 mmol/L (ref 3.5–5.1)
Sodium: 146 mmol/L — ABNORMAL HIGH (ref 135–145)

## 2018-10-18 NOTE — Progress Notes (Signed)
Physician Progress Note  Johathon Overturf NWG:[id number] DOB: February 27, 1961 DOA: 10/13/2018  PCP: Lanae Boast, FNP  Admit date: 10/13/2018 Discharge date: 10/18/2018  Admitted From: Home Disposition: Home, with home health   Recommendations for Outpatient Follow-up:  1. Follow up with PCP/oncology in 1-2 weeks 2. Patient will need close follow-up with palliative care as well per discussion with patient's mother requesting this at discharge  Home Health: Yes Equipment/Devices: Walker  Discharge Condition: Stable CODE STATUS: Full Diet recommendation: As tolerated  Brief/Interim Summary: 58ym[age] brought in by EMS unresponsive. He has a history of metastatic gastric cancer. Apparently his had a continued decline over the past week and then today acutely worsened. On EMS arrival he was very poorly responsive. They question a possible left-sided gaze. He has been a code stroke because of this. Arrival to the emergency room he had a GCS of 3. He was intubated for airway protection. When RSI meds are being pushed he did have some spontaneous movement of his right lower extremity and did shake his head a little bit. Admitted to ICU with ? New onset seizure with notable brain mets on imaging. Neurology recommending AED with keppra and now awake alert and back to baseline mental status.  Patient admitted as above altered mental status concerning for seizure-like activity and findings of metastatic disease to the brain this remains the most likely cause.  Patient initially had a GCS of 3 intubated sent to the ICU, subsequently extubated, neurology following previously recommending Keppra and dexamethasone for metastatic disease.  Lengthy discussion with patient's 2 sisters previously about patient's prognosis and recommendations for DNR status as well as further discussion with palliative care/hospice. Unfortunately we were disconnected and family no longer answering their phone.  Attempted to call  patient's mother multiple times as well with no answer.  At this point patient has been evaluated by PT recommending home health with physical therapy, patient continues to be somewhat clear on his wishes for future care, will need close follow-up with PCP as well as heme-onc in the next 1 week for further discussion and evaluation for possible met with palliative care and hospice.  Patient discussion with heme-onc, was initially made DNR/DNI however was reversed back to full code later that evening.  This time continue supportive care, will need further hydration and possible imaging pending work-up. Will discharge on Decadron as well as Keppra per neurology recommendations previously.  **UPDATE LATE 6/5-6/20 - lengthy discussion with patient and mother yesterday who was available for phone conference. Patient is notably weak but given his cancer diagnosis this will likely not improve in any meaningful way. Patient was cleared for DC home with HH/PT as he was continuously refusing SNF placement. Patient was to be discharged home as agreed upon previously. Mother refusing acceptance as she does not feel she can handle his weakened state. She stated that with the right equipment she would agree (walker, hospital bed). This was to be setup; however, the mother then rescinded her offer and requested SNF evaluation - which the patient continued to refuse. Dr Burr Medico was further involved in discussion about further treatment vs palliative care/hospice discussion.  **10/18/18 patient remained overnight due to social issues with placement and refusal for acceptance home with Mother. Patient and family now agreeable to go home with home health/palliative care - MRI done to help assist rad/onc with possible future treatment early next week. Patient otherwise stable for discharge - no acute issues or events overnight, stable and agreeable for DC home.  Discharge Diagnoses:  Active Problems:   Respiratory failure ([place])    Brain metastases ([place])   Glasgow coma scale total score 3-8 ([place])   Palliative care by specialist  Acute metabolic encephalopathy. Questionable seizure with postictal state, resolved  In the setting of known multiple cerebral metastases Follow with neurology, heme-onc, PCP as scheduled Continue Decadron and Keppra  Acute hypoxic respiratory failure in the setting of ineffective airway protection secondary to above, resolved Extubated 10/14/2018 successfully, weaned off oxygen, currently on room air  Widely metastatic gastric carcinoma with metastasis to brain throughout the abdomen and also thorax, now with progressive failure to thrive -Currently on Keytruda -Already undergone extensive chemotherapy and surgery -Mets to brain and abdomen indicate very poor prognosis - continue to discuss grim outlook with family -Heme/onc and Rads/onc to discuss radiation therapy early next week in outpatient setting  Palliative care consulted Close follow-up and monitoring with heme-onc and PCP as above  Anion gap metabolic acidosis with resolving lactic acidosis following seizure resolved Continue IV fluids until p.o. intake increases adequately  Anemia of chronic disease without evidence of hemorrhage POA Follow morning labs, no clear etiology for blood loss  Protein calorie malnutrition in setting of malignancy POA Nutritional consult following Increasing appetite appropriately -speech continues to recommend regular diet thin liquids although patient appears to prefer more soft diet at this time  DVT prophylaxis: SCDs only Code Status: Full Disposition Plan:  Discharge home, follow-up up with PCP, heme-onc, palliative care as above  Discharge Instructions    Diet - low sodium heart healthy   Complete by:  As directed    Increase activity slowly   Complete by:  As directed      Allergies as of 10/18/2018   No Known Allergies     Medication List    TAKE these medications    acetaminophen 500 MG tablet Commonly known as:  TYLENOL Take 1,000 mg by mouth every 6 (six) hours as needed for mild pain.   amLODipine 2.5 MG tablet Commonly known as:  NORVASC Take 1 tablet (2.5 mg total) by mouth daily.   cyclobenzaprine 10 MG tablet Commonly known as:  FLEXERIL Take 1 tablet (10 mg total) by mouth 3 (three) times daily as needed for muscle spasms.   dexamethasone 4 MG tablet Commonly known as:  Decadron Take 1 tablet (4 mg total) by mouth 4 (four) times daily for 30 days.   feeding supplement (PRO-STAT SUGAR FREE 64) Liqd Place 30 mLs into feeding tube 3 (three) times daily.   levETIRAcetam 1000 MG tablet Commonly known as:  Keppra Take 1 tablet (1,000 mg total) by mouth 2 (two) times daily.   lidocaine-prilocaine cream Commonly known as:  EMLA Apply 1 application topically as needed (for port).   magic mouthwash w/lidocaine Soln Take 5 mLs by mouth 4 (four) times daily as needed for mouth pain.   ondansetron 8 MG tablet Commonly known as:  ZOFRAN Take 8 mg by mouth every 8 (eight) hours as needed.   potassium chloride SA 20 MEQ tablet Commonly known as:  K-DUR Take 1 tablet (20 mEq total) by mouth 2 (two) times daily.   prochlorperazine 10 MG tablet Commonly known as:  COMPAZINE Take 10 mg by mouth every 6 (six) hours as needed.   vitamin B-12 1000 MCG tablet Commonly known as:  CYANOCOBALAMIN Take 1 tablet (1,000 mcg total) by mouth daily.            Durable Medical Equipment  (From admission,  onward)         Start     Ordered   10/17/18 1703  For home use only DME Bedside commode  Once    Question:  Patient needs a bedside commode to treat with the following condition  Answer:  Weakness   10/17/18 1702   10/17/18 1548  For home use only DME Hospital bed  Once    Question Answer Comment  Length of Need Lifetime   The above medical condition requires: Patient requires the ability to reposition frequently   Head must be elevated  greater than: 30 degrees   Bed type Semi-electric      10/17/18 1547   10/17/18 1424  For home use only DME Walker  [facility])  Once    Question:  Patient needs a walker to treat with the following condition  Answer:  Ambulatory dysfunction   10/17/18 1424         Follow-up Information    [facility] Follow up.   Why:  walker, bsc, hospital bed       [facility] Follow up.   Specialty:  Home Health Services Why:  [facility], HHPT Contact information: [address] [place] [address] [phone]          No Known Allergies  Consultations:  Neurology, Heme/Onc, Palliative care   Procedures/Studies: Ct Head Wo Contrast  Result Date: 10/14/2018 CLINICAL DATA:  Stroke follow-up EXAM: CT HEAD WITHOUT CONTRAST TECHNIQUE: Contiguous axial images were obtained from the base of the skull through the vertex without intravenous contrast. COMPARISON:  None. FINDINGS: Brain: Unchanged size and distribution of multiple hemorrhagic lesions scattered throughout both cerebral hemispheres, the largest of which is in the left temporal lobe. The surrounding vasogenic edema patterns are unchanged. Size and configuration of the CSF spaces are also unchanged. Thickened appearance of the pituitary infundibulum. Vascular: No abnormal hyperdensity of the major intracranial arteries or dural venous sinuses. No intracranial atherosclerosis. Skull: The visualized skull base, calvarium and extracranial soft tissues are normal. Sinuses/Orbits: No fluid levels or advanced mucosal thickening of the visualized paranasal sinuses. No mastoid or middle ear effusion. The orbits are normal. IMPRESSION: 1. Unchanged size and configuration of multiple hemorrhagic lesions scattered throughout both hemispheres (approximately 5). Unchanged edema pattern. MRI of the brain with and without contrast is recommended. 2. Thickened appearance of the pituitary infundibulum may also indicate metastatic  disease. Attention recommended on MRI. Electronically Signed   By: Ulyses Jarred M.D.   On: 10/14/2018 03:51   Mr Jeri Cos CB Contrast  Result Date: 10/18/2018 CLINICAL DATA:  Intracranial metastatic disease. Planning for stereotactic radio surgery. EXAM: MRI HEAD WITHOUT AND WITH CONTRAST TECHNIQUE: Multiplanar, multiecho pulse sequences of the brain and surrounding structures were obtained without and with intravenous contrast. CONTRAST:  5 mL Gadavist COMPARISON:  Head CT 10/13/2018, 10/14/2018 FINDINGS: BRAIN: There is no acute ischemic infarct. No midline shift or hydrocephalus. Atrophy is greater than expected for age. Susceptibility weighted imaging is degraded by motion but there is no large hematoma. There are multiple intraparenchymal masses: 1. Right cerebellum, 18 x 15 mm, mild surrounding edema. Series 13, image 56 2. Base of the pituitary infundibulum, 8 x 8 mm.  Image 90 3. Left temporal lobe, 18 x 17 mm, moderate edema.  Image 100 4. Left occipital lobe, 3 mm, no edema.  Image 106 5. Posterior left insula, 14 x 13 mm, mild edema.  Image 112 6. Right frontal posterior white matter 10 x  11 mm, moderate edema. Image 117 7. Left frontal lobe, 14 x 12 mm, moderate surrounding edema. Image 132 8. Subcortical posterior right frontal lobe 14 x 10 mm, mild surrounding edema. Image 141 9. Superior left frontal lobe, 15 x 13 mm, moderate edema. Image 153 10. Superior right frontal lobe, 10 x 10 mm, mild edema.  Image 141 VASCULAR: The major intracranial arterial and venous sinus flow voids are normal. SKULL AND UPPER CERVICAL SPINE: Calvarial bone marrow signal is normal. There is no skull base mass. Visualized upper cervical spine and soft tissues are normal. SINUSES/ORBITS: No fluid levels or advanced mucosal thickening. No mastoid or middle ear effusion. The orbits are normal. IMPRESSION: 1. At least 10 intraparenchymal metastases, the largest of which is located in the left temporal lobe and measures 18 x  17 mm. Multiple lesions have moderate surrounding edema. 2. No herniation, midline shift or hydrocephalus. 3. The lesions are annotated on series 13. Electronically Signed   By: Ulyses Jarred M.D.   On: 10/18/2018 00:06   Dg Chest Port 1 View  Result Date: 10/14/2018 CLINICAL DATA:  History of stomach carcinoma, check tube placement EXAM: PORTABLE CHEST 1 VIEW COMPARISON:  10/13/2018, CT from 09/17/2010 FINDINGS: Cardiac shadows within normal limits. Endotracheal tube, gastric catheter right chest wall port are noted. Gastric catheter is noted coiled upon itself within the distal esophagus. Multiple nodular changes are noted in both lungs consistent with metastatic disease. No acute bony abnormality is noted. IMPRESSION: Stable pulmonary metastasis. Gastric catheter coiled upon itself within the distal esophagus. Electronically Signed   By: Inez Catalina M.D.   On: 10/14/2018 07:37   Dg Chest Portable 1 View  Result Date: 10/13/2018 CLINICAL DATA:  Gastric cancer.  Patient intubated. EXAM: PORTABLE CHEST 1 VIEW COMPARISON:  CT scan Sep 17, 2018 FINDINGS: Pulmonary nodularity was better assessed on the previous CT scan and is new since July 10, 2017, consistent with patient's known pulmonary metastases. An ETT terminates in good position. A right Port-A-Cath terminates in the central SVC. No pneumothorax. Skin folds over the upper right chest. No acute infiltrate. IMPRESSION: 1. Support apparatus as above. 2. Known pulmonary metastases. Electronically Signed   By: Dorise Bullion III M.D   On: 10/13/2018 19:46   Dg Abd Portable 1v  Result Date: 10/14/2018 CLINICAL DATA:  Check gastric catheter placement EXAM: PORTABLE ABDOMEN - 1 VIEW COMPARISON:  None. FINDINGS: Gastric catheter is noted coiled within the distal esophagus. This should be withdrawn and readvanced into the stomach. Scattered large and small bowel gas is noted. IMPRESSION: Gastric catheter coiled within the distal esophagus. Electronically  Signed   By: Inez Catalina M.D.   On: 10/14/2018 07:35   Ct Head Code Stroke Wo Contrast`  Result Date: 10/13/2018 CLINICAL DATA:  Code stroke.  Altered mental status EXAM: CT HEAD WITHOUT CONTRAST TECHNIQUE: Contiguous axial images were obtained from the base of the skull through the vertex without intravenous contrast. COMPARISON:  None. FINDINGS: Brain: There are multiple hemorrhagic lesions, the largest of which is located in the left temporal lobe and measures 16 mm. There are at least 5 lesions. No infratentorial lesions are visible. There is no midline shift or other mass effect. Moderate edema surrounds all lesions. No hydrocephalus. Vascular: Atherosclerotic calcification of the internal carotid arteries at the skull base. No abnormal hyperdensity of the major intracranial arteries or dural venous sinuses. Skull: The visualized skull base, calvarium and extracranial soft tissues are normal. Sinuses/Orbits: No fluid levels or  advanced mucosal thickening of the visualized paranasal sinuses. No mastoid or middle ear effusion. The orbits are normal. IMPRESSION: 1. At least 5 hyperdense metastases with surrounding edema, greatest in the left temporal lobe. Small volume hemorrhage is associated with most of the lesions. 2. ASPECTS is not applicable in the setting of hemorrhage These results were communicated to Dr. Roland Rack at 8:02 pm on 10/13/2018 by text page via the [facility] messaging system. Electronically Signed   By: Ulyses Jarred M.D.   On: 10/13/2018 20:06     Subjective: No acute issues or events overnight, tolerating p.o. well, continues to ambulate with PT, albeit weakly currently requiring stents and recommending home health PT and 24-hour monitoring at home   Discharge Exam: Vitals:   10/18/18 0715 10/18/18 1136  BP: 115/89 (!) 120/93  Pulse: 77 79  Resp: 16 16  Temp: 98 F (36.7 C) 98.3 F (36.8 C)  SpO2: 100% 100%   Vitals:   10/18/18 0434 10/18/18 0514 10/18/18 0715  10/18/18 1136  BP:  (!) 131/93 115/89 (!) 120/93  Pulse:  73 77 79  Resp:  _0 Temp:  98 F (36.7 C) 98 F (36.7 C) 98.3 F (36.8 C)  TempSrc:  Oral Oral Oral  SpO2:  100% 100% 100%  Weight: 50.9 kg     Height:        General: Pt is alert, awake, not in acute distress Cardiovascular: RRR, S1/S2 +, no rubs, no gallops Respiratory: CTA bilaterally, no wheezing, no rhonchi Abdominal: Soft, NT, ND, bowel sounds + Extremities: no edema, no cyanosis    The results of significant diagnostics from this hospitalization (including imaging, microbiology, ancillary and laboratory) are listed below for reference.     Microbiology: Recent Results (from the past 240 hour(s))  SARS Coronavirus 2 (CEPHEID - Performed in [place] hospital lab), Hosp Order     Status: None   Collection Time: 10/13/18  8:25 PM  Result Value Ref Range Status   SARS Coronavirus 2 NEGATIVE NEGATIVE Final    Comment: (NOTE) If result is NEGATIVE SARS-CoV-2 target nucleic acids are NOT DETECTED. The SARS-CoV-2 RNA is generally detectable in upper and lower  respiratory specimens during the acute phase of infection. The lowest  concentration of SARS-CoV-2 viral copies this assay can detect is 250  copies / mL. A negative result does not preclude SARS-CoV-2 infection  and should not be used as the sole basis for treatment or other  patient management decisions.  A negative result may occur with  improper specimen collection / handling, submission of specimen other  than nasopharyngeal swab, presence of viral mutation(s) within the  areas targeted by this assay, and inadequate number of viral copies  (<250 copies / mL). A negative result must be combined with clinical  observations, patient history, and epidemiological information. If result is POSITIVE SARS-CoV-2 target nucleic acids are DETECTED. The SARS-CoV-2 RNA is generally detectable in upper and lower  respiratory specimens dur ing the acute  phase of infection.  Positive  results are indicative of active infection with SARS-CoV-2.  Clinical  correlation with patient history and other diagnostic information is  necessary to determine patient infection status.  Positive results do  not rule out bacterial infection or co-infection with other viruses. If result is PRESUMPTIVE POSTIVE SARS-CoV-2 nucleic acids MAY BE PRESENT.   A presumptive positive result was obtained on the submitted specimen  and confirmed on repeat testing.  While 2019 novel coronavirus  (SARS-CoV-2)  nucleic acids may be present in the submitted sample  additional confirmatory testing may be necessary for epidemiological  and / or clinical management purposes  to differentiate between  SARS-CoV-2 and other Sarbecovirus currently known to infect humans.  If clinically indicated additional testing with an alternate test  methodology [id number]) is advised. The SARS-CoV-2 RNA is generally  detectable in upper and lower respiratory sp ecimens during the acute  phase of infection. The expected result is Negative. Fact Sheet for Patients:  StrictlyIdeas.no Fact Sheet for Healthcare Providers: BankingDealers.co.za This test is not yet approved or cleared by the [place] FDA and has been authorized for detection and/or diagnosis of SARS-CoV-2 by FDA under an Emergency Use Authorization (EUA).  This EUA will remain in effect (meaning this test can be used) for the duration of the COVID-19 declaration under Section 564(b)(1) of the Act, 21 U.S.C. section 360bbb-3(b)(1), unless the authorization is terminated or revoked sooner. Performed at [facility], [place] [address], [place], [place] [address]   MRSA PCR Screening     Status: None   Collection Time: 10/13/18  9:58 PM  Result Value Ref Range Status   MRSA by PCR NEGATIVE NEGATIVE Final    Comment:        The GeneXpert MRSA Assay (FDA approved for NASAL  specimens only), is one component of a comprehensive MRSA colonization surveillance program. It is not intended to diagnose MRSA infection nor to guide or monitor treatment for MRSA infections. Performed at [facility], [place] [address], [place], [place] [address]      Labs: BNP (last 3 results) No results for input(s): BNP in the last 8760 hours. Basic Metabolic Panel: Recent Labs  Lab 10/13/18 1921  10/15/18 0146 10/16/18 0232 10/16/18 1608 10/17/18 0329 10/18/18 0337  NA 137   < > 148* 147* 149* 145 146*  K 4.4   < > 4.9 6.0* 4.0 3.8 3.9  CL 106   < > 121* 119* 119* 112* 111  CO2 13*   < > 19* 18* 20* 22 25  GLUCOSE 64*   < > 111* 103* 94 120* 110*  BUN 19   < > _0 CREATININE 1.12   < > 0.70 0.62 0.68 0.58* 0.54*  CALCIUM 8.7*   < > 8.4* 8.5* 8.8* 8.7* 8.9  MG 2.5*  --   --   --   --   --   --    < > = values in this interval not displayed.   Liver Function Tests: Recent Labs  Lab 10/13/18 1921  AST 33  ALT QUANTITY NOT SUFFICIENT, UNABLE TO PERFORM TEST  ALKPHOS 53  BILITOT 0.8  PROT 5.1*  ALBUMIN 2.2*   No results for input(s): LIPASE, AMYLASE in the last 168 hours. Recent Labs  Lab 10/13/18 1930  AMMONIA 80*   CBC: Recent Labs  Lab 10/13/18 1921  10/14/18 0219 10/15/18 0146 10/16/18 0232 10/17/18 0329 10/18/18 0337  WBC 4.7  --  6.3 11.3* 10.8* 11.9* 10.4  NEUTROABS 3.2  --   --   --   --   --   --   HGB 12.1*   < > 12.6* 12.7* 11.4* 11.3* 11.7*  HCT 37.0*   < > 35.7* 36.3* 33.6* 32.3* 33.5*  MCV 88.9  --  82.8 83.6 85.9 84.6 84.8  PLT 245  --  296 244 252 242 299   < > = values in this  interval not displayed.   Cardiac Enzymes: Recent Labs  Lab 10/13/18 1921  TROPONINI <0.03   BNP: Invalid input(s): POCBNP CBG: Recent Labs  Lab 10/17/18 2316  GLUCAP 106*   D-Dimer No results for input(s): DDIMER in the last 72 hours. Hgb A1c No results for input(s): HGBA1C in the last 72 hours. Lipid Profile No results for  input(s): CHOL, HDL, LDLCALC, TRIG, CHOLHDL, LDLDIRECT in the last 72 hours. Thyroid function studies No results for input(s): TSH, T4TOTAL, T3FREE, THYROIDAB in the last 72 hours.  Invalid input(s): FREET3 Anemia work up No results for input(s): VITAMINB12, FOLATE, FERRITIN, TIBC, IRON, RETICCTPCT in the last 72 hours. Urinalysis    Component Value Date/Time   COLORURINE YELLOW 10/13/2018 2030   APPEARANCEUR HAZY (A) 10/13/2018 2030   LABSPEC 1.008 10/13/2018 2030   PHURINE 6.0 10/13/2018 2030   GLUCOSEU NEGATIVE 10/13/2018 2030   [place] NEGATIVE 10/13/2018 2030   [place] NEGATIVE 10/13/2018 2030   BILIRUBINUR neg 07/11/2018 0915   KETONESUR NEGATIVE 10/13/2018 2030   PROTEINUR 30 (A) 10/13/2018 2030   UROBILINOGEN 0.2 07/11/2018 0915   NITRITE NEGATIVE 10/13/2018 2030   LEUKOCYTESUR NEGATIVE 10/13/2018 2030   Sepsis Labs Invalid input(s): PROCALCITONIN,  WBC,  LACTICIDVEN Microbiology Recent Results (from the past 240 hour(s))  SARS Coronavirus 2 (CEPHEID - Performed in [place] hospital lab), Hosp Order     Status: None   Collection Time: 10/13/18  8:25 PM  Result Value Ref Range Status   SARS Coronavirus 2 NEGATIVE NEGATIVE Final    Comment: (NOTE) If result is NEGATIVE SARS-CoV-2 target nucleic acids are NOT DETECTED. The SARS-CoV-2 RNA is generally detectable in upper and lower  respiratory specimens during the acute phase of infection. The lowest  concentration of SARS-CoV-2 viral copies this assay can detect is 250  copies / mL. A negative result does not preclude SARS-CoV-2 infection  and should not be used as the sole basis for treatment or other  patient management decisions.  A negative result may occur with  improper specimen collection / handling, submission of specimen other  than nasopharyngeal swab, presence of viral mutation(s) within the  areas targeted by this assay, and inadequate number of viral copies  (<250 copies / mL). A negative result must  be combined with clinical  observations, patient history, and epidemiological information. If result is POSITIVE SARS-CoV-2 target nucleic acids are DETECTED. The SARS-CoV-2 RNA is generally detectable in upper and lower  respiratory specimens dur ing the acute phase of infection.  Positive  results are indicative of active infection with SARS-CoV-2.  Clinical  correlation with patient history and other diagnostic information is  necessary to determine patient infection status.  Positive results do  not rule out bacterial infection or co-infection with other viruses. If result is PRESUMPTIVE POSTIVE SARS-CoV-2 nucleic acids MAY BE PRESENT.   A presumptive positive result was obtained on the submitted specimen  and confirmed on repeat testing.  While 2019 novel coronavirus  (SARS-CoV-2) nucleic acids may be present in the submitted sample  additional confirmatory testing may be necessary for epidemiological  and / or clinical management purposes  to differentiate between  SARS-CoV-2 and other Sarbecovirus currently known to infect humans.  If clinically indicated additional testing with an alternate test  methodology [phone]) is advised. The SARS-CoV-2 RNA is generally  detectable in upper and lower respiratory sp ecimens during the acute  phase of infection. The expected result is Negative. Fact Sheet for Patients:  StrictlyIdeas.no Fact Sheet for  Healthcare Providers: BankingDealers.co.za This test is not yet approved or cleared by the [place] and has been authorized for detection and/or diagnosis of SARS-CoV-2 by FDA under an Emergency Use Authorization (EUA).  This EUA will remain in effect (meaning this test can be used) for the duration of the COVID-19 declaration under Section 564(b)(1) of the Act, 21 U.S.C. section 360bbb-3(b)(1), unless the authorization is terminated or revoked sooner. Performed at [facility], [place] [address] [place], [place] [address]   MRSA PCR Screening     Status: None   Collection Time: 10/13/18  9:58 PM  Result Value Ref Range Status   MRSA by PCR NEGATIVE NEGATIVE Final    Comment:        The GeneXpert MRSA Assay (FDA approved for NASAL specimens only), is one component of a comprehensive MRSA colonization surveillance program. It is not intended to diagnose MRSA infection nor to guide or monitor treatment for MRSA infections. Performed at [facility], [place] [address] [place], [place] [address]     Time coordinating discharge: Over 38 minutes  SIGNED:  Little Ishikawa, DO [facility] 10/18/2018, 11:50 AM

## 2018-10-18 NOTE — Care Management (Signed)
Verified with patient;s mother that DME has been delivered to house. She is at his house, address on face sheet, waiting for his arrival from hospital. Spoke w bedside RN, patient will be ready for PTAR at 4:00. PTAR papers printed to 3w nurse's station, nurse is aware. PTAR called and arranged for pickup at 4pm.

## 2018-10-18 NOTE — Plan of Care (Signed)
Adequate for discharge.

## 2018-10-18 NOTE — Progress Notes (Signed)
Manufacturing engineer ([facility]) - Community Based Palliative Care  Attempted to reach patient's sister Denman George by phone with no success. Spoke with patient's mother by phone for updates. She explains Denman George is working 12 hour days and will not be off again until Monday. Encouraged her to have [place] call at her convenience including after 7 pm. Will continue to attempt contact with Yolanda to determine visit type and time.   Thank you,  Heloise Purpura [phone]  [facility] hospital liaisons are listed daily on AMION under Hospice and [place]

## 2018-10-18 NOTE — Plan of Care (Signed)
Progressing towards discharge 

## 2018-10-21 ENCOUNTER — Telehealth: Payer: Self-pay | Admitting: Family Medicine

## 2018-10-21 ENCOUNTER — Telehealth: Payer: Self-pay

## 2018-10-21 ENCOUNTER — Other Ambulatory Visit: Payer: Medicaid Other | Admitting: Adult Health Nurse Practitioner

## 2018-10-21 ENCOUNTER — Telehealth: Payer: Self-pay | Admitting: Adult Health Nurse Practitioner

## 2018-10-21 ENCOUNTER — Ambulatory Visit
Admission: RE | Admit: 2018-10-21 | Discharge: 2018-10-21 | Disposition: A | Discharge status: Home / Self Care | Payer: Medicaid Other | Source: Ambulatory Visit | Attending: Radiation Oncology | Admitting: Radiation Oncology

## 2018-10-21 ENCOUNTER — Ambulatory Visit: Payer: Medicaid Other

## 2018-10-21 DIAGNOSIS — C161 Malignant neoplasm of fundus of stomach: Secondary | ICD-10-CM

## 2018-10-21 DIAGNOSIS — C7931 Secondary malignant neoplasm of brain: Secondary | ICD-10-CM

## 2018-10-21 DIAGNOSIS — Z515 Encounter for palliative care: Secondary | ICD-10-CM

## 2018-10-21 NOTE — Telephone Encounter (Signed)
Spoke with sister, Ryan Collins, to set up appointment.  She is going to be with her brother today and set up appointment for 2:00 today. [name] K. Olena Heckle NP

## 2018-10-21 NOTE — Telephone Encounter (Signed)
Christie Nottingham, RN with [facility] called to advise [place] of patients vitals. She had a visit with patient today and vitals were BP-100/60, HR-123, and O2 72%. She states he is not in any distress, Lungs sound clear, and he is alert and oriented x 3. She states she will have someone recheck O2 tomorrow and will let us know. She also asked for Verbal orders for occupational Therapy and theses were granted and she will send over orders for Co-sign.  Patient was recently in hospital.  Cathy's number is [phone].

## 2018-10-21 NOTE — Telephone Encounter (Signed)
I contacted Patient to enroll him in the transportation services program and he did. Patient will be transported for his first ride for his appointment in 6/12

## 2018-10-21 NOTE — Progress Notes (Signed)
[facility] Consult Note Telephone: [phone]  Fax: [phone]  PATIENT NAME: Ryan Collins DOB: 28-Feb-1961 MRN: [id number]  PRIMARY CARE PROVIDER:   Lanae Boast, FNP  REFERRING PROVIDER:  Lanae Boast, [place], [place] [address]  RESPONSIBLE PARTY:   Self  [phone]                                              Clary Meeker, mother [phone]                                              Frisco Cordts, sister [phone]     RECOMMENDATIONS and PLAN:  1.  Gastric cancer.  Patient first diagnosed in January of 2019.  Had gastrectomy in 02/2018 and has had systemic chemotherapy.  Recent hospitalization, 6/1-10/17/2018, showed metastasis to the brain.  Patient is to have MRI and radiation later this week.  Notes in Epic show that he may not be a candidate for any further systemic treatment.    2.  Mobility.  Patient and family reports that prior to the hospitalization he was able to use walker to ambulate and preform ADLs with minimal assistance. Now he is weaker but is still able to get to bedside commode. He is still able to perform most ADLs independently but does require more set up assistance.  He is working with [facility] PT/OT, which starts this week.    3.  Goals of care.  Patient desires to work with therapy to get stronger.  Would like to get back to baseline.  Denies pain and states that his appetite is fine.  Mother does state that after the gastrectomy he can only eat small amounts and usually he eats soft foods.  Patient endorses he wants to be a full code and family having conversations with him about how traumatic CPR would be for him.  This will be an ongoing discussion.  He does endorse that he does not want to be on a ventilator.  Discussed with family that we would further discuss this topic in 2 weeks at our next meeting.    As patient is just now getting set up at home from being in the hospital, he  had no concerns of unmanaged symptoms and will see him again in 2 weeks to see if there are any changes after being home for a while.  Of note HH RN was there prior to my visit and was concerned about an O2 sat of 72%.  I checked his O2 sat and it was 99% on RA.  Patient does not use O2 at home.  I spent 45 minutes providing this consultation,  from 2:30 to 3:15. More than 50% of the time in this consultation was spent coordinating communication.   HISTORY OF PRESENT ILLNESS:  Ryan Collins is a 58 y.o. year old male with multiple medical problems including gastric cancer with mets to brain, HTN, h/o DVT and GI bleed. Palliative Care was asked to help address goals of care.   CODE STATUS: full code  PPS: 50%  This may increase as he regains his strength with therapy HOSPICE ELIGIBILITY/DIAGNOSIS: TBD  PHYSICAL EXAM:   General:patient lying in bed  in NAD, frail appearing, thin Cardiovascular: regular rate and rhythm Pulmonary: clear ant fields Extremities: no edema, no joint deformities Neurological: Weakness but otherwise nonfocal  PAST MEDICAL HISTORY:  Past Medical History:  Diagnosis Date  . Cancer [facility])    gastric cancer  . DVT (deep venous thrombosis) ([place]) 2011  . GI bleeding 2001  . History of DVT of lower extremity   . Hypertension   . Melena 05/2017  . Stomach ulcer    Clinically suspected; No EGD confirmation as of 06/03/17    SOCIAL HX:  Social History   Tobacco Use  . Smoking status: Never Smoker  . Smokeless tobacco: Never Used  Substance Use Topics  . Alcohol use: Yes    Alcohol/week: 5.0 standard drinks    Types: 5 Cans of beer per week    Comment: 05/2017 i QUIT DRINKING 4 MONTHS AGO "    ALLERGIES: No Known Allergies   PERTINENT MEDICATIONS:  Outpatient Encounter Medications as of 10/21/2018  Medication Sig  . acetaminophen (TYLENOL) 500 MG tablet Take 1,000 mg by mouth every 6 (six) hours as needed for mild pain.   . Amino Acids-Protein Hydrolys  (FEEDING SUPPLEMENT, PRO-STAT SUGAR FREE 64,) LIQD Place 30 mLs into feeding tube 3 (three) times daily.  Mar[place] Kitchen amLODipine (NORVASC) 2.5 MG tablet Take 1 tablet (2.5 mg total) by mouth daily.  . cyclobenzaprine (FLEXERIL) 10 MG tablet Take 1 tablet (10 mg total) by mouth 3 (three) times daily as needed for muscle spasms.  Mar[place] Kitchen dexamethasone (DECADRON) 4 MG tablet Take 1 tablet (4 mg total) by mouth 4 (four) times daily for 30 days.  Mar[place] Kitchen levETIRAcetam (KEPPRA) 1000 MG tablet Take 1 tablet (1,000 mg total) by mouth 2 (two) times daily.  Marland Kitchen lidocaine-prilocaine (EMLA) cream Apply 1 application topically as needed (for port).  . magic mouthwash w/lidocaine SOLN Take 5 mLs by mouth 4 (four) times daily as needed for mouth pain.  Marland Kitchen ondansetron (ZOFRAN) 8 MG tablet Take 8 mg by mouth every 8 (eight) hours as needed.  . potassium chloride SA (K-DUR,KLOR-CON) 20 MEQ tablet Take 1 tablet (20 mEq total) by mouth 2 (two) times daily.  . prochlorperazine (COMPAZINE) 10 MG tablet Take 10 mg by mouth every 6 (six) hours as needed.  . vitamin B-12 (CYANOCOBALAMIN) 1000 MCG tablet Take 1 tablet (1,000 mcg total) by mouth daily.   No facility-administered encounter medications on file as of 10/21/2018.       Amy Jenetta Downer, NP

## 2018-10-22 ENCOUNTER — Other Ambulatory Visit: Payer: Self-pay

## 2018-10-22 NOTE — Progress Notes (Signed)
Radiation Oncology         ([phone]) [phone] ________________________________  Initial Outpatient Consultation - Conducted via telephone due to current COVID-19 concerns for limiting patient exposure  I spoke with the patient to conduct this consult visit via telephone to spare the patient unnecessary potential exposure in the healthcare setting during the current COVID-19 pandemic. The patient was notified in advance and was offered a [place] meeting to allow for face to face communication but unfortunately reported that they did not have the appropriate resources/technology to support such a visit and instead preferred to proceed with a telephone consult.   ________________________________  Name: Ryan Collins        MRN: [id number]  Date of Service: 10/21/2018 DOB: [birth year]  NF:[id number], Venora Maples, FNP  Lanae Boast, FNP     REFERRING PHYSICIAN: Lanae Boast, FNP   DIAGNOSIS: The primary encounter diagnosis was Brain metastases [facility]). A diagnosis of Malignant neoplasm of fundus of stomach (HCC) was also pertinent to this visit.   HISTORY OF PRESENT ILLNESS: Ryan Collins is a 58 y.o. male seen at the request of Dr. Burr Medico for a history of gastric cancer with recently found brain metastases. Ryan Collins presented in January 2019 with blood loss anemia and was ultimately diagnosed in the hospital setting with an adenocarcinoma of the gastric fundus on EGD. He had staging imagine that confirmed extragastric extension with a peritoneal implant, and ascites, as well as possible splenic lesion. He began chemotherapy and had initially improvement of his extragastric disease and underwent diagnostic laparoscopy, liver biopsy, total gastrectomy with esophagojejunostomy en bloc with distal pancreatectomy/splenectomy, feeding jejunostomy tube with Dr. Barry Dienes. He had minimal therapeutic response of the primary tumor and disease in 8/37 nodes. His margins were negative. He's been on systemic therapy since and his  most recent regimen was his first cycle of Keytruda on 10/01/2018. He presented to the hospital on 10/13/2018 by EMS for unresponsiveness. Apparently family had been trying to get him to go to the hospital but he had been refusing. While being worked up a head CT revealed concerns for metastatic disease. He was started on Dexamethasone 4 mg q 6 hours. While it was initially thought he may have only a few deposits, an MRI on 10/17/2018 revealed 10 metastatic lesions, the largest measured 18 x 15 mm in the right cerebellum with mild edema. Other sites had similar mild to moderate edema. Today by phone, the patient is evaluated for consideration of palliative treatment to the brain. Of note he may be a candidate for systemic therapy, but this has not been finalized with Dr. Burr Medico.     PREVIOUS RADIATION THERAPY: No   PAST MEDICAL HISTORY:  Past Medical History:  Diagnosis Date   Cancer [facility])    gastric cancer   DVT (deep venous thrombosis) ([place]) 2011   GI bleeding 2001   History of DVT of lower extremity    Hypertension    Melena 05/2017   Stomach ulcer    Clinically suspected; No EGD confirmation as of 06/03/17       PAST SURGICAL HISTORY: Past Surgical History:  Procedure Laterality Date   ESOPHAGOGASTRODUODENOSCOPY  06/04/2017   ESOPHAGOGASTRODUODENOSCOPY N/A 06/04/2017   Procedure: ESOPHAGOGASTRODUODENOSCOPY (EGD);  Surgeon: Carol Ada, MD;  Location: [place];  Service: Endoscopy;  Laterality: N/A;   GASTRECTOMY N/A 02/25/2018   Procedure: TOTAL GASTRECTOMY WITH J TUBE PLACEMENT;  Surgeon: [name], MD;  Location: [place];  Service: General;  Laterality: N/A;   IR FLUORO GUIDE PORT  INSERTION RIGHT  06/20/2017   IR US GUIDE VASC ACCESS RIGHT  06/20/2017   JEJUNOSTOMY N/A 02/25/2018   Procedure: JEJUNOSTOMY TUBE;  Surgeon: [name], MD;  Location: [place];  Service: General;  Laterality: N/A;   LAPAROSCOPY N/A 02/25/2018   Procedure: LAPAROSCOPY DIAGNOSTIC;   Surgeon: [name], MD;  Location: [place];  Service: General;  Laterality: N/A;   SPLENECTOMY, TOTAL N/A 02/25/2018   Procedure: SPLENECTOMY;  Surgeon: [name], MD;  Location: [place];  Service: General;  Laterality: N/A;     FAMILY HISTORY:  Family History  Problem Relation Age of Onset   Hypertension Mother    Cancer Sister 1       breast     SOCIAL HISTORY:  reports that he has never smoked. He has never used smokeless tobacco. He reports current alcohol use of about 5.0 standard drinks of alcohol per week. He reports current drug use. Frequency: 2.00 times per week. Drug: Marijuana.   ALLERGIES: Patient has no known allergies.   MEDICATIONS:  Current Outpatient Medications  Medication Sig Dispense Refill   acetaminophen (TYLENOL) 500 MG tablet Take 1,000 mg by mouth every 6 (six) hours as needed for mild pain.      Amino Acids-Protein Hydrolys (FEEDING SUPPLEMENT, PRO-STAT SUGAR FREE 64,) LIQD Place 30 mLs into feeding tube 3 (three) times daily. 900 mL 0   amLODipine (NORVASC) 2.5 MG tablet Take 1 tablet (2.5 mg total) by mouth daily. 90 tablet 3   cyclobenzaprine (FLEXERIL) 10 MG tablet Take 1 tablet (10 mg total) by mouth 3 (three) times daily as needed for muscle spasms. 30 tablet 1   dexamethasone (DECADRON) 4 MG tablet Take 1 tablet (4 mg total) by mouth 4 (four) times daily for 30 days. 120 tablet 0   levETIRAcetam (KEPPRA) 1000 MG tablet Take 1 tablet (1,000 mg total) by mouth 2 (two) times daily. 60 tablet 0   lidocaine-prilocaine (EMLA) cream Apply 1 application topically as needed (for port). 30 g 2   magic mouthwash w/lidocaine SOLN Take 5 mLs by mouth 4 (four) times daily as needed for mouth pain. 240 mL 2   ondansetron (ZOFRAN) 8 MG tablet Take 8 mg by mouth every 8 (eight) hours as needed.     potassium chloride SA (K-DUR,KLOR-CON) 20 MEQ tablet Take 1 tablet (20 mEq total) by mouth 2 (two) times daily. 60 tablet 2   prochlorperazine  (COMPAZINE) 10 MG tablet Take 10 mg by mouth every 6 (six) hours as needed.     vitamin B-12 (CYANOCOBALAMIN) 1000 MCG tablet Take 1 tablet (1,000 mcg total) by mouth daily. 30 tablet 0   No current facility-administered medications for this encounter.      REVIEW OF SYSTEMS: On review of systems, the patient reports that he is doing well overall considering his disease. He has felt more steady and stable when walking. Apparently a fall preceeded his evaluation on the date of his admission. His family state he is more aware and not as confused as he had been. He is taking Dexamethasone 4 mg q 6 hours. He denies any chest pain, shortness of breath, cough, fevers, chills, night sweats, unintended weight changes. He denies any bowel or bladder disturbances, and denies abdominal pain, nausea or vomiting. He denies any new musculoskeletal or joint aches or pains. A complete review of systems is obtained and is otherwise negative.     PHYSICAL EXAM:  Unable to assess due to nature of encounter.   ECOG =  1  0 - Asymptomatic (Fully active, able to carry on all predisease activities without restriction)  1 - Symptomatic but completely ambulatory (Restricted in physically strenuous activity but ambulatory and able to carry out work of a light or sedentary nature. For example, light housework, office work)  2 - Symptomatic, <50% in bed during the day (Ambulatory and capable of all self care but unable to carry out any work activities. Up and about more than 50% of waking hours)  3 - Symptomatic, >50% in bed, but not bedbound (Capable of only limited self-care, confined to bed or chair 50% or more of waking hours)  4 - Bedbound (Completely disabled. Cannot carry on any self-care. Totally confined to bed or chair)  5 - Death   Eustace Pen MM, Creech RH, Tormey DC, et al. [phone]). "Toxicity and response criteria of the [facility]". [place] Oncol. 5 (6): 649-55    LABORATORY  DATA:  Lab Results  Component Value Date   WBC 10.4 10/18/2018   HGB 11.7 (L) 10/18/2018   HCT 33.5 (L) 10/18/2018   MCV 84.8 10/18/2018   PLT 299 10/18/2018   Lab Results  Component Value Date   NA 146 (H) 10/18/2018   K 3.9 10/18/2018   CL 111 10/18/2018   CO2 25 10/18/2018   Lab Results  Component Value Date   ALT QUANTITY NOT SUFFICIENT, UNABLE TO PERFORM TEST 10/13/2018   AST 33 10/13/2018   ALKPHOS 53 10/13/2018   BILITOT 0.8 10/13/2018      RADIOGRAPHY: Ct Head Wo Contrast  Result Date: 10/14/2018 CLINICAL DATA:  Stroke follow-up EXAM: CT HEAD WITHOUT CONTRAST TECHNIQUE: Contiguous axial images were obtained from the base of the skull through the vertex without intravenous contrast. COMPARISON:  None. FINDINGS: Brain: Unchanged size and distribution of multiple hemorrhagic lesions scattered throughout both cerebral hemispheres, the largest of which is in the left temporal lobe. The surrounding vasogenic edema patterns are unchanged. Size and configuration of the CSF spaces are also unchanged. Thickened appearance of the pituitary infundibulum. Vascular: No abnormal hyperdensity of the major intracranial arteries or dural venous sinuses. No intracranial atherosclerosis. Skull: The visualized skull base, calvarium and extracranial soft tissues are normal. Sinuses/Orbits: No fluid levels or advanced mucosal thickening of the visualized paranasal sinuses. No mastoid or middle ear effusion. The orbits are normal. IMPRESSION: 1. Unchanged size and configuration of multiple hemorrhagic lesions scattered throughout both hemispheres (approximately 5). Unchanged edema pattern. MRI of the brain with and without contrast is recommended. 2. Thickened appearance of the pituitary infundibulum may also indicate metastatic disease. Attention recommended on MRI. Electronically Signed   By: Ulyses Jarred M.D.   On: 10/14/2018 03:51   Mr Jeri Cos IB Contrast  Result Date: 10/18/2018 CLINICAL DATA:   Intracranial metastatic disease. Planning for stereotactic radio surgery. EXAM: MRI HEAD WITHOUT AND WITH CONTRAST TECHNIQUE: Multiplanar, multiecho pulse sequences of the brain and surrounding structures were obtained without and with intravenous contrast. CONTRAST:  5 mL Gadavist COMPARISON:  Head CT 10/13/2018, 10/14/2018 FINDINGS: BRAIN: There is no acute ischemic infarct. No midline shift or hydrocephalus. Atrophy is greater than expected for age. Susceptibility weighted imaging is degraded by motion but there is no large hematoma. There are multiple intraparenchymal masses: 1. Right cerebellum, 18 x 15 mm, mild surrounding edema. Series 13, image 56 2. Base of the pituitary infundibulum, 8 x 8 mm.  Image 90 3. Left temporal lobe, 18 x 17 mm, moderate edema.  Image 100  4. Left occipital lobe, 3 mm, no edema.  Image 106 5. Posterior left insula, 14 x 13 mm, mild edema.  Image 112 6. Right frontal posterior white matter 10 x 11 mm, moderate edema. Image 117 7. Left frontal lobe, 14 x 12 mm, moderate surrounding edema. Image 132 8. Subcortical posterior right frontal lobe 14 x 10 mm, mild surrounding edema. Image 141 9. Superior left frontal lobe, 15 x 13 mm, moderate edema. Image 153 10. Superior right frontal lobe, 10 x 10 mm, mild edema.  Image 141 VASCULAR: The major intracranial arterial and venous sinus flow voids are normal. SKULL AND UPPER CERVICAL SPINE: Calvarial bone marrow signal is normal. There is no skull base mass. Visualized upper cervical spine and soft tissues are normal. SINUSES/ORBITS: No fluid levels or advanced mucosal thickening. No mastoid or middle ear effusion. The orbits are normal. IMPRESSION: 1. At least 10 intraparenchymal metastases, the largest of which is located in the left temporal lobe and measures 18 x 17 mm. Multiple lesions have moderate surrounding edema. 2. No herniation, midline shift or hydrocephalus. 3. The lesions are annotated on series 13. Electronically Signed   By:  Ulyses Jarred M.D.   On: 10/18/2018 00:06   Dg Chest Port 1 View  Result Date: 10/14/2018 CLINICAL DATA:  History of stomach carcinoma, check tube placement EXAM: PORTABLE CHEST 1 VIEW COMPARISON:  10/13/2018, CT from 09/17/2010 FINDINGS: Cardiac shadows within normal limits. Endotracheal tube, gastric catheter right chest wall port are noted. Gastric catheter is noted coiled upon itself within the distal esophagus. Multiple nodular changes are noted in both lungs consistent with metastatic disease. No acute bony abnormality is noted. IMPRESSION: Stable pulmonary metastasis. Gastric catheter coiled upon itself within the distal esophagus. Electronically Signed   By: Inez Catalina M.D.   On: 10/14/2018 07:37   Dg Chest Portable 1 View  Result Date: 10/13/2018 CLINICAL DATA:  Gastric cancer.  Patient intubated. EXAM: PORTABLE CHEST 1 VIEW COMPARISON:  CT scan Sep 17, 2018 FINDINGS: Pulmonary nodularity was better assessed on the previous CT scan and is new since July 10, 2017, consistent with patient's known pulmonary metastases. An ETT terminates in good position. A right Port-A-Cath terminates in the central SVC. No pneumothorax. Skin folds over the upper right chest. No acute infiltrate. IMPRESSION: 1. Support apparatus as above. 2. Known pulmonary metastases. Electronically Signed   By: Dorise Bullion III M.D   On: 10/13/2018 19:46   Dg Abd Portable 1v  Result Date: 10/14/2018 CLINICAL DATA:  Check gastric catheter placement EXAM: PORTABLE ABDOMEN - 1 VIEW COMPARISON:  None. FINDINGS: Gastric catheter is noted coiled within the distal esophagus. This should be withdrawn and readvanced into the stomach. Scattered large and small bowel gas is noted. IMPRESSION: Gastric catheter coiled within the distal esophagus. Electronically Signed   By: Inez Catalina M.D.   On: 10/14/2018 07:35   Ct Head Code Stroke Wo Contrast`  Result Date: 10/13/2018 CLINICAL DATA:  Code stroke.  Altered mental status EXAM: CT  HEAD WITHOUT CONTRAST TECHNIQUE: Contiguous axial images were obtained from the base of the skull through the vertex without intravenous contrast. COMPARISON:  None. FINDINGS: Brain: There are multiple hemorrhagic lesions, the largest of which is located in the left temporal lobe and measures 16 mm. There are at least 5 lesions. No infratentorial lesions are visible. There is no midline shift or other mass effect. Moderate edema surrounds all lesions. No hydrocephalus. Vascular: Atherosclerotic calcification of the internal carotid arteries at  the skull base. No abnormal hyperdensity of the major intracranial arteries or dural venous sinuses. Skull: The visualized skull base, calvarium and extracranial soft tissues are normal. Sinuses/Orbits: No fluid levels or advanced mucosal thickening of the visualized paranasal sinuses. No mastoid or middle ear effusion. The orbits are normal. IMPRESSION: 1. At least 5 hyperdense metastases with surrounding edema, greatest in the left temporal lobe. Small volume hemorrhage is associated with most of the lesions. 2. ASPECTS is not applicable in the setting of hemorrhage These results were communicated to Dr. Roland Rack at 8:02 pm on 10/13/2018 by text page via the [facility] messaging system. Electronically Signed   By: Ulyses Jarred M.D.   On: 10/13/2018 20:06       IMPRESSION/PLAN: 1. Progressive Metastatic Adenocarcinoma of the Gastic Fundus with Brain Metastases. Dr. Lisbeth Renshaw has reviewed the patient's case. Unfortunately he has too many lesions to consider stereotactic treatment options. Dr. Lisbeth Renshaw recommends offering a course of palliative treatment to the brain, and would consider a 5 fraction course. I spent time with the patient and his family by phone to discuss the risks, benefits, short, and long effects of radiotherapy. We reviewed the delivery and logistics of therapy and the rationale to consider this form of therapy even if he is not a candidate to have  additional systemic therapy to help with quality of life. He will continue dexamethasone at his current dosing. We will make taper instructions at the conclusion of his treatment. The patient and his sister give verbal consent to proceed, and formal documentation of this will occur on Friday when he comes for simulation. We anticipate treatment to begin mid week next week. 2. GI prophylaxis. We discussed the importance of taking a PPI for reducing the risks of gastric erosion from taking dexamethasone, and risks of thrush. We will follow this expectantly.   Given current concerns for patient exposure during the COVID-19 pandemic, this encounter was conducted via telephone.  The patient has given verbal consent for this type of encounter. The time spent during this encounter was 45 minutes and 50% of that time was spent in the coordination of his care. The attendants for this meeting include Shona Simpson, [facility] and [name], his mother Clarion Mooneyhan and sister Aithan Farrelly. During the encounter, Shona Simpson [facility] was located remotely at home.  [name], his mother and sister, were located at home.    Carola Rhine, PAC

## 2018-10-23 ENCOUNTER — Inpatient Hospital Stay: Payer: Medicaid Other | Attending: Hematology | Admitting: Nutrition

## 2018-10-23 NOTE — Progress Notes (Signed)
RD working remotely.  Brief nutrition follow-up completed with patient's mother.  Patient will be coming to [facility].  He is using our transportation service. He is out of his Ensure Enlive and would like an additional case to pick up tomorrow. Patient's mother reports he is eating a little bit at a time. Noted weight decreased to 112 pounds on June 6. No questions or concerns at this time. They have my contact information for any questions or concerns in the future.

## 2018-10-24 ENCOUNTER — Telehealth: Payer: Self-pay | Admitting: Family Medicine

## 2018-10-24 ENCOUNTER — Ambulatory Visit: Payer: Medicaid Other | Admitting: Radiation Oncology

## 2018-10-24 ENCOUNTER — Inpatient Hospital Stay: Payer: Medicaid Other

## 2018-10-24 ENCOUNTER — Emergency Department (HOSPITAL_COMMUNITY): Payer: Medicaid Other

## 2018-10-24 ENCOUNTER — Inpatient Hospital Stay (HOSPITAL_COMMUNITY)
Admission: EM | Admit: 2018-10-24 | Discharge: 2018-11-12 | DRG: 175 | Disposition: E | Payer: Medicaid Other | Attending: Internal Medicine | Admitting: Internal Medicine

## 2018-10-24 ENCOUNTER — Other Ambulatory Visit: Payer: Self-pay

## 2018-10-24 ENCOUNTER — Inpatient Hospital Stay: Payer: Medicaid Other | Admitting: Hematology

## 2018-10-24 DIAGNOSIS — I82499 Acute embolism and thrombosis of other specified deep vein of unspecified lower extremity: Secondary | ICD-10-CM | POA: Diagnosis not present

## 2018-10-24 DIAGNOSIS — I2693 Single subsegmental pulmonary embolism without acute cor pulmonale: Secondary | ICD-10-CM | POA: Diagnosis present

## 2018-10-24 DIAGNOSIS — E43 Unspecified severe protein-calorie malnutrition: Secondary | ICD-10-CM | POA: Diagnosis present

## 2018-10-24 DIAGNOSIS — R042 Hemoptysis: Secondary | ICD-10-CM | POA: Diagnosis not present

## 2018-10-24 DIAGNOSIS — Z86718 Personal history of other venous thrombosis and embolism: Secondary | ICD-10-CM

## 2018-10-24 DIAGNOSIS — Z681 Body mass index (BMI) 19 or less, adult: Secondary | ICD-10-CM | POA: Diagnosis not present

## 2018-10-24 DIAGNOSIS — I959 Hypotension, unspecified: Secondary | ICD-10-CM | POA: Diagnosis not present

## 2018-10-24 DIAGNOSIS — Z9081 Acquired absence of spleen: Secondary | ICD-10-CM

## 2018-10-24 DIAGNOSIS — I10 Essential (primary) hypertension: Secondary | ICD-10-CM | POA: Diagnosis present

## 2018-10-24 DIAGNOSIS — R51 Headache: Secondary | ICD-10-CM | POA: Diagnosis present

## 2018-10-24 DIAGNOSIS — R0602 Shortness of breath: Secondary | ICD-10-CM | POA: Diagnosis present

## 2018-10-24 DIAGNOSIS — R64 Cachexia: Secondary | ICD-10-CM | POA: Diagnosis present

## 2018-10-24 DIAGNOSIS — C7800 Secondary malignant neoplasm of unspecified lung: Secondary | ICD-10-CM | POA: Diagnosis present

## 2018-10-24 DIAGNOSIS — G936 Cerebral edema: Secondary | ICD-10-CM | POA: Diagnosis present

## 2018-10-24 DIAGNOSIS — Z515 Encounter for palliative care: Secondary | ICD-10-CM | POA: Diagnosis present

## 2018-10-24 DIAGNOSIS — G40909 Epilepsy, unspecified, not intractable, without status epilepticus: Secondary | ICD-10-CM | POA: Diagnosis present

## 2018-10-24 DIAGNOSIS — C7931 Secondary malignant neoplasm of brain: Secondary | ICD-10-CM | POA: Diagnosis present

## 2018-10-24 DIAGNOSIS — Z1159 Encounter for screening for other viral diseases: Secondary | ICD-10-CM

## 2018-10-24 DIAGNOSIS — Z66 Do not resuscitate: Secondary | ICD-10-CM | POA: Diagnosis present

## 2018-10-24 DIAGNOSIS — C169 Malignant neoplasm of stomach, unspecified: Secondary | ICD-10-CM | POA: Diagnosis not present

## 2018-10-24 DIAGNOSIS — M6250 Muscle wasting and atrophy, not elsewhere classified, unspecified site: Secondary | ICD-10-CM | POA: Diagnosis present

## 2018-10-24 DIAGNOSIS — R531 Weakness: Secondary | ICD-10-CM

## 2018-10-24 DIAGNOSIS — I2699 Other pulmonary embolism without acute cor pulmonale: Secondary | ICD-10-CM | POA: Diagnosis present

## 2018-10-24 DIAGNOSIS — I2602 Saddle embolus of pulmonary artery with acute cor pulmonale: Secondary | ICD-10-CM

## 2018-10-24 DIAGNOSIS — E538 Deficiency of other specified B group vitamins: Secondary | ICD-10-CM

## 2018-10-24 LAB — URINALYSIS, ROUTINE W REFLEX MICROSCOPIC
Bilirubin Urine: NEGATIVE
Glucose, UA: NEGATIVE mg/dL
Hgb urine dipstick: NEGATIVE
Ketones, ur: NEGATIVE mg/dL
Leukocytes,Ua: NEGATIVE
Nitrite: NEGATIVE
Protein, ur: NEGATIVE mg/dL
Specific Gravity, Urine: 1.024 (ref 1.005–1.030)
pH: 6 (ref 5.0–8.0)

## 2018-10-24 LAB — CBC
HCT: 37.7 % — ABNORMAL LOW (ref 39.0–52.0)
Hemoglobin: 12.5 g/dL — ABNORMAL LOW (ref 13.0–17.0)
MCH: 29.6 pg (ref 26.0–34.0)
MCHC: 33.2 g/dL (ref 30.0–36.0)
MCV: 89.1 fL (ref 80.0–100.0)
Platelets: 246 10*3/uL (ref 150–400)
RBC: 4.23 MIL/uL (ref 4.22–5.81)
RDW: 21.7 % — ABNORMAL HIGH (ref 11.5–15.5)
WBC: 9.9 10*3/uL (ref 4.0–10.5)
nRBC: 0 % (ref 0.0–0.2)

## 2018-10-24 LAB — COMPREHENSIVE METABOLIC PANEL
ALT: 17 U/L (ref 0–44)
AST: 21 U/L (ref 15–41)
Albumin: 2.9 g/dL — ABNORMAL LOW (ref 3.5–5.0)
Alkaline Phosphatase: 50 U/L (ref 38–126)
Anion gap: 5 (ref 5–15)
BUN: 19 mg/dL (ref 6–20)
CO2: 26 mmol/L (ref 22–32)
Calcium: 8.7 mg/dL — ABNORMAL LOW (ref 8.9–10.3)
Chloride: 115 mmol/L — ABNORMAL HIGH (ref 98–111)
Creatinine, Ser: 0.47 mg/dL — ABNORMAL LOW (ref 0.61–1.24)
GFR calc Af Amer: >60 mL/min (ref >60)
GFR calc non Af Amer: >60 mL/min (ref >60)
Glucose, Bld: 88 mg/dL (ref 70–99)
Potassium: 4.1 mmol/L (ref 3.5–5.1)
Sodium: 146 mmol/L — ABNORMAL HIGH (ref 135–145)
Total Bilirubin: 0.6 mg/dL (ref 0.3–1.2)
Total Protein: 6 g/dL — ABNORMAL LOW (ref 6.5–8.1)

## 2018-10-24 LAB — LACTIC ACID, PLASMA: Lactic Acid, Venous: 1.4 mmol/L (ref 0.5–1.9)

## 2018-10-24 LAB — SARS CORONAVIRUS 2 BY RT PCR (HOSPITAL ORDER, PERFORMED IN ~~LOC~~ HOSPITAL LAB): SARS Coronavirus 2: NEGATIVE

## 2018-10-24 LAB — TROPONIN I: Troponin I: <0.03 ng/mL (ref <0.03)

## 2018-10-24 LAB — PROTIME-INR
INR: 1.1 (ref 0.8–1.2)
Prothrombin Time: 14.5 seconds (ref 11.4–15.2)

## 2018-10-24 MED ORDER — SODIUM CHLORIDE 0.9 % IV SOLN
INTRAVENOUS | Status: DC
  Administered 2018-10-24 – 2018-10-25 (×3): via INTRAVENOUS

## 2018-10-24 MED ORDER — HEPARIN (PORCINE) 25000 UT/250ML-% IV SOLN
900.0000 [IU]/h | INTRAVENOUS | Status: DC
  Administered 2018-10-24: 900 [IU]/h via INTRAVENOUS
  Filled 2018-10-24: qty 250

## 2018-10-24 MED ORDER — LIDOCAINE-PRILOCAINE 2.5-2.5 % EX CREA: 1.0000 "application " | TOPICAL_CREAM | CUTANEOUS | Status: DC | PRN

## 2018-10-24 MED ORDER — MAGIC MOUTHWASH W/LIDOCAINE
5.0000 mL | Freq: Four times a day (QID) | ORAL | Status: DC | PRN
  Filled 2018-10-24: qty 5

## 2018-10-24 MED ORDER — VITAMIN B-12 1000 MCG PO TABS
1000.0000 ug | ORAL_TABLET | Freq: Every day | ORAL | Status: DC
  Administered 2018-10-25: 1000 ug via ORAL
  Filled 2018-10-24: qty 1

## 2018-10-24 MED ORDER — PROCHLORPERAZINE MALEATE 10 MG PO TABS: 10.0000 mg | ORAL_TABLET | Freq: Four times a day (QID) | ORAL | Status: DC | PRN

## 2018-10-24 MED ORDER — PRO-STAT SUGAR FREE PO LIQD
30.0000 mL | Freq: Three times a day (TID) | ORAL | Status: DC
  Administered 2018-10-25: 30 mL
  Filled 2018-10-24 (×3): qty 30

## 2018-10-24 MED ORDER — SODIUM CHLORIDE 0.9 % IV BOLUS
1000.0000 mL | Freq: Once | INTRAVENOUS | Status: AC
  Administered 2018-10-24: 1000 mL via INTRAVENOUS

## 2018-10-24 MED ORDER — DEXAMETHASONE 2 MG PO TABS
4.0000 mg | ORAL_TABLET | Freq: Four times a day (QID) | ORAL | Status: DC
  Administered 2018-10-24 – 2018-10-25 (×5): 4 mg via ORAL
  Filled 2018-10-24 (×3): qty 1
  Filled 2018-10-24: qty 2
  Filled 2018-10-24: qty 1

## 2018-10-24 MED ORDER — IOHEXOL 350 MG/ML SOLN
100.0000 mL | Freq: Once | INTRAVENOUS | Status: AC | PRN
  Administered 2018-10-24: 80 mL via INTRAVENOUS

## 2018-10-24 MED ORDER — AMLODIPINE BESYLATE 5 MG PO TABS
2.5000 mg | ORAL_TABLET | Freq: Every day | ORAL | Status: DC
  Administered 2018-10-25: 2.5 mg via ORAL
  Filled 2018-10-24: qty 1

## 2018-10-24 MED ORDER — ONDANSETRON HCL 4 MG PO TABS: 8.0000 mg | ORAL_TABLET | Freq: Three times a day (TID) | ORAL | Status: DC | PRN

## 2018-10-24 MED ORDER — LEVETIRACETAM 500 MG PO TABS
1000.0000 mg | ORAL_TABLET | Freq: Two times a day (BID) | ORAL | Status: DC
  Administered 2018-10-24 – 2018-10-25 (×3): 1000 mg via ORAL
  Filled 2018-10-24 (×3): qty 2

## 2018-10-24 MED ORDER — ACETAMINOPHEN 500 MG PO TABS
1000.0000 mg | ORAL_TABLET | Freq: Four times a day (QID) | ORAL | Status: DC | PRN
  Administered 2018-10-24 – 2018-10-26 (×3): 1000 mg via ORAL
  Filled 2018-10-24 (×3): qty 2

## 2018-10-24 MED ORDER — POTASSIUM CHLORIDE CRYS ER 20 MEQ PO TBCR
20.0000 meq | EXTENDED_RELEASE_TABLET | Freq: Two times a day (BID) | ORAL | Status: DC
  Administered 2018-10-24 – 2018-10-25 (×3): 20 meq via ORAL
  Filled 2018-10-24 (×3): qty 1

## 2018-10-24 MED ORDER — SODIUM CHLORIDE (PF) 0.9 % IJ SOLN
INTRAMUSCULAR | Status: AC
  Filled 2018-10-24: qty 50

## 2018-10-24 MED ORDER — CYCLOBENZAPRINE HCL 10 MG PO TABS: 10.0000 mg | ORAL_TABLET | Freq: Three times a day (TID) | ORAL | Status: DC | PRN

## 2018-10-24 MED ORDER — HEPARIN BOLUS VIA INFUSION
3500.0000 [IU] | Freq: Once | INTRAVENOUS | Status: AC
  Administered 2018-10-24: 3500 [IU] via INTRAVENOUS
  Filled 2018-10-24: qty 3500

## 2018-10-24 NOTE — ED Provider Notes (Signed)
Severe comorbid illness, main complaint weakness and increased SOB. F/u UA and CT scans. Dispo pending results and reassess.   Dr. Sedonia Small has spoken with family regarding work up. Physical Exam  BP 116/88   Pulse (!) 107   Temp 97.6 F (36.4 C) (Oral)   Resp 13   Ht 5\' 11"  (1.803 m)   Wt 50.9 kg   SpO2 100%   BMI 15.65 kg/m   Physical Exam  ED Course/Procedures     Procedures  MDM  CT positive for PE without right heart strain.  Patient has very complicated medical history.  He will be started on heparin and admitted.       Charlesetta Shanks, MD 10/23/2018 [phone]

## 2018-10-24 NOTE — ED Notes (Signed)
Monitor SpO2 would not read despite many different attempts and locations of pulse ox.  Used a portable pulse oximeter to obtain reading from pulse ox sensor on finger.  Portable pulse oximeter will be left in the room for routine checks.

## 2018-10-24 NOTE — ED Notes (Signed)
Bed: LO31 Expected date:  Expected time:  Means of arrival:  Comments: EMS-weakness/cancer patient

## 2018-10-24 NOTE — Progress Notes (Signed)
[place] for IV heparin Indication: PE  No Known Allergies  Patient Measurements: Height: 5\' 11"  (180.3 cm) Weight: 112 lb 3.4 oz (50.9 kg) IBW/kg (Calculated) : 75.3 Heparin Dosing Weight: TBW  Vital Signs: Temp: 97.6 F (36.4 C) (06/12 1250) Temp Source: Oral (06/12 1250) BP: 119/95 (06/12 1530) Pulse Rate: 107 (06/12 1250)  Labs: Recent Labs    10/17/2018 1408  HGB 12.5*  HCT 37.7*  PLT 246  CREATININE 0.47*  TROPONINI <0.03    Estimated Creatinine Clearance: 73.3 mL/min (A) (by C-G formula based on SCr of 0.47 mg/dL (L)).   Medical History: Past Medical History:  Diagnosis Date  . Cancer [facility])    gastric cancer  . DVT (deep venous thrombosis) ([place]) 2011  . GI bleeding 2001  . History of DVT of lower extremity   . Hypertension   . Melena 05/2017  . Stomach ulcer    Clinically suspected; No EGD confirmation as of 06/03/17    Medications:  (Not in a hospital admission)  Scheduled:  . heparin  3,500 Units Intravenous Once  . sodium chloride (PF)        Assessment: 42 yoM with PMH gastric CA with brain/lung mets, Hx DVT, presenting with SOB; found to have bilateral PE w/o R heart strain. Pharmacy to dose IV heparin   Baseline INR, aPTT: not done  Prior anticoagulation: none  Significant events:  Today, 10/16/2018:  CBC: Hgb slightly low; Plt wnl  No bleeding or infusion issues per nursing  Goal of Therapy: Heparin level 0.3-0.7 units/ml Monitor platelets by anticoagulation protocol: Yes  Plan:  Heparin 3500 units IV bolus x 1  Heparin 900 units/hr IV infusion  Check heparin level 6 hrs after start  Daily CBC, daily heparin level once stable  Monitor for signs of bleeding or thrombosis   Reuel Boom, PharmD, BCPS [phone] 11/09/2018, 4:57 PM

## 2018-10-24 NOTE — ED Triage Notes (Signed)
Pt BIBA from home, c/o generalized weakness since Friday, worsening last night.  Pt reports falling on Friday, was seen at [facility].  Denies n/v, denies SHOB, denies pain.

## 2018-10-24 NOTE — ED Notes (Signed)
Port accessed using sterile technique.  Flushed easily and got good blood return.

## 2018-10-24 NOTE — ED Provider Notes (Signed)
[facility] Emergency Department Provider Note MRN:  [id number]  Arrival date & time: 11/11/2018     Chief Complaint   Weakness and Shortness of Breath   History of Present Illness   Ryan Collins is a 58 y.o. year-old male with a history of gastric cancer with metastasis to the brain, DVT presenting to the ED with chief complaint of shortness of breath.  2 days of shortness of breath, decreased energy, not wanting to get out of bed.  Patient denies fever, no cough, no chest pain, no abdominal pain, no dysuria.  Endorsing dull frontal headache, which she experiences every day, not different today.  Symptoms constant, no exacerbating/relieving factors.  Family reports decreased p.o. intake for the past few days.  Review of Systems  A complete 10 system review of systems was obtained and all systems are negative except as noted in the HPI and PMH.   Patient's Health History    Past Medical History:  Diagnosis Date  . Cancer [facility])    gastric cancer  . DVT (deep venous thrombosis) ([place]) 2011  . GI bleeding 2001  . History of DVT of lower extremity   . Hypertension   . Melena 05/2017  . Stomach ulcer    Clinically suspected; No EGD confirmation as of 06/03/17    Past Surgical History:  Procedure Laterality Date  . ESOPHAGOGASTRODUODENOSCOPY  06/04/2017  . ESOPHAGOGASTRODUODENOSCOPY N/A 06/04/2017   Procedure: ESOPHAGOGASTRODUODENOSCOPY (EGD);  Surgeon: Carol Ada, MD;  Location: [place];  Service: Endoscopy;  Laterality: N/A;  . GASTRECTOMY N/A 02/25/2018   Procedure: TOTAL GASTRECTOMY WITH J TUBE PLACEMENT;  Surgeon: [name], MD;  Location: [place];  Service: General;  Laterality: N/A;  . IR FLUORO GUIDE PORT INSERTION RIGHT  06/20/2017  . IR US GUIDE VASC ACCESS RIGHT  06/20/2017  . JEJUNOSTOMY N/A 02/25/2018   Procedure: JEJUNOSTOMY TUBE;  Surgeon: [name], MD;  Location: [place];  Service: General;  Laterality: N/A;  . LAPAROSCOPY N/A 02/25/2018   Procedure: LAPAROSCOPY DIAGNOSTIC;  Surgeon: [name], MD;  Location: [place];  Service: General;  Laterality: N/A;  . SPLENECTOMY, TOTAL N/A 02/25/2018   Procedure: SPLENECTOMY;  Surgeon: [name], MD;  Location: [place];  Service: General;  Laterality: N/A;    Family History  Problem Relation Age of Onset  . Hypertension Mother   . Cancer Sister 28       breast    Social History   Socioeconomic History  . Marital status: Legally Separated    Spouse name: Not on file  . Number of children: Not on file  . Years of education: Not on file  . Highest education level: Not on file  Occupational History  . Not on file  Social Needs  . Financial resource strain: Not on file  . Food insecurity    Worry: Not on file    Inability: Not on file  . Transportation needs    Medical: Not on file    Non-medical: Not on file  Tobacco Use  . Smoking status: Never Smoker  . Smokeless tobacco: Never Used  Substance and Sexual Activity  . Alcohol use: Yes    Alcohol/week: 5.0 standard drinks    Types: 5 Cans of beer per week    Comment: 05/2017 i QUIT DRINKING 4 MONTHS AGO "  . Drug use: Yes    Frequency: 2.0 times per week    Types: Marijuana    Comment: last use: 02/17/2018  . Sexual  activity: Not on file  Lifestyle  . Physical activity    Days per week: Not on file    Minutes per session: Not on file  . Stress: Not on file  Relationships  . Social Herbalist on phone: Not on file    Gets together: Not on file    Attends religious service: Not on file    Active member of club or organization: Not on file    Attends meetings of clubs or organizations: Not on file    Relationship status: Not on file  . Intimate partner violence    Fear of current or ex partner: Not on file    Emotionally abused: Not on file    Physically abused: Not on file    Forced sexual activity: Not on file  Other Topics Concern  . Not on file  Social History Narrative  . Not on file      Physical Exam  Vital Signs and Nursing Notes reviewed Vitals:   11/01/2018 1300 11/11/2018 1330  BP: (!) 115/98 116/88  Pulse:    Resp: 12 13  Temp:    SpO2:      CONSTITUTIONAL: Chronically ill-appearing, NAD, thin NEURO:  Alert and oriented x 3, no focal deficits EYES:  eyes equal and reactive ENT/NECK:  no LAD, no JVD CARDIO: Regular rate, well-perfused, normal S1 and S2 PULM:  CTAB no wheezing or rhonchi GI/GU:  normal bowel sounds, non-distended, non-tender MSK/SPINE:  No gross deformities, no edema SKIN:  no rash, atraumatic PSYCH:  Appropriate speech and behavior  Diagnostic and Interventional Summary    EKG Interpretation  Date/Time:  Friday October 24 2018 14:13:35 EDT Ventricular Rate:  89 PR Interval:    QRS Duration: 91 QT Interval:  385 QTC Calculation: 469 R Axis:   43 Text Interpretation:  Sinus rhythm Ventricular premature complex Abnormal R-wave progression, early transition Borderline abnrm T, anterolateral leads Baseline wander in lead(s) V3 Confirmed by Gerlene Fee [phone]) on 10/23/2018 2:49:09 PM      Labs Reviewed  CBC - Abnormal; Notable for the following components:      Result Value   Hemoglobin 12.5 (*)    HCT 37.7 (*)    RDW 21.7 (*)    All other components within normal limits  COMPREHENSIVE METABOLIC PANEL - Abnormal; Notable for the following components:   Sodium 146 (*)    Chloride 115 (*)    Creatinine, Ser 0.47 (*)    Calcium 8.7 (*)    Total Protein 6.0 (*)    Albumin 2.9 (*)    All other components within normal limits  TROPONIN I  LACTIC ACID, PLASMA  URINALYSIS, ROUTINE W REFLEX MICROSCOPIC    DG Chest 2 View  Final Result    CT ANGIO CHEST PE W OR WO CONTRAST    (Results Pending)  CT Head Wo Contrast    (Results Pending)    Medications  sodium chloride 0.9 % bolus 1,000 mL (1,000 mLs Intravenous New Bag/Given 11/05/2018 1407)     Procedures Critical Care  ED Course and Medical Decision Making  I have reviewed the  triage vital signs and the nursing notes.  Pertinent labs & imaging results that were available during my care of the patient were reviewed by me and considered in my medical decision making (see below for details).  Considering metabolic disarray, AKI, PE in this 58 year old male here with tachycardia, mild tachypnea, active malignancy, shortness of breath.  Patient also has  a history of DVT.  No anticoagulation use listed currently.  Awaiting labs, CT.  Labs are overall reassuring or near baseline, still awaiting urinalysis and CT imaging.  I have spoken with family and prepped them for the possibility that we do not find a reason to admit this patient, and they were hesitant but open to bringing him back home to care for him.  They have home health support at home as well.  Signed out to oncoming provider at shift change.  Barth Kirks. Sedonia Small, [place] [email or web address]  Final Clinical Impressions(s) / ED Diagnoses     ICD-10-CM   1. Weakness  R53.1   2. SOB (shortness of breath)  R06.02 DG Chest 2 View    DG Chest 2 View    ED Discharge Orders    None         Maudie Flakes, MD 10/28/2018 [phone]

## 2018-10-24 NOTE — ED Notes (Signed)
ED TO INPATIENT HANDOFF REPORT  Name/Age/Gender Ryan Collins 58 y.o. male  Code Status    Code Status Orders  (From admission, onward)         Start     Ordered   10/23/2018 1724  Full code  Continuous     10/25/2018 1726        Code Status History    Date Active Date Inactive Code Status Order ID Comments User Context   10/13/2018 2112 10/18/2018 2000 Full Code [id number]  Shellia Cleverly, MD ED   02/25/2018 1451 03/06/2018 2009 Full Code [id number]  [name], MD Inpatient   06/03/2017 2144 06/06/2017 2102 Full Code [id number]  Dessa Phi, DO ED   Advance Care Planning Activity      Home/SNF/Other Home  Chief Complaint ca pt weakness  Level of Care/Admitting Diagnosis ED Disposition    ED Disposition Condition [facility] Area: [facility] [[id number]]  Level of Care: Med-Surg [16]  Covid Evaluation: Screening Protocol (No Symptoms)  Diagnosis: Pulmonary embolus [facility]) [[id number]]  Admitting Physician: Monna Fam  Attending Physician: Benny Lennert, AVA [place]  Estimated length of stay: 3 - 4 days  Certification:: I certify this patient will need inpatient services for at least 2 midnights  PT Class (Do Not Modify): Inpatient [101]  PT Acc Code (Do Not Modify): Private [1]       Medical History Past Medical History:  Diagnosis Date  . Cancer [facility])    gastric cancer  . DVT (deep venous thrombosis) ([place]) 2011  . GI bleeding 2001  . History of DVT of lower extremity   . Hypertension   . Melena 05/2017  . Stomach ulcer    Clinically suspected; No EGD confirmation as of 06/03/17    Allergies No Known Allergies  IV Location/Drains/Wounds Patient Lines/Drains/Airways Status   Active Line/Drains/Airways    Name:   Placement date:   Placement time:   Site:   Days:   Implanted Port 06/19/17   06/19/17    0929    -   492   Implanted Port Right Chest   -    -    Chest      External Urinary Catheter   10/14/18    1727    -   10    Incision (Closed) 02/25/18 Abdomen Other (Comment)   02/25/18    0949     241   Incision - 3 Ports Abdomen 1: Lateral;Lower 2: Mid;Umbilicus 3: Left;Upper;Mid   02/25/18    0910     241          Labs/Imaging Results for orders placed or performed during the hospital encounter of 10/17/2018 (from the past 48 hour(s))  CBC     Status: Abnormal   Collection Time: 11/01/2018  2:08 PM  Result Value Ref Range   WBC 9.9 4.0 - 10.5 K/uL   RBC 4.23 4.22 - 5.81 MIL/uL   Hemoglobin 12.5 (L) 13.0 - 17.0 g/dL   HCT 37.7 (L) 39.0 - 52.0 %   MCV 89.1 80.0 - 100.0 fL   MCH 29.6 26.0 - 34.0 pg   MCHC 33.2 30.0 - 36.0 g/dL   RDW 21.7 (H) 11.5 - 15.5 %   Platelets 246 150 - 400 K/uL   nRBC 0.0 0.0 - 0.2 %    Comment: Performed at [facility], [place] [address] [place], [place] [address]  Comprehensive metabolic panel  Status: Abnormal   Collection Time: 10/25/2018  2:08 PM  Result Value Ref Range   Sodium 146 (H) 135 - 145 mmol/L   Potassium 4.1 3.5 - 5.1 mmol/L   Chloride 115 (H) 98 - 111 mmol/L   CO2 26 22 - 32 mmol/L   Glucose, Bld 88 70 - 99 mg/dL   BUN 19 6 - 20 mg/dL   Creatinine, Ser 0.47 (L) 0.61 - 1.24 mg/dL   Calcium 8.7 (L) 8.9 - 10.3 mg/dL   Total Protein 6.0 (L) 6.5 - 8.1 g/dL   Albumin 2.9 (L) 3.5 - 5.0 g/dL   AST 21 15 - 41 U/L   ALT 17 0 - 44 U/L   Alkaline Phosphatase 50 38 - 126 U/L   Total Bilirubin 0.6 0.3 - 1.2 mg/dL   GFR calc non Af Amer >60 >60 mL/min   GFR calc Af Amer >60 >60 mL/min   Anion gap 5 5 - 15    Comment: Performed at [facility], [place] [address], [place], [place] [address]  Troponin I - ONCE - STAT     Status: None   Collection Time: 11/05/2018  2:08 PM  Result Value Ref Range   Troponin I <0.03 <0.03 ng/mL    Comment: Performed at [facility], [place] [address] [place], [place] [address]  Protime-INR     Status: None   Collection Time: 10/27/2018  2:08 PM  Result Value Ref Range   Prothrombin Time  14.5 11.4 - 15.2 seconds   INR 1.1 0.8 - 1.2    Comment: (NOTE) INR goal varies based on device and disease states. Performed at [facility], [place] [address], [place], [place] [address]   Lactic acid, plasma     Status: None   Collection Time: 10/25/2018  2:12 PM  Result Value Ref Range   Lactic Acid, Venous 1.4 0.5 - 1.9 mmol/L    Comment: Performed at [facility], [place] [address] [place], [place] [address]   Dg Chest 2 View  Result Date: 10/15/2018 CLINICAL DATA:  Generalized weakness for 1 week.  Cancer patient. EXAM: CHEST - 2 VIEW COMPARISON:  10/14/2018 and older exams. FINDINGS: Cardiac silhouette is normal in size. No mediastinal or hilar masses. No convincing adenopathy. Small hiatal hernia. Lower lung nodules on the right, stable from the prior chest radiograph, better assessed on prior chest CT from 09/17/2018. No evidence of pneumonia or pulmonary edema. No pleural effusion or pneumothorax. Contiguous compression fractures along the mid to lower thoracic spine, stable from the prior chest CT. IMPRESSION: 1. No acute cardiopulmonary disease. 2. Lung nodules consistent with metastatic disease. 3. Chronic vertebral compression fractures. Electronically Signed   By: Lajean Manes M.D.   On: 10/29/2018 14:50   Ct Head Wo Contrast  Result Date: 11/08/2018 CLINICAL DATA:  Weakness.  Gastric cancer with brain Mets. EXAM: CT HEAD WITHOUT CONTRAST TECHNIQUE: Contiguous axial images were obtained from the base of the skull through the vertex without intravenous contrast. COMPARISON:  Head CT 10/14/2018 and brain MRI 10/17/2018 FINDINGS: Brain: Stable appearing multiple metastatic brain lesions with surrounding tumoral edema. No evidence for intracranial hemorrhage. No extra-axial fluid collections. No herniation. Vascular: Stable vascular calcifications. Skull: No skull lesions or fractures. Sinuses/Orbits: The paranasal sinuses and mastoid air cells are  clear. The globes are intact. Other: No scalp lesions. IMPRESSION: 1. Stable metastatic brain lesions with surrounding tumoral edema. 2. No new/acute intracranial findings. Electronically Signed  By: Marijo Sanes M.D.   On: 11/06/2018 16:28   Ct Angio Chest Pe W Or Wo Contrast  Result Date: 10/18/2018 CLINICAL DATA:  Shortness of breath, metastatic gastric cancer EXAM: CT ANGIOGRAPHY CHEST WITH CONTRAST TECHNIQUE: Multidetector CT imaging of the chest was performed using the standard protocol during bolus administration of intravenous contrast. Multiplanar CT image reconstructions and MIPs were obtained to evaluate the vascular anatomy. CONTRAST:  72mL OMNIPAQUE IOHEXOL 350 MG/ML SOLN COMPARISON:  06/05/2018 FINDINGS: Cardiovascular: Satisfactory opacification of the pulmonary arteries to the segmental level. Positive examination for pulmonary embolism with segmental to subsegmental embolus bilaterally, for example in the left lower lobe (series 4, image 61) and in the right middle lobe (series 4, image 56). RV LV ratio 0.75. Normal heart size. No pericardial effusion. Mediastinum/Nodes: No enlarged mediastinal, hilar, or axillary lymph nodes. Thyroid gland, trachea, and esophagus demonstrate no significant findings. Lungs/Pleura: Numerous bilateral pulmonary nodules in keeping with known pulmonary metastatic disease, which are enlarged compared to prior examination dated 06/05/2018, an index nodule in the right lower lobe measuring 1.8 cm, previously 1.5 cm when measured similarly (series 6, image 86). No pleural effusion or pneumothorax. Upper Abdomen: No acute abnormality. Partially imaged postoperative findings of gastrectomy. Musculoskeletal: No chest wall abnormality. Osteopenia. Unchanged wedge deformities of T6 and T7. Review of the MIP images confirms the above findings. IMPRESSION: 1. Positive examination for pulmonary embolism with segmental to subsegmental embolus bilaterally, for example in the  left lower lobe (series 4, image 61) and in the right middle lobe (series 4, image 56). No CT evidence of right heart strain. 2. Numerous bilateral pulmonary nodules in keeping with known pulmonary metastatic disease, which are enlarged compared to prior examination dated 06/05/2018, an index nodule in the right lower lobe measuring 1.8 cm, previously 1.5 cm when measured similarly (series 6, image 86). These results were called by telephone at the time of interpretation on 10/28/2018 at 4:30 pm to Dr. Johnney Killian , who verbally acknowledged these results. Electronically Signed   By: Eddie Candle M.D.   On: 11/04/2018 16:31    Pending Labs Unresulted Labs (From admission, onward)    Start     Ordered   10/25/18 0500  CBC  Daily,   R     11/06/2018 1656   10/25/18 0500  Comprehensive metabolic panel  Tomorrow morning,   R     10/23/2018 1726   10/25/18 0000  Heparin level (unfractionated)  Once-Timed,   STAT     11/01/2018 1656   10/25/2018 1315  Urinalysis, Routine w reflex microscopic  ONCE - STAT,   STAT     10/30/2018 1314          Vitals/Pain Today's Vitals   10/14/2018 1525 10/22/2018 1530 10/15/2018 1709 11/02/2018 1744  BP:  (!) 119/95 (!) 130/95 109/60  Pulse:   98   Resp:   14   Temp:      TempSrc:      SpO2: 93%  96%   Weight:      Height:      PainSc:        Isolation Precautions No active isolations  Medications Medications  sodium chloride (PF) 0.9 % injection (has no administration in time range)  heparin ADULT infusion 100 units/mL (25000 units/247mL sodium chloride 0.45%) (900 Units/hr Intravenous New Bag/Given 11/03/2018 1740)  0.9 %  sodium chloride infusion (has no administration in time range)  sodium chloride 0.9 % bolus 1,000 mL (0 mLs Intravenous Stopped  10/27/2018 1629)  iohexol (OMNIPAQUE) 350 MG/ML injection 100 mL (80 mLs Intravenous Contrast Given 11/10/2018 1549)  heparin bolus via infusion 3,500 Units (3,500 Units Intravenous Bolus from Bag 10/15/2018 1743)     Mobility walks with person assist

## 2018-10-24 NOTE — ED Notes (Signed)
Pt provided with water

## 2018-10-24 NOTE — ED Notes (Signed)
ED Provider at bedside. 

## 2018-10-24 NOTE — ED Notes (Addendum)
Pt reports feeling short of breath last few days, denies cough, denies fever, denies sick contacts.  Pt denies any pain, reports taking a tylenol 2 h PTA.

## 2018-10-24 NOTE — Telephone Encounter (Signed)
Pts mother called advising that she wanted to cancel transportation because Ryan Collins is not feeling well this morning and that she is going to call [facility] to advise

## 2018-10-24 NOTE — H&P (Signed)
Ryan Collins is an 58 y.o. male.   Chief Complaint: Shortness of breath, weakness HPI: The patient is a 58 yr old man who was diagnosed with gastric cancer in 2019. He underwent a gastrectomy in 02/2018. He is receiving [place] for therapy. He has been found to have metastasis to his lungs and brain. He has has seizures related to his brain mets, but has had none since starting on Keppra. He denies fevers, chills, chest pain, abdominal pain, or dysuria. He has a frontal headache which is a chronic condition.   Today he presents to the ED with dsypnea and fatigue/weakness. He is found to have bilateral subsegmental PE.   Vitals in the ED: TEmp 97.6, RR 12, PUlse of 107, Blood Pressure: 115/98. He is saturating in the 90's on room air.   Hospitalists have been consulted to admit the patient for further treatment and evaluation. ED has started the patient on a heparin drip.  Past Medical History:  Diagnosis Date  . Cancer [facility])    gastric cancer  . DVT (deep venous thrombosis) ([place]) 2011  . GI bleeding 2001  . History of DVT of lower extremity   . Hypertension   . Melena 05/2017  . Stomach ulcer    Clinically suspected; No EGD confirmation as of 06/03/17    Past Surgical History:  Procedure Laterality Date  . ESOPHAGOGASTRODUODENOSCOPY  06/04/2017  . ESOPHAGOGASTRODUODENOSCOPY N/A 06/04/2017   Procedure: ESOPHAGOGASTRODUODENOSCOPY (EGD);  Surgeon: Carol Ada, MD;  Location: [place];  Service: Endoscopy;  Laterality: N/A;  . GASTRECTOMY N/A 02/25/2018   Procedure: TOTAL GASTRECTOMY WITH J TUBE PLACEMENT;  Surgeon: [name], MD;  Location: [place];  Service: General;  Laterality: N/A;  . IR FLUORO GUIDE PORT INSERTION RIGHT  06/20/2017  . IR US GUIDE VASC ACCESS RIGHT  06/20/2017  . JEJUNOSTOMY N/A 02/25/2018   Procedure: JEJUNOSTOMY TUBE;  Surgeon: [name], MD;  Location: [place];  Service: General;  Laterality: N/A;  . LAPAROSCOPY N/A 02/25/2018   Procedure: LAPAROSCOPY  DIAGNOSTIC;  Surgeon: [name], MD;  Location: [place];  Service: General;  Laterality: N/A;  . SPLENECTOMY, TOTAL N/A 02/25/2018   Procedure: SPLENECTOMY;  Surgeon: [name], MD;  Location: [place];  Service: General;  Laterality: N/A;    Family History  Problem Relation Age of Onset  . Hypertension Mother   . Cancer Sister 26       breast   Social History:  reports that he has never smoked. He has never used smokeless tobacco. He reports current alcohol use of about 5.0 standard drinks of alcohol per week. He reports current drug use. Frequency: 2.00 times per week. Drug: Marijuana. (Not in a hospital admission)   Allergies: No Known Allergies  Pertinent items noted in HPI and remainder of comprehensive ROS otherwise negative.   General appearance: alert, cooperative, cachectic and fatigued Head: Normocephalic, without obvious abnormality, atraumatic Eyes: conjunctivae/corneas clear. PERRL, EOM's intact. Fundi benign. Throat: lips, mucosa, and tongue normal; teeth and gums normal Neck: no adenopathy, no carotid bruit, no JVD, supple, symmetrical, trachea midline and thyroid not enlarged, symmetric, no tenderness/mass/nodules Resp: No increased work of breathing. No wheezes, rales, or rhonchi. No tactile fremitus. Chest wall: no tenderness Cardio: regular rate and rhythm, S1, S2 normal, no murmur, click, rub or gallop GI: soft, non-tender; bowel sounds normal; no masses,  no organomegaly Extremities: extremities normal, atraumatic, no cyanosis or edema Pulses: 2+ and symmetric Skin: Skin color, texture, turgor normal. No rashes or lesions  Lymph nodes: Cervical, supraclavicular, and axillary nodes normal. Neurologic: Alert and oriented X 3, normal strength and tone. Normal symmetric reflexes. Normal coordination and gait  Results for orders placed or performed during the hospital encounter of 11/04/2018 (from the past 48 hour(s))  CBC     Status: Abnormal   Collection Time:  10/21/2018  2:08 PM  Result Value Ref Range   WBC 9.9 4.0 - 10.5 K/uL   RBC 4.23 4.22 - 5.81 MIL/uL   Hemoglobin 12.5 (L) 13.0 - 17.0 g/dL   HCT 37.7 (L) 39.0 - 52.0 %   MCV 89.1 80.0 - 100.0 fL   MCH 29.6 26.0 - 34.0 pg   MCHC 33.2 30.0 - 36.0 g/dL   RDW 21.7 (H) 11.5 - 15.5 %   Platelets 246 150 - 400 K/uL   nRBC 0.0 0.0 - 0.2 %    Comment: Performed at [facility], [place] [address] [place], [place] [address]  Comprehensive metabolic panel     Status: Abnormal   Collection Time: 10/28/2018  2:08 PM  Result Value Ref Range   Sodium 146 (H) 135 - 145 mmol/L   Potassium 4.1 3.5 - 5.1 mmol/L   Chloride 115 (H) 98 - 111 mmol/L   CO2 26 22 - 32 mmol/L   Glucose, Bld 88 70 - 99 mg/dL   BUN 19 6 - 20 mg/dL   Creatinine, Ser 0.47 (L) 0.61 - 1.24 mg/dL   Calcium 8.7 (L) 8.9 - 10.3 mg/dL   Total Protein 6.0 (L) 6.5 - 8.1 g/dL   Albumin 2.9 (L) 3.5 - 5.0 g/dL   AST 21 15 - 41 U/L   ALT 17 0 - 44 U/L   Alkaline Phosphatase 50 38 - 126 U/L   Total Bilirubin 0.6 0.3 - 1.2 mg/dL   GFR calc non Af Amer >60 >60 mL/min   GFR calc Af Amer >60 >60 mL/min   Anion gap 5 5 - 15    Comment: Performed at [facility], [place] [address] [place], [place] [address]  Troponin I - ONCE - STAT     Status: None   Collection Time: 10/16/2018  2:08 PM  Result Value Ref Range   Troponin I <0.03 <0.03 ng/mL    Comment: Performed at [facility], [place] [address], [place], [place] [address]  Protime-INR     Status: None   Collection Time: 11/11/2018  2:08 PM  Result Value Ref Range   Prothrombin Time 14.5 11.4 - 15.2 seconds   INR 1.1 0.8 - 1.2    Comment: (NOTE) INR goal varies based on device and disease states. Performed at [facility], [place] [address], [place], [place] [address]   Lactic acid, plasma     Status: None   Collection Time: 10/31/2018  2:12 PM  Result Value Ref Range   Lactic Acid, Venous 1.4 0.5 - 1.9 mmol/L    Comment:  Performed at [facility], [place] [address] [place], [place] [address]   @[id number] @  Blood pressure (!) 130/95, pulse 98, temperature 97.6 F (36.4 C), temperature source Oral, resp. rate 14, height 5\' 11"  (1.803 m), weight 50.9 kg, SpO2 96 %.    Assessment/Plan  Pulmonary emboli: Subsegmental and bilateral. No evidence of right heart strain.  I have discussed the patient with Dr. Marin Olp who agreed with heparin drip without bolus. He also stated that he felt it would be safe to convert the patient to  one of the novel anticoagulants once he was stabilized. He felt that since gastric cancers are not as vascular as some other cancers, the risk would not be as high as it might be with another malignancy. I also discussed the risk of intracranial bleed due to the brain mets with the patient. He understands the risk but stated that "you have to try". PE is likely secondary to the patient's malignancy.  Seizure disorder secondary to bran mets: The patient states that he has had no seizures since he was started on Keppra. We will continue this.  Gastric Cancer with Mets to lungs and brain: The patient is on Keytruda. He is likely not a candidate for any treatment other than radiation therapy. He has not begun this yet. Palliative care is involved. The patient confirmed with me that he did not want to be put on a breathing machine, but he did want chest compressions and shocks should his heart stop. I will make him a DNI.  Hypertension: The patient's blood pressures are on the low side. Antihypertensives will be held and his blood pressures will be supported by an IV fluid bolus.  I have seen and examined this patient myself. I have spent 80 minutes in his evaluation and care as well as coordination of care with nursing and oncology.  ELOS: 2-3 days PCP: [name], MD CODE STATUS: Modified (DNI) DVT Prophylaxis: Systemic anticoagulation  Tinzlee Craker 10/31/2018, 5:56 PM

## 2018-10-25 ENCOUNTER — Inpatient Hospital Stay (HOSPITAL_COMMUNITY): Payer: Medicaid Other

## 2018-10-25 DIAGNOSIS — R042 Hemoptysis: Secondary | ICD-10-CM

## 2018-10-25 DIAGNOSIS — I82499 Acute embolism and thrombosis of other specified deep vein of unspecified lower extremity: Secondary | ICD-10-CM

## 2018-10-25 LAB — BLOOD GAS, ARTERIAL
Bicarbonate: UNDETERMINED mmol/L (ref 20.0–28.0)
Drawn by: 308601
FIO2: 100
O2 Saturation: 99.5 %
Patient temperature: 97.8
pH, Arterial: 7.616 (ref 7.350–7.450)
pO2, Arterial: 334 mmHg — ABNORMAL HIGH (ref 83.0–108.0)

## 2018-10-25 LAB — COMPREHENSIVE METABOLIC PANEL
ALT: 16 U/L (ref 0–44)
AST: 22 U/L (ref 15–41)
Albumin: 2.8 g/dL — ABNORMAL LOW (ref 3.5–5.0)
Alkaline Phosphatase: 47 U/L (ref 38–126)
Anion gap: 9 (ref 5–15)
BUN: 18 mg/dL (ref 6–20)
CO2: 23 mmol/L (ref 22–32)
Calcium: 8.6 mg/dL — ABNORMAL LOW (ref 8.9–10.3)
Chloride: 115 mmol/L — ABNORMAL HIGH (ref 98–111)
Creatinine, Ser: 0.55 mg/dL — ABNORMAL LOW (ref 0.61–1.24)
GFR calc Af Amer: >60 mL/min (ref >60)
GFR calc non Af Amer: >60 mL/min (ref >60)
Glucose, Bld: 74 mg/dL (ref 70–99)
Potassium: 4.2 mmol/L (ref 3.5–5.1)
Sodium: 147 mmol/L — ABNORMAL HIGH (ref 135–145)
Total Bilirubin: 0.6 mg/dL (ref 0.3–1.2)
Total Protein: 5.8 g/dL — ABNORMAL LOW (ref 6.5–8.1)

## 2018-10-25 LAB — CBC
HCT: 37.4 % — ABNORMAL LOW (ref 39.0–52.0)
Hemoglobin: 12.3 g/dL — ABNORMAL LOW (ref 13.0–17.0)
MCH: 29.7 pg (ref 26.0–34.0)
MCHC: 32.9 g/dL (ref 30.0–36.0)
MCV: 90.3 fL (ref 80.0–100.0)
Platelets: 237 10*3/uL (ref 150–400)
RBC: 4.14 MIL/uL — ABNORMAL LOW (ref 4.22–5.81)
RDW: 22.1 % — ABNORMAL HIGH (ref 11.5–15.5)
WBC: 9.2 10*3/uL (ref 4.0–10.5)
nRBC: 0 % (ref 0.0–0.2)

## 2018-10-25 LAB — HEPARIN LEVEL (UNFRACTIONATED)
Heparin Unfractionated: 1.07 IU/mL — ABNORMAL HIGH (ref 0.30–0.70)
Heparin Unfractionated: 1.05 IU/mL — ABNORMAL HIGH (ref 0.30–0.70)
Heparin Unfractionated: 0.52 IU/mL (ref 0.30–0.70)

## 2018-10-25 MED ORDER — SODIUM CHLORIDE 0.9 % IV BOLUS
500.0000 mL | Freq: Once | INTRAVENOUS | Status: AC
  Administered 2018-10-25: 500 mL via INTRAVENOUS

## 2018-10-25 MED ORDER — HEPARIN (PORCINE) 25000 UT/250ML-% IV SOLN
450.0000 [IU]/h | INTRAVENOUS | Status: DC
  Filled 2018-10-25 (×2): qty 250

## 2018-10-25 MED ORDER — ONDANSETRON HCL 4 MG PO TABS: 8.0000 mg | ORAL_TABLET | Freq: Three times a day (TID) | ORAL | Status: DC | PRN

## 2018-10-25 MED ORDER — HEPARIN (PORCINE) 25000 UT/250ML-% IV SOLN
600.0000 [IU]/h | INTRAVENOUS | Status: DC
  Filled 2018-10-25: qty 250

## 2018-10-25 MED ORDER — HEPARIN (PORCINE) 25000 UT/250ML-% IV SOLN
750.0000 [IU]/h | INTRAVENOUS | Status: DC
  Filled 2018-10-25: qty 250

## 2018-10-25 NOTE — Consult Note (Signed)
Referral MD  Reason for Referral: Pulmonary embolism; metastatic gastric cancer  Chief Complaint  Patient presents with  . Weakness  . Shortness of Breath  : I have cancer.  HPI: [name] is a very nice 58 year old African-American male.  He is originally from [place].  He has been treated by Dr. Burr Medico for metastatic gastric cancer.  It seems as if he does have fairly aggressive disease.  He is on third line therapy with Keytruda.  I think he was recently hospitalized with brain metastasis.  He has not yet started radiation therapy.  He was subsequently discharged.  Now he is readmitted on 11/11/2018 with a pulmonary embolism.  He had a CT angiogram done which showed pulmonary embolism with segmental and subsegmental bilateral disease.  He has bilateral pulmonary nodules.  These are enlarged.  There is no right heart strain.  He may have had a DVT back in 2019.  He is quite cachectic.  He probably has a performance status of ECOG 3.  I really do not think he is able to walk that much.  He has marked muscle atrophy in his legs.  His labs today show a white cell count of 9.2.  Hemoglobin 12.3.  Platelet count 237,000.  His calcium is 8.6 with an albumin of 2.8.  His sodium is 147.  Potassium 4.2.  BUN 18 creatinine 0.55.  The MRI of the brain that had done back on 10/17/2018 showed multiple brain mets.  He had moderate edema.  I know that he is really eating all that much.  He has had no bleeding.  There is no visual changes.  He does seem like a real nice guy.  It was nice talking with him.  It just appears he has incredibly bad disease.    Past Medical History:  Diagnosis Date  . Cancer [facility])    gastric cancer  . DVT (deep venous thrombosis) ([place]) 2011  . GI bleeding 2001  . History of DVT of lower extremity   . Hypertension   . Melena 05/2017  . Stomach ulcer    Clinically suspected; No EGD confirmation as of 06/03/17  :  Past Surgical History:  Procedure Laterality  Date  . ESOPHAGOGASTRODUODENOSCOPY  06/04/2017  . ESOPHAGOGASTRODUODENOSCOPY N/A 06/04/2017   Procedure: ESOPHAGOGASTRODUODENOSCOPY (EGD);  Surgeon: Carol Ada, MD;  Location: [place];  Service: Endoscopy;  Laterality: N/A;  . GASTRECTOMY N/A 02/25/2018   Procedure: TOTAL GASTRECTOMY WITH J TUBE PLACEMENT;  Surgeon: [name], MD;  Location: [place];  Service: General;  Laterality: N/A;  . IR FLUORO GUIDE PORT INSERTION RIGHT  06/20/2017  . IR US GUIDE VASC ACCESS RIGHT  06/20/2017  . JEJUNOSTOMY N/A 02/25/2018   Procedure: JEJUNOSTOMY TUBE;  Surgeon: [name], MD;  Location: [place];  Service: General;  Laterality: N/A;  . LAPAROSCOPY N/A 02/25/2018   Procedure: LAPAROSCOPY DIAGNOSTIC;  Surgeon: [name], MD;  Location: [place];  Service: General;  Laterality: N/A;  . SPLENECTOMY, TOTAL N/A 02/25/2018   Procedure: SPLENECTOMY;  Surgeon: [name], MD;  Location: [place];  Service: General;  Laterality: N/A;  :   Current Facility-Administered Medications:  .  0.9 %  sodium chloride infusion, , Intravenous, Continuous, Swayze, Ava, DO, Last Rate: 75 mL/hr at 10/25/18 0300 .  acetaminophen (TYLENOL) tablet 1,000 mg, 1,000 mg, Oral, Q6H PRN, Swayze, Ava, DO, 1,000 mg at 10/20/2018 2257 .  amLODipine (NORVASC) tablet 2.5 mg, 2.5 mg, Oral, Daily, Swayze, Ava, DO .  cyclobenzaprine (FLEXERIL)  tablet 10 mg, 10 mg, Oral, TID PRN, Swayze, Ava, DO .  dexamethasone (DECADRON) tablet 4 mg, 4 mg, Oral, QID, Swayze, Ava, DO, 4 mg at 11/11/2018 2252 .  feeding supplement (PRO-STAT SUGAR FREE 64) liquid 30 mL, 30 mL, Per Tube, TID, Swayze, Ava, DO .  heparin ADULT infusion 100 units/mL (25000 units/21mL sodium chloride 0.45%), 750 Units/hr, Intravenous, Continuous, Dorrene German, RPH, Last Rate: 7.5 mL/hr at 10/25/18 0141, 750 Units/hr at 10/25/18 0141 .  levETIRAcetam (KEPPRA) tablet 1,000 mg, 1,000 mg, Oral, BID, Swayze, Ava, DO, 1,000 mg at 10/20/2018 2251 .  lidocaine-prilocaine (EMLA)  cream 1 application, 1 application, Topical, PRN, Swayze, Ava, DO .  magic mouthwash w/lidocaine, 5 mL, Oral, QID PRN, Swayze, Ava, DO .  ondansetron (ZOFRAN) tablet 8 mg, 8 mg, Oral, Q8H PRN, Swayze, Ava, DO .  potassium chloride SA (K-DUR) CR tablet 20 mEq, 20 mEq, Oral, BID, Swayze, Ava, DO, 20 mEq at 10/18/2018 2251 .  prochlorperazine (COMPAZINE) tablet 10 mg, 10 mg, Oral, Q6H PRN, Swayze, Ava, DO .  vitamin B-12 (CYANOCOBALAMIN) tablet 1,000 mcg, 1,000 mcg, Oral, Daily, Swayze, Ava, DO:  . amLODipine  2.5 mg Oral Daily  . dexamethasone  4 mg Oral QID  . feeding supplement (PRO-STAT SUGAR FREE 64)  30 mL Per Tube TID  . levETIRAcetam  1,000 mg Oral BID  . potassium chloride SA  20 mEq Oral BID  . vitamin B-12  1,000 mcg Oral Daily  :  No Known Allergies:  Family History  Problem Relation Age of Onset  . Hypertension Mother   . Cancer Sister 62       breast  :  Social History   Socioeconomic History  . Marital status: Legally Separated    Spouse name: Not on file  . Number of children: Not on file  . Years of education: Not on file  . Highest education level: Not on file  Occupational History  . Not on file  Social Needs  . Financial resource strain: Not on file  . Food insecurity    Worry: Not on file    Inability: Not on file  . Transportation needs    Medical: Not on file    Non-medical: Not on file  Tobacco Use  . Smoking status: Never Smoker  . Smokeless tobacco: Never Used  Substance and Sexual Activity  . Alcohol use: Yes    Alcohol/week: 5.0 standard drinks    Types: 5 Cans of beer per week    Comment: 05/2017 i QUIT DRINKING 4 MONTHS AGO "  . Drug use: Yes    Frequency: 2.0 times per week    Types: Marijuana    Comment: last use: 02/17/2018  . Sexual activity: Not on file  Lifestyle  . Physical activity    Days per week: Not on file    Minutes per session: Not on file  . Stress: Not on file  Relationships  . Social Herbalist on phone:  Not on file    Gets together: Not on file    Attends religious service: Not on file    Active member of club or organization: Not on file    Attends meetings of clubs or organizations: Not on file    Relationship status: Not on file  . Intimate partner violence    Fear of current or ex partner: Not on file    Emotionally abused: Not on file    Physically abused: Not on file  Forced sexual activity: Not on file  Other Topics Concern  . Not on file  Social History Narrative  . Not on file  :  Review of Systems  Constitutional: Positive for malaise/fatigue and weight loss.  HENT: Negative.   Eyes: Negative.   Respiratory: Positive for shortness of breath.   Cardiovascular: Negative.   Gastrointestinal: Positive for nausea.  Genitourinary: Negative.   Musculoskeletal: Negative.   Skin: Negative.   Neurological: Positive for weakness.  Endo/Heme/Allergies: Negative.   Psychiatric/Behavioral: Negative.      Exam: Cachectic African-American male in no obvious distress.  Vital signs show temperature of 97.8.  Pulse 71.  Blood pressure 113/88.  Head neck exam shows some temporal muscle wasting.  He has no scleral icterus.  Pupils react appropriately.  There is no adenopathy in the neck.  He has no oral lesions.  There is no thrush.  Lungs are relatively clear bilaterally.  He has decent air movement bilaterally.  Cardiac exam regular rate and rhythm with no murmurs, rubs or bruits.  Abdomen is soft.  No fluid wave is noted.  There is no palpable abdominal mass.  There is no palpable liver or spleen tip.  Extremities shows marked muscle atrophy in upper and lower extremities.  Neurological exam shows no focal neurological deficits. Patient Vitals for the past 24 hrs:  BP Temp Temp src Pulse Resp SpO2 Height Weight  10/25/18 0528 113/88 97.8 F (36.6 C) Oral 71 18 100 % - -  11/02/2018 2016 99/75 97.9 F (36.6 C) Oral 76 16 100 % - -  10/31/2018 1850 (!) 124/96 97.6 F (36.4 C) Oral 86 18  100 % 5\' 11"  (1.803 m) 100 lb 5 oz (45.5 kg)  10/18/2018 1744 109/60 - - - - - - -  10/22/2018 1709 (!) 130/95 - - 98 14 96 % - -  10/19/2018 1530 (!) 119/95 - - - - - - -  10/31/2018 1525 - - - - - 93 % - -  11/05/2018 1330 116/88 - - - 13 - - -  10/28/2018 1300 (!) 115/98 - - - 12 - - -  10/25/2018 1257 - - - - - - 5\' 11"  (1.803 m) 112 lb 3.4 oz (50.9 kg)  10/30/2018 1250 (!) 123/95 97.6 F (36.4 C) Oral (!) 107 (!) 22 100 % - -  11/10/2018 1248 - - - - - 98 % - -     Recent Labs    11/07/2018 1408 10/25/18 0046  WBC 9.9 9.2  HGB 12.5* 12.3*  HCT 37.7* 37.4*  PLT 246 237   Recent Labs    10/29/2018 1408 10/25/18 0046  NA 146* 147*  K 4.1 4.2  CL 115* 115*  CO2 26 23  GLUCOSE 88 74  BUN 19 18  CREATININE 0.47* 0.55*  CALCIUM 8.7* 8.6*    Blood smear review: None  Pathology: None    Assessment and Plan: Mr. Gehlhausen is a 58 year old African-American male with progressive gastric cancer.  He is on immunotherapy with Keytruda.  He has the brain mets.  He now has the pulmonary embolism.  I am sure that he has a DVT in his legs.  I know I cannot feel anything obvious but I am sure that this is the source of his pulmonary embolism.  For right now, I think heparin is a very good idea.  I think this can be controlled easily.  I think will be very effective.  I do not think that  there is a significant risk of him having CNS hemorrhaging.  I know this is always possible.  I know pharmacy will do a great job in managing the heparin.  As far as put him on anticoagulation upon discharge, I think it probably would be easiest to get him on oral therapy.  I know some experts would probably say Lovenox but I am not sure Lovenox would be a good idea for him.  He is cachectic and I think Lovenox administration would be somewhat difficult.  I know that he is getting outstanding care from Dr. Burr Medico.  I know she is trying all she can to try to get him better.  Hopefully, he will start his radiation  therapy soon.  He certainly has quite a few brain mets.  Hopefully he can have the SBRT.  We will certainly follow along and try to help out any way that we can.  I also know that he will get wonderful and compassionate care from the staff on [address].  Lattie Haw, MD  Psalm 27:4

## 2018-10-25 NOTE — Progress Notes (Signed)
[place] for IV heparin Indication: PE  No Known Allergies  Patient Measurements: Height: 5\' 11"  (180.3 cm) Weight: 100 lb 5 oz (45.5 kg) IBW/kg (Calculated) : 75.3 Heparin Dosing Weight: TBW  Vital Signs: Temp: 97.9 F (36.6 C) (06/12 2016) Temp Source: Oral (06/12 2016) BP: 99/75 (06/12 2016) Pulse Rate: 76 (06/12 2016)  Labs: Recent Labs    11/09/2018 1408 10/25/18 0046  HGB 12.5* 12.3*  HCT 37.7* 37.4*  PLT 246 237  LABPROT 14.5  --   INR 1.1  --   HEPARINUNFRC  --  1.07*  CREATININE 0.47* 0.55*  TROPONINI <0.03  --     Estimated Creatinine Clearance: 65.6 mL/min (A) (by C-G formula based on SCr of 0.55 mg/dL (L)).   Medical History: Past Medical History:  Diagnosis Date  . Cancer [facility])    gastric cancer  . DVT (deep venous thrombosis) ([place]) 2011  . GI bleeding 2001  . History of DVT of lower extremity   . Hypertension   . Melena 05/2017  . Stomach ulcer    Clinically suspected; No EGD confirmation as of 06/03/17    Medications:  Medications Prior to Admission  Medication Sig Dispense Refill Last Dose  . acetaminophen (TYLENOL) 500 MG tablet Take 1,000 mg by mouth every 6 (six) hours as needed for mild pain.    10/20/2018 at Unknown time  . Amino Acids-Protein Hydrolys (FEEDING SUPPLEMENT, PRO-STAT SUGAR FREE 64,) LIQD Place 30 mLs into feeding tube 3 (three) times daily. 900 mL 0   . amLODipine (NORVASC) 2.5 MG tablet Take 1 tablet (2.5 mg total) by mouth daily. 90 tablet 3 11/01/2018 at Unknown time  . cyclobenzaprine (FLEXERIL) 10 MG tablet Take 1 tablet (10 mg total) by mouth 3 (three) times daily as needed for muscle spasms. 30 tablet 1 10/23/2018 at Unknown time  . dexamethasone (DECADRON) 4 MG tablet Take 1 tablet (4 mg total) by mouth 4 (four) times daily for 30 days. 120 tablet 0 10/20/2018 at Unknown time  . levETIRAcetam (KEPPRA) 1000 MG tablet Take 1 tablet (1,000 mg total) by mouth 2 (two) times daily. 60  tablet 0 11/09/2018 at Unknown time  . ondansetron (ZOFRAN) 8 MG tablet Take 8 mg by mouth every 8 (eight) hours as needed.   10/23/2018 at Unknown time  . potassium chloride SA (K-DUR,KLOR-CON) 20 MEQ tablet Take 1 tablet (20 mEq total) by mouth 2 (two) times daily. 60 tablet 2 11/01/2018 at Unknown time  . prochlorperazine (COMPAZINE) 10 MG tablet Take 10 mg by mouth every 6 (six) hours as needed.     . vitamin B-12 (CYANOCOBALAMIN) 1000 MCG tablet Take 1 tablet (1,000 mcg total) by mouth daily. 30 tablet 0 10/25/2018 at Unknown time  . lidocaine-prilocaine (EMLA) cream Apply 1 application topically as needed (for port). 30 g 2   . magic mouthwash w/lidocaine SOLN Take 5 mLs by mouth 4 (four) times daily as needed for mouth pain. (Patient not taking: Reported on 10/23/2018) 240 mL 2 Not Taking at Unknown time   Scheduled:  . amLODipine  2.5 mg Oral Daily  . dexamethasone  4 mg Oral QID  . feeding supplement (PRO-STAT SUGAR FREE 64)  30 mL Per Tube TID  . levETIRAcetam  1,000 mg Oral BID  . potassium chloride SA  20 mEq Oral BID  . sodium chloride (PF)      . vitamin B-12  1,000 mcg Oral Daily    Assessment: 81 yoM with  PMH gastric CA with brain/lung mets, Hx DVT, presenting with SOB; found to have bilateral PE w/o R heart strain. Pharmacy to dose IV heparin   Baseline INR, aPTT: not done  Prior anticoagulation: none  Significant events:  Today, 10/25/2018:  0046 HL = 1.07 above goal, per RN heparin is infusing in port and HL was drawn peripherally.  CBC: Hgb slightly low; Plt wnl  No bleeding or infusion issues per nursing  Goal of Therapy: Heparin level 0.3-0.7 units/ml Monitor platelets by anticoagulation protocol: Yes  Plan:  Decrease heparin drip to 750 units/hr  Check heparin level 6 hrs after change  Daily CBC, daily heparin level once stable  Monitor for signs of bleeding or thrombosis    Dorrene German 10/25/2018, 1:37 AM

## 2018-10-25 NOTE — Progress Notes (Signed)
Paged by bedside RN regarding an acute change in patients vitals and status. Apparently while they were attempting to turn the pt he began to state that he could not breathe. BP dropped to 62/47, Pulse Ox was not reading and pt was placed on a NRB.  When I first saw the pt he was drowsy but following simple commands and answering questions appropriately. Pupils equal and reactive, MOE x 4. LS diminished bilaterally with shallow respirations. Transferred to stepdown for closer monitoring. Ordered a 1L NS Bolus, ABG, CXR.  ABG: Ph: 7.61 PC02 <19, PO2 334, O2 sat 99.5 CXR:  "No significant interval change to explain the patient's symptoms"    After several minutes pt began to become more alert (A&Ox 4), BP rose post-bolus to SBP in the 90's-100's. Spoke with pt regarding goals of care and code status. He decided to change his code status to a DNR at this time but continue further medical management. Pt was able to be weaned to RA and states that his breathing has improved. BP still soft will bolus as needed.  Will monitor in stepdown overnight. Spoke with the pts mother and updated her as well.  Arby Barrette AGPCNP-BC, AGNP-C [facility] Pager [phone]

## 2018-10-25 NOTE — Progress Notes (Signed)
PROGRESS NOTE  Ryan Collins FGH:[id number] DOB: 1960/07/27 DOA: 10/17/2018 PCP: Lanae Boast, FNP  Brief History   The patient is a 58 yr old man who was diagnosed with gastric cancer in 2019. He underwent a gastrectomy in 02/2018. He is receiving [place] for therapy. He has been found to have metastasis to his lungs and brain. He has has seizures related to his brain mets, but has had none since starting on Keppra. He denies fevers, chills, chest pain, abdominal pain, or dysuria. He has a frontal headache which is a chronic condition.   On 11/06/2018 he presented to the ED with dsypnea and fatigue/weakness. He was found to have bilateral subsegmental PE. He was started on a heparin drip in the ED.  Consultants   Oncology  Procedures   None  Antibiotics   None  Subjective  The patient states that he is feeling a little better today. No new complaints.  Objective   Vitals:  Vitals:   10/25/18 0528 10/25/18 1245  BP: 113/88 107/83  Pulse: 71 81  Resp: 18 16  Temp: 97.8 F (36.6 C) 97.9 F (36.6 C)  SpO2: 100% 100%    Exam:  Constitutional:   The patient is awake, alert, and oriented x 3. No acute distress. Respiratory:   No increased work of breathing.  No wheezes, rales, or rhonchi.   No tactile fremitus. Cardiovascular:   Regular rate and rhythm.  No murmurs, ectopy, or gallups  No lateral PMI. No thrills. Abdomen:   Abdomen is soft, non-tender, non-distended  No hernias, masses, or organomegaly appreciated  Normoactive bowel sounds. Musculoskeletal:   Cachectic  No cyanosis, clubbing or edema Skin:   No rashes, lesions, ulcers  palpation of skin: no induration or nodules Neurologic:   CN 2-12 intact  Sensation all 4 extremities intact Psychiatric:   Mental status o Mood, affect appropriate o Orientation to person, place, time   judgment and insight appear intact  I have personally reviewed the following:   Today's Data    Vitals  Lab Data   CBC    Component Value Date/Time   WBC 9.2 10/25/2018 0046   RBC 4.14 (L) 10/25/2018 0046   HGB 12.3 (L) 10/25/2018 0046   HGB 13.0 10/01/2018 1054   HCT 37.4 (L) 10/25/2018 0046   PLT 237 10/25/2018 0046   PLT 418 (H) 10/01/2018 1054   MCV 90.3 10/25/2018 0046   MCH 29.7 10/25/2018 0046   MCHC 32.9 10/25/2018 0046   RDW 22.1 (H) 10/25/2018 0046   LYMPHSABS 1.5 10/13/2018 1921   MONOABS 0.0 (L) 10/13/2018 1921   EOSABS 0.0 10/13/2018 1921   BASOSABS 0.0 10/13/2018 1921   CMP     Component Value Date/Time   NA 147 (H) 10/25/2018 0046   K 4.2 10/25/2018 0046   CL 115 (H) 10/25/2018 0046   CO2 23 10/25/2018 0046   GLUCOSE 74 10/25/2018 0046   BUN 18 10/25/2018 0046   CREATININE 0.55 (L) 10/25/2018 0046   CREATININE 0.78 10/01/2018 1054   CALCIUM 8.6 (L) 10/25/2018 0046   PROT 5.8 (L) 10/25/2018 0046   ALBUMIN 2.8 (L) 10/25/2018 0046   AST 22 10/25/2018 0046   AST 23 10/01/2018 1054   ALT 16 10/25/2018 0046   ALT 12 10/01/2018 1054   ALKPHOS 47 10/25/2018 0046   BILITOT 0.6 10/25/2018 0046   BILITOT 0.3 10/01/2018 1054   GFRNONAA >60 10/25/2018 0046   GFRNONAA >60 10/01/2018 1054   GFRAA >60 10/25/2018 0046  GFRAA >60 10/01/2018 1054    Scheduled Meds:  amLODipine  2.5 mg Oral Daily   dexamethasone  4 mg Oral QID   feeding supplement (PRO-STAT SUGAR FREE 64)  30 mL Per Tube TID   levETIRAcetam  1,000 mg Oral BID   potassium chloride SA  20 mEq Oral BID   vitamin B-12  1,000 mcg Oral Daily   Continuous Infusions:  sodium chloride 75 mL/hr at 10/25/18 1409   heparin 450 Units/hr (10/25/18 1449)    Active Problems:   Pulmonary embolus (HCC)   LOS: 1 day    A & P   Pulmonary emboli: Subsegmental and bilateral. No evidence of right heart strain.  I have discussed the patient with Dr. Marin Olp who agreed with heparin drip without bolus. He also stated that he felt it would be safe to convert the patient to one of the novel  anticoagulants once he was stabilized. He felt that since gastric cancers are not as vascular as some other cancers, the risk would not be as high as it might be with another malignancy. I also discussed the risk of intracranial bleed due to the brain mets with the patient. He understands the risk but stated that "you have to try". PE is likely secondary to the patient's malignancy. Dose of heparin was reduced after nursing reported that the patient was having hemoptysis this afternoon. He was getting 600 units/hr. He is now getting 450 units/hr. I appreciate oncology's assistance.  Seizure disorder secondary to bran mets: The patient states that he has had no seizures since he was started on Keppra. We will continue this.  Gastric Cancer with Mets to lungs and brain: The patient is on Keytruda. He is likely not a candidate for any treatment other than radiation therapy. He has not begun this yet. Palliative care is involved. The patient confirmed with me that he did not want to be put on a breathing machine, but he did want chest compressions and shocks should his heart stop. I will make him a DNI.  Hypertension: The patient's blood pressures are on the low side. Antihypertensives will be held and his blood pressures will be supported by an IV fluid bolus.  I have seen and examined this patient myself. I have spent 35 minutes in his evaluation and care as well as coordination of care with nursing.  ELOS: 2-3 days PCP: [name], MD CODE STATUS: Modified (DNI) DVT Prophylaxis: Systemic anticoagulation  Florian Chauca, DO [facility] Direct contact: see [email or web address]  7PM-7AM contact night coverage as above 10/25/2018, 4:46 PM  LOS: 1 day

## 2018-10-25 NOTE — Progress Notes (Signed)
During shift change Pt requested assist to turn to left side, after turning Pt stating, "Can't breath, can't breath", O2 applied via [place] VS: BP 62/47, P 114 rapid response paged, On-call paged

## 2018-10-25 NOTE — Progress Notes (Signed)
ANTICOAGULATION CONSULT NOTE - Follow Up Consult  Pharmacy Consult for heparin Indication: acute pulmonary embolus  No Known Allergies  Patient Measurements: Height: 5\' 11"  (180.3 cm) Weight: 100 lb 5 oz (45.5 kg) IBW/kg (Calculated) : 75.3 Heparin Dosing Weight: 45 kg  Vital Signs: Temp: 97.9 F (36.6 C) (06/13 1245) Temp Source: Oral (06/13 1245) BP: 86/48 (06/13 2038) Pulse Rate: 192 (06/13 2014)  Labs: Recent Labs    10/15/2018 1408 10/25/18 0046 10/25/18 0759 10/25/18 2132  HGB 12.5* 12.3*  --   --   HCT 37.7* 37.4*  --   --   PLT 246 237  --   --   LABPROT 14.5  --   --   --   INR 1.1  --   --   --   HEPARINUNFRC  --  1.07* 1.05* 0.52  CREATININE 0.47* 0.55*  --   --   TROPONINI <0.03  --   --   --     Estimated Creatinine Clearance: 65.6 mL/min (A) (by C-G formula based on SCr of 0.55 mg/dL (L)).   Assessment: Patient is a 58 y.o M with hx DVT and metastatic gastric cancer on keytruda PTA, presented to the ED on 6/12 with c/o weakness and SOB.  Chest CTA on 6/12 showed bilateral PE with no evidence of right heart strain.  Heparin drip started on admission for acute PE.  Today, 10/25/2018: -2132 HL = 0.52 at goal, no infusion issues or bleeding reported by RN.  - cbc stable   Goal of Therapy:  Heparin level 0.3-0.7 units/ml Monitor platelets by anticoagulation protocol: Yes   Plan:  - continue heparin drip at 450 units/hr - daily cbc/HL - monitor for s/s bleeding  Dorrene German 10/25/2018,11:05 PM

## 2018-10-25 NOTE — Progress Notes (Signed)
ANTICOAGULATION CONSULT NOTE - Follow Up Consult  Pharmacy Consult for heparin Indication: acute pulmonary embolus  No Known Allergies  Patient Measurements: Height: 5\' 11"  (180.3 cm) Weight: 100 lb 5 oz (45.5 kg) IBW/kg (Calculated) : 75.3 Heparin Dosing Weight: 45 kg  Vital Signs: Temp: 97.8 F (36.6 C) (06/13 0528) Temp Source: Oral (06/13 0528) BP: 113/88 (06/13 0528) Pulse Rate: 71 (06/13 0528)  Labs: Recent Labs    11/02/2018 1408 10/25/18 0046 10/25/18 0759  HGB 12.5* 12.3*  --   HCT 37.7* 37.4*  --   PLT 246 237  --   LABPROT 14.5  --   --   INR 1.1  --   --   HEPARINUNFRC  --  1.07* 1.05*  CREATININE 0.47* 0.55*  --   TROPONINI <0.03  --   --     Estimated Creatinine Clearance: 65.6 mL/min (A) (by C-G formula based on SCr of 0.55 mg/dL (L)).   Assessment: Patient is a 58 y.o M with hx DVT and metastatic gastric cancer on keytruda PTA, presented to the ED on 6/12 with c/o weakness and SOB.  Chest CTA on 6/12 showed bilateral PE with no evidence of right heart strain.  Heparin drip started on admission for acute PE.  Today, 10/25/2018: - heparin level remains elevated at 1.05 with drip running at 750 units/hr. Verified with phebotomist, heparin drip running at port site and level was drawn on right hand - cbc stable - no bleeding documented  Goal of Therapy:  Heparin level 0.3-0.7 units/ml Monitor platelets by anticoagulation protocol: Yes   Plan:  - decrease heparin drip to 600 units/hr - check 6 hr heparin level - monitor for s/s bleeding  Rommel Hogston P 10/25/2018,9:24 AM

## 2018-10-25 NOTE — Progress Notes (Signed)
Patient spitting up bright red blood, MD Dr Benny Lennert paged. Heparin infusion stopped until MD responds. Will continue to monitor.

## 2018-10-26 MED ORDER — LORAZEPAM 2 MG/ML IJ SOLN: 1.0000 mg | INTRAMUSCULAR | Status: DC | PRN

## 2018-10-26 MED ORDER — GLYCOPYRROLATE 0.2 MG/ML IJ SOLN: 0.2000 mg | INTRAMUSCULAR | Status: DC | PRN

## 2018-10-26 MED ORDER — HALOPERIDOL LACTATE 5 MG/ML IJ SOLN: 0.5000 mg | INTRAMUSCULAR | Status: DC | PRN

## 2018-10-26 MED ORDER — ACETAMINOPHEN 325 MG PO TABS: 650.0000 mg | ORAL_TABLET | Freq: Four times a day (QID) | ORAL | Status: DC | PRN

## 2018-10-26 MED ORDER — MORPHINE SULFATE (CONCENTRATE) 10 MG/0.5ML PO SOLN: 5.0000 mg | ORAL | Status: DC | PRN

## 2018-10-26 MED ORDER — DEXAMETHASONE SODIUM PHOSPHATE 4 MG/ML IJ SOLN: 12.0000 mg | Freq: Four times a day (QID) | INTRAMUSCULAR | Status: DC

## 2018-10-26 MED ORDER — SODIUM CHLORIDE 0.9 % IV BOLUS
1000.0000 mL | Freq: Once | INTRAVENOUS | Status: AC
  Administered 2018-10-26: 1000 mL via INTRAVENOUS

## 2018-10-26 MED ORDER — GLYCOPYRROLATE 1 MG PO TABS: 1.0000 mg | ORAL_TABLET | ORAL | Status: DC | PRN

## 2018-10-26 MED ORDER — LORAZEPAM 2 MG/ML PO CONC: 1.0000 mg | ORAL | Status: DC | PRN

## 2018-10-26 MED ORDER — LORAZEPAM 1 MG PO TABS: 1.0000 mg | ORAL_TABLET | ORAL | Status: DC | PRN

## 2018-10-26 MED ORDER — HALOPERIDOL LACTATE 2 MG/ML PO CONC
0.5000 mg | ORAL | Status: DC | PRN
  Filled 2018-10-26: qty 0.3

## 2018-10-26 MED ORDER — MORPHINE SULFATE (PF) 2 MG/ML IV SOLN
1.0000 mg | INTRAVENOUS | Status: DC | PRN
  Administered 2018-10-26: 1 mg via INTRAVENOUS
  Filled 2018-10-26: qty 1

## 2018-10-26 MED ORDER — SODIUM CHLORIDE 0.9 % IV SOLN
8.0000 mg | Freq: Four times a day (QID) | INTRAVENOUS | Status: DC | PRN
  Filled 2018-10-26: qty 4

## 2018-10-26 MED ORDER — BIOTENE DRY MOUTH MT LIQD: 15.0000 mL | OROMUCOSAL | Status: DC | PRN

## 2018-10-26 MED ORDER — HALOPERIDOL 1 MG PO TABS: 0.5000 mg | ORAL_TABLET | ORAL | Status: DC | PRN

## 2018-10-26 MED ORDER — POLYVINYL ALCOHOL 1.4 % OP SOLN
1.0000 [drp] | Freq: Four times a day (QID) | OPHTHALMIC | Status: DC | PRN
  Filled 2018-10-26: qty 15

## 2018-10-26 MED ORDER — MORPHINE SULFATE (PF) 2 MG/ML IV SOLN
1.0000 mg | INTRAVENOUS | Status: DC | PRN
  Administered 2018-10-26: 1 mg via INTRAVENOUS
  Administered 2018-10-26: 0.5 mg via INTRAVENOUS
  Filled 2018-10-26: qty 1

## 2018-10-26 MED ORDER — DEXAMETHASONE 2 MG PO TABS: 4.0000 mg | ORAL_TABLET | Freq: Four times a day (QID) | ORAL | Status: DC

## 2018-10-26 MED ORDER — DEXAMETHASONE SODIUM PHOSPHATE 4 MG/ML IJ SOLN: 8.0000 mg | Freq: Two times a day (BID) | INTRAMUSCULAR | Status: DC

## 2018-10-26 MED ORDER — ONDANSETRON 4 MG PO TBDP: 4.0000 mg | ORAL_TABLET | Freq: Four times a day (QID) | ORAL | Status: DC | PRN

## 2018-10-26 MED ORDER — ONDANSETRON HCL 4 MG/2ML IJ SOLN: 4.0000 mg | Freq: Four times a day (QID) | INTRAMUSCULAR | Status: DC | PRN

## 2018-10-26 MED ORDER — ACETAMINOPHEN 650 MG RE SUPP: 650.0000 mg | Freq: Four times a day (QID) | RECTAL | Status: DC | PRN

## 2018-10-27 ENCOUNTER — Telehealth: Payer: Self-pay | Admitting: Licensed Clinical Social Worker

## 2018-10-29 ENCOUNTER — Ambulatory Visit: Payer: Medicaid Other | Admitting: Radiation Oncology

## 2018-10-30 ENCOUNTER — Ambulatory Visit: Payer: Medicaid Other

## 2018-10-31 ENCOUNTER — Ambulatory Visit: Payer: Medicaid Other

## 2018-11-03 ENCOUNTER — Ambulatory Visit: Payer: Medicaid Other

## 2018-11-04 ENCOUNTER — Ambulatory Visit: Payer: Medicaid Other

## 2018-11-12 NOTE — Progress Notes (Signed)
Nutrition Brief Note RD working remotely.   Patient screened for MST. Chart reviewed. Pt transitioned to comfort care overnight following seizures and staff discussion with patient's mother.   No further nutrition interventions warranted at this time.  Please re-consult as needed.      Jarome Matin, MS, RD, LDN, [facility] Inpatient Clinical Dietitian Pager # [phone] After hours/weekend pager # [phone]

## 2018-11-12 NOTE — Death Summary Note (Addendum)
Physician Discharge Summary  Ryan Collins YIF:[id number] DOB: [birth year] DOA: 03-Nov-2018  PCP: Lanae Boast, FNP  Admit date: 11-03-2018 Date and Time of Death: 11-05-2018. 10:20  Discharge Diagnoses: Principal diagnosis is #1 Puplmonary embolus Brain metastasis with edema Seizuure Gastric Cancer 5. Severe protein calorie malnutrition Filed Weights   11/03/2018 1257 Nov 03, 2018 1850 Nov 05, 2018 1050  Weight: 50.9 kg 45.5 kg 45.5 kg    History of present illness: The patient is a 58 yr old man who was diagnosed with gastric cancer in 2019. He underwent a gastrectomy in 02/2018. He is receiving [place] for therapy. He has been found to have metastasis to his lungs and brain. He has has seizures related to his brain mets, but has had none since starting on Keppra. He denies fevers, chills, chest pain, abdominal pain, or dysuria. He has a frontal headache which is a chronic condition.    Today he presents to the ED with dsypnea and fatigue/weakness. He is found to have bilateral subsegmental PE.    Vitals in the ED: TEmp 97.6, RR 12, PUlse of 107, Blood Pressure: 115/98. He is saturating in the 90's on room air.    Hospitalists have been consulted to admit the patient for further treatment and evaluation. ED has started the patient on a heparin drip.  Oncology was consulted. Hospital Course:  The patient is a 58 yr old man who was diagnosed with gastric cancer in 2019. He underwent a gastrectomy in 02/2018. He is receiving [place] for therapy. He has been found to have metastasis to his lungs and brain. He has has seizures related to his brain mets, but has had none since starting on Keppra. He denies fevers, chills, chest pain, abdominal pain, or dysuria. He has a frontal headache which is a chronic condition.    On 11/03/18 he presented to the ED with dsypnea and fatigue/weakness. He was found to have bilateral subsegmental PE. He was started on a heparin drip in the ED.   On the afternoon  of 10/25/2018 the patient developed hemoptysis and was coughing up clots. I reduced the dose of heparin by 25%. Later that evening the patient had a seizure and was unresponsive afterwards. Dr. Silas [place] who was on call overnight spoke with the family. They chose to make the patient a DNR and comfort care.  Today's assessment: Please see progress note dated 11-05-18 for physical exam on the day of death. Vitals:  Vitals:   11-05-18 0730 11/05/18 0800  BP: (!) 101/34 (!) 87/28  Pulse:    Resp: 17 19  Temp:    SpO2:      The results of significant diagnostics from this hospitalization (including imaging, microbiology, ancillary and laboratory) are listed below for reference.    Significant Diagnostic Studies: Dg Chest 2 View  Result Date: 11-03-2018 CLINICAL DATA:  Generalized weakness for 1 week.  Cancer patient. EXAM: CHEST - 2 VIEW COMPARISON:  10/14/2018 and older exams. FINDINGS: Cardiac silhouette is normal in size. No mediastinal or hilar masses. No convincing adenopathy. Small hiatal hernia. Lower lung nodules on the right, stable from the prior chest radiograph, better assessed on prior chest CT from 09/17/2018. No evidence of pneumonia or pulmonary edema. No pleural effusion or pneumothorax. Contiguous compression fractures along the mid to lower thoracic spine, stable from the prior chest CT. IMPRESSION: 1. No acute cardiopulmonary disease. 2. Lung nodules consistent with metastatic disease. 3. Chronic vertebral compression fractures. Electronically Signed   By: Lajean Manes M.D.   On: November 03, 2018  14:50   Ct Head Wo Contrast  Result Date: 10/23/2018 CLINICAL DATA:  Weakness.  Gastric cancer with brain Mets. EXAM: CT HEAD WITHOUT CONTRAST TECHNIQUE: Contiguous axial images were obtained from the base of the skull through the vertex without intravenous contrast. COMPARISON:  Head CT 10/14/2018 and brain MRI 10/17/2018 FINDINGS: Brain: Stable appearing multiple metastatic brain lesions  with surrounding tumoral edema. No evidence for intracranial hemorrhage. No extra-axial fluid collections. No herniation. Vascular: Stable vascular calcifications. Skull: No skull lesions or fractures. Sinuses/Orbits: The paranasal sinuses and mastoid air cells are clear. The globes are intact. Other: No scalp lesions. IMPRESSION: 1. Stable metastatic brain lesions with surrounding tumoral edema. 2. No new/acute intracranial findings. Electronically Signed   By: Marijo Sanes M.D.   On: 10/21/2018 16:28   Ct Head Wo Contrast  Result Date: 10/14/2018 CLINICAL DATA:  Stroke follow-up EXAM: CT HEAD WITHOUT CONTRAST TECHNIQUE: Contiguous axial images were obtained from the base of the skull through the vertex without intravenous contrast. COMPARISON:  None. FINDINGS: Brain: Unchanged size and distribution of multiple hemorrhagic lesions scattered throughout both cerebral hemispheres, the largest of which is in the left temporal lobe. The surrounding vasogenic edema patterns are unchanged. Size and configuration of the CSF spaces are also unchanged. Thickened appearance of the pituitary infundibulum. Vascular: No abnormal hyperdensity of the major intracranial arteries or dural venous sinuses. No intracranial atherosclerosis. Skull: The visualized skull base, calvarium and extracranial soft tissues are normal. Sinuses/Orbits: No fluid levels or advanced mucosal thickening of the visualized paranasal sinuses. No mastoid or middle ear effusion. The orbits are normal. IMPRESSION: 1. Unchanged size and configuration of multiple hemorrhagic lesions scattered throughout both hemispheres (approximately 5). Unchanged edema pattern. MRI of the brain with and without contrast is recommended. 2. Thickened appearance of the pituitary infundibulum may also indicate metastatic disease. Attention recommended on MRI. Electronically Signed   By: Ulyses Jarred M.D.   On: 10/14/2018 03:51   Ct Angio Chest Pe W Or Wo Contrast  Result  Date: 10/31/2018 CLINICAL DATA:  Shortness of breath, metastatic gastric cancer EXAM: CT ANGIOGRAPHY CHEST WITH CONTRAST TECHNIQUE: Multidetector CT imaging of the chest was performed using the standard protocol during bolus administration of intravenous contrast. Multiplanar CT image reconstructions and MIPs were obtained to evaluate the vascular anatomy. CONTRAST:  64mL OMNIPAQUE IOHEXOL 350 MG/ML SOLN COMPARISON:  06/05/2018 FINDINGS: Cardiovascular: Satisfactory opacification of the pulmonary arteries to the segmental level. Positive examination for pulmonary embolism with segmental to subsegmental embolus bilaterally, for example in the left lower lobe (series 4, image 61) and in the right middle lobe (series 4, image 56). RV LV ratio 0.75. Normal heart size. No pericardial effusion. Mediastinum/Nodes: No enlarged mediastinal, hilar, or axillary lymph nodes. Thyroid gland, trachea, and esophagus demonstrate no significant findings. Lungs/Pleura: Numerous bilateral pulmonary nodules in keeping with known pulmonary metastatic disease, which are enlarged compared to prior examination dated 06/05/2018, an index nodule in the right lower lobe measuring 1.8 cm, previously 1.5 cm when measured similarly (series 6, image 86). No pleural effusion or pneumothorax. Upper Abdomen: No acute abnormality. Partially imaged postoperative findings of gastrectomy. Musculoskeletal: No chest wall abnormality. Osteopenia. Unchanged wedge deformities of T6 and T7. Review of the MIP images confirms the above findings. IMPRESSION: 1. Positive examination for pulmonary embolism with segmental to subsegmental embolus bilaterally, for example in the left lower lobe (series 4, image 61) and in the right middle lobe (series 4, image 56). No CT evidence of right heart strain. 2.  Numerous bilateral pulmonary nodules in keeping with known pulmonary metastatic disease, which are enlarged compared to prior examination dated 06/05/2018, an index  nodule in the right lower lobe measuring 1.8 cm, previously 1.5 cm when measured similarly (series 6, image 86). These results were called by telephone at the time of interpretation on 10/21/2018 at 4:30 pm to Dr. Johnney Killian , who verbally acknowledged these results. Electronically Signed   By: Eddie Candle M.D.   On: 11/10/2018 16:31   Mr Jeri Cos DG Contrast  Result Date: 10/18/2018 CLINICAL DATA:  Intracranial metastatic disease. Planning for stereotactic radio surgery. EXAM: MRI HEAD WITHOUT AND WITH CONTRAST TECHNIQUE: Multiplanar, multiecho pulse sequences of the brain and surrounding structures were obtained without and with intravenous contrast. CONTRAST:  5 mL Gadavist COMPARISON:  Head CT 10/13/2018, 10/14/2018 FINDINGS: BRAIN: There is no acute ischemic infarct. No midline shift or hydrocephalus. Atrophy is greater than expected for age. Susceptibility weighted imaging is degraded by motion but there is no large hematoma. There are multiple intraparenchymal masses: 1. Right cerebellum, 18 x 15 mm, mild surrounding edema. Series 13, image 56 2. Base of the pituitary infundibulum, 8 x 8 mm.  Image 90 3. Left temporal lobe, 18 x 17 mm, moderate edema.  Image 100 4. Left occipital lobe, 3 mm, no edema.  Image 106 5. Posterior left insula, 14 x 13 mm, mild edema.  Image 112 6. Right frontal posterior white matter 10 x 11 mm, moderate edema. Image 117 7. Left frontal lobe, 14 x 12 mm, moderate surrounding edema. Image 132 8. Subcortical posterior right frontal lobe 14 x 10 mm, mild surrounding edema. Image 141 9. Superior left frontal lobe, 15 x 13 mm, moderate edema. Image 153 10. Superior right frontal lobe, 10 x 10 mm, mild edema.  Image 141 VASCULAR: The major intracranial arterial and venous sinus flow voids are normal. SKULL AND UPPER CERVICAL SPINE: Calvarial bone marrow signal is normal. There is no skull base mass. Visualized upper cervical spine and soft tissues are normal. SINUSES/ORBITS: No fluid  levels or advanced mucosal thickening. No mastoid or middle ear effusion. The orbits are normal. IMPRESSION: 1. At least 10 intraparenchymal metastases, the largest of which is located in the left temporal lobe and measures 18 x 17 mm. Multiple lesions have moderate surrounding edema. 2. No herniation, midline shift or hydrocephalus. 3. The lesions are annotated on series 13. Electronically Signed   By: Ulyses Jarred M.D.   On: 10/18/2018 00:06   Dg Chest Port 1 View  Result Date: 10/25/2018 CLINICAL DATA:  Shortness of breath. EXAM: PORTABLE CHEST 1 VIEW COMPARISON:  10/21/2018 FINDINGS: The right-sided Port-A-Cath is well position. Bilateral pulmonary nodules are noted. There is no evidence of a pneumothorax. No large pleural effusion. The heart size is normal. IMPRESSION: No significant interval change to explain the patient's symptoms. Electronically Signed   By: Constance Holster M.D.   On: 10/25/2018 20:48   Dg Chest Port 1 View  Result Date: 10/14/2018 CLINICAL DATA:  History of stomach carcinoma, check tube placement EXAM: PORTABLE CHEST 1 VIEW COMPARISON:  10/13/2018, CT from 09/17/2010 FINDINGS: Cardiac shadows within normal limits. Endotracheal tube, gastric catheter right chest wall port are noted. Gastric catheter is noted coiled upon itself within the distal esophagus. Multiple nodular changes are noted in both lungs consistent with metastatic disease. No acute bony abnormality is noted. IMPRESSION: Stable pulmonary metastasis. Gastric catheter coiled upon itself within the distal esophagus. Electronically Signed   By: Elta Guadeloupe  Lukens M.D.   On: 10/14/2018 07:37   Dg Chest Portable 1 View  Result Date: 10/13/2018 CLINICAL DATA:  Gastric cancer.  Patient intubated. EXAM: PORTABLE CHEST 1 VIEW COMPARISON:  CT scan Sep 17, 2018 FINDINGS: Pulmonary nodularity was better assessed on the previous CT scan and is new since July 10, 2017, consistent with patient's known pulmonary metastases. An ETT  terminates in good position. A right Port-A-Cath terminates in the central SVC. No pneumothorax. Skin folds over the upper right chest. No acute infiltrate. IMPRESSION: 1. Support apparatus as above. 2. Known pulmonary metastases. Electronically Signed   By: Dorise Bullion III M.D   On: 10/13/2018 19:46   Dg Abd Portable 1v  Result Date: 10/14/2018 CLINICAL DATA:  Check gastric catheter placement EXAM: PORTABLE ABDOMEN - 1 VIEW COMPARISON:  None. FINDINGS: Gastric catheter is noted coiled within the distal esophagus. This should be withdrawn and readvanced into the stomach. Scattered large and small bowel gas is noted. IMPRESSION: Gastric catheter coiled within the distal esophagus. Electronically Signed   By: Inez Catalina M.D.   On: 10/14/2018 07:35   Ct Head Code Stroke Wo Contrast`  Result Date: 10/13/2018 CLINICAL DATA:  Code stroke.  Altered mental status EXAM: CT HEAD WITHOUT CONTRAST TECHNIQUE: Contiguous axial images were obtained from the base of the skull through the vertex without intravenous contrast. COMPARISON:  None. FINDINGS: Brain: There are multiple hemorrhagic lesions, the largest of which is located in the left temporal lobe and measures 16 mm. There are at least 5 lesions. No infratentorial lesions are visible. There is no midline shift or other mass effect. Moderate edema surrounds all lesions. No hydrocephalus. Vascular: Atherosclerotic calcification of the internal carotid arteries at the skull base. No abnormal hyperdensity of the major intracranial arteries or dural venous sinuses. Skull: The visualized skull base, calvarium and extracranial soft tissues are normal. Sinuses/Orbits: No fluid levels or advanced mucosal thickening of the visualized paranasal sinuses. No mastoid or middle ear effusion. The orbits are normal. IMPRESSION: 1. At least 5 hyperdense metastases with surrounding edema, greatest in the left temporal lobe. Small volume hemorrhage is associated with most of the  lesions. 2. ASPECTS is not applicable in the setting of hemorrhage These results were communicated to Dr. Roland Rack at 8:02 pm on 10/13/2018 by text page via the [facility] messaging system. Electronically Signed   By: Ulyses Jarred M.D.   On: 10/13/2018 20:06    Microbiology: Recent Results (from the past 240 hour(s))  SARS Coronavirus 2 (CEPHEID - Performed in [place] hospital lab), Hosp Order     Status: None   Collection Time: 10/18/2018  6:20 PM   Specimen: Nasopharyngeal Swab  Result Value Ref Range Status   SARS Coronavirus 2 NEGATIVE NEGATIVE Final    Comment: (NOTE) If result is NEGATIVE SARS-CoV-2 target nucleic acids are NOT DETECTED. The SARS-CoV-2 RNA is generally detectable in upper and lower  respiratory specimens during the acute phase of infection. The lowest  concentration of SARS-CoV-2 viral copies this assay can detect is 250  copies / mL. A negative result does not preclude SARS-CoV-2 infection  and should not be used as the sole basis for treatment or other  patient management decisions.  A negative result may occur with  improper specimen collection / handling, submission of specimen other  than nasopharyngeal swab, presence of viral mutation(s) within the  areas targeted by this assay, and inadequate number of viral copies  (<250 copies / mL). A negative result  must be combined with clinical  observations, patient history, and epidemiological information. If result is POSITIVE SARS-CoV-2 target nucleic acids are DETECTED. The SARS-CoV-2 RNA is generally detectable in upper and lower  respiratory specimens dur ing the acute phase of infection.  Positive  results are indicative of active infection with SARS-CoV-2.  Clinical  correlation with patient history and other diagnostic information is  necessary to determine patient infection status.  Positive results do  not rule out bacterial infection or co-infection with other viruses. If result is PRESUMPTIVE  POSTIVE SARS-CoV-2 nucleic acids MAY BE PRESENT.   A presumptive positive result was obtained on the submitted specimen  and confirmed on repeat testing.  While 2019 novel coronavirus  (SARS-CoV-2) nucleic acids may be present in the submitted sample  additional confirmatory testing may be necessary for epidemiological  and / or clinical management purposes  to differentiate between  SARS-CoV-2 and other Sarbecovirus currently known to infect humans.  If clinically indicated additional testing with an alternate test  methodology [phone]) is advised. The SARS-CoV-2 RNA is generally  detectable in upper and lower respiratory sp ecimens during the acute  phase of infection. The expected result is Negative. Fact Sheet for Patients:  StrictlyIdeas.no Fact Sheet for Healthcare Providers: BankingDealers.co.za This test is not yet approved or cleared by the [place] FDA and has been authorized for detection and/or diagnosis of SARS-CoV-2 by FDA under an Emergency Use Authorization (EUA).  This EUA will remain in effect (meaning this test can be used) for the duration of the COVID-19 declaration under Section 564(b)(1) of the Act, 21 U.S.C. section 360bbb-3(b)(1), unless the authorization is terminated or revoked sooner. Performed at [facility], [place] [address] [place], [place] [address]      Labs: Basic Metabolic Panel: Recent Labs  Lab 10/30/2018 1408 10/25/18 0046  NA 146* 147*  K 4.1 4.2  CL 115* 115*  CO2 26 23  GLUCOSE 88 74  BUN 19 18  CREATININE 0.47* 0.55*  CALCIUM 8.7* 8.6*   Liver Function Tests: Recent Labs  Lab 10/22/2018 1408 10/25/18 0046  AST 21 22  ALT 17 16  ALKPHOS 50 47  BILITOT 0.6 0.6  PROT 6.0* 5.8*  ALBUMIN 2.9* 2.8*   No results for input(s): LIPASE, AMYLASE in the last 168 hours. No results for input(s): AMMONIA in the last 168 hours. CBC: Recent Labs  Lab 10/14/2018 1408  10/25/18 0046  WBC 9.9 9.2  HGB 12.5* 12.3*  HCT 37.7* 37.4*  MCV 89.1 90.3  PLT 246 237   Cardiac Enzymes: Recent Labs  Lab 10/20/2018 1408  TROPONINI <0.03   BNP: BNP (last 3 results) No results for input(s): BNP in the last 8760 hours.  ProBNP (last 3 results) No results for input(s): PROBNP in the last 8760 hours.  CBG: No results for input(s): GLUCAP in the last 168 hours.  Active Problems:   Pulmonary embolus ([place])   Time coordinating discharge: 38 minutes  Signed:        Candita Borenstein, DO [facility]  11/09/2018, 6:42 PM

## 2018-11-12 NOTE — Progress Notes (Signed)
Events of yesterday are noted.  He is now down in the ICU.  He is not responsive.  After believe that the seizures that he had are secondary to the brain metastasis.  I certainly understand the change to comfort care.  His prognosis was not great to begin with.  He really had refractory disease and then had the brain metastasis.  He then developed the pulmonary embolism.  The heparin has been stopped.  He is on a rebreather.  He is hypotensive.  I know that everything has been done to try to help him out.  Again, he just had a very aggressive cancer.  I know that Dr. Burr Medico did a great job with him.  Given his current status, I will have to think that he probably is not going to be able to take oral meds.  There probably is quite a few that he does not need now.  I am unsure what to do about the antiseizure medication.  He probably does need to be on Decadron for any type of swelling from the brain metastasis.  Lattie Haw, MD  [address] [place] 4:6-8

## 2018-11-12 NOTE — Progress Notes (Signed)
RN arranging patient's family members to be allowed for visitation. Otho Najjar (patient's sister) made aware.

## 2018-11-12 NOTE — Progress Notes (Signed)
Patient became bradycardic and apneic. Patient was asystole when verified by this RN and Lars Masson, charge RN. Time of death 1020am.  Family at bedside. Emotional support provided.  MD made aware.

## 2018-11-12 NOTE — Progress Notes (Signed)
PROGRESS NOTE  Ryan Collins ZOX:[id number] DOB: [birth year] DOA: 11/05/2018 PCP: Lanae Boast, FNP  Brief History   The patient is a 58 yr old man who was diagnosed with gastric cancer in 2019. He underwent a gastrectomy in 02/2018. He is receiving [place] for therapy. He has been found to have metastasis to his lungs and brain. He has has seizures related to his brain mets, but has had none since starting on Keppra. He denies fevers, chills, chest pain, abdominal pain, or dysuria. He has a frontal headache which is a chronic condition.   On 10/15/2018 he presented to the ED with dsypnea and fatigue/weakness. He was found to have bilateral subsegmental PE. He was started on a heparin drip in the ED.  On the afternoon of 10/25/2018 the patient developed hemoptysis and was coughing up clots. I reduced the dose of heparin by 25%. Later that evening the patient had a seizure and was unresponsive afterwards. Dr. Silas [place] who was on call overnight spoke with the family. They chose to make the patient a DNR and comfort care.  Consultants  . Oncology  Procedures  . None  Antibiotics  . None  Subjective  The patient is lying in bed. His family is at bedside.  Objective   Vitals:  Vitals:   11-Nov-2018 0730 11-11-2018 0800  BP: (!) 101/34 (!) 87/28  Pulse:    Resp: 17 19  Temp:    SpO2:      Exam:  Constitutional:  . The patient is unresponsive to noxious stimuli.  Respiratory:  . No increased work of breathing. . No wheezes, rales, or rhonchi.  . No tactile fremitus. Cardiovascular:  . Regular rate and rhythm. . No murmurs, ectopy, or gallups . No lateral PMI. No thrills. Abdomen:  . Abdomen is soft, non-tender, non-distended . No hernias, masses, or organomegaly appreciated . Normoactive bowel sounds. Musculoskeletal:  . Cachectic . No cyanosis, clubbing or edema Skin:  . No rashes, lesions, ulcers . palpation of skin: no induration or nodules Neurologic:  . The  patient is unable to cooperate with exam.  I have personally reviewed the following:   Today's Data  . Vitals  Lab Data   CBC    Component Value Date/Time   WBC 9.2 10/25/2018 0046   RBC 4.14 (L) 10/25/2018 0046   HGB 12.3 (L) 10/25/2018 0046   HGB 13.0 10/01/2018 1054   HCT 37.4 (L) 10/25/2018 0046   PLT 237 10/25/2018 0046   PLT 418 (H) 10/01/2018 1054   MCV 90.3 10/25/2018 0046   MCH 29.7 10/25/2018 0046   MCHC 32.9 10/25/2018 0046   RDW 22.1 (H) 10/25/2018 0046   LYMPHSABS 1.5 10/13/2018 1921   MONOABS 0.0 (L) 10/13/2018 1921   EOSABS 0.0 10/13/2018 1921   BASOSABS 0.0 10/13/2018 1921   CMP     Component Value Date/Time   NA 147 (H) 10/25/2018 0046   K 4.2 10/25/2018 0046   CL 115 (H) 10/25/2018 0046   CO2 23 10/25/2018 0046   GLUCOSE 74 10/25/2018 0046   BUN 18 10/25/2018 0046   CREATININE 0.55 (L) 10/25/2018 0046   CREATININE 0.78 10/01/2018 1054   CALCIUM 8.6 (L) 10/25/2018 0046   PROT 5.8 (L) 10/25/2018 0046   ALBUMIN 2.8 (L) 10/25/2018 0046   AST 22 10/25/2018 0046   AST 23 10/01/2018 1054   ALT 16 10/25/2018 0046   ALT 12 10/01/2018 1054   ALKPHOS 47 10/25/2018 0046   BILITOT 0.6 10/25/2018 0046  BILITOT 0.3 10/01/2018 1054   GFRNONAA >60 10/25/2018 0046   GFRNONAA >60 10/01/2018 1054   GFRAA >60 10/25/2018 0046   GFRAA >60 10/01/2018 1054    Scheduled Meds:  Continuous Infusions:   Active Problems:   Pulmonary embolus (HCC)   LOS: 2 days    A & P   Pulmonary emboli: Subsegmental and bilateral. No evidence of right heart strain.  I have discussed the patient with Dr. Marin Olp who agreed with heparin drip without bolus. He also stated that he felt it would be safe to convert the patient to one of the novel anticoagulants once he was stabilized. He felt that since gastric cancers are not as vascular as some other cancers, the risk would not be as high as it might be with another malignancy. I also discussed the risk of intracranial bleed due  to the brain mets with the patient. He understands the risk but stated that "you have to try". PE is likely secondary to the patient's malignancy. Dose of heparin was reduced after nursing reported that the patient was having hemoptysis this afternoon. He was getting 600 units/hr. He is now getting 450 units/hr. I appreciate oncology's assistance.  Later that evening the patient had a seizure and was unresponsive afterwards. Dr. Silas [place] who was on call overnight spoke with the family. They chose to make the patient a DNR and comfort care. I have placed comprehensive comfort care orders for the patient and will transfer him out of the ICU.  Seizure disorder secondary to bran mets: Continue keppra. Ativan available for seizure activity.  Gastric Cancer with Mets to lungs and brain: Comfort care  Hypertension: Comfort care  I have seen and examined this patient myself. I have spent 35 minutes in his evaluation and care as well as coordination of care with nursing. I have discussed the patient in detail with the family. All questions answered to the best of my abillity.  ELOS: 2-3 days PCP: [name], MD CODE STATUS: Modified (DNI) DVT Prophylaxis: Systemic anticoagulation  Ruven Corradi, DO [facility] Direct contact: see [email or web address]  7PM-7AM contact night coverage as above  2018/11/04, 4:46 PM  LOS: 1 day

## 2018-11-12 NOTE — Progress Notes (Signed)
This RN had just cleaned the pt from incontinent stool when around 0432 pt became rigid, eyes rolled fully back into his skull, neck rigid and stiff tilting backwards. This RN placed pt on his side and continued to monitor and time seizure activity. Seizure lasted approximately 30 seconds.   Pt became unresponsive with eyes closed. Pupils were equal, round and reactive and there were no signs of facial droop or one sided weakness. Pt was hypotensive and placed back on NRB at 15 L/min  Around 0450 pt became hypotensive and continued to not respond to voice or pain. Pt began having dyspnea, with agonal breathing. NP at bedside. NP called an updated pt's mother. Pt has now been made comfort care. Heparin has been turned off.

## 2018-11-12 NOTE — Progress Notes (Signed)
Paged by bedside RN regarding an acute status change. Pt had a seizure lasting approximately 30 seconds and has since become unresponsive and hypotensive. Also placed back on a NRB. Pt does not respond to verbal or noxious stimuli. Labored and agonal respirations present as well. Spoke with the pts mother and updated her on his condition and acute change. The decision was made to make the pt comfort care at this time. Ordered 1-2mg  of Morphine Q2H for sever pain/labored respirations, and Ativan 1mg  Q4H for seizures.  Arby Barrette AGPCNP-BC, AGNP-C [facility] Pager [phone]

## 2018-11-12 DEATH — deceased

## 2019-10-10 IMAGING — CT CT CHEST WITH CONTRAST
2 of 5 series · 13 of 36 positions shown, 16 images · IV contrast (OMNIPAQUE)
Comparison: 06/05/2018

CLINICAL DATA: Gastric cancer, pulmonary metastases, weight loss

EXAM:
CT CHEST, ABDOMEN, AND PELVIS WITH CONTRAST
TECHNIQUE: Multidetector CT imaging of the chest, abdomen and pelvis was
performed following the standard protocol during bolus
administration of intravenous contrast.
CONTRAST:  100mL OMNIPAQUE IOHEXOL 300 MG/ML SOLN, additional oral
enteric contrast

[Series 2: cap with · axial · 0.72mm/px · z∈[-405,+145]mm · 10 of 136 slices shown, 13 images]
[im 13/136  mediastinal]
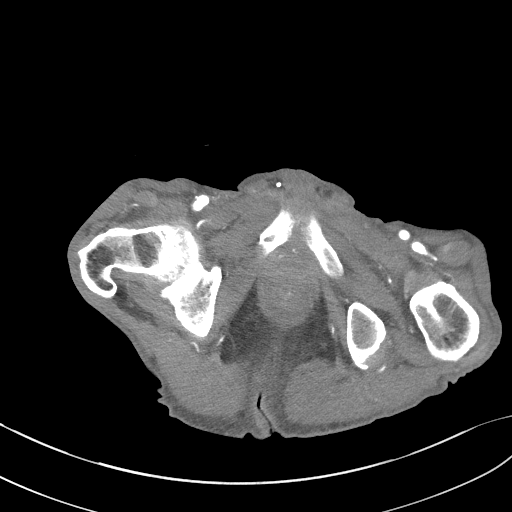
[im 13/136  lung]
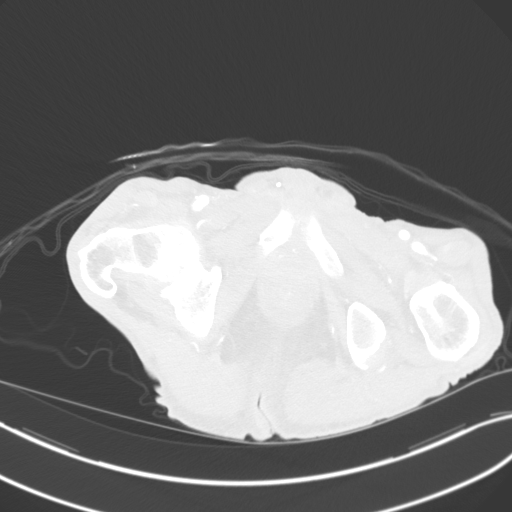
[im 25/136  lung]
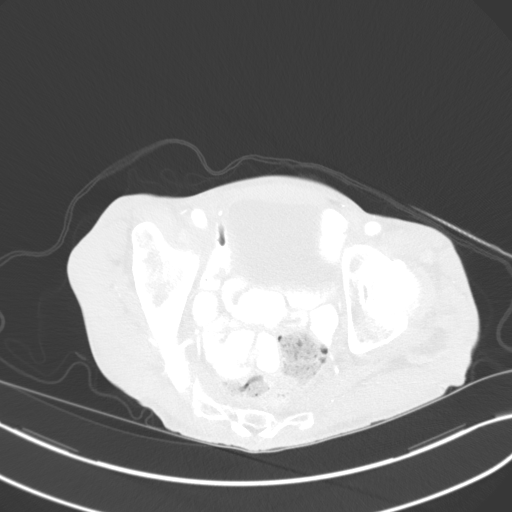
[im 37/136  lung]
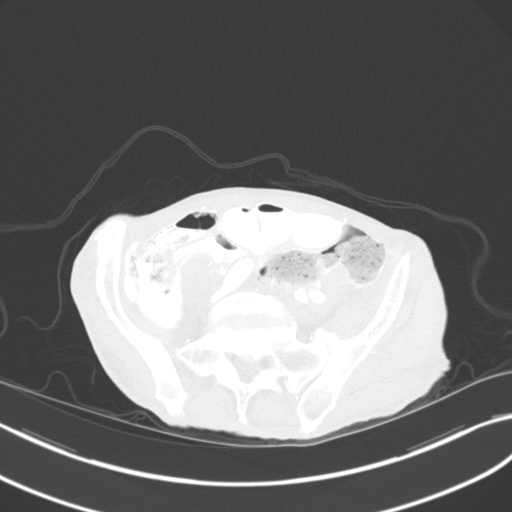
[im 50/136  lung]
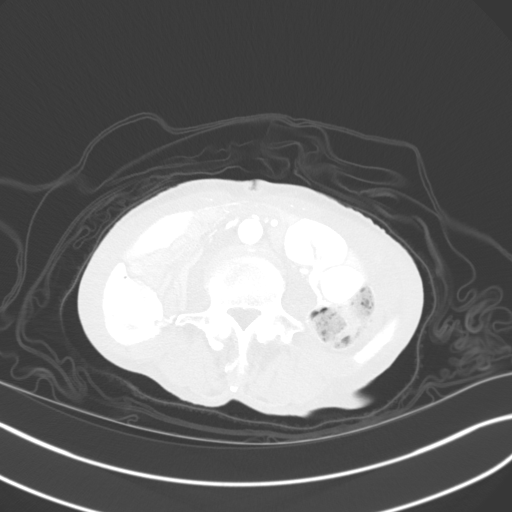
[im 62/136  mediastinal]
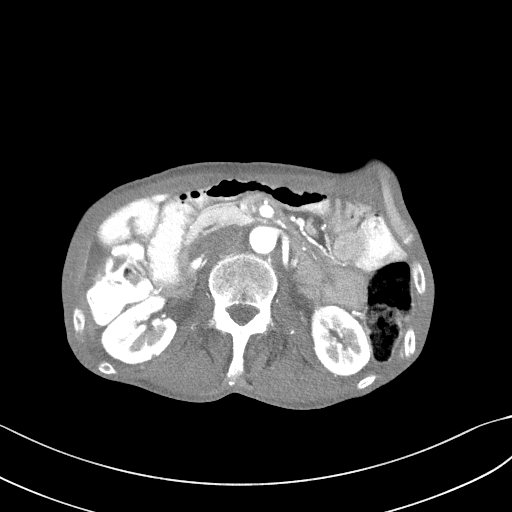
[im 62/136  lung]
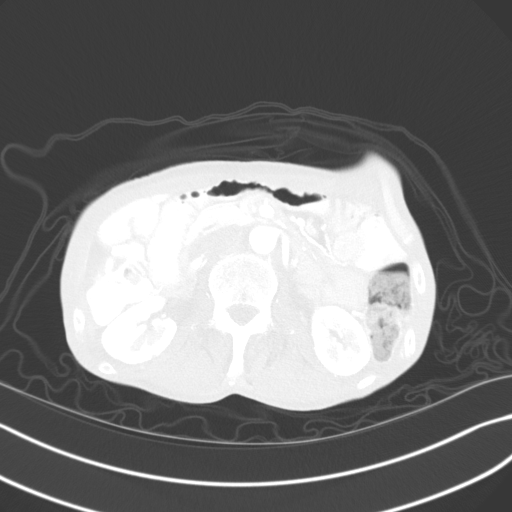
[im 74/136  lung]
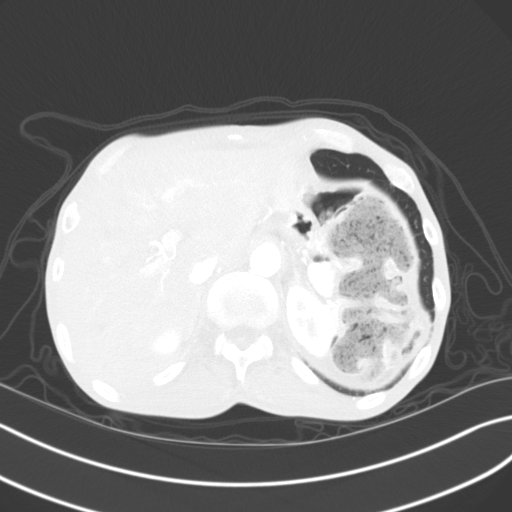
[im 86/136  lung]
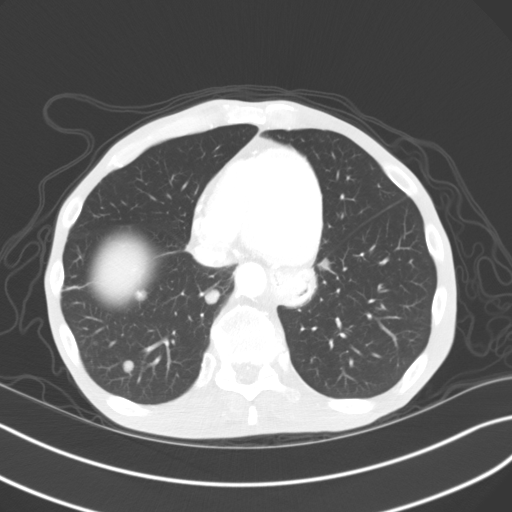
[im 99/136  lung]
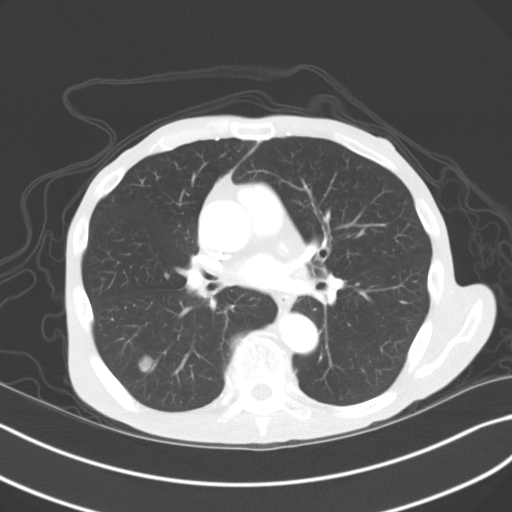
[im 111/136  mediastinal]
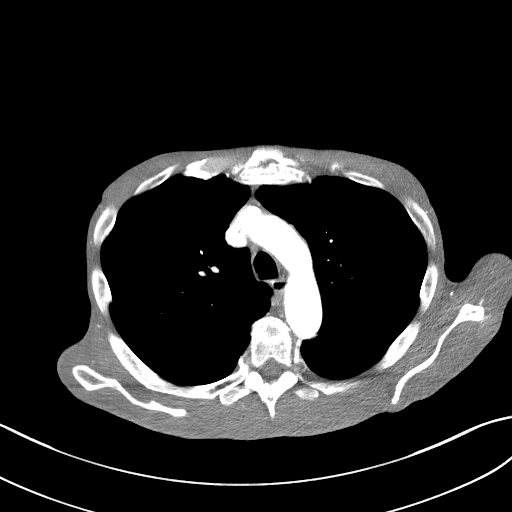
[im 111/136  lung]
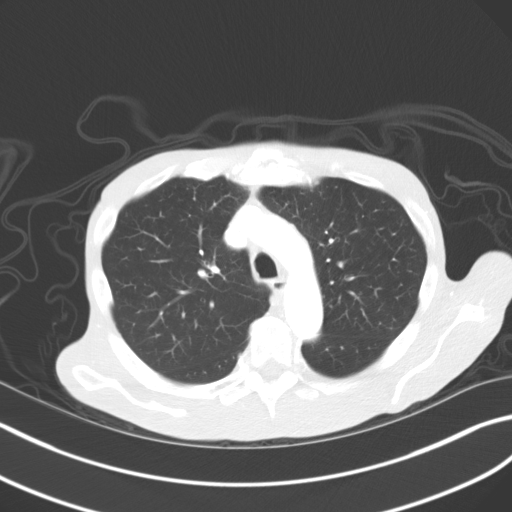
[im 123/136  lung]
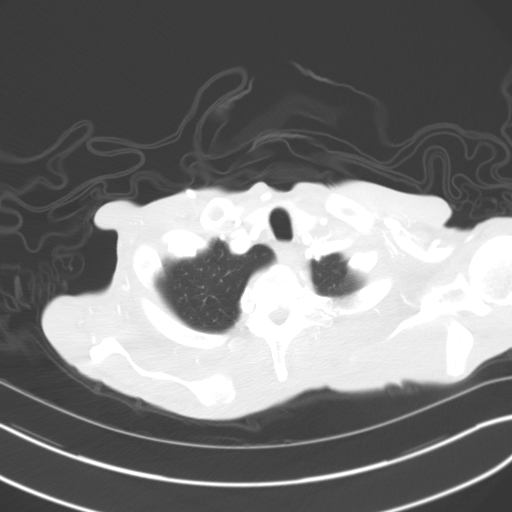

[Series 4: coronals · coronal · 0.92mm/px · 3 of 134 slices shown]
[im 27/134  lung]
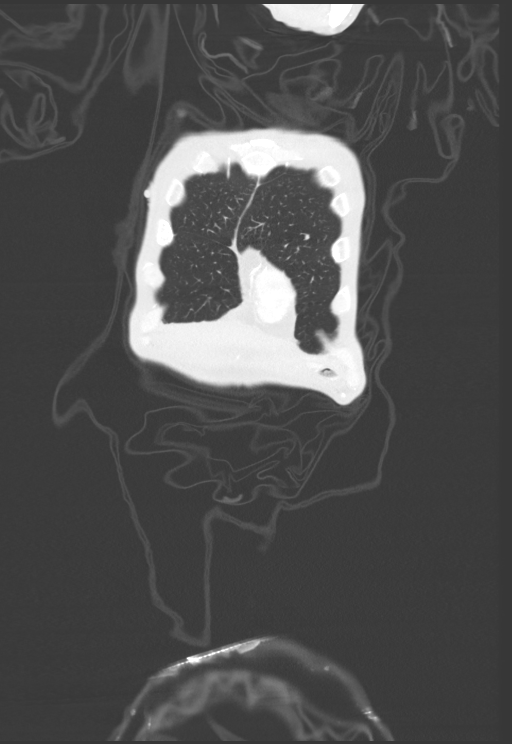
[im 54/134  lung]
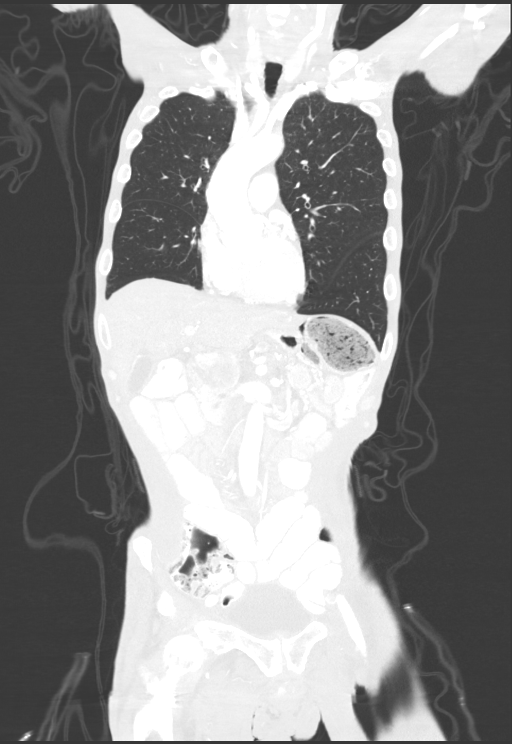
[im 80/134  lung]
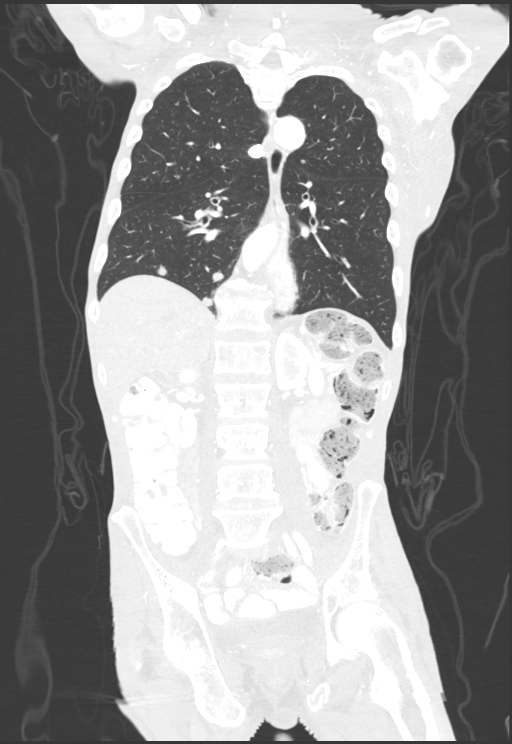

[13 of 36 positions shown; findings below may reference images not displayed]

FINDINGS: CT CHEST FINDINGS

Cardiovascular: No significant vascular findings. Normal heart size.
No pericardial effusion. Right chest port catheter.

Mediastinum/Nodes: No enlarged mediastinal, hilar, or axillary lymph
nodes. Thyroid gland, trachea, and esophagus demonstrate no
significant findings.

Lungs/Pleura: A 7 mm pulmonary nodule of the right lower lobe as
slightly increased in size, measuring 7 mm, previously 3 mm (series
6, image 82). Multiple additional small bilateral pulmonary nodules
are minimally increased in size. No pleural effusion or
pneumothorax.

Musculoskeletal: No chest wall mass.

CT ABDOMEN PELVIS FINDINGS

Hepatobiliary: No focal liver abnormality is seen. No gallstones,
gallbladder wall thickening, or biliary dilatation.

Pancreas: Status post distal pancreatectomy. No pancreatic ductal
dilatation or surrounding inflammatory changes.

Spleen: Status post splenectomy.

Adrenals/Urinary Tract: Adrenal glands are unremarkable. Kidneys are
normal, without renal calculi, focal lesion, or hydronephrosis.
Bladder is unremarkable.

Stomach/Bowel: Status post gastrectomy. Appendix appears normal. No
evidence of bowel wall thickening, distention, or inflammatory
changes.

Vascular/Lymphatic: No significant vascular findings are present.
Slight interval enlargement of a hypodense portacaval lymph node,
measuring 3.1 x 2.2 cm, previously 2.6 x 2.0 cm when measured
similarly (series 2, image 69).

Reproductive: No mass or other abnormality.

Other: No abdominal wall hernia or abnormality. No abdominopelvic
ascites.

Musculoskeletal: Redemonstrated wedge deformities of T6 and T7.
IMPRESSION: 1. A 7 mm pulmonary nodule of the right lower lobe as slightly
increased in size, measuring 7 mm, previously 3 mm (series 6, image
82). Multiple additional small bilateral pulmonary nodules are
minimally increased in size.

2. Slight interval enlargement of a hypodense portacaval lymph node,
measuring 3.1 x 2.2 cm, previously 2.6 x 2.0 cm when measured
similarly (series 2, image 69).

3.  Findings are consistent with worsening metastatic disease.

4. Unchanged postoperative findings status post gastrectomy,
splenectomy, and distal pancreatectomy.
